# Patient Record
Sex: Male | Born: 1945 | Race: White | Hispanic: No | Marital: Married | Smoking: Never smoker
Health system: Southern US, Community
[De-identification: ages and names within clinical notes are randomized; demographics above are authoritative.]

## PROBLEM LIST (undated history)

## (undated) DIAGNOSIS — I4892 Unspecified atrial flutter: Secondary | ICD-10-CM

## (undated) DIAGNOSIS — R3129 Other microscopic hematuria: Secondary | ICD-10-CM

## (undated) DIAGNOSIS — M1990 Unspecified osteoarthritis, unspecified site: Secondary | ICD-10-CM

## (undated) DIAGNOSIS — I2510 Atherosclerotic heart disease of native coronary artery without angina pectoris: Secondary | ICD-10-CM

## (undated) DIAGNOSIS — E785 Hyperlipidemia, unspecified: Secondary | ICD-10-CM

## (undated) DIAGNOSIS — H5462 Unqualified visual loss, left eye, normal vision right eye: Secondary | ICD-10-CM

## (undated) DIAGNOSIS — M25512 Pain in left shoulder: Secondary | ICD-10-CM

## (undated) DIAGNOSIS — K219 Gastro-esophageal reflux disease without esophagitis: Secondary | ICD-10-CM

## (undated) DIAGNOSIS — B019 Varicella without complication: Secondary | ICD-10-CM

## (undated) HISTORY — DX: Pain in left shoulder: M25.512

## (undated) HISTORY — DX: Unspecified osteoarthritis, unspecified site: M19.90

## (undated) HISTORY — DX: Gastro-esophageal reflux disease without esophagitis: K21.9

## (undated) HISTORY — DX: Unspecified atrial flutter: I48.92

## (undated) HISTORY — PX: COLONOSCOPY: SHX174

## (undated) HISTORY — DX: Other microscopic hematuria: R31.29

## (undated) HISTORY — DX: Varicella without complication: B01.9

## (undated) HISTORY — DX: Unqualified visual loss, left eye, normal vision right eye: H54.62

## (undated) HISTORY — DX: Hyperlipidemia, unspecified: E78.5

---
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
## 1973-12-27 HISTORY — PX: WISDOM TOOTH EXTRACTION: SHX21

## 2013-02-02 ENCOUNTER — Encounter: Payer: Self-pay | Admitting: Family Medicine

## 2013-02-02 ENCOUNTER — Ambulatory Visit (INDEPENDENT_AMBULATORY_CARE_PROVIDER_SITE_OTHER): Payer: Managed Care, Other (non HMO) | Admitting: Family Medicine

## 2013-02-02 VITALS — BP 98/70 | HR 72 | Temp 97.5°F | Ht 69.75 in | Wt 212.0 lb

## 2013-02-02 DIAGNOSIS — Q600 Renal agenesis, unilateral: Secondary | ICD-10-CM

## 2013-02-02 DIAGNOSIS — M25519 Pain in unspecified shoulder: Secondary | ICD-10-CM

## 2013-02-02 DIAGNOSIS — Z0000 Encounter for general adult medical examination without abnormal findings: Secondary | ICD-10-CM

## 2013-02-02 DIAGNOSIS — Q602 Renal agenesis, unspecified: Secondary | ICD-10-CM

## 2013-02-02 DIAGNOSIS — E785 Hyperlipidemia, unspecified: Secondary | ICD-10-CM | POA: Insufficient documentation

## 2013-02-02 DIAGNOSIS — Z7189 Other specified counseling: Secondary | ICD-10-CM

## 2013-02-02 DIAGNOSIS — M25512 Pain in left shoulder: Secondary | ICD-10-CM

## 2013-02-02 DIAGNOSIS — Z7689 Persons encountering health services in other specified circumstances: Secondary | ICD-10-CM

## 2013-02-02 DIAGNOSIS — E782 Mixed hyperlipidemia: Secondary | ICD-10-CM | POA: Insufficient documentation

## 2013-02-02 DIAGNOSIS — R3129 Other microscopic hematuria: Secondary | ICD-10-CM

## 2013-02-02 HISTORY — DX: Other microscopic hematuria: R31.29

## 2013-02-02 HISTORY — DX: Pain in left shoulder: M25.512

## 2013-02-02 NOTE — Patient Instructions (Addendum)
-  We have ordered labs or studies at this visit. It can take up to 1-2 weeks for results and processing. We will contact you with instructions IF your results are abnormal. Normal results will be released to your [facility]. If you have not heard from [place] or can not find your results in [facility] in 2 weeks please contact our office.  -PLEASE SIGN UP FOR MYCHART TODAY   We recommend the following healthy lifestyle measures: - eat a healthy diet consisting of lots of vegetables, fruits, beans, nuts, seeds, healthy meats such as white chicken and fish and whole grains.  - avoid fried foods, fast food, processed foods, sodas, red meet and other fattening foods.  - get a least 150 minutes of aerobic exercise per week.   -We placed a referral for you as discussed to the ORTHOPEDIC doctor for your shoulder and to GASTROENTEROLOGY for a SIGMOIDOSCOPY. It usually takes about 1-2 weeks to process and schedule this referral. If you have not heard from [place] regarding this appointment in 2 weeks please contact our office.   Follow up in: on Monday for a lab appointment - please schedule this, and in 4-6 months

## 2013-02-02 NOTE — Progress Notes (Signed)
Chief Complaint  Patient presents with  . Establish Care  . left shoulder pain    HPI:  Ronnie Vazquez is here to establish care. Has not had a PCP in a long time. Last PCP and physical: had a physical at [place] about 2 years ago with basic labs all looked good.  Has the following chronic problems and concerns today:  Patient Active Problem List  Diagnosis  . 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 Left shoulder pain  . Hyperlipemia  . Microscopic hematuria   L shoulder pain: -started about 3 months ago -did injure this shoulder playing football in highschool - no surgery -hurts worse with lift arm above head and with putting coat on -denies: fevers, weakness, numbness  Health Maintenance: -see health maintenance section -colon cancer screening: wants to do sigmoid  ROS: See pertinent positives and negatives per HPI.  Past Medical History  Diagnosis Date  . Chicken pox   . Hyperlipidemia   . Congenital absence of one kidney 02/02/2013  . Microscopic hematuria 02/02/2013  . Hyperlipemia 02/02/2013  . Left shoulder pain 02/02/2013    Family History  Problem Relation Age of Onset  . Prostate cancer Father     History   Social History  . Marital Status: Married    Spouse Name: N/A    Number of Children: N/A  . Years of Education: N/A   Social History Main Topics  . Smoking status: Never Smoker   . Smokeless tobacco: None  . Alcohol Use: Yes     Comment: 2-3 drinks 3-4 days per week  . Drug Use: None  . Sexually Active: None   Other Topics Concern  . 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  EXAM:  Filed Vitals:   02/02/13 0938  BP: 98/70  Pulse: 72  Temp: 97.5 F (36.4 C)    Body mass index is 30.64 kg/(m^2).  GENERAL: vitals  reviewed and listed above, alert, oriented, appears well hydrated and in no acute distress  HEENT: atraumatic, conjunttiva clear, no obvious abnormalities on inspection of external nose and ears  NECK: no obvious masses on inspection  LUNGS: clear to auscultation bilaterally, no wheezes, rales or rhonchi, good air movement  CV: HRRR, no peripheral edema  MS: moves all extremities without noticeable abnormality -normal ROM and strength in bilat , no bony or soft tissue TTP -+ impingement test, neg shawl, empty can, lift off, speeds  PSYCH: pleasant and cooperative, no obvious depression or anxiety  ASSESSMENT AND PLAN:  Discussed the following assessment and plan:  1. Congenital absence of one kidney    2. Left shoulder pain  Ambulatory referral to Orthopedic Surgery  3. Hyperlipemia    4. Microscopic hematuria  Urinalysis with Reflex Microscopic  5. Establishing care with new doctor, encounter for  Lipid Panel, Hemoglobin A1c, Basic metabolic panel, PSA, Urinalysis with Reflex Microscopic  6. Visit for preventive health examination  Ambulatory referral to Gastroenterology   -We reviewed the PMH, PSH, FH, SH, Meds and Allergies. -discussed health maintenance recs for age and sex -pt will get sigmoidoscopy - referral placed -labs on Monday - pt wants to do fasting -discussed PSA screening and he wants to do this -placed referral for ortho for his shoulder per his request -  suspect RTC tendonopathy  -Patient advised to return or notify a doctor immediately if symptoms worsen or persist or new concerns arise.  Patient Instructions  -We have ordered labs or studies at this visit. It can take up to 1-2 weeks for results and processing. We will contact you with instructions IF your results are abnormal. Normal results will be released to your [facility]. If you have not heard from [place] or can not find your results in [facility] in 2 weeks please contact our office.  -PLEASE SIGN UP FOR  MYCHART TODAY   We recommend the following healthy lifestyle measures: - eat a healthy diet consisting of lots of vegetables, fruits, beans, nuts, seeds, healthy meats such as white chicken and fish and whole grains.  - avoid fried foods, fast food, processed foods, sodas, red meet and other fattening foods.  - get a least 150 minutes of aerobic exercise per week.   -We placed a referral for you as discussed to the ORTHOPEDIC doctor for your shoulder and to GASTROENTEROLOGY for a SIGMOIDOSCOPY. It usually takes about 1-2 weeks to process and schedule this referral. If you have not heard from [place] regarding this appointment in 2 weeks please contact our office.   Follow up in: on Monday for a lab appointment - please schedule this, and in 4-6 months      KIM, HANNAH R.

## 2013-02-05 ENCOUNTER — Telehealth: Payer: Self-pay | Admitting: Family Medicine

## 2013-02-05 ENCOUNTER — Other Ambulatory Visit (INDEPENDENT_AMBULATORY_CARE_PROVIDER_SITE_OTHER): Payer: Managed Care, Other (non HMO)

## 2013-02-05 DIAGNOSIS — Z7689 Persons encountering health services in other specified circumstances: Secondary | ICD-10-CM

## 2013-02-05 DIAGNOSIS — R3129 Other microscopic hematuria: Secondary | ICD-10-CM

## 2013-02-05 DIAGNOSIS — Z7189 Other specified counseling: Secondary | ICD-10-CM

## 2013-02-05 LAB — URINALYSIS, ROUTINE W REFLEX MICROSCOPIC
Bilirubin Urine: NEGATIVE
Ketones, ur: NEGATIVE
Nitrite: NEGATIVE
Total Protein, Urine: NEGATIVE
Urine Glucose: NEGATIVE
pH: 5.5 (ref 5.0–8.0)

## 2013-02-05 LAB — HEMOGLOBIN A1C: Hgb A1c MFr Bld: 5.6 % (ref 4.6–6.5)

## 2013-02-05 LAB — LIPID PANEL
Cholesterol: 215 mg/dL — ABNORMAL HIGH (ref 0–200)
Triglycerides: 207 mg/dL — ABNORMAL HIGH (ref 0.0–149.0)

## 2013-02-05 LAB — BASIC METABOLIC PANEL
BUN: 21 mg/dL (ref 6–23)
CO2: 28 mEq/L (ref 19–32)
Chloride: 105 mEq/L (ref 96–112)
Creatinine, Ser: 1.3 mg/dL (ref 0.4–1.5)

## 2013-02-05 LAB — LDL CHOLESTEROL, DIRECT: Direct LDL: 115.5 mg/dL

## 2013-02-05 NOTE — Telephone Encounter (Signed)
Please let him know most labs look ok.  -cholesterol is a little high and I recommend regular exercise and a healthy diet -he does have a very small amount of blood in the urine - given hx hx of one kidney and this occuring in the past, I would like for him to see a urologist.  Elease Hashimoto, Please place referral to urology if this is ok with him for microscopic hematuria, absence of one kidney

## 2013-02-06 NOTE — Telephone Encounter (Signed)
Left a message for return call.  

## 2013-02-07 ENCOUNTER — Telehealth: Payer: Self-pay | Admitting: Family Medicine

## 2013-02-07 NOTE — Telephone Encounter (Signed)
Returning call to office regarding lab results

## 2013-02-12 ENCOUNTER — Other Ambulatory Visit: Payer: Self-pay | Admitting: Family Medicine

## 2013-02-12 DIAGNOSIS — R3129 Other microscopic hematuria: Secondary | ICD-10-CM

## 2013-02-12 NOTE — Telephone Encounter (Signed)
Called and spoke with pt and pt is aware of lab results.  

## 2013-02-12 NOTE — Telephone Encounter (Signed)
Called and spoke with pt and pt is aware.  Pt would like referral to urologist.

## 2013-03-13 ENCOUNTER — Encounter: Payer: Self-pay | Admitting: Family Medicine

## 2013-03-13 NOTE — Progress Notes (Signed)
Received OV notes from Dr. 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 He was offered cytoscopy for the chronic microscopic hematuria but refused. Urine cytology was ordered and pt is to follow up with urology in 6 months.

## 2013-03-19 ENCOUNTER — Encounter: Payer: Managed Care, Other (non HMO) | Admitting: Gastroenterology

## 2013-07-29 ENCOUNTER — Telehealth: Payer: Self-pay | Admitting: Family Medicine

## 2013-07-29 ENCOUNTER — Encounter (HOSPITAL_COMMUNITY): Payer: Self-pay | Admitting: Emergency Medicine

## 2013-07-29 ENCOUNTER — Emergency Department (HOSPITAL_COMMUNITY)
Admission: EM | Admit: 2013-07-29 | Discharge: 2013-07-29 | Disposition: A | Discharge status: Home / Self Care | Payer: Managed Care, Other (non HMO) | Attending: Emergency Medicine | Admitting: Emergency Medicine

## 2013-07-29 DIAGNOSIS — Z8639 Personal history of other endocrine, nutritional and metabolic disease: Secondary | ICD-10-CM | POA: Insufficient documentation

## 2013-07-29 DIAGNOSIS — Z87798 Personal history of other (corrected) congenital malformations: Secondary | ICD-10-CM | POA: Insufficient documentation

## 2013-07-29 DIAGNOSIS — Z8619 Personal history of other infectious and parasitic diseases: Secondary | ICD-10-CM | POA: Insufficient documentation

## 2013-07-29 DIAGNOSIS — R4182 Altered mental status, unspecified: Secondary | ICD-10-CM | POA: Insufficient documentation

## 2013-07-29 DIAGNOSIS — G459 Transient cerebral ischemic attack, unspecified: Secondary | ICD-10-CM

## 2013-07-29 DIAGNOSIS — Z7982 Long term (current) use of aspirin: Secondary | ICD-10-CM | POA: Insufficient documentation

## 2013-07-29 DIAGNOSIS — Z862 Personal history of diseases of the blood and blood-forming organs and certain disorders involving the immune mechanism: Secondary | ICD-10-CM | POA: Insufficient documentation

## 2013-07-29 DIAGNOSIS — H5460 Unqualified visual loss, one eye, unspecified: Secondary | ICD-10-CM | POA: Insufficient documentation

## 2013-07-29 DIAGNOSIS — H538 Other visual disturbances: Secondary | ICD-10-CM | POA: Insufficient documentation

## 2013-07-29 LAB — COMPREHENSIVE METABOLIC PANEL
ALT: 32 U/L (ref 0–53)
AST: 32 U/L (ref 0–37)
Albumin: 4.1 g/dL (ref 3.5–5.2)
Calcium: 9.8 mg/dL (ref 8.4–10.5)
Creatinine, Ser: 1.27 mg/dL (ref 0.50–1.35)
Sodium: 139 mEq/L (ref 135–145)

## 2013-07-29 LAB — CBC
Hemoglobin: 15.1 g/dL (ref 13.0–17.0)
MCHC: 36.8 g/dL — ABNORMAL HIGH (ref 30.0–36.0)
WBC: 6.7 10*3/uL (ref 4.0–10.5)

## 2013-07-29 MED ORDER — ASPIRIN 81 MG PO CHEW
324.0000 mg | CHEWABLE_TABLET | Freq: Once | ORAL | Status: AC
  Administered 2013-07-29: 324 mg via ORAL
  Filled 2013-07-29: qty 4

## 2013-07-29 NOTE — ED Provider Notes (Signed)
CSN: [id number]     Arrival date & time 07/29/13  1529 History     First MD Initiated Contact with Patient 07/29/13 1556     Chief Complaint  Patient presents with  . Dizziness  . Blurred Vision  . Altered Mental Status    HPI Pt reports yesterday with a horizon in his vision that turned to blurred vision. Pt also reports while having a conversation with someone he became disoriented.  Patient is asymptomatic today and played golf.  Symptoms lasted maybe 1-2 minutes at most.  Patient has no risk factors other than elevated cholesterol.  Patient takes no regular medication. Past Medical History  Diagnosis Date  . Chicken pox   . Hyperlipidemia   . Congenital absence of one kidney 02/02/2013  . Microscopic hematuria 02/02/2013  . Hyperlipemia 02/02/2013  . Left shoulder pain 02/02/2013   Past Surgical History  Procedure Laterality Date  . Tonsillectomy and adenoidectomy  1959   Family History  Problem Relation Age of Onset  . Prostate cancer Father    History  Substance Use Topics  . Smoking status: Never Smoker   . Smokeless tobacco: Not on file  . Alcohol Use: Yes     Comment: 2-3 drinks 3-4 days per week    Review of Systems All other systems reviewed and are negative  Allergies  Review of patient's allergies indicates no known allergies.  Home Medications   Current Outpatient Rx  Name  Route  Sig  Dispense  Refill  . aspirin EC 81 MG tablet   Oral   Take 81 mg by mouth daily.         Marland Kitchen ibuprofen (ADVIL,MOTRIN) 200 MG tablet   Oral   Take 200 mg by mouth every 6 (six) hours as needed for pain.         Marland Kitchen OVER THE COUNTER MEDICATION   Oral   Take 1 tablet by mouth daily as needed. For allergies. [employer] brand          BP 125/78  Pulse 69  Temp(Src) 98 F (36.7 C) (Oral)  Resp 20  Ht 5' 10.5" (1.791 m)  Wt 205 lb (92.987 kg)  BMI 28.99 kg/m2  SpO2 98% Physical Exam  Nursing note and vitals reviewed. Constitutional: He is oriented to person, place, and  time. He appears well-developed and well-nourished. No distress.  HENT:  Head: Normocephalic and atraumatic.  Eyes: Pupils are equal, round, and reactive to light.  Neck: Normal range of motion. Normal carotid pulses and no JVD present. Carotid bruit is not present.  Cardiovascular: Normal rate, normal heart sounds and intact distal pulses.   Pulmonary/Chest: No respiratory distress.  Abdominal: Normal appearance. He exhibits no distension.  Musculoskeletal: Normal range of motion.  Neurological: He is alert and oriented to person, place, and time. He has normal strength. No cranial nerve deficit or sensory deficit. Coordination and gait normal. GCS eye subscore is 4. GCS verbal subscore is 5. GCS motor subscore is 6.  Skin: Skin is warm and dry. No rash noted.  Psychiatric: He has a normal mood and affect. His behavior is normal.    ED Course   Procedures (including critical care time)  Date: 07/29/2013  Rate: 77  Rhythm: normal sinus rhythm  QRS Axis: normal  Intervals: normal  ST/T Wave abnormalities: normal  Conduction Disutrbances: none  Narrative Interpretation: unremarkable     Labs Reviewed  CBC - Abnormal; Notable for the following:    MCH 34.8 (*)  MCHC 36.8 (*)    All other components within normal limits  COMPREHENSIVE METABOLIC PANEL - Abnormal; Notable for the following:    Total Bilirubin 0.2 (*)    GFR calc non Af Amer 58 (*)    GFR calc Af Amer 67 (*)    All other components within normal limits   No results found. 1. TIA (transient ischemic attack)     MDM   discussed the case with neurology.  Patient has had 24 hours with no symptoms therefore outpatient GI workup would be reasonable.  Patient does not want to be admitted.  3 iron 24 mg of aspirin given by mouth and patient recommended to start daily 324 g of aspirin.  Patient structured to call his private physician tomorrow for outpatient TIA workup.  Patient structured return to emergency  department should symptoms return.    Nelia Shi, MD 07/29/13 [phone]

## 2013-07-29 NOTE — ED Notes (Signed)
Pt reports yesterday with a horizon in his vision that turned to blurred vision. Pt also reports while having a conversation with someone he became disoriented.

## 2013-07-30 ENCOUNTER — Telehealth: Payer: Self-pay | Admitting: Family Medicine

## 2013-07-30 DIAGNOSIS — G459 Transient cerebral ischemic attack, unspecified: Secondary | ICD-10-CM

## 2013-07-30 DIAGNOSIS — H579 Unspecified disorder of eye and adnexa: Secondary | ICD-10-CM

## 2013-07-30 NOTE — Telephone Encounter (Signed)
Spoke with patient.  Had episode 2 days ago for several minutes with visual disturbance in R eye and confusion and was told to go to the ED. Symptoms resolved, went to ED and reports BP was elevated initially with SBP > 140. Told needed to take asa and do work up for cause of TIA outpt. He wants to see a neurologist urgently. He is leaving town tomorrow at noon. He reports he will see his doctor today but would appreciate if we can get him in with neurologist. Have sent urgent referral to my scheduler. No further symptoms since. Advised if any further symptoms in meantime to see doctor immediately. [phone] (home)

## 2013-07-30 NOTE — Telephone Encounter (Signed)
Patient Information:  Caller Name: Ronnie Vazquez  Phone: [phone]  Patient: Ronnie Vazquez, Ronnie Vazquez  Gender: Male  DOB: 1946-01-02  Age: 67 Years  PCP: Kriste Basque [facility]  Office Follow Up:  Does the office need to follow up with this patient?: Yes  Instructions For The Office: appt workin for ED follow up krs/can  RN Note:  Triaged for weakness Needs ED follow up appt after being seen 07/29/13 for possible TIA symptoms; labwork, ECG normal.  Diagnosed with possible TIA now stable.  Patient has 15-min follow up appt 08/03/13 but wonders if he could be seen sooner due to questions he has regarding what testing, prevention, etc he needs to think about.  TC to office for assistance; follow up appt offered 07/31/13 1615 with Dr. Selena Batten; patient is going out of town to [place] and is leaving at noon that day.  Info to office for provider review/requested workin for ED follow up appt/callback.  May reach patient at [phone].  krs/can  Symptoms  Reason For Call & Symptoms: needs follow up after ED visit  Reviewed Health History In EMR: Yes  Reviewed Medications In EMR: Yes  Reviewed Allergies In EMR: Yes  Reviewed Surgeries / Procedures: Yes  Date of Onset of Symptoms: Unknown  Guideline(s) Used:  No Protocol Available - Information Only  Disposition Per Guideline:   Discuss with PCP and Callback by Nurse Today  Reason For Disposition Reached:   Nursing judgment  Advice Given:  N/A  Patient Will Follow Care Advice:  YES

## 2013-07-30 NOTE — Telephone Encounter (Deleted)
Can block two 15 minute appts tomorrow morning for ED follow up if need to.

## 2013-07-30 NOTE — Addendum Note (Signed)
Addended by: Terressa Koyanagi on: 07/30/2013 12:34 PM   Modules accepted: Orders

## 2013-07-30 NOTE — Telephone Encounter (Signed)
[facility] Triage Call Report Triage Record Num: [id number] Operator: Chevis Pretty Patient Name: Ronnie Vazquez Call Date & Time: 07/29/2013 2:47:26PM Patient Phone: [phone] PCP: Kriste Basque Patient Gender: Male PCP Fax : Patient DOB: 1946-10-09 Practice Name: [facility]  Reason for Call: Caller: [name]/Patient; PCP: Kriste Basque (Family Practice); CB#: [phone]; Call regarding Blurry vision for a few minutes, saw a bright light. Occurred 07/28/13 of what he describes as "prism of light" with blurred vision and mild disorientation which lasted only a few minutes. Denies eye pain. States no visual disturbance or other symptoms during day 07/29/13 and played golf. States he has high cholesterol, but no blood pressure problems. Takes no medications. Per eye pain/vision change protocol, advised being seen within 4 hours. Patient agrees to go to UC/ED.  Protocol(s) Used: Eye: Pain or Vision Change  Recommended Outcome per Protocol: See Provider within 4 hours  Reason for Outcome: Sudden appearance of many floaters (dark, floating shapes), halos, spots, specks, lines, or flashes of light Care Advice: ~ Another adult should drive. ~ Do not drive or operate machinery while vision is impaired. ~ Call your provider if you note increasing pain or any change in vision (such as double vision, blurred vision, etc.) ~ Call provider if there is visual field loss, sudden decrease in visual acuity, severe eye pain, or severe headache. ~ Wear dark UV-blocking sunglasses to protect eyes from sun exposure or for light sensitivity. ~ SYMPTOM / CONDITION MANAGEMENT ~ CAUTIONS ~ List, or take, all current prescription(s), nonprescription or alternative medication(s) to provider for evaluation. 08/

## 2013-07-31 ENCOUNTER — Ambulatory Visit: Payer: Managed Care, Other (non HMO) | Admitting: Neurology

## 2013-07-31 ENCOUNTER — Ambulatory Visit (INDEPENDENT_AMBULATORY_CARE_PROVIDER_SITE_OTHER): Payer: Managed Care, Other (non HMO) | Admitting: Neurology

## 2013-07-31 ENCOUNTER — Encounter: Payer: Self-pay | Admitting: Neurology

## 2013-07-31 VITALS — BP 118/70 | HR 76 | Temp 97.5°F | Ht 70.5 in | Wt 214.0 lb

## 2013-07-31 DIAGNOSIS — H539 Unspecified visual disturbance: Secondary | ICD-10-CM

## 2013-07-31 DIAGNOSIS — G459 Transient cerebral ischemic attack, unspecified: Secondary | ICD-10-CM

## 2013-07-31 NOTE — Patient Instructions (Addendum)
A mini stroke or TIA is possible, but the type of visual disturbance (ring of light) is unusual presentation. The memory problem may have been preoccupation with this visual disturbance.  However, we should check a couple of tests to make sure there isn't anything else to do other than aspirin to help prevent a stroke.  The other possibility could have been a retinal issue with the eye. 1.  Continue the aspirin 2.  We will check a carotid doppler 3.  We will check an echocardiogram 4.  Follow up with ophthalmology.  Your carotid doppler study is scheduled on Monday, August 11th at 2:00 pm.  Your 2 D Echo is scheduled on Thursday, August 14th at 9:30 am. Please arrive 15 minutes early for both appointments. Both tests will be performed at [facility] located at [address] in [place].  [phone]

## 2013-07-31 NOTE — Progress Notes (Signed)
NEUROLOGY CONSULTATION NOTE  Ronnie Vazquez MRN: [id number] DOB: 1945-12-30  Referring provider: Dr. Selena Batten Primary care provider: Dr. Selena Batten  Reason for consult:  Transient episode of visual disturbance and memory problem.  Questionable TIA.  HISTORY OF PRESENT ILLNESS: Ronnie Vazquez is a 67 y.o. Male with no significant past medical history, who presents with transient episode of right visual disturbance and memory problems.  Records and images were personally reviewed where available.    On 07/28/13, he was eating lunch with friends when he suddenly noted a ring of light in the visual field of his right eye, which was associated with blurriness. There was no darkening of vision like a shade coming down. He denied any numbness or tingling or slurred speech. He was talking about [place] with his friends. During this period, he was unable to remember the names of cities in [place]. It lasted only about a minute and completely resolved.  He presented to the ED the following day.  Blood pressure was 143/94, but later was 117/75.  No imaging was performed.  Admission for TIA workup was proposed, but he did not want to be admitted for further workup.  He was told to increase his ASA from 81mg  to 325mg  daily and follow up with his PCP.  At baseline, he has vision loss in his left eye.  Labs (02/05/13):  LDL 41.4, Hgb A1c 5.6.  PAST MEDICAL HISTORY: Past Medical History  Diagnosis Date  . Chicken pox   . Hyperlipidemia   . Congenital absence of one kidney 02/02/2013  . Microscopic hematuria 02/02/2013  . Hyperlipemia 02/02/2013  . Left shoulder pain 02/02/2013  . Vision loss of left eye     PAST SURGICAL HISTORY: Past Surgical History  Procedure Laterality Date  . Tonsillectomy and adenoidectomy  1959    MEDICATIONS: Current Outpatient Prescriptions on File Prior to Visit  Medication Sig Dispense Refill  . ibuprofen (ADVIL,MOTRIN) 200 MG tablet Take 200 mg by mouth every 6 (six) hours as needed for  pain.      Marland Kitchen OVER THE COUNTER MEDICATION Take 1 tablet by mouth daily as needed. For allergies. [employer] brand       No current facility-administered medications on file prior to visit.    ALLERGIES: No Known Allergies  FAMILY HISTORY: Family History  Problem Relation Age of Onset  . Prostate cancer Father     SOCIAL HISTORY: History   Social History  . Marital Status: Married    Spouse Name: N/A    Number of Children: N/A  . Years of Education: N/A   Occupational History  . Not on file.   Social History Main Topics  . Smoking status: Never Smoker   . Smokeless tobacco: Never Used  . Alcohol Use: Yes     Comment: 2-3 drinks 3-4 days per week  . Drug Use: No  . Sexually Active: Not on file   Other Topics Concern  . 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 Situation: lives with wife      Spiritual Beliefs: none      Lifestyle: exercises 3 times per week                REVIEW OF SYSTEMS: Constitutional: No fevers, chills, or sweats, no generalized fatigue, change in appetite Eyes: No visual changes, double vision, eye pain Ear, nose and throat: No hearing loss, ear  pain, nasal congestion, sore throat Cardiovascular: No chest pain, palpitations Respiratory:  No shortness of breath at rest or with exertion, wheezes GastrointestinaI: No nausea, vomiting, diarrhea, abdominal pain, fecal incontinence Genitourinary:  No dysuria, urinary retention or frequency Musculoskeletal:  Shoulder pain Integumentary: No rash, pruritus, skin lesions Neurological: as above Psychiatric: No depression, insomnia, anxiety Endocrine: No palpitations, fatigue, diaphoresis, mood swings, change in appetite, change in weight, increased thirst Hematologic/Lymphatic:  No anemia, purpura, petechiae. Allergic/Immunologic: no itchy/runny eyes, nasal congestion, recent allergic reactions, rashes  PHYSICAL  EXAM: Filed Vitals:   07/31/13 1237  BP: 118/70  Pulse: 76  Temp: 97.5 F (36.4 C)   General: No acute distress Head:  Normocephalic/atraumatic Neck: supple, no paraspinal tenderness, full range of motion Back: No paraspinal tenderness Heart: regular rate and rhythm Lungs: Clear to auscultation bilaterally. Vascular: No carotid bruits. Neurological Exam: Mental status: alert and oriented to person, place, time and self, speech fluent and not dysarthric, language intact. Cranial nerves: CN I: not tested CN II: right eye round and reactive to light, left pupil nonreactive, visual fields intact, fundi unremarkable. CN III, IV, VI:  full range of motion, no nystagmus, no ptosis CN V: facial sensation intact CN VII: upper and lower face symmetric CN VIII: hearing intact CN IX, X: gag intact, uvula midline CN XI: sternocleidomastoid and trapezius muscles intact CN XII: tongue midline Bulk & Tone: normal, no fasciculations. Motor: 5/5 throughout Sensation: temperature and vibration intact Deep Tendon Reflexes: 2+ throughout, toes down Finger to nose testing: Normal without dysmetria Gait: Normal stance and stride, able to walk in tandem. Romberg negative.  IMPRESSION & PLAN: Ronnie Vazquez is a 67 y.o. male with brief episode of right eye visual disturbance and memory problems.  I am not really convinced that he had a TIA.  The visual disturbance he describes is not characteristic for amaurosis fugax.  The difficulty recalling the cities in [place] may have been due to preoccupation with this visual disturbance, rather than organic memory deficits.  The description of his visual disturbance sounds like it may be due to his retina.  Nevertheless, we will check a couple of tests in an effort to optimize secondary stroke prevention. 1.  2D Echo and carotid dopplers 2.  Continue ASA. 3.  Follow up with ophthalmology, as scheduled for Monday. 4.  Further workup pending results of above  test.  Otherwise, no further neurological workup or follow up warranted.  45 minutes spent with patient, over 50% spent counseling and coordinating care.  Thank you for allowing me to take part in the care of this patient.  Shon Millet, DO  CC:  Kriste Basque, MD

## 2013-08-03 ENCOUNTER — Ambulatory Visit: Payer: Managed Care, Other (non HMO) | Admitting: Family Medicine

## 2013-08-06 ENCOUNTER — Encounter (INDEPENDENT_AMBULATORY_CARE_PROVIDER_SITE_OTHER): Payer: Managed Care, Other (non HMO)

## 2013-08-06 DIAGNOSIS — I6529 Occlusion and stenosis of unspecified carotid artery: Secondary | ICD-10-CM

## 2013-08-06 DIAGNOSIS — H53129 Transient visual loss, unspecified eye: Secondary | ICD-10-CM

## 2013-08-06 DIAGNOSIS — H539 Unspecified visual disturbance: Secondary | ICD-10-CM

## 2013-08-06 DIAGNOSIS — G459 Transient cerebral ischemic attack, unspecified: Secondary | ICD-10-CM

## 2013-08-07 ENCOUNTER — Telehealth: Payer: Self-pay | Admitting: Neurology

## 2013-08-07 NOTE — Telephone Encounter (Signed)
Message copied by Benay Spice on Tue Aug 07, 2013  3:22 PM ------      Message from: JAFFE, ADAM R      Created: Tue Aug 07, 2013  1:20 PM       Please let [name] know that his carotid dopplers look okay.  There is mild plaque build up in carotid arteries, but nothing significant.      AJ      ----- Message -----         From: Wendall Stade, MD         Sent: 08/07/2013  12:54 PM           To: Cira Servant, DO                   ------

## 2013-08-07 NOTE — Telephone Encounter (Signed)
Left the patient a message stating his carotid doppler study was okay with only very mild plaque noted; not significant. Asked that he call if questions.

## 2013-08-09 ENCOUNTER — Other Ambulatory Visit (HOSPITAL_COMMUNITY): Payer: Managed Care, Other (non HMO)

## 2013-08-13 ENCOUNTER — Ambulatory Visit (HOSPITAL_COMMUNITY): Payer: Managed Care, Other (non HMO) | Attending: Neurology

## 2013-08-13 DIAGNOSIS — E785 Hyperlipidemia, unspecified: Secondary | ICD-10-CM | POA: Insufficient documentation

## 2013-08-13 DIAGNOSIS — G459 Transient cerebral ischemic attack, unspecified: Secondary | ICD-10-CM

## 2013-08-13 NOTE — Progress Notes (Signed)
Echocardiogram performed.  

## 2013-08-14 ENCOUNTER — Telehealth: Payer: Self-pay | Admitting: Neurology

## 2013-08-14 NOTE — Telephone Encounter (Signed)
Left the patient a vm message that 2D Echo was normal; no significant findings. Asked that he call if questions.

## 2013-08-14 NOTE — Telephone Encounter (Signed)
Message copied by Benay Spice on Tue Aug 14, 2013  8:41 AM ------      Message from: JAFFE, ADAM R      Created: Mon Aug 13, 2013  2:13 PM       Please let Mr. Wenrich know that his 2D Echo looks okay.      AJ      ----- Message -----         From: [facility]         Sent: 08/13/2013   2:06 PM           To: Cira Servant, DO                   ------

## 2013-08-24 ENCOUNTER — Encounter: Payer: Self-pay | Admitting: Family Medicine

## 2013-08-24 ENCOUNTER — Ambulatory Visit (INDEPENDENT_AMBULATORY_CARE_PROVIDER_SITE_OTHER): Payer: Managed Care, Other (non HMO) | Admitting: Family Medicine

## 2013-08-24 VITALS — BP 104/70 | Temp 98.2°F | Wt 210.0 lb

## 2013-08-24 DIAGNOSIS — Z23 Encounter for immunization: Secondary | ICD-10-CM

## 2013-08-24 DIAGNOSIS — Z2911 Encounter for prophylactic immunotherapy for respiratory syncytial virus (RSV): Secondary | ICD-10-CM

## 2013-08-24 DIAGNOSIS — R3129 Other microscopic hematuria: Secondary | ICD-10-CM

## 2013-08-24 DIAGNOSIS — Z1211 Encounter for screening for malignant neoplasm of colon: Secondary | ICD-10-CM

## 2013-08-24 NOTE — Progress Notes (Addendum)
Chief Complaint  Patient presents with  . 6 month follow up    HPI:  Follow up:  1)Transient los of vision: -saw neurology and had echo and carotid dopplers which were ok per phone notes and not thought to be TIA, advised to continue asa which he has done -saw optho and was fine too -no symptoms since  2)microscopic hematuria: -saw urology, CT ok, cystoscopy was advised but deferred by pt -pt was to follow up with urology in 6 months per review of notes  Health maintenance: -due for flu and shingles vaccines -referred for sigmoidoscopy - he cancelled sigmoid and is interested in colo vantage or stool cards yearly  ROS: See pertinent positives and negatives per HPI.  Past Medical History  Diagnosis Date  . Chicken pox   . Hyperlipidemia   . Microscopic hematuria 02/02/2013  . Hyperlipemia 02/02/2013  . Left shoulder pain 02/02/2013  . Vision loss of left eye     Past Surgical History  Procedure Laterality Date  . Tonsillectomy and adenoidectomy  1959    Family History  Problem Relation Age of Onset  . Prostate cancer Father     History   Social History  . Marital Status: Married    Spouse Name: N/A    Number of Children: N/A  . Years of Education: N/A   Social History Main Topics  . Smoking status: Never Smoker   . Smokeless tobacco: Never Used  . Alcohol Use: Yes     Comment: 2-3 drinks 3-4 days per week  . Drug Use: No  . Sexual Activity: Not on file   Other Topics Concern  . 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 For allergies. [employer] brand, Disp: , Rfl:   EXAM:  Filed Vitals:   08/24/13 1521  BP: 104/70  Temp: 98.2 F (36.8 C)    Body mass index is 30.69 kg/(m^2).  GENERAL: vitals reviewed and listed above, alert, oriented, appears well hydrated and in no acute distress  HEENT: atraumatic, conjunttiva clear, no obvious abnormalities on inspection of external nose and ears  NECK: no obvious masses on inspection  LUNGS: clear to auscultation bilaterally, no wheezes, rales or rhonchi, good air movement  CV: HRRR, no peripheral edema  MS: moves all extremities without noticeable abnormality  PSYCH: pleasant and cooperative, no obvious depression or anxiety  ASSESSMENT AND PLAN:  Discussed the following assessment and plan:  Colon cancer screening  Microscopic hematuria, evaluated by urology 02/2013  -he reports has follow up with urology -he refuses colonoscopy or sigmoidoscopy, discussed other options including colovantage and he agrees to stool cards -he refuses flu vaccine -agrees to shingles vaccine - given today -Patient advised to return or notify a doctor immediately if symptoms worsen or persist or new concerns arise.  Patient Instructions  -follow up with urologist  -stool cards given  today  -shingles vaccine given today  -follow up in 1 year or as needed     KIM, HANNAH R.

## 2013-08-24 NOTE — Addendum Note (Signed)
Addended by: Azucena Freed on: 08/24/2013 04:05 PM   Modules accepted: Orders

## 2013-08-24 NOTE — Patient Instructions (Signed)
-  follow up with urologist  -stool cards given today  -shingles vaccine given today  -follow up in 1 year or as needed

## 2013-08-29 ENCOUNTER — Ambulatory Visit (INDEPENDENT_AMBULATORY_CARE_PROVIDER_SITE_OTHER): Payer: Managed Care, Other (non HMO) | Admitting: Family Medicine

## 2013-08-29 ENCOUNTER — Encounter: Payer: Self-pay | Admitting: Family Medicine

## 2013-08-29 VITALS — BP 94/60 | Temp 98.2°F | Wt 210.0 lb

## 2013-08-29 DIAGNOSIS — J329 Chronic sinusitis, unspecified: Secondary | ICD-10-CM

## 2013-08-29 MED ORDER — AMOXICILLIN 875 MG PO TABS: 875.0000 mg | ORAL_TABLET | Freq: Two times a day (BID) | ORAL | Status: DC

## 2013-08-29 NOTE — Progress Notes (Signed)
Chief Complaint  Patient presents with  . sinus pressure    and pain; weakness, joint pain, mucus- brownish color     HPI:  Rad Gramling is here for an acute visit for URI: -started: 6 days ago -symptoms:nasal congestion , drainage, sore throat, sweats, malaise, ear and sinus pain -denies:fevers, NVD -wife with same symptoms -has tried: he took some ampicillin for last 3 days and now feeling better  ROS: See pertinent positives and negatives per HPI.  Past Medical History  Diagnosis Date  . Chicken pox   . Hyperlipidemia   . Microscopic hematuria 02/02/2013  . Hyperlipemia 02/02/2013  . Left shoulder pain 02/02/2013  . Vision loss of left eye     Past Surgical History  Procedure Laterality Date  . Tonsillectomy and adenoidectomy  1959    Family History  Problem Relation Age of Onset  . Prostate cancer Father     History   Social History  . Marital Status: Married    Spouse Name: N/A    Number of Children: N/A  . Years of Education: N/A   Social History Main Topics  . Smoking status: Never Smoker   . Smokeless tobacco: Never Used  . Alcohol Use: Yes     Comment: 2-3 drinks 3-4 days per week  . Drug Use: No  . Sexual Activity: None   Other Topics Concern  . 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 For allergies. [employer] brand, Disp: , Rfl: ;  amoxicillin (AMOXIL) 875 MG tablet, Take 1 tablet (875 mg total) by mouth 2 (two) times daily., Disp: 14 tablet, Rfl: 0  EXAM:  Filed Vitals:   08/29/13  1104  BP: 94/60  Temp: 98.2 F (36.8 C)    Body mass index is 29.7 kg/(m^2).  GENERAL: vitals reviewed and listed above, alert, oriented, appears well hydrated and in no acute distress  HEENT: atraumatic, conjunttiva clear, no obvious abnormalities on inspection of external nose and ears  NECK: no obvious masses on inspection  LUNGS: clear to auscultation bilaterally, no wheezes, rales or rhonchi, good air movement  CV: HRRR, no peripheral edema  MS: moves all extremities without noticeable abnormality  PSYCH: pleasant and cooperative, no obvious depression or anxiety  ASSESSMENT AND PLAN:  Discussed the following assessment and plan:  Sinusitis - Plan: amoxicillin (AMOXIL) 875 MG tablet  -seems to be improving, but getting ready to go on trip - abx given in case worsens - risks discussed -Patient advised to return or notify a doctor immediately if symptoms worsen or persist or new concerns arise.  There are no Patient Instructions on file for this visit.   Kriste Basque R.

## 2013-08-29 NOTE — Patient Instructions (Addendum)

## 2013-10-16 ENCOUNTER — Ambulatory Visit (INDEPENDENT_AMBULATORY_CARE_PROVIDER_SITE_OTHER): Payer: Managed Care, Other (non HMO) | Admitting: Family Medicine

## 2013-10-16 ENCOUNTER — Encounter: Payer: Self-pay | Admitting: Family Medicine

## 2013-10-16 VITALS — BP 118/80 | HR 70 | Temp 98.5°F | Wt 212.0 lb

## 2013-10-16 DIAGNOSIS — J209 Acute bronchitis, unspecified: Secondary | ICD-10-CM

## 2013-10-16 MED ORDER — AZITHROMYCIN 250 MG PO TABS: ORAL_TABLET | ORAL | Status: DC

## 2013-10-16 NOTE — Progress Notes (Signed)
  Subjective:    Patient ID: Ronnie Vazquez, male    DOB: 1946/03/17, 67 y.o.   MRN: [id number]  HPI Here for continued URI symptoms that started about 7 weeks ago. He was seen on 08-29-13 and was given a rx of Amoxicillin, but he never got this filled. He has had sinus pressure, PND, chest congestion, and is now coughing up green sputum. No fever. His wife has similar sx.    Review of Systems  Constitutional: Negative.   HENT: Positive for congestion and postnasal drip.   Eyes: Negative.   Respiratory: Positive for cough and chest tightness. Negative for apnea, shortness of breath and wheezing.        Objective:   Physical Exam  Constitutional: He appears well-developed and well-nourished.  HENT:  Right Ear: External ear normal.  Left Ear: External ear normal.  Nose: Nose normal.  Mouth/Throat: Oropharynx is clear and moist.  Eyes: Conjunctivae are normal.  Pulmonary/Chest: Effort normal. No respiratory distress. He has no wheezes. He has no rales.  Scattered rhonchi   Lymphadenopathy:    He has no cervical adenopathy.          Assessment & Plan:  Try a Zpack and Mucinex.

## 2014-01-03 ENCOUNTER — Encounter: Payer: Self-pay | Admitting: Family Medicine

## 2014-01-03 NOTE — Progress Notes (Signed)
Received notes from [facility] from visit on 12/26/2013.  Pt seen for abnormal urine cytology, microscopic hematuria, renal hypoplasia, unilateral, and urinary urgency.  Atypical urothelial cells are present, RBC's present.  On CT Abdomen an Pelvis w/o contrast:  2 mm nonobstructive calculus in lower pole collecting system of the right kidney.  Small 1.5 cm parapelvic cyst in the upper pole collecting system of the left kidney, colonic diverticulosis without findings to suggest acute diverticulitis at this time.  Physician to get a cytology with reflex and if the Jenkins is positive, he will insiston cystoscopy.  If the FISH is negative, pt will return in 6 months for a UA.  Sent to scan.

## 2014-03-25 ENCOUNTER — Telehealth: Payer: Self-pay | Admitting: Family Medicine

## 2014-03-25 NOTE — Telephone Encounter (Signed)
Can see [place] or derm no referral needed for derm - you can give him numbers, but we are happy to see him for this.

## 2014-03-25 NOTE — Telephone Encounter (Signed)
Pt states he has 2 dry spots on his arm/wrist and feels they may need to be looked at. Pt doesn't know any derm in [place], do you want to see pt first, or refer?

## 2014-03-25 NOTE — Telephone Encounter (Signed)
Called and left a message for pt to return call.  

## 2014-03-26 NOTE — Telephone Encounter (Signed)
Spoke with pt's wife and she is aware of Dermatology offices in [place] and was given the information.

## 2014-06-26 ENCOUNTER — Encounter: Payer: Self-pay | Admitting: *Deleted

## 2014-08-27 ENCOUNTER — Other Ambulatory Visit (HOSPITAL_COMMUNITY): Payer: Self-pay | Admitting: Cardiology

## 2014-08-27 DIAGNOSIS — I6529 Occlusion and stenosis of unspecified carotid artery: Secondary | ICD-10-CM

## 2014-08-30 ENCOUNTER — Ambulatory Visit (HOSPITAL_COMMUNITY): Payer: Managed Care, Other (non HMO) | Attending: Cardiology | Admitting: Radiology

## 2014-08-30 DIAGNOSIS — I6529 Occlusion and stenosis of unspecified carotid artery: Secondary | ICD-10-CM

## 2014-08-30 DIAGNOSIS — I658 Occlusion and stenosis of other precerebral arteries: Secondary | ICD-10-CM | POA: Diagnosis not present

## 2014-08-30 DIAGNOSIS — Z8673 Personal history of transient ischemic attack (TIA), and cerebral infarction without residual deficits: Secondary | ICD-10-CM | POA: Diagnosis not present

## 2014-08-30 DIAGNOSIS — E785 Hyperlipidemia, unspecified: Secondary | ICD-10-CM | POA: Insufficient documentation

## 2014-08-30 NOTE — Progress Notes (Signed)
Carotid Duplex performed. 

## 2015-08-29 ENCOUNTER — Telehealth: Payer: Self-pay | Admitting: Family Medicine

## 2015-08-29 NOTE — Telephone Encounter (Signed)
Pt not seen in over 2 yrs. Needs cpe . The first available is Tues 12/23/15 for fasting cpe.  Pt will be traveling that week. Pt states appt must be on a Monday or Friday b/c he travels during the week.  Is it ok to work in first of December?

## 2015-08-29 NOTE — Telephone Encounter (Signed)
Ok

## 2015-09-03 NOTE — Telephone Encounter (Signed)
Lm on vm to cb °

## 2015-09-25 NOTE — Telephone Encounter (Signed)
Pt has been scheduled.  °

## 2015-12-01 ENCOUNTER — Encounter: Payer: Self-pay | Admitting: Family Medicine

## 2015-12-01 ENCOUNTER — Ambulatory Visit (INDEPENDENT_AMBULATORY_CARE_PROVIDER_SITE_OTHER): Payer: Managed Care, Other (non HMO) | Admitting: Family Medicine

## 2015-12-01 VITALS — BP 118/80 | HR 68 | Temp 98.1°F | Ht 69.75 in | Wt 203.3 lb

## 2015-12-01 DIAGNOSIS — E785 Hyperlipidemia, unspecified: Secondary | ICD-10-CM

## 2015-12-01 DIAGNOSIS — R3129 Other microscopic hematuria: Secondary | ICD-10-CM | POA: Diagnosis not present

## 2015-12-01 DIAGNOSIS — Z0000 Encounter for general adult medical examination without abnormal findings: Secondary | ICD-10-CM

## 2015-12-01 DIAGNOSIS — I6523 Occlusion and stenosis of bilateral carotid arteries: Secondary | ICD-10-CM | POA: Diagnosis not present

## 2015-12-01 DIAGNOSIS — G459 Transient cerebral ischemic attack, unspecified: Secondary | ICD-10-CM

## 2015-12-01 DIAGNOSIS — Z23 Encounter for immunization: Secondary | ICD-10-CM

## 2015-12-01 LAB — URINALYSIS, ROUTINE W REFLEX MICROSCOPIC
BILIRUBIN URINE: NEGATIVE
KETONES UR: NEGATIVE
Leukocytes, UA: NEGATIVE
Nitrite: NEGATIVE
SPECIFIC GRAVITY, URINE: 1.02 (ref 1.000–1.030)
Total Protein, Urine: NEGATIVE
UROBILINOGEN UA: 0.2 (ref 0.0–1.0)
Urine Glucose: NEGATIVE
WBC UA: NONE SEEN (ref 0–?)
pH: 5.5 (ref 5.0–8.0)

## 2015-12-01 LAB — LDL CHOLESTEROL, DIRECT: LDL DIRECT: 128 mg/dL

## 2015-12-01 LAB — LIPID PANEL
Cholesterol: 233 mg/dL — ABNORMAL HIGH (ref 0–200)
HDL: 41.3 mg/dL (ref >39.00)
NONHDL: 191.52
Total CHOL/HDL Ratio: 6
Triglycerides: 259 mg/dL — ABNORMAL HIGH (ref 0.0–149.0)
VLDL: 51.8 mg/dL — ABNORMAL HIGH (ref 0.0–40.0)

## 2015-12-01 LAB — COMPLETE METABOLIC PANEL WITH GFR
ALT: 24 U/L (ref 9–46)
AST: 27 U/L (ref 10–35)
Albumin: 4.3 g/dL (ref 3.6–5.1)
Alkaline Phosphatase: 36 U/L — ABNORMAL LOW (ref 40–115)
BUN: 24 mg/dL (ref 7–25)
CHLORIDE: 102 mmol/L (ref 98–110)
CO2: 26 mmol/L (ref 20–31)
CREATININE: 1.23 mg/dL (ref 0.70–1.25)
Calcium: 9.5 mg/dL (ref 8.6–10.3)
GFR, EST AFRICAN AMERICAN: 70 mL/min (ref >=60)
GFR, Est Non African American: 60 mL/min (ref >=60)
GLUCOSE: 85 mg/dL (ref 65–99)
Potassium: 4.4 mmol/L (ref 3.5–5.3)
SODIUM: 136 mmol/L (ref 135–146)
Total Bilirubin: 0.5 mg/dL (ref 0.2–1.2)
Total Protein: 6.7 g/dL (ref 6.1–8.1)

## 2015-12-01 LAB — HEMOGLOBIN A1C: HEMOGLOBIN A1C: 5.5 % (ref 4.6–6.5)

## 2015-12-01 NOTE — Addendum Note (Signed)
Addended by: Agnes Lawrence on: 12/01/2015 10:04 AM   Modules accepted: Orders

## 2015-12-01 NOTE — Progress Notes (Signed)
Pre visit review using our clinic review tool, if applicable. No additional management support is needed unless otherwise documented below in the visit note. 

## 2015-12-01 NOTE — Progress Notes (Addendum)
Ronnie Vazquez is a pleasant 69 yo, here for his annual wellness preventive care visit. He has not been in for a visit in several years. He reports: Chronic medical problems:  Hx microscopic Hematuria: -Urology mananging -reports: wants to check urine micro here, reports longstanding microscopic hematuria his whole life -denies:flank pain, gross hematuria, voiding issues  Hx ? TIA/corotid art stenosis: -remote, brief visual changes -extensive eval at the time with optho and neurologist -on asa daily since -saw neurologist, reports was supposed to get repeat carotid dopplers but had to cancel, agrees to reschedule -denies HA, vision changes (saw optho recently per his report), CP, SOB, dizziness  Mild HLD: -lifestyle: exercises 3 days per week, diet so so -denies: CP, SOB, DOE   ROS: negative for report of fevers, unintentional weight loss, vision changes, vision loss, hearing loss or change, chest pain, sob, hemoptysis, melena, hematochezia, hematuria, genital discharge or lesions, falls, bleeding or bruising, loc, thoughts of suicide or self harm, memory loss  1.) Patient-completed health risk assessment  - completed and reviewed, see scanned documentation  2.) Review of Medical History: -PMH, PSH, Family History and current specialty and care providers reviewed and updated and listed below  - see scanned in document in chart and below  Past Medical History  Diagnosis Date  . Chicken pox   . Hyperlipidemia   . Microscopic hematuria 02/02/2013  . Hyperlipemia 02/02/2013  . Left shoulder pain 02/02/2013  . Vision loss of left eye     Past Surgical History  Procedure Laterality Date  . Tonsillectomy and adenoidectomy  1959    Social History   Social History  . Marital Status: Married    Spouse Name: N/A  . Number of Children: N/A  . Years of Education: N/A   Occupational History  . Not on file.   Social History Main Topics  . Smoking status: Never Smoker   . Smokeless  tobacco: Never Used  . Alcohol Use: Yes     Comment: 2-3 drinks 3-4 days per week  . Drug Use: No  . Sexual Activity: Not on file   Other Topics Concern  . 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 Prostate cancer Father     Current Outpatient Prescriptions on File Prior to Visit  Medication Sig Dispense Refill  . ibuprofen (ADVIL,MOTRIN) 200 MG tablet Take 200 mg by mouth every 6 (six) hours as needed for pain.    Marland Kitchen OVER THE COUNTER MEDICATION Take 1 tablet by mouth daily as needed. For allergies. [employer] brand     No current facility-administered medications on file prior to visit.     3.) Review of functional ability and level of safety:  Any difficulty hearing?  NO  History of falling? YES  - x1 tripped, mechanical, no injury  Any trouble with IADLs - using a phone, using transportation, grocery shopping, preparing meals, doing housework, doing laundry, taking medications and managing money? NO  Advance Directives? NO  See summary of recommendations in Patient Instructions below.  4.) Physical Exam Filed Vitals:   12/01/15 0858  BP: 118/80  Pulse: 68  Temp: 98.1 F (36.7 C)   Estimated body mass index is 29.37 kg/(m^2) as calculated from the following:   Height as of this encounter: 5' 9.75" (1.772 m).   Weight as of this encounter: 203 lb 4.8 oz (92.216 kg).  EKG (optional): deferred  General: alert, appear well hydrated and in no acute distress  HEENT: visual acuity grossly intact  NECK: no carotid bruit, no JVD  CV: HRRR, no peripheral edema  Lungs: CTA bilaterally  Psych: pleasant and cooperative, no obvious depression or anxiety  Cognitive function grossly intact  See patient instructions for  recommendations.  Education and counseling regarding the above review of health provided with a plan for the following: -see scanned patient completed form for further details -fall prevention strategies discussed  -healthy lifestyle discussed -importance and resources for completing advanced directives discussed -see patient instructions below for any other recommendations provided  4)The following written screening schedule of preventive measures were reviewed with assessment and plan made per below, orders and patient instructions:      AAA screening n/a      Alcohol screening done     Obesity Screening and counseling done     STI screening (Hep C if born 35-65) declined     Tobacco Screening done       Pneumococcal (PPSV23 -one dose after 64, one before if risk factors), influenza yearly and hepatitis B vaccines (if high risk - end stage renal disease, IV drugs, homosexual men, live in home for mentally retarded, hemophilia receiving factors) ASSESSMENT/PLAN: prevnar 1 today      Screening mammograph (yearly if >40) ASSESSMENT/PLAN: n/a      Screening Pap smear/pelvic exam (q2 years) ASSESSMENT/PLAN: n/a      Prostate cancer screening ASSESSMENT/PLAN: sees urology      Colorectal cancer screening (FOBT yearly or flex sig q4y or colonoscopy q10y or barium enema q4y) ASSESSMENT/PLAN: declined all options other then stool cards - given      Diabetes outpatient self-management training services ASSESSMENT/PLAN: n/a      Bone mass measurements(covered q2y if indicated - estrogen def, osteoporosis, hyperparathyroid, vertebral abnormalities, osteoporosis or steroids) ASSESSMENT/PLAN: n/a      Screening for glaucoma(q1y if high risk - diabetes, FH, AA and > 50 or hispanic and > 65) ASSESSMENT/PLAN: sees optho yearly      Medical nutritional therapy for individuals with diabetes or renal disease ASSESSMENT/PLAN: n/a      Cardiovascular screening blood tests (lipids  q5y) ASSESSMENT/PLAN: today      Diabetes screening tests ASSESSMENT/PLAN: today   7.) Summary:  Annual Preventive care visit: -risk factors and conditions per above assessment were discussed and treatment, recommendations and referrals were offered per documentation above and orders and patient instructions.  Hyperlipemia - Plan: Lipid Panel, CMP with eGFR -advised statin if elevated given history  Microscopic hematuria, evaluated by urology 02/2013 - Plan: Urinalysis with Reflex Microscopic -advised follow up with urologist, agreed to check urine today  Transient cerebral ischemia, unspecified transient cerebral ischemia type -? Of, no symptoms, advised he repeat carotid duplex and he agreed to reschedule -advised consideration statin if cholesterol elevated  Carotid artery stenosis, bilateral - 1-39% in 2015 - Plan: Lipid Panel, Hemoglobin A1c, CMP with eGFR -see above  Patient Instructions  BEFORE YOU LEAVE: -prevnar 13 and flu vaccines -labs -follow up in 1 year  -We have ordered labs or studies at this visit. It can take up to 1-2 weeks for results and processing. We will contact you  with instructions IF your results are abnormal. Normal results will be released to your [facility]. If you have not heard from [place] or can not find your results in [facility] in 2 weeks please contact our office.  We recommend the following healthy lifestyle measures: - eat a healthy whole foods diet consisting of regular small meals composed of vegetables, fruits, beans, nuts, seeds, healthy meats such as white chicken and fish and whole grains.  - avoid sweets, white starchy foods, fried foods, fast food, processed foods, sodas, red meet and other fattening foods.  - get a least 150-300 minutes of aerobic exercise per week.   Schedule carotid artery follow up exam  Please see a lawyer and/or go to this website to help you with advanced directives and designating a health care power of attorney so  that your wishes will be followed should you become too ill to make your own medical decisions.  [email or web address]  Complete the stool cards and return

## 2015-12-01 NOTE — Patient Instructions (Addendum)
BEFORE YOU LEAVE: -prevnar 13 and flu vaccines -labs -follow up in 1 year  -We have ordered labs or studies at this visit. It can take up to 1-2 weeks for results and processing. We will contact you with instructions IF your results are abnormal. Normal results will be released to your [facility]. If you have not heard from [place] or can not find your results in [facility] in 2 weeks please contact our office.  We recommend the following healthy lifestyle measures: - eat a healthy whole foods diet consisting of regular small meals composed of vegetables, fruits, beans, nuts, seeds, healthy meats such as white chicken and fish and whole grains.  - avoid sweets, white starchy foods, fried foods, fast food, processed foods, sodas, red meet and other fattening foods.  - get a least 150-300 minutes of aerobic exercise per week.   Schedule carotid artery follow up exam  Please see a lawyer and/or go to this website to help you with advanced directives and designating a health care power of attorney so that your wishes will be followed should you become too ill to make your own medical decisions.  [email or web address]  Complete the stool cards and return

## 2015-12-23 ENCOUNTER — Encounter: Payer: Managed Care, Other (non HMO) | Admitting: Family Medicine

## 2016-04-05 ENCOUNTER — Ambulatory Visit (INDEPENDENT_AMBULATORY_CARE_PROVIDER_SITE_OTHER): Payer: Managed Care, Other (non HMO) | Admitting: Family Medicine

## 2016-04-05 ENCOUNTER — Encounter: Payer: Self-pay | Admitting: Family Medicine

## 2016-04-05 VITALS — BP 118/62 | HR 67 | Temp 98.2°F | Ht 69.75 in | Wt 203.3 lb

## 2016-04-05 DIAGNOSIS — I739 Peripheral vascular disease, unspecified: Secondary | ICD-10-CM

## 2016-04-05 DIAGNOSIS — I779 Disorder of arteries and arterioles, unspecified: Secondary | ICD-10-CM | POA: Diagnosis not present

## 2016-04-05 DIAGNOSIS — E785 Hyperlipidemia, unspecified: Secondary | ICD-10-CM

## 2016-04-05 DIAGNOSIS — G459 Transient cerebral ischemic attack, unspecified: Secondary | ICD-10-CM

## 2016-04-05 LAB — LIPID PANEL
CHOL/HDL RATIO: 6
CHOLESTEROL: 247 mg/dL — AB (ref 0–200)
HDL: 40.2 mg/dL (ref >39.00)
NONHDL: 206.75
Triglycerides: 323 mg/dL — ABNORMAL HIGH (ref 0.0–149.0)
VLDL: 64.6 mg/dL — AB (ref 0.0–40.0)

## 2016-04-05 LAB — LDL CHOLESTEROL, DIRECT: Direct LDL: 102 mg/dL

## 2016-04-05 NOTE — Patient Instructions (Addendum)
Before you leave: -labs -wellness visit in December or January  Please follow up with your neurologist about repeat dopplers of your carotid arteries if you have not already done so.  We recommend the following healthy lifestyle measures: - eat a healthy whole foods diet consisting of regular small meals composed of vegetables, fruits, beans, nuts, seeds, healthy meats such as white chicken and fish and whole grains.  - avoid sweets, white starchy foods, fried foods, fast food, processed foods, sodas, red meet and other fattening foods.  - get a least 150-300 minutes of aerobic exercise per week.   -We have ordered labs or studies at this visit. It can take up to 1-2 weeks for results and processing. We will contact you with instructions IF your results are abnormal. Normal results will be released to your [facility]. If you have not heard from [place] or can not find your results in [facility] in 2 weeks please contact our office.

## 2016-04-05 NOTE — Progress Notes (Signed)
Pre visit review using our clinic review tool, if applicable. No additional management support is needed unless otherwise documented below in the visit note. 

## 2016-04-05 NOTE — Progress Notes (Signed)
HPI:  Ronnie Vazquez is a pleasant 70 yo, here for follow up. Chronic medical problems:  Mild HLD: -lifestyle:  Walks 4-5 miles 5 days per week, diet so so -I advised crestor, he refused - reports his cholesterol has always been high and he is not concerned -denies: CP, SOB, DOE -? TIA in the past, he does not think so, sees optho and neurologist  Refused colon cancer screening today.  ROS: See pertinent positives and negatives per HPI.  Past Medical History  Diagnosis Date  . Chicken pox   . Hyperlipidemia   . Microscopic hematuria 02/02/2013  . Hyperlipemia 02/02/2013  . Left shoulder pain 02/02/2013  . Vision loss of left eye     Past Surgical History  Procedure Laterality Date  . Tonsillectomy and adenoidectomy  1959    Family History  Problem Relation Age of Onset  . Prostate cancer Father     Social History   Social History  . Marital Status: Married    Spouse Name: N/A  . Number of Children: N/A  . Years of Education: N/A   Social History Main Topics  . Smoking status: Never Smoker   . Smokeless tobacco: Never Used  . Alcohol Use: Yes     Comment: 2-3 drinks 3-4 days per week  . Drug Use: No  . Sexual Activity: Not Asked   Other Topics Concern  . 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  Acetaminophen (TYLENOL PO), Take by mouth as needed., Disp: , Rfl:  .  aspirin 81 MG tablet, Take 81 mg by mouth daily., Disp: , Rfl:  .  ibuprofen (ADVIL,MOTRIN) 200 MG tablet, Take 200 mg by mouth every 6 (six) hours as needed for pain., Disp: , Rfl:  .  OVER THE COUNTER MEDICATION, Take 1 tablet by mouth daily as needed. For allergies. [employer] brand, Disp: , Rfl:   EXAM:  Filed Vitals:   04/05/16 0849  BP: 118/62  Pulse: 67  Temp: 98.2  F (36.8 C)    Body mass index is 29.37 kg/(m^2).  GENERAL: vitals reviewed and listed above, alert, oriented, appears well hydrated and in no acute distress  HEENT: atraumatic, conjunttiva clear, no obvious abnormalities on inspection of external nose and ears  NECK: no obvious masses on inspection  LUNGS: clear to auscultation bilaterally, no wheezes, rales or rhonchi, good air movement  CV: HRRR, no peripheral edema  MS: moves all extremities without noticeable abnormality  PSYCH: pleasant and cooperative, no obvious depression or anxiety  ASSESSMENT AND PLAN:  Discussed the following assessment and plan:  Hyperlipemia - Plan: Lipid Panel  Transient cerebral ischemia, unspecified transient cerebral ischemia type  Bilateral carotid artery disease (HCC)  -Discussed cardiovascular risk of elevated cholesterol ratio, discussed risks and benefits of various treatment options -Advised a healthy lifestyle and regular aerobic exercise and a healthy diet -Recheck lipids, consider statin -On review of chart to his visit, appears he had not followed up with his neurologist for repeat Dopplers (advised in 1 year per report), advised assistant to notify him in follow-up -Continue aspirin -Patient advised to return or notify a doctor immediately if  symptoms worsen or persist or new concerns arise.  Patient Instructions  Before you leave: -labs -wellness visit in December or January  Please follow up with your neurologist about repeat dopplers of your carotid arteries if you have not already done so.  We recommend the following healthy lifestyle measures: - eat a healthy whole foods diet consisting of regular small meals composed of vegetables, fruits, beans, nuts, seeds, healthy meats such as white chicken and fish and whole grains.  - avoid sweets, white starchy foods, fried foods, fast food, processed foods, sodas, red meet and other fattening foods.  - get a least 150-300  minutes of aerobic exercise per week.   -We have ordered labs or studies at this visit. It can take up to 1-2 weeks for results and processing. We will contact you with instructions IF your results are abnormal. Normal results will be released to your [facility]. If you have not heard from [place] or can not find your results in [facility] in 2 weeks please contact our office.            Colin Benton R.

## 2016-04-08 MED ORDER — ROSUVASTATIN CALCIUM 10 MG PO TABS: 10.0000 mg | ORAL_TABLET | Freq: Every day | ORAL | Status: DC

## 2016-04-08 NOTE — Addendum Note (Signed)
Addended by: Agnes Lawrence on: 04/08/2016 09:52 AM   Modules accepted: Orders

## 2016-07-09 ENCOUNTER — Ambulatory Visit: Payer: Managed Care, Other (non HMO) | Admitting: Family Medicine

## 2016-11-29 ENCOUNTER — Ambulatory Visit: Payer: Managed Care, Other (non HMO) | Admitting: Family Medicine

## 2017-08-11 NOTE — Progress Notes (Signed)
Medicare Annual Preventive Care Visit  (initial annual wellness or annual wellness exam)  Concerns and/or follow up today:  Here for CPE: Not seen in over 1 year. Hx poor compliance with recs. Due for labs, UA, PPSV23, flu shot, colon ca screening, hep c  -Concerns and/or follow up today: none Hx of hyperlipidemia. He refused medication - reports crestor 35m caused muscle cramps. ? TIA - saw neurology and optho - he reports he doesn't think this was a stroke. ? Carotid art disease. He does not want to further eval this. Pt chooses to exercise instead of meds. Does agree to consider a different statin alt day dosing if chol up. Hx microscopic hematuria. Neg eval.   See HM section in Epic for other details of completed HM. See scanned documentation under Media Tab for further documentation HPI, health risk assessment. See Media Tab and Care Teams sections in Epic for other providers.  ROS: negative for report of fevers, unintentional weight loss, vision changes, vision loss, hearing loss or change, chest pain, sob, hemoptysis, melena, hematochezia, hematuria, genital discharge or lesions, falls, bleeding or bruising, loc, thoughts of suicide or self harm, memory loss  1.) Patient-completed health risk assessment  - completed and reviewed, see scanned documentation  2.) Review of Medical History: -PMH, PSH, Family History and current specialty and care providers reviewed and updated and listed below  - see scanned in document in chart and below  Past Medical History:  Diagnosis Date  . Chicken pox   . Hyperlipemia 02/02/2013  . Hyperlipidemia   . Left shoulder pain 02/02/2013  . Microscopic hematuria 02/02/2013  . Vision loss of left eye     Past Surgical History:  Procedure Laterality Date  . TONSILLECTOMY AND ADENOIDECTOMY  1959    Social History   Social History  . Marital status: Married    Spouse name: N/A  . Number of children: N/A  . Years of education: N/A    Occupational History  . Not on file.   Social History Main Topics  . Smoking status: Never Smoker  . Smokeless tobacco: Never Used  . Alcohol use Yes     Comment: 2-3 drinks 3-4 days per week  . Drug use: No  . Sexual activity: Not on file   Other Topics Concern  . 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 Prostate cancer Father     Current Outpatient Prescriptions on File Prior to Visit  Medication Sig Dispense Refill  . Acetaminophen (TYLENOL PO) Take by mouth as needed.    .Marland Kitchenaspirin 81 MG tablet Take 81 mg by mouth daily.    .Marland Kitchenibuprofen (ADVIL,MOTRIN) 200 MG tablet Take 200 mg by mouth every 6 (six) hours as needed for pain.    .Marland KitchenOVER THE COUNTER MEDICATION Take 1 tablet by mouth daily as needed. For allergies. [employer] brand     No current facility-administered medications on file prior to visit.      3.) Review of functional ability and level of safety:  Any difficulty hearing?  See scanned documentation  History of falling?  See scanned  documentation  Any trouble with IADLs - using a phone, using transportation, grocery shopping, preparing meals, doing housework, doing laundry, taking medications and managing money?  See scanned documentation  Advance Directives?  See scanned documentation  See summary of recommendations in Patient Instructions below.  4.) Physical Exam Vitals:   08/12/17 0830  BP: 98/60  Pulse: 70  Temp: 97.6 F (36.4 C)   Estimated body mass index is 28.53 kg/m as calculated from the following:   Height as of this encounter: 5' 9.25" (1.759 m).   Weight as of this encounter: 194 lb 9.6 oz (88.3 kg).  EKG (optional): deferred  General: alert, appear well hydrated and in no acute  distress  HEENT: visual acuity grossly intact  CV: HRRR, no le edema  Lungs: CTA bilaterally  Psych: pleasant and cooperative, no obvious depression or anxiety  Cognitive function grossly intact  GU: declined  See patient instructions for recommendations.  Education and counseling regarding the above review of health provided with a plan for the following: -see scanned patient completed form for further details -fall prevention strategies discussed  -healthy lifestyle discussed -importance and resources for completing advanced directives discussed -see patient instructions below for any other recommendations provided  4)The following written screening schedule of preventive measures were reviewed with assessment and plan made per below, orders and patient instructions:      AAA screening done if applicable     Alcohol screening done     Obesity Screening and counseling done     STI screening (Hep C if born 1943-65) offered and per pt wishes      Tobacco Screening done       Pneumococcal (PPSV23 -one dose after 64, one before if risk factors), influenza yearly and hepatitis B vaccines (if high risk - end stage renal disease, IV drugs, homosexual men, live in home for mentally retarded, hemophilia receiving factors) ASSESSMENT/PLAN: done today      Prostate cancer screening ASSESSMENT/PLAN: discussed risks and benefits, he opted for PSA, declined DRE      Colorectal cancer screening - discussed options and risks: ASSESSMENT/PLAN: he agreed to cologuard test - Assistant to order      Diabetes outpatient self-management training services ASSESSMENT/PLAN: utd or done      Screening for glaucoma(q1y if high risk - diabetes, FH, AA and > 50 or hispanic and > 65) ASSESSMENT/PLAN: see scanned documentation      Medical nutritional therapy for individuals with diabetes or renal disease ASSESSMENT/PLAN: see orders      Cardiovascular screening blood tests (lipids  q5y) ASSESSMENT/PLAN: see orders and labs      Diabetes screening tests ASSESSMENT/PLAN: see orders and labs   7.) Summary:   Medicare annual wellness visit, subsequent - Plan: PSA -risk factors and conditions per above assessment were discussed and treatment, recommendations and referrals were offered per documentation above and orders and patient instructions.  Hyperlipidemia, unspecified hyperlipidemia type - Plan: Lipid panel -discussed risks/benefits various tx options, he may consider alt day dose of pravastatin  Microscopic hematuria, evaluated by urology 02/2013 - Plan: Urine Microscopic -urology eval if increased hematuria  Bilateral carotid artery disease ([place]) -discussed CV risk reduction -he has opted for lifestyle changes alone  Hep C screening for baby boomer - Plan: Hepatitis C antibody  Hyperglycemia screening given CV risks - Plan: Hemoglobin A1c  Need for prophylactic vaccination against Streptococcus pneumoniae (pneumococcus) - Plan: Pneumococcal polysaccharide vaccine 23-valent greater than or equal to 2yo subcutaneous/IM  Patient Instructions  BEFORE YOU LEAVE: -order cologuard -PPSV23 vaccine -lab -follow up: Medicare visit with [name] and follow up with Dr. Maudie Mercury - same day in 1 year   [name] , Thank you for taking time to come for your Medicare Wellness Visit. I appreciate your ongoing commitment to your health goals. Please review the following plan we discussed and let me know if I can assist you in the future.   These are the goals we discussed: Goals    Get your flu shot in September or October.  We ordered the Cologuard test for colon cancer screening. Please complete this test promptly once the kit arrives. Please contact us if you have not received your kit in the next few weeks.  PSA and Hep C screening with labs  Pneumonia shot today      This is a list of the screening recommended for you and due dates:  Health Maintenance   Topic Date Due  . Flu Shot  Please get in sept or oct 2018  . Colon Cancer Screening  We ordered cologuard today  . Tetanus Vaccine  05/28/2019  .  Hepatitis C: One time screening is recommended by Center for Disease Control  (CDC) for  adults born from 50 through 1965.   Completed today  . Pneumonia vaccines  Completed today  *Topic was postponed. The date shown is not the original due date.   WE NOW OFFER   [facility] FAST TRACK!!!  SAME DAY Appointments for ACUTE CARE  Such as: Sprains, Injuries, cuts, abrasions, rashes, muscle pain, joint pain, back pain Colds, flu, sore throats, headache, allergies, cough, fever  Ear pain, sinus and eye infections Abdominal pain, nausea, vomiting, diarrhea, upset stomach Animal/insect bites  3 Easy Ways to Schedule: Walk-In Scheduling Call in scheduling Mychart Sign-up: [email or web address]           Colin Benton R., DO

## 2017-08-12 ENCOUNTER — Ambulatory Visit (INDEPENDENT_AMBULATORY_CARE_PROVIDER_SITE_OTHER): Payer: Medicare Other | Admitting: Family Medicine

## 2017-08-12 ENCOUNTER — Encounter: Payer: Self-pay | Admitting: Family Medicine

## 2017-08-12 VITALS — BP 98/60 | HR 70 | Temp 97.6°F | Ht 69.25 in | Wt 194.6 lb

## 2017-08-12 DIAGNOSIS — Z7289 Other problems related to lifestyle: Secondary | ICD-10-CM

## 2017-08-12 DIAGNOSIS — E785 Hyperlipidemia, unspecified: Secondary | ICD-10-CM

## 2017-08-12 DIAGNOSIS — Z23 Encounter for immunization: Secondary | ICD-10-CM | POA: Diagnosis not present

## 2017-08-12 DIAGNOSIS — I739 Peripheral vascular disease, unspecified: Secondary | ICD-10-CM

## 2017-08-12 DIAGNOSIS — Z0000 Encounter for general adult medical examination without abnormal findings: Secondary | ICD-10-CM

## 2017-08-12 DIAGNOSIS — R739 Hyperglycemia, unspecified: Secondary | ICD-10-CM | POA: Diagnosis not present

## 2017-08-12 DIAGNOSIS — R3129 Other microscopic hematuria: Secondary | ICD-10-CM | POA: Diagnosis not present

## 2017-08-12 DIAGNOSIS — I779 Disorder of arteries and arterioles, unspecified: Secondary | ICD-10-CM | POA: Diagnosis not present

## 2017-08-12 LAB — HEMOGLOBIN A1C: Hgb A1c MFr Bld: 5.5 % (ref 4.6–6.5)

## 2017-08-12 LAB — LIPID PANEL
CHOL/HDL RATIO: 5
Cholesterol: 224 mg/dL — ABNORMAL HIGH (ref 0–200)
HDL: 42 mg/dL (ref >39.00)
NONHDL: 182.29
TRIGLYCERIDES: 301 mg/dL — AB (ref 0.0–149.0)
VLDL: 60.2 mg/dL — ABNORMAL HIGH (ref 0.0–40.0)

## 2017-08-12 LAB — URINALYSIS, MICROSCOPIC ONLY
RBC / HPF: NONE SEEN (ref 0–?)
WBC, UA: NONE SEEN (ref 0–?)

## 2017-08-12 LAB — PSA: PSA: 1.73 ng/mL (ref 0.10–4.00)

## 2017-08-12 LAB — LDL CHOLESTEROL, DIRECT: Direct LDL: 117 mg/dL

## 2017-08-12 NOTE — Patient Instructions (Signed)
BEFORE YOU LEAVE: -order cologuard -PPSV23 vaccine -lab -follow up: Medicare visit with Ronnie Vazquez and follow up with Dr. Maudie Mercury - same day in 1 year   [name] , Thank you for taking time to come for your Medicare Wellness Visit. I appreciate your ongoing commitment to your health goals. Please review the following plan we discussed and let me know if I can assist you in the future.   These are the goals we discussed: Goals    Get your flu shot in September or October.  We ordered the Cologuard test for colon cancer screening. Please complete this test promptly once the kit arrives. Please contact us if you have not received your kit in the next few weeks.  PSA and Hep C screening with labs  Pneumonia shot today      This is a list of the screening recommended for you and due dates:  Health Maintenance  Topic Date Due  . Flu Shot  Please get in sept or oct 2018  . Colon Cancer Screening  We ordered cologuard today  . Tetanus Vaccine  05/28/2019  .  Hepatitis C: One time screening is recommended by Center for Disease Control  (CDC) for  adults born from 46 through 1963.   Completed today  . Pneumonia vaccines  Completed today  *Topic was postponed. The date shown is not the original due date.   WE NOW OFFER   [facility] FAST TRACK!!!  SAME DAY Appointments for ACUTE CARE  Such as: Sprains, Injuries, cuts, abrasions, rashes, muscle pain, joint pain, back pain Colds, flu, sore throats, headache, allergies, cough, fever  Ear pain, sinus and eye infections Abdominal pain, nausea, vomiting, diarrhea, upset stomach Animal/insect bites  3 Easy Ways to Schedule: Walk-In Scheduling Call in scheduling Mychart Sign-up: [email or web address]

## 2017-08-13 LAB — HEPATITIS C ANTIBODY: HCV AB: NONREACTIVE

## 2017-09-28 ENCOUNTER — Encounter: Payer: Self-pay | Admitting: Family Medicine

## 2017-09-28 DIAGNOSIS — Z1212 Encounter for screening for malignant neoplasm of rectum: Secondary | ICD-10-CM | POA: Diagnosis not present

## 2017-09-28 DIAGNOSIS — Z1211 Encounter for screening for malignant neoplasm of colon: Secondary | ICD-10-CM | POA: Diagnosis not present

## 2017-09-28 LAB — COLOGUARD: COLOGUARD: POSITIVE

## 2017-10-10 ENCOUNTER — Telehealth: Payer: Self-pay | Admitting: *Deleted

## 2017-10-10 ENCOUNTER — Encounter: Payer: Self-pay | Admitting: Gastroenterology

## 2017-10-10 DIAGNOSIS — R195 Other fecal abnormalities: Secondary | ICD-10-CM

## 2017-10-10 NOTE — Telephone Encounter (Signed)
Dr Maudie Mercury received the Cologuard results which were positive and advised the pt be referred to a gastroenterologist.  I called the pt and informed him of this and he is aware someone will call with appt info.

## 2017-10-27 HISTORY — PX: POLYPECTOMY: SHX149

## 2017-10-27 HISTORY — PX: COLONOSCOPY: SHX174

## 2017-10-28 ENCOUNTER — Ambulatory Visit (AMBULATORY_SURGERY_CENTER): Payer: Medicare Other

## 2017-10-28 VITALS — Ht 70.5 in | Wt 199.0 lb

## 2017-10-28 DIAGNOSIS — Z1211 Encounter for screening for malignant neoplasm of colon: Secondary | ICD-10-CM

## 2017-10-28 MED ORDER — NA SULFATE-K SULFATE-MG SULF 17.5-3.13-1.6 GM/177ML PO SOLN: 1.0000 | Freq: Once | ORAL | 0 refills | Status: AC

## 2017-10-28 NOTE — Progress Notes (Signed)
Denies allergies to eggs or soy products. Denies complication of anesthesia or sedation. Denies use of weight loss medication. Denies use of O2.   Emmi instructions declined.  

## 2017-11-11 ENCOUNTER — Ambulatory Visit (AMBULATORY_SURGERY_CENTER): Payer: Medicare Other | Admitting: Gastroenterology

## 2017-11-11 ENCOUNTER — Encounter: Payer: Self-pay | Admitting: Gastroenterology

## 2017-11-11 ENCOUNTER — Other Ambulatory Visit: Payer: Self-pay

## 2017-11-11 VITALS — BP 142/82 | HR 60 | Temp 97.8°F | Resp 10 | Ht 61.0 in | Wt 199.0 lb

## 2017-11-11 DIAGNOSIS — Z1211 Encounter for screening for malignant neoplasm of colon: Secondary | ICD-10-CM | POA: Diagnosis not present

## 2017-11-11 DIAGNOSIS — D122 Benign neoplasm of ascending colon: Secondary | ICD-10-CM | POA: Diagnosis not present

## 2017-11-11 DIAGNOSIS — D125 Benign neoplasm of sigmoid colon: Secondary | ICD-10-CM

## 2017-11-11 DIAGNOSIS — D120 Benign neoplasm of cecum: Secondary | ICD-10-CM | POA: Diagnosis not present

## 2017-11-11 MED ORDER — SODIUM CHLORIDE 0.9 % IV SOLN: 500.0000 mL | INTRAVENOUS | Status: DC

## 2017-11-11 NOTE — Progress Notes (Signed)
To recovery, report to RN, VSS. 

## 2017-11-11 NOTE — Patient Instructions (Signed)
YOU HAD AN ENDOSCOPIC PROCEDURE TODAY AT [facility]:   Refer to the procedure report that was given to you for any specific questions about what was found during the examination.  If the procedure report does not answer your questions, please call your gastroenterologist to clarify.  If you requested that your care partner not be given the details of your procedure findings, then the procedure report has been included in a sealed envelope for you to review at your convenience later.  YOU SHOULD EXPECT: Some feelings of bloating in the abdomen. Passage of more gas than usual.  Walking can help get rid of the air that was put into your GI tract during the procedure and reduce the bloating. If you had a lower endoscopy (such as a colonoscopy or flexible sigmoidoscopy) you may notice spotting of blood in your stool or on the toilet paper. If you underwent a bowel prep for your procedure, you may not have a normal bowel movement for a few days.  Please Note:  You might notice some irritation and congestion in your nose or some drainage.  This is from the oxygen used during your procedure.  There is no need for concern and it should clear up in a day or so.  SYMPTOMS TO REPORT IMMEDIATELY:   Following lower endoscopy (colonoscopy or flexible sigmoidoscopy):  Excessive amounts of blood in the stool  Significant tenderness or worsening of abdominal pains  Swelling of the abdomen that is new, acute  Fever of 100F or higher   Following upper endoscopy (EGD)  Vomiting of blood or coffee ground material  New chest pain or pain under the shoulder blades  Painful or persistently difficult swallowing  New shortness of breath  Fever of 100F or higher  Black, tarry-looking stools  For urgent or emergent issues, a gastroenterologist can be reached at any hour by calling [phone].   DIET:  We do recommend a small meal at first, but then you may proceed to your regular diet.  Drink  plenty of fluids but you should avoid alcoholic beverages for 24 hours.  ACTIVITY:  You should plan to take it easy for the rest of today and you should NOT DRIVE or use heavy machinery until tomorrow (because of the sedation medicines used during the test).    FOLLOW UP: Our staff will call the number listed on your records the next business day following your procedure to check on you and address any questions or concerns that you may have regarding the information given to you following your procedure. If we do not reach you, we will leave a message.  However, if you are feeling well and you are not experiencing any problems, there is no need to return our call.  We will assume that you have returned to your regular daily activities without incident.  If any biopsies were taken you will be contacted by phone or by letter within the next 1-3 weeks.  Please call us at [phone] if you have not heard about the biopsies in 3 weeks.    SIGNATURES/CONFIDENTIALITY: You and/or your care partner have signed paperwork which will be entered into your electronic medical record.  These signatures attest to the fact that that the information above on your After Visit Summary has been reviewed and is understood.  Full responsibility of the confidentiality of this discharge information lies with you and/or your care-partner.YOU HAD AN ENDOSCOPIC PROCEDURE TODAY AT [facility]:  Refer to the procedure report that was given to you for any specific questions about what was found during the examination.  If the procedure report does not answer your questions, please call your gastroenterologist to clarify.  If you requested that your care partner not be given the details of your procedure findings, then the procedure report has been included in a sealed envelope for you to review at your convenience later.  YOU SHOULD EXPECT: Some feelings of bloating in the abdomen. Passage of more gas than  usual.  Walking can help get rid of the air that was put into your GI tract during the procedure and reduce the bloating. If you had a lower endoscopy (such as a colonoscopy or flexible sigmoidoscopy) you may notice spotting of blood in your stool or on the toilet paper. If you underwent a bowel prep for your procedure, you may not have a normal bowel movement for a few days.  Please Note:  You might notice some irritation and congestion in your nose or some drainage.  This is from the oxygen used during your procedure.  There is no need for concern and it should clear up in a day or so.  SYMPTOMS TO REPORT IMMEDIATELY:   Following lower endoscopy (colonoscopy or flexible sigmoidoscopy):  Excessive amounts of blood in the stool  Significant tenderness or worsening of abdominal pains  Swelling of the abdomen that is new, acute  Fever of 100F or higher   For urgent or emergent issues, a gastroenterologist can be reached at any hour by calling [phone].   DIET:  We do recommend a small meal at first, but then you may proceed to your regular diet.  Drink plenty of fluids but you should avoid alcoholic beverages for 24 hours. Try to increase the fiber in your diet, and drink plenty of water.  ACTIVITY:  You should plan to take it easy for the rest of today and you should NOT DRIVE or use heavy machinery until tomorrow (because of the sedation medicines used during the test).    FOLLOW UP: Our staff will call the number listed on your records the next business day following your procedure to check on you and address any questions or concerns that you may have regarding the information given to you following your procedure. If we do not reach you, we will leave a message.  However, if you are feeling well and you are not experiencing any problems, there is no need to return our call.  We will assume that you have returned to your regular daily activities without incident.  If any biopsies  were taken you will be contacted by phone or by letter within the next 1-3 weeks.  Please call us at [phone] if you have not heard about the biopsies in 3 weeks.    SIGNATURES/CONFIDENTIALITY: You and/or your care partner have signed paperwork which will be entered into your electronic medical record.  These signatures attest to the fact that that the information above on your After Visit Summary has been reviewed and is understood.  Full responsibility of the confidentiality of this discharge information lies with you and/or your care-partner.  No NSAIDS: aspirin, aleve nor ibuprofen for two weeks per Dr. Fuller Plan.

## 2017-11-11 NOTE — Progress Notes (Signed)
Called to room to assist during endoscopic procedure.  Patient ID and intended procedure confirmed with present staff. Received instructions for my participation in the procedure from the performing physician.  

## 2017-11-11 NOTE — Progress Notes (Signed)
Pt's states no medical or surgical changes since previsit or office visit. 

## 2017-11-11 NOTE — Op Note (Signed)
[facility] Patient Name: Ronnie Vazquez Procedure Date: 11/11/2017 1:37 PM MRN: [id number] Endoscopist: Ladene Artist , MD Age: 71 Referring MD:  Date of Birth: 08-16-1946 Gender: Male Account #: [id number] Procedure:                Colonoscopy Indications:              Screening for colorectal malignant neoplasm Medicines:                Monitored Anesthesia Care Procedure:                Pre-Anesthesia Assessment:                           - Prior to the procedure, a History and Physical                            was performed, and patient medications and                            allergies were reviewed. The patient's tolerance of                            previous anesthesia was also reviewed. The risks                            and benefits of the procedure and the sedation                            options and risks were discussed with the patient.                            All questions were answered, and informed consent                            was obtained. Prior Anticoagulants: The patient has                            taken no previous anticoagulant or antiplatelet                            agents. ASA Grade Assessment: II - A patient with                            mild systemic disease. After reviewing the risks                            and benefits, the patient was deemed in                            satisfactory condition to undergo the procedure.                           After obtaining informed consent, the colonoscope  was passed under direct vision. Throughout the                            procedure, the patient's blood pressure, pulse, and                            oxygen saturations were monitored continuously. The                            Colonoscope was introduced through the anus and                            advanced to the the cecum, identified by                            appendiceal orifice and  ileocecal valve. The                            ileocecal valve, appendiceal orifice, and rectum                            were photographed. The quality of the bowel                            preparation was good. The colonoscopy was performed                            without difficulty. The patient tolerated the                            procedure well. Scope In: 1:42:48 PM Scope Out: 2:02:11 PM Scope Withdrawal Time: 0 hours 17 minutes 6 seconds  Total Procedure Duration: 0 hours 19 minutes 23 seconds  Findings:                 The perianal and digital rectal examinations were                            normal.                           A 5 mm polyp was found in the ascending colon. The                            polyp was sessile. The polyp was removed with a                            cold biopsy forceps. Resection and retrieval were                            complete.                           Two sessile polyps were found in the sigmoid colon  and cecum. The polyps were 10 to 12 mm in size.                            These polyps were removed with a hot snare.                            Resection and retrieval were complete.                           Three sessile polyps were found in the sigmoid                            colon, ascending colon and cecum. The polyps were 5                            to 7 mm in size. These polyps were removed with a                            cold snare. Resection and retrieval were complete.                           Multiple medium-mouthed diverticula were found in                            the left colon. There was narrowing of the colon in                            association with the diverticular opening. There                            was evidence of diverticular spasm.                            Peri-diverticular erythema was seen. There was no                            evidence of diverticular  bleeding.                           Scattered small-mouthed diverticula were found in                            the transverse colon. There was no evidence of                            diverticular bleeding.                           Internal hemorrhoids were found during                            retroflexion. The hemorrhoids were medium-sized and  Grade I (internal hemorrhoids that do not prolapse).                           The exam was otherwise without abnormality on                            direct and retroflexion views. Complications:            No immediate complications. Estimated blood loss:                            None. Estimated Blood Loss:     Estimated blood loss: none. Impression:               - One 5 mm polyp in the ascending colon, removed                            with a cold biopsy forceps. Resected and retrieved.                           - Two 10 to 12 mm polyps in the sigmoid colon and                            in the cecum, removed with a hot snare. Resected                            and retrieved.                           - Three 5 to 7 mm polyps in the sigmoid colon, in                            the ascending colon and in the cecum, removed with                            a cold snare. Resected and retrieved.                           - Moderate diverticulosis in the left colon. There                            was narrowing of the colon in association with the                            diverticular opening. There was evidence of                            diverticular spasm. Peri-diverticular erythema was                            seen. There was no evidence of diverticular                            bleeding.                           -  Mild diverticulosis in the transverse colon.                            There was no evidence of diverticular bleeding.                           - Internal hemorrhoids.                            - The examination was otherwise normal on direct                            and retroflexion views. Recommendation:           - Repeat colonoscopy in 3 - 5 years for                            surveillance pending pathology review.                           - Patient has a contact number available for                            emergencies. The signs and symptoms of potential                            delayed complications were discussed with the                            patient. Return to normal activities tomorrow.                            Written discharge instructions were provided to the                            patient.                           - High fiber diet.                           - Continue present medications.                           - Await pathology results.                           - No aspirin, ibuprofen, naproxen, or other                            non-steroidal anti-inflammatory drugs for 2 weeks                            after polyp removal. Ladene Artist, MD 11/11/2017 2:10:27 PM This report has been signed electronically.

## 2017-11-14 ENCOUNTER — Telehealth: Payer: Self-pay | Admitting: *Deleted

## 2017-11-14 NOTE — Telephone Encounter (Signed)
  Follow up Call-  Call back number 11/11/2017  Post procedure Call Back phone  # [phone]  Permission to leave phone message Yes  Some recent data might be hidden     Patient questions:  Do you have a fever, pain , or abdominal swelling? No. Pain Score  0 *  Have you tolerated food without any problems? Yes.    Have you been able to return to your normal activities? Yes.    Do you have any questions about your discharge instructions: Diet   No. Medications  No. Follow up visit  No.  Do you have questions or concerns about your Care? No.  Actions: * If pain score is 4 or above: No action needed, pain <4.

## 2017-11-23 ENCOUNTER — Encounter: Payer: Self-pay | Admitting: Gastroenterology

## 2018-07-20 ENCOUNTER — Telehealth: Payer: Self-pay

## 2018-07-20 NOTE — Telephone Encounter (Signed)
Call to [name] after [employer] followed up on charges for 08/12/2017 [id number] was billed but the patient was charged for 15 minutes visit and this was incorrect. Billing notified to refund the patient.   Wynetta Fines RN

## 2018-07-21 ENCOUNTER — Ambulatory Visit (INDEPENDENT_AMBULATORY_CARE_PROVIDER_SITE_OTHER): Payer: Medicare Other | Admitting: Family Medicine

## 2018-07-21 ENCOUNTER — Encounter: Payer: Self-pay | Admitting: Family Medicine

## 2018-07-21 VITALS — BP 124/78 | HR 68 | Temp 97.6°F | Wt 205.8 lb

## 2018-07-21 DIAGNOSIS — R079 Chest pain, unspecified: Secondary | ICD-10-CM

## 2018-07-21 NOTE — Progress Notes (Signed)
Ronnie Vazquez DOB: 03/25/1946 Encounter date: 07/21/2018  This is a 72 y.o. male who presents with Chief Complaint  Patient presents with  . Chest Pain    only presesnt when walking up an incline or stairs, patient states that "it doesn't feel like a pain or pressure it just feels a little off"  sensation is in the center of the chest, no trouble breathing    History of present illness:  Has been having issues for 6 weeks to two months. Usually walks an hour. When he goes up a hill gets a little tightness in center of chest.   Last night at a concert and gets to top of stairs and felt same sensation. Used to not feel something like this unless he ran very long distances.   On treadmill and when he went to incline of 4 it bothered him enough that he stopped exercise. (this was about 10-12 minutes after being at elevated incline of 5 and coming down to 4). Doesn't feel it with regular walking; just if he "pushes it"  Not hard to catch breath. Not sure exactly what feeling is. Takes a couple of minutes for sensation to go away. Stops activity to let this go away.   Had episode of dizziness during this time but not associated with exertion (this was associated with jumping out of car after long car drive). No dizziness with exercise. Has felt like he is more diaphoretic at the time when he experiences exertional symptoms.     Previous cardiac eval in 2014-15 after possible TIA. Echo and carotid dopplers wnl at that time.  No Known Allergies Current Meds  Medication Sig  . Acetaminophen (TYLENOL PO) Take by mouth as needed.  Marland Kitchen aspirin 81 MG tablet Take 81 mg by mouth daily.  Marland Kitchen ibuprofen (ADVIL,MOTRIN) 200 MG tablet Take 200 mg by mouth every 6 (six) hours as needed for pain.  Marland Kitchen OVER THE COUNTER MEDICATION Take 1 tablet by mouth daily as needed. For allergies. [employer] brand    Review of Systems  Constitutional: Positive for activity change (has stopped exertional activity in recent days  due to concerns with symptoms) and diaphoresis (see hpi). Negative for fatigue.  Respiratory: Positive for chest tightness (on exertion). Negative for cough, shortness of breath and wheezing.   Cardiovascular: Negative for chest pain, palpitations and leg swelling.  Gastrointestinal: Negative for abdominal distention and abdominal pain.    Objective:  BP 124/78   Pulse 68   Temp 97.6 F (36.4 C) (Oral)   Wt 205 lb 12.8 oz (93.4 kg)   SpO2 97%   BMI 38.89 kg/m   Weight: 205 lb 12.8 oz (93.4 kg)   BP Readings from Last 3 Encounters:  07/21/18 124/78  11/11/17 (!) 142/82  08/12/17 98/60   Wt Readings from Last 3 Encounters:  07/21/18 205 lb 12.8 oz (93.4 kg)  11/11/17 199 lb (90.3 kg)  10/28/17 199 lb (90.3 kg)    Physical Exam  Constitutional: He appears well-developed and well-nourished. No distress.  Cardiovascular: Normal rate, regular rhythm and normal heart sounds. Exam reveals no friction rub.  No murmur heard. Pulses:      Dorsalis pedis pulses are 2+ on the right side, and 2+ on the left side.  Pulmonary/Chest: Effort normal and breath sounds normal. No respiratory distress. He has no wheezes. He has no rales.  Abdominal: Soft. Bowel sounds are normal. He exhibits no distension, no abdominal bruit, no ascites, no pulsatile midline mass and no  mass. There is no splenomegaly or hepatomegaly. There is no tenderness. There is no rebound and no guarding.  Psychiatric: He has a normal mood and affect.    Assessment/Plan  1. Chest pain on exertion Baseline EKG is stable in office. He is currently asymptomatic. Symptoms are concerning for being cardiac in nature and further evaluation is needed. I have elected to send to cardiology for eval as normal stress testing would not reassure me of negative pathology due to consistency of symptoms. We discussed that if chest pain recurs he needs to go to ER. We discussed consideration for on hand nitro and he prefers to just monitor  symptoms and will refrain from exertional activity until evaluation by cardiology is complete. I have put in referral urgently as I would prefer evaluation in next couple of weeks and have advised patient that I will check on status next week.  - EKG 12-Lead - Ambulatory referral to Cardiology   Return for scheduled physical in August.   Micheline Rough, MD

## 2018-07-22 ENCOUNTER — Encounter: Payer: Self-pay | Admitting: Family Medicine

## 2018-07-26 NOTE — Progress Notes (Signed)
I told Ronnie Vazquez I would make sure referral was in place this week; so let him know it is in process and it looks like he should hear from the cardiology office regarding scheduling. He can check in with them on Monday if he hasn't heard by that time.   (from his chart: Sent to work que to schedule their office will contact pt to schedule direclty [facility]  Address: [address], [place], [place] [address] Phone: [phone]

## 2018-07-27 ENCOUNTER — Telehealth: Payer: Self-pay

## 2018-07-27 NOTE — Telephone Encounter (Signed)
Called and spoke to patient, He informed me that Cardiology has already called and made him an appointment for next Tuesday 8/6.

## 2018-07-27 NOTE — Telephone Encounter (Signed)
-----   Message from Caren Macadam, MD sent at 07/26/2018 11:42 AM EDT -----   ----- Message ----- From: Caren Macadam, MD Sent: 07/22/2018  11:48 AM To: Caren Macadam, MD  Check on cardiology referral status

## 2018-07-28 NOTE — Telephone Encounter (Signed)
Noted  

## 2018-08-01 ENCOUNTER — Ambulatory Visit (INDEPENDENT_AMBULATORY_CARE_PROVIDER_SITE_OTHER): Payer: Medicare Other | Admitting: Cardiovascular Disease

## 2018-08-01 ENCOUNTER — Encounter: Payer: Self-pay | Admitting: Cardiovascular Disease

## 2018-08-01 VITALS — BP 110/76 | HR 68 | Ht 70.0 in | Wt 202.0 lb

## 2018-08-01 DIAGNOSIS — E7800 Pure hypercholesterolemia, unspecified: Secondary | ICD-10-CM | POA: Diagnosis not present

## 2018-08-01 DIAGNOSIS — I208 Other forms of angina pectoris: Secondary | ICD-10-CM | POA: Diagnosis not present

## 2018-08-01 DIAGNOSIS — R079 Chest pain, unspecified: Secondary | ICD-10-CM

## 2018-08-01 MED ORDER — ROSUVASTATIN CALCIUM 5 MG PO TABS: 5.0000 mg | ORAL_TABLET | ORAL | 3 refills | Status: DC

## 2018-08-01 NOTE — Assessment & Plan Note (Signed)
6 to 8 weeks history of exertional chest pain that occurs pretty with reproducibly at a specific workload.  His risk factors include hyperlipidemia.  I am going to get an exercise Myoview stress test to further evaluate.

## 2018-08-01 NOTE — Patient Instructions (Addendum)
Medication Instructions:  Your physician has recommended you make the following change in your medication:  1) START Crestor 5mg  tablet by mouth TWICE weekly    Labwork: Your physician recommends that you return for lab work in: FASTING   Testing/Procedures: Your physician has requested that you have en exercise stress myoview. For further information please visit [email or web address]. Please follow instruction sheet, as given.   Follow-Up: Your physician recommends that you schedule a follow-up appointment in: 3 months with Dr. Gwenlyn Found.   Any Other Special Instructions Will Be Listed Below (If Applicable).     If you need a refill on your cardiac medications before your next appointment, please call your pharmacy.

## 2018-08-01 NOTE — Assessment & Plan Note (Signed)
History of hyperlipidemia intolerant to statin therapy.  We will explore starting Repatha.

## 2018-08-01 NOTE — Progress Notes (Signed)
08/01/2018 Ronnie Vazquez   16-Jul-1946  [id number]  Primary Physician Lucretia Kern, DO Primary Cardiologist: Lorretta Harp MD Lupe Carney, [place]  HPI:  Ronnie Vazquez is a 72 y.o. mildly overweight married Caucasian male father of 2, grandfather of 5 grandchildren referred by Dr. Maudie Mercury for cardiovascular evaluation because of new onset exertional chest pain.  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.  His risk factors include untreated hyperlipidemia because of statin intolerance.  There is no family history.  He is never had a heart attack or stroke.  He noticed new onset exertional chest tightness 6 to 8 weeks ago when walking up an incline or on the treadmill.  This resolves with stopping the activity.  It does not radiate.   Current Meds  Medication Sig  . Acetaminophen (TYLENOL PO) Take by mouth as needed.  Marland Kitchen aspirin 81 MG tablet Take 81 mg by mouth daily.  Marland Kitchen ibuprofen (ADVIL,MOTRIN) 200 MG tablet Take 200 mg by mouth every 6 (six) hours as needed for pain.  Marland Kitchen OVER THE COUNTER MEDICATION Take 1 tablet by mouth daily as needed. For allergies. [employer] brand     No Known Allergies  Social History   Socioeconomic History  . Marital status: Married    Spouse name: Not on file  . Number of children: Not on file  . Years of education: Not on file  . Highest education level: Not on file  Occupational History  . Not on file  Social Needs  . Financial resource strain: Not on file  . Food insecurity:    Worry: Not on file    Inability: Not on file  . Transportation needs:    Medical: Not on file    Non-medical: Not on file  Tobacco Use  . Smoking status: Never Smoker  . Smokeless tobacco: Never Used  Substance and Sexual Activity  . Alcohol use: Yes    Comment: 2-3 drinks 3-4 days per week  . Drug use: No  . Sexual activity: Not on file  Lifestyle  . Physical activity:    Days per week: Not on file    Minutes per session: Not on file  . Stress: Not on file  Relationships    . Social connections:    Talks on phone: Not on file    Gets together: Not on file    Attends religious service: Not on file    Active member of club or organization: Not on file    Attends meetings of clubs or organizations: Not on file    Relationship status: Not on file  . Intimate partner violence:    Fear of current or ex partner: Not on file    Emotionally abused: Not on file    Physically abused: Not on file    Forced sexual activity: Not on file  Other Topics Concern  . 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  Cardiovascular: negative for chest pain, dyspnea on exertion, edema, orthopnea, palpitations, paroxysmal nocturnal dyspnea or shortness of breath Dermatological: negative for rash Respiratory:  negative for cough or wheezing Urologic: negative for hematuria Abdominal: negative for nausea, vomiting, diarrhea, bright red blood per rectum, melena, or hematemesis Neurologic: negative for visual changes, syncope, or dizziness All other systems reviewed and are otherwise negative except as noted above.    Blood pressure 110/76, pulse 68, height 5\' 10"  (1.778 m), weight 202 lb (91.6 kg).  General appearance: alert and no distress Neck: no adenopathy, no carotid bruit, no JVD, supple, symmetrical, trachea midline and thyroid not enlarged, symmetric, no tenderness/mass/nodules Lungs: clear to auscultation bilaterally Heart: regular rate and rhythm, S1, S2 normal, no murmur, click, rub or gallop Extremities: extremities normal, atraumatic, no cyanosis or edema Pulses: 2+ and symmetric Skin: Skin color, texture, turgor normal. No rashes or lesions Neurologic: Alert and oriented X 3, normal  strength and tone. Normal symmetric reflexes. Normal coordination and gait  EKG not performed today  ASSESSMENT AND PLAN:   Chest pain 6 to 8 weeks history of exertional chest pain that occurs pretty with reproducibly at a specific workload.  His risk factors include hyperlipidemia.  I am going to get an exercise Myoview stress test to further evaluate.  Hyperlipemia History of hyperlipidemia intolerant to statin therapy.  We will explore starting Repatha.      Lorretta Harp MD FACP,FACC,FAHA, [facility] 08/01/2018 11:48 AM

## 2018-08-02 ENCOUNTER — Telehealth (HOSPITAL_COMMUNITY): Payer: Self-pay

## 2018-08-02 NOTE — Telephone Encounter (Signed)
Encounter complete. 

## 2018-08-03 ENCOUNTER — Telehealth (HOSPITAL_COMMUNITY): Payer: Self-pay

## 2018-08-03 NOTE — Telephone Encounter (Signed)
Encounter complete. 

## 2018-08-04 ENCOUNTER — Encounter (HOSPITAL_COMMUNITY): Payer: Self-pay | Admitting: *Deleted

## 2018-08-04 ENCOUNTER — Ambulatory Visit (HOSPITAL_COMMUNITY)
Admission: RE | Admit: 2018-08-04 | Discharge: 2018-08-04 | Disposition: A | Discharge status: Home / Self Care | Payer: Medicare Other | Source: Ambulatory Visit | Attending: Cardiology | Admitting: Cardiology

## 2018-08-04 DIAGNOSIS — R079 Chest pain, unspecified: Secondary | ICD-10-CM | POA: Diagnosis not present

## 2018-08-04 DIAGNOSIS — I208 Other forms of angina pectoris: Secondary | ICD-10-CM | POA: Diagnosis not present

## 2018-08-04 LAB — MYOCARDIAL PERFUSION IMAGING
CHL CUP MPHR: 151 {beats}/min
CHL CUP NUCLEAR SDS: 11
CHL CUP NUCLEAR SSS: 14
CHL CUP RESTING HR STRESS: 60 {beats}/min
CHL RATE OF PERCEIVED EXERTION: 17
CSEPED: 11 min
CSEPEW: 12.9 METS
CSEPHR: 93 %
Exercise duration (sec): 46 s
LV dias vol: 115 mL (ref 62–150)
LV sys vol: 53 mL
Peak HR: 141 {beats}/min
SRS: 3
TID: 1.25

## 2018-08-04 MED ORDER — TECHNETIUM TC 99M TETROFOSMIN IV KIT
32.2000 | PACK | Freq: Once | INTRAVENOUS | Status: AC | PRN
  Administered 2018-08-04: 32.2 via INTRAVENOUS
  Filled 2018-08-04: qty 33

## 2018-08-04 MED ORDER — TECHNETIUM TC 99M TETROFOSMIN IV KIT
11.0000 | PACK | Freq: Once | INTRAVENOUS | Status: AC | PRN
  Administered 2018-08-04: 11 via INTRAVENOUS
  Filled 2018-08-04: qty 11

## 2018-08-04 NOTE — Progress Notes (Signed)
Dr Stanford Breed reviewed Myoview study. Ok d/c pt home. Pt has ROV on August 07, 2018 to discuss MPI results. Pt asymptomatic at time of discharge.

## 2018-08-06 NOTE — H&P (View-Only) (Signed)
Cardiology Office Note   Date:  08/07/2018   ID:  Ronnie Vazquez, DOB 08/07/1946, MRN [id number]  PCP:  Lucretia Kern, DO  Cardiologist: Dr. Gwenlyn Found Chief Complaint  Patient presents with  . Follow-up     History of Present Illness: Ronnie Vazquez is a 72 y.o. male who presents for ongoing assessment and management of chest pain, hyperlipidemia (intolerant to statins), exertional dyspnea.  The patient was seen originally on 08/01/2018 as a new patient by Dr. Gwenlyn Found with exercise Myoview ordered in the setting of risk factors of hyperlipidemia, age, and symptoms.  Consideration for Repatha will be discussed on this office visit.  Stress test completed on 08/04/2018 revealed high risk study, there was a deep effect noted of severe severity in the mid anterior mid anterior septal and apical anterior, apical septal and apex location.  The patient had ST segment depression in V5, V6, II, III, aVF and 1 leads.  He is here to discuss test results and plan cardiac catheterization.  He comes today very nervous as he worried about the appointment and the abnormal stress test all weekend. He denies recurrent chest pain, but has not exerted himself.   Past Medical History:  Diagnosis Date  . Chicken pox   . Hyperlipemia 02/02/2013  . Hyperlipidemia   . Left shoulder pain 02/02/2013  . Microscopic hematuria 02/02/2013  . Vision loss of left eye     Past Surgical History:  Procedure Laterality Date  . TONSILLECTOMY AND ADENOIDECTOMY  1959     Current Outpatient Medications  Medication Sig Dispense Refill  . Acetaminophen (TYLENOL PO) Take by mouth as needed.    Marland Kitchen aspirin 81 MG tablet Take 81 mg by mouth daily.    Marland Kitchen ibuprofen (ADVIL,MOTRIN) 200 MG tablet Take 200 mg by mouth every 6 (six) hours as needed for pain.    Marland Kitchen OVER THE COUNTER MEDICATION Take 1 tablet by mouth daily as needed. For allergies. [employer] brand    . rosuvastatin (CRESTOR) 5 MG tablet Take 1 tablet (5 mg total) by mouth 2 (two) times a week.  24 tablet 3  . diazepam (VALIUM) 10 MG tablet Take the night before procedure and the morning before procedure 2 tablet 0  . nitroGLYCERIN (NITROSTAT) 0.4 MG SL tablet Place 1 tablet (0.4 mg total) under the tongue every 5 (five) minutes as needed for chest pain. 25 tablet 3   No current facility-administered medications for this visit.     Allergies:   Patient has no known allergies.    Social History:  The patient  reports that he has never smoked. He has never used smokeless tobacco. He reports that he drinks alcohol. He reports that he does not use drugs.   Family History:  The patient's family history includes Prostate cancer in his father.    ROS: All other systems are reviewed and negative. Unless otherwise mentioned in H&P    PHYSICAL EXAM: VS:  BP (!) 142/91   Pulse 97   Ht 5\' 10"  (1.778 m)   Wt 201 lb 12.8 oz (91.5 kg)   BMI 28.96 kg/m  , BMI Body mass index is 28.96 kg/m. GEN: Well nourished, well developed, in no acute distress  HEENT: normal  Neck: no JVD, carotid bruits, or masses Cardiac: RRR tachycardic; no murmurs, rubs, or gallops,no edema  Respiratory:  clear to auscultation bilaterally, normal work of breathing GI: soft, nontender, nondistended, + BS MS: no deformity or atrophy  Skin: warm and dry, no rash  Neuro:  Strength and sensation are intact Psych: euthymic mood, full affect   EKG:  07/21/2018 NSR rate of 60 bpm  Recent Labs: No results found for requested labs within last 8760 hours.    Lipid Panel    Component Value Date/Time   CHOL 224 (H) 08/12/2017 0921   TRIG 301.0 (H) 08/12/2017 0921   HDL 42.00 08/12/2017 0921   CHOLHDL 5 08/12/2017 0921   VLDL 60.2 (H) 08/12/2017 0921   LDLDIRECT 117.0 08/12/2017 0921      Wt Readings from Last 3 Encounters:  08/07/18 201 lb 12.8 oz (91.5 kg)  08/04/18 202 lb (91.6 kg)  08/01/18 202 lb (91.6 kg)      Other studies Reviewed: Study Highlights    The left ventricular ejection fraction is  mildly decreased (45-54%).  Nuclear stress EF: 54%.  ST segment depression was noted during stress in the V5, V6, II, III, aVF and I leads.  Blood pressure demonstrated a hypotensive response to exercise.  Defect 1: There is a large defect of severe severity present in the mid anterior, mid anteroseptal, apical anterior, apical septal and apex location.  Findings consistent with ischemia.  This is a high risk study.       ASSESSMENT AND PLAN:  1. Abnormal stress test: High risk NM per report with several areas of anterior ischemia and ST abnormalities in multiple leads. He denies chest pain currently. I have explained the need for cardiac catheterization, and explained the results of the test.   The patient understands that risks include but are not limited to stroke (1 in 1000), death (1 in 1000), kidney failure [usually temporary] (1 in 500), bleeding (1 in 200), allergic reaction [possibly serious] (1 in 200), and agrees to proceed.   I have given him NTG sublingual to use prn. Due to his significant anxiety about the test results and pending cath, he asked for help to calm him down. I have given him Valium 5 mg , one to take tonight before bed and one to take early in the am prior to coming for cardiac cath. He is grateful.  He is scheduled for 7:30 am with Dr. Gwenlyn Found on 08/08/2018.  Multiple questions have been answered.   2. Hypercholesterolemia: He is willing to take Repatha.  He has yet to see pharmacy about this. Continue Crestor until after cath.    Current medicines are reviewed at length with the patient today.    Labs/ tests ordered today include: Cardiac cath and pre cath labs.  Phill Myron. West Pugh, ANP, AACC   08/07/2018 9:32 AM    [facility] [address] [place], [place] [address] Phone: [phone]; Fax: [phone]

## 2018-08-06 NOTE — Progress Notes (Signed)
Cardiology Office Note   Date:  08/07/2018   ID:  Ronnie Vazquez, DOB [birth year], MRN [id number]  PCP:  Lucretia Kern, DO  Cardiologist: Dr. Gwenlyn Found Chief Complaint  Patient presents with  . Follow-up     History of Present Illness: Ronnie Vazquez is a 72 y.o. male who presents for ongoing assessment and management of chest pain, hyperlipidemia (intolerant to statins), exertional dyspnea.  The patient was seen originally on 08/01/2018 as a new patient by Dr. Gwenlyn Found with exercise Myoview ordered in the setting of risk factors of hyperlipidemia, age, and symptoms.  Consideration for Repatha will be discussed on this office visit.  Stress test completed on 08/04/2018 revealed high risk study, there was a deep effect noted of severe severity in the mid anterior mid anterior septal and apical anterior, apical septal and apex location.  The patient had ST segment depression in V5, V6, II, III, aVF and 1 leads.  He is here to discuss test results and plan cardiac catheterization.  He comes today very nervous as he worried about the appointment and the abnormal stress test all weekend. He denies recurrent chest pain, but has not exerted himself.   Past Medical History:  Diagnosis Date  . Chicken pox   . Hyperlipemia 02/02/2013  . Hyperlipidemia   . Left shoulder pain 02/02/2013  . Microscopic hematuria 02/02/2013  . Vision loss of left eye     Past Surgical History:  Procedure Laterality Date  . TONSILLECTOMY AND ADENOIDECTOMY  1959     Current Outpatient Medications  Medication Sig Dispense Refill  . Acetaminophen (TYLENOL PO) Take by mouth as needed.    Marland Kitchen aspirin 81 MG tablet Take 81 mg by mouth daily.    Marland Kitchen ibuprofen (ADVIL,MOTRIN) 200 MG tablet Take 200 mg by mouth every 6 (six) hours as needed for pain.    Marland Kitchen OVER THE COUNTER MEDICATION Take 1 tablet by mouth daily as needed. For allergies. [employer] brand    . rosuvastatin (CRESTOR) 5 MG tablet Take 1 tablet (5 mg total) by mouth 2 (two) times a week.  24 tablet 3  . diazepam (VALIUM) 10 MG tablet Take the night before procedure and the morning before procedure 2 tablet 0  . nitroGLYCERIN (NITROSTAT) 0.4 MG SL tablet Place 1 tablet (0.4 mg total) under the tongue every 5 (five) minutes as needed for chest pain. 25 tablet 3   No current facility-administered medications for this visit.     Allergies:   Patient has no known allergies.    Social History:  The patient  reports that he has never smoked. He has never used smokeless tobacco. He reports that he drinks alcohol. He reports that he does not use drugs.   Family History:  The patient's family history includes Prostate cancer in his father.    ROS: All other systems are reviewed and negative. Unless otherwise mentioned in H&P    PHYSICAL EXAM: VS:  BP (!) 142/91   Pulse 97   Ht 5\' 10"  (1.778 m)   Wt 201 lb 12.8 oz (91.5 kg)   BMI 28.96 kg/m  , BMI Body mass index is 28.96 kg/m. GEN: Well nourished, well developed, in no acute distress  HEENT: normal  Neck: no JVD, carotid bruits, or masses Cardiac: RRR tachycardic; no murmurs, rubs, or gallops,no edema  Respiratory:  clear to auscultation bilaterally, normal work of breathing GI: soft, nontender, nondistended, + BS MS: no deformity or atrophy  Skin: warm and dry, no rash  Neuro:  Strength and sensation are intact Psych: euthymic mood, full affect   EKG:  07/21/2018 NSR rate of 60 bpm  Recent Labs: No results found for requested labs within last 8760 hours.    Lipid Panel    Component Value Date/Time   CHOL 224 (H) 08/12/2017 0921   TRIG 301.0 (H) 08/12/2017 0921   HDL 42.00 08/12/2017 0921   CHOLHDL 5 08/12/2017 0921   VLDL 60.2 (H) 08/12/2017 0921   LDLDIRECT 117.0 08/12/2017 0921      Wt Readings from Last 3 Encounters:  08/07/18 201 lb 12.8 oz (91.5 kg)  08/04/18 202 lb (91.6 kg)  08/01/18 202 lb (91.6 kg)      Other studies Reviewed: Study Highlights    The left ventricular ejection fraction is  mildly decreased (45-54%).  Nuclear stress EF: 54%.  ST segment depression was noted during stress in the V5, V6, II, III, aVF and I leads.  Blood pressure demonstrated a hypotensive response to exercise.  Defect 1: There is a large defect of severe severity present in the mid anterior, mid anteroseptal, apical anterior, apical septal and apex location.  Findings consistent with ischemia.  This is a high risk study.       ASSESSMENT AND PLAN:  1. Abnormal stress test: High risk NM per report with several areas of anterior ischemia and ST abnormalities in multiple leads. He denies chest pain currently. I have explained the need for cardiac catheterization, and explained the results of the test.   The patient understands that risks include but are not limited to stroke (1 in 1000), death (1 in 1000), kidney failure [usually temporary] (1 in 500), bleeding (1 in 200), allergic reaction [possibly serious] (1 in 200), and agrees to proceed.   I have given him NTG sublingual to use prn. Due to his significant anxiety about the test results and pending cath, he asked for help to calm him down. I have given him Valium 5 mg , one to take tonight before bed and one to take early in the am prior to coming for cardiac cath. He is grateful.  He is scheduled for 7:30 am with Dr. Gwenlyn Found on 08/08/2018.  Multiple questions have been answered.   2. Hypercholesterolemia: He is willing to take Repatha.  He has yet to see pharmacy about this. Continue Crestor until after cath.    Current medicines are reviewed at length with the patient today.    Labs/ tests ordered today include: Cardiac cath and pre cath labs.  Phill Myron. West Pugh, ANP, AACC   08/07/2018 9:32 AM    [facility] [address] [place], [place] [address] Phone: [phone]; Fax: [phone]

## 2018-08-07 ENCOUNTER — Encounter: Payer: Self-pay | Admitting: Adult Health

## 2018-08-07 ENCOUNTER — Ambulatory Visit (INDEPENDENT_AMBULATORY_CARE_PROVIDER_SITE_OTHER): Payer: Medicare Other | Admitting: Adult Health

## 2018-08-07 VITALS — BP 142/91 | HR 97 | Ht 70.0 in | Wt 201.8 lb

## 2018-08-07 DIAGNOSIS — I208 Other forms of angina pectoris: Secondary | ICD-10-CM | POA: Diagnosis not present

## 2018-08-07 DIAGNOSIS — R9439 Abnormal result of other cardiovascular function study: Secondary | ICD-10-CM | POA: Diagnosis not present

## 2018-08-07 DIAGNOSIS — Z79899 Other long term (current) drug therapy: Secondary | ICD-10-CM | POA: Diagnosis not present

## 2018-08-07 DIAGNOSIS — E7800 Pure hypercholesterolemia, unspecified: Secondary | ICD-10-CM

## 2018-08-07 DIAGNOSIS — F418 Other specified anxiety disorders: Secondary | ICD-10-CM

## 2018-08-07 LAB — BASIC METABOLIC PANEL
BUN/Creatinine Ratio: 17 (ref 10–24)
BUN: 23 mg/dL (ref 8–27)
CALCIUM: 10.1 mg/dL (ref 8.6–10.2)
CHLORIDE: 102 mmol/L (ref 96–106)
CO2: 22 mmol/L (ref 20–29)
Creatinine, Ser: 1.37 mg/dL — ABNORMAL HIGH (ref 0.76–1.27)
GFR calc Af Amer: 60 mL/min/{1.73_m2} (ref >59)
GFR calc non Af Amer: 52 mL/min/{1.73_m2} — ABNORMAL LOW (ref >59)
GLUCOSE: 93 mg/dL (ref 65–99)
Potassium: 5 mmol/L (ref 3.5–5.2)
Sodium: 138 mmol/L (ref 134–144)

## 2018-08-07 LAB — CBC
HEMATOCRIT: 43.6 % (ref 37.5–51.0)
HEMOGLOBIN: 15 g/dL (ref 13.0–17.7)
MCH: 32.7 pg (ref 26.6–33.0)
MCHC: 34.4 g/dL (ref 31.5–35.7)
MCV: 95 fL (ref 79–97)
Platelets: 236 10*3/uL (ref 150–450)
RBC: 4.59 x10E6/uL (ref 4.14–5.80)
RDW: 13.3 % (ref 12.3–15.4)
WBC: 6.8 10*3/uL (ref 3.4–10.8)

## 2018-08-07 MED ORDER — DIAZEPAM 10 MG PO TABS: ORAL_TABLET | ORAL | 0 refills | Status: DC

## 2018-08-07 MED ORDER — NITROGLYCERIN 0.4 MG SL SUBL: 0.4000 mg | SUBLINGUAL_TABLET | SUBLINGUAL | 3 refills | Status: DC | PRN

## 2018-08-07 NOTE — Patient Instructions (Addendum)
Ronnie Vazquez  08/07/2018  You are scheduled for a Cardiac Catheterization on Monday, August 13 with Dr. Quay Burow. 1. Please arrive at the [facility] (Main Entrance A) at [facility]: [address] [place], [place] [address] at 5:30 AM (This time is two hours before your procedure to ensure your preparation). Free valet parking service is available.   Special note: Every effort is made to have your procedure done on time. Please understand that emergencies sometimes delay scheduled procedures.  2. Diet: Do not eat solid foods after midnight.  The patient may have clear liquids until 5am upon the day of the procedure.  3. Labs: You will need to have blood drawn on Monday, August 12 at [facility]  Open: 8am - 5pm (Lunch 12:30 - 1:30)   Phone: [phone]. You do not need to be fasting.  4. On the morning of your procedure, take your Aspirin and any morning medicines NOT listed above. Take VALIUM THE NIGHT TO HELP YOU SLEEP AND THEN when you are leaving  the house, MAKE SURE THAT YOUR WIFE IS DRIVING!  You may use sips of water.   5. Plan for one night stay--bring personal belongings. 6. Bring a current list of your medications and current insurance cards. 7. You MUST have a responsible person to drive you home. 8. Someone MUST be with you the first 24 hours after you arrive home or your discharge will be delayed. 9. Please wear clothes that are easy to get on and off and wear slip-on shoes.  Thank you for allowing [place] to care for you!   -- [facility]  Coronary Angiogram With Stent Coronary angiogram with stent placement is a procedure to widen or open a narrow blood vessel of the heart (coronary artery). Arteries may become blocked by cholesterol buildup (plaques) in the lining of the wall. When a coronary artery becomes partially blocked, blood flow to that area decreases. This may lead to chest pain or a  heart attack (myocardial infarction). A stent is a small piece of metal that looks like mesh or a spring. Stent placement may be done as treatment for a heart attack or right after a coronary angiogram in which a blocked artery is found. Let your health care provider know about:  Any allergies you have.  All medicines you are taking, including vitamins, herbs, eye drops, creams, and over-the-counter medicines.  Any problems you or family members have had with anesthetic medicines.  Any blood disorders you have.  Any surgeries you have had.  Any medical conditions you have.  Whether you are pregnant or may be pregnant. What are the risks? Generally, this is a safe procedure. However, problems may occur, including:  Damage to the heart or its blood vessels.  A return of blockage.  Bleeding, infection, or bruising at the insertion site.  A collection of blood under the skin (hematoma) at the insertion site.  A blood clot in another part of the body.  Kidney injury.  Allergic reaction to the dye or contrast that is used.  Bleeding into the abdomen (retroperitoneal bleeding).  What happens before the procedure? Staying hydrated Follow instructions from your health care provider about hydration, which may include:  Up to 2 hours before the procedure - you may continue to drink clear liquids, such as water, clear fruit juice, black coffee, and plain tea.  Eating and drinking restrictions Follow instructions from your health care provider  about eating and drinking, which may include:  8 hours before the procedure - stop eating heavy meals or foods such as meat, fried foods, or fatty foods.  6 hours before the procedure - stop eating light meals or foods, such as toast or cereal.  2 hours before the procedure - stop drinking clear liquids.  Ask your health care provider about:  Changing or stopping your regular medicines. This is especially important if you are taking  diabetes medicines or blood thinners.  Taking medicines such as ibuprofen. These medicines can thin your blood. Do not take these medicines before your procedure if your health care provider instructs you not to. Generally, aspirin is recommended before a procedure of passing a small, thin tube (catheter) through a blood vessel and into the heart (cardiac catheterization).  What happens during the procedure?  An IV tube will be inserted into one of your veins.  You will be given one or more of the following: ? A medicine to help you relax (sedative). ? A medicine to numb the area where the catheter will be inserted into an artery (local anesthetic).  To reduce your risk of infection: ? Your health care team will wash or sanitize their hands. ? Your skin will be washed with soap. ? Hair may be removed from the area where the catheter will be inserted.  Using a guide wire, the catheter will be inserted into an artery. The location may be in your groin, in your wrist, or in the fold of your arm (near your elbow).  A type of X-ray (fluoroscopy) will be used to help guide the catheter to the opening of the arteries in the heart.  A dye will be injected into the catheter, and X-rays will be taken. The dye will help to show where any narrowing or blockages are located in the arteries.  A tiny wire will be guided to the blocked spot, and a balloon will be inflated to make the artery wider.  The stent will be expanded and will crush the plaques into the wall of the vessel. The stent will hold the area open and improve the blood flow. Most stents have a drug coating to reduce the risk of the stent narrowing over time.  The artery may be made wider using a drill, laser, or other tools to remove plaques.  When the blood flow is better, the catheter will be removed. The lining of the artery will grow over the stent, which stays where it was placed. This procedure may vary among health care providers  and hospitals. What happens after the procedure?  If the procedure is done through the leg, you will be kept in bed lying flat for about 6 hours. You will be instructed to not bend and not cross your legs.  The insertion site will be checked frequently.  The pulse in your foot or wrist will be checked frequently.  You may have additional blood tests, X-rays, and a test that records the electrical activity of your heart (electrocardiogram, or ECG). This information is not intended to replace advice given to you by your health care provider. Make sure you discuss any questions you have with your health care provider. Document Released: 06/19/2003 Document Revised: 08/12/2016 Document Reviewed: 07/18/2016 Elsevier Interactive Patient Education  [employer].

## 2018-08-08 ENCOUNTER — Encounter (HOSPITAL_COMMUNITY)
Admission: RE | Disposition: A | Discharge status: Home / Self Care | Payer: Self-pay | Source: Ambulatory Visit | Attending: Cardiovascular Disease

## 2018-08-08 ENCOUNTER — Ambulatory Visit (HOSPITAL_COMMUNITY)
Admission: RE | Admit: 2018-08-08 | Discharge: 2018-08-08 | Disposition: A | Discharge status: Home / Self Care | Payer: Medicare Other | Source: Ambulatory Visit | Attending: Cardiovascular Disease | Admitting: Cardiovascular Disease

## 2018-08-08 ENCOUNTER — Other Ambulatory Visit: Payer: Self-pay

## 2018-08-08 ENCOUNTER — Encounter (HOSPITAL_COMMUNITY): Payer: Self-pay | Admitting: Cardiovascular Disease

## 2018-08-08 DIAGNOSIS — H5462 Unqualified visual loss, left eye, normal vision right eye: Secondary | ICD-10-CM | POA: Insufficient documentation

## 2018-08-08 DIAGNOSIS — E663 Overweight: Secondary | ICD-10-CM | POA: Diagnosis not present

## 2018-08-08 DIAGNOSIS — Z6828 Body mass index (BMI) 28.0-28.9, adult: Secondary | ICD-10-CM | POA: Insufficient documentation

## 2018-08-08 DIAGNOSIS — E7800 Pure hypercholesterolemia, unspecified: Secondary | ICD-10-CM | POA: Insufficient documentation

## 2018-08-08 DIAGNOSIS — I2510 Atherosclerotic heart disease of native coronary artery without angina pectoris: Secondary | ICD-10-CM | POA: Diagnosis not present

## 2018-08-08 DIAGNOSIS — R9439 Abnormal result of other cardiovascular function study: Secondary | ICD-10-CM

## 2018-08-08 DIAGNOSIS — Z7982 Long term (current) use of aspirin: Secondary | ICD-10-CM | POA: Insufficient documentation

## 2018-08-08 DIAGNOSIS — R0789 Other chest pain: Secondary | ICD-10-CM | POA: Diagnosis not present

## 2018-08-08 HISTORY — PX: LEFT HEART CATH AND CORONARY ANGIOGRAPHY: CATH118249

## 2018-08-08 SURGERY — LEFT HEART CATH AND CORONARY ANGIOGRAPHY
Anesthesia: LOCAL

## 2018-08-08 MED ORDER — SODIUM CHLORIDE 0.9% FLUSH: 3.0000 mL | Freq: Two times a day (BID) | INTRAVENOUS | Status: DC

## 2018-08-08 MED ORDER — SODIUM CHLORIDE 0.9% FLUSH: 3.0000 mL | INTRAVENOUS | Status: DC | PRN

## 2018-08-08 MED ORDER — ACETAMINOPHEN 325 MG PO TABS: 650.0000 mg | ORAL_TABLET | ORAL | Status: DC | PRN

## 2018-08-08 MED ORDER — LIDOCAINE HCL (PF) 1 % IJ SOLN
INTRAMUSCULAR | Status: AC
  Filled 2018-08-08: qty 30

## 2018-08-08 MED ORDER — SODIUM CHLORIDE 0.9 % IV SOLN: 250.0000 mL | INTRAVENOUS | Status: DC | PRN

## 2018-08-08 MED ORDER — SODIUM CHLORIDE 0.9 % WEIGHT BASED INFUSION: 1.0000 mL/kg/h | INTRAVENOUS | Status: DC

## 2018-08-08 MED ORDER — HEPARIN (PORCINE) IN NACL 1000-0.9 UT/500ML-% IV SOLN
INTRAVENOUS | Status: DC | PRN
  Administered 2018-08-08 (×2): 500 mL

## 2018-08-08 MED ORDER — ONDANSETRON HCL 4 MG/2ML IJ SOLN: 4.0000 mg | Freq: Four times a day (QID) | INTRAMUSCULAR | Status: DC | PRN

## 2018-08-08 MED ORDER — IOHEXOL 350 MG/ML SOLN
INTRAVENOUS | Status: DC | PRN
  Administered 2018-08-08: 110 mL via INTRAVENOUS

## 2018-08-08 MED ORDER — FENTANYL CITRATE (PF) 100 MCG/2ML IJ SOLN
INTRAMUSCULAR | Status: DC | PRN
  Administered 2018-08-08: 25 ug via INTRAVENOUS

## 2018-08-08 MED ORDER — LIDOCAINE HCL (PF) 1 % IJ SOLN
INTRAMUSCULAR | Status: DC | PRN
  Administered 2018-08-08: 2 mL

## 2018-08-08 MED ORDER — HEPARIN SODIUM (PORCINE) 1000 UNIT/ML IJ SOLN
INTRAMUSCULAR | Status: AC
  Filled 2018-08-08: qty 1

## 2018-08-08 MED ORDER — ASPIRIN 81 MG PO CHEW: 81.0000 mg | CHEWABLE_TABLET | Freq: Every day | ORAL | Status: DC

## 2018-08-08 MED ORDER — MORPHINE SULFATE (PF) 10 MG/ML IV SOLN: 2.0000 mg | INTRAVENOUS | Status: DC | PRN

## 2018-08-08 MED ORDER — SODIUM CHLORIDE 0.9 % WEIGHT BASED INFUSION
3.0000 mL/kg/h | INTRAVENOUS | Status: AC
  Administered 2018-08-08: 3 mL/kg/h via INTRAVENOUS

## 2018-08-08 MED ORDER — MIDAZOLAM HCL 2 MG/2ML IJ SOLN
INTRAMUSCULAR | Status: DC | PRN
  Administered 2018-08-08: 1 mg via INTRAVENOUS

## 2018-08-08 MED ORDER — SODIUM CHLORIDE 0.9 % IV SOLN: INTRAVENOUS | Status: AC

## 2018-08-08 MED ORDER — FENTANYL CITRATE (PF) 100 MCG/2ML IJ SOLN
INTRAMUSCULAR | Status: AC
Start: 2018-08-08 — End: ?
  Filled 2018-08-08: qty 2

## 2018-08-08 MED ORDER — MIDAZOLAM HCL 2 MG/2ML IJ SOLN
INTRAMUSCULAR | Status: AC
  Filled 2018-08-08: qty 2

## 2018-08-08 MED ORDER — NITROGLYCERIN 1 MG/10 ML FOR IR/CATH LAB
INTRA_ARTERIAL | Status: AC
  Filled 2018-08-08: qty 10

## 2018-08-08 MED ORDER — VERAPAMIL HCL 2.5 MG/ML IV SOLN
INTRA_ARTERIAL | Status: DC | PRN
  Administered 2018-08-08: 15 mL via INTRA_ARTERIAL

## 2018-08-08 MED ORDER — VERAPAMIL HCL 2.5 MG/ML IV SOLN
INTRAVENOUS | Status: AC
  Filled 2018-08-08: qty 2

## 2018-08-08 MED ORDER — HEPARIN SODIUM (PORCINE) 1000 UNIT/ML IJ SOLN
INTRAMUSCULAR | Status: DC | PRN
  Administered 2018-08-08: 4500 [IU] via INTRAVENOUS

## 2018-08-08 SURGICAL SUPPLY — 12 items
CATH 5FR JL3.5 JR4 ANG PIG MP (CATHETERS) ×2 IMPLANT
CATH OPTITORQUE TIG 4.0 5F (CATHETERS) ×2 IMPLANT
DEVICE RAD COMP TR BAND LRG (VASCULAR PRODUCTS) ×2 IMPLANT
GLIDESHEATH SLEND A-KIT 6F 22G (SHEATH) ×2 IMPLANT
GUIDEWIRE INQWIRE 1.5J.035X260 (WIRE) ×1 IMPLANT
INQWIRE 1.5J .035X260CM (WIRE) ×2
KIT HEART LEFT (KITS) ×2 IMPLANT
PACK CARDIAC CATHETERIZATION (CUSTOM PROCEDURE TRAY) ×2 IMPLANT
SYR MEDRAD MARK V 150ML (SYRINGE) ×2 IMPLANT
TRANSDUCER W/STOPCOCK (MISCELLANEOUS) ×2 IMPLANT
TUBING CIL FLEX 10 FLL-RA (TUBING) ×2 IMPLANT
WIRE HI TORQ VERSACORE-J 145CM (WIRE) ×2 IMPLANT

## 2018-08-08 NOTE — Discharge Instructions (Signed)

## 2018-08-08 NOTE — Interval H&P Note (Signed)
Cath Lab Visit (complete for each Cath Lab visit)  Clinical Evaluation Leading to the Procedure:   ACS: No.  Non-ACS:    Anginal Classification: CCS II  Anti-ischemic medical therapy: No Therapy  Non-Invasive Test Results: High-risk stress test findings: cardiac mortality >3%/year  Prior CABG: No previous CABG      History and Physical Interval Note:  08/08/2018 7:40 AM  Ronnie Vazquez  has presented today for surgery, with the diagnosis of abn stress  The various methods of treatment have been discussed with the patient and family. After consideration of risks, benefits and other options for treatment, the patient has consented to  Procedure(s): LEFT HEART CATH AND CORONARY ANGIOGRAPHY (N/A) as a surgical intervention .  The patient's history has been reviewed, patient examined, no change in status, stable for surgery.  I have reviewed the patient's chart and labs.  Questions were answered to the patient's satisfaction.     Quay Burow

## 2018-08-08 NOTE — Progress Notes (Signed)
Took aspirin this am at home at [facility]

## 2018-08-09 ENCOUNTER — Encounter: Payer: Self-pay | Admitting: Cardiothoracic Surgery

## 2018-08-09 ENCOUNTER — Institutional Professional Consult (permissible substitution) (INDEPENDENT_AMBULATORY_CARE_PROVIDER_SITE_OTHER): Payer: Medicare Other | Admitting: Cardiothoracic Surgery

## 2018-08-09 ENCOUNTER — Other Ambulatory Visit: Payer: Self-pay | Admitting: *Deleted

## 2018-08-09 VITALS — BP 112/76 | HR 90 | Resp 16 | Ht 70.5 in | Wt 200.0 lb

## 2018-08-09 DIAGNOSIS — I2510 Atherosclerotic heart disease of native coronary artery without angina pectoris: Secondary | ICD-10-CM | POA: Diagnosis not present

## 2018-08-09 MED ORDER — DIAZEPAM 5 MG PO TABS: 5.0000 mg | ORAL_TABLET | Freq: Two times a day (BID) | ORAL | 0 refills | Status: DC | PRN

## 2018-08-09 MED ORDER — DIAZEPAM 10 MG PO TABS: ORAL_TABLET | ORAL | 0 refills | Status: DC

## 2018-08-09 NOTE — Progress Notes (Signed)
PCP is Lucretia Kern, DO Referring Provider is Lorretta Harp, MD  Chief Complaint  Patient presents with  . Coronary Artery Disease    Surgical eval, Cardiac Cath 08/08/18,     Patient examined, images of cardiac catheterization and echocardiogram personally reviewed and counseled with patient HPI: 72 year old previously healthy male without significant past medical history or surgery presents for evaluation of recently diagnosed severe two-vessel coronary artery disease.  Patient has had exertional class III angina for the past few weeks relieved by rest.  A stress test was positive and cardiac catheterization via right radial artery yesterday by Dr. Quay Burow demonstrated 90% stenosis at the LAD diagonal bifurcation.  The circumflex vessel was nondominant and a branch of the right coronary artery which was hyperdominant.  LVEDP and LV function are normal.  No valvular dysfunction on echocardiogram.  Patient has been intolerant to statins.  He takes aspirin daily.  He is not currently on a beta-blocker.  Past Medical History:  Diagnosis Date  . Chicken pox   . Hyperlipemia 02/02/2013  . Hyperlipidemia   . Left shoulder pain 02/02/2013  . Microscopic hematuria 02/02/2013  . Vision loss of left eye     Past Surgical History:  Procedure Laterality Date  . LEFT HEART CATH AND CORONARY ANGIOGRAPHY N/A 08/08/2018   Procedure: LEFT HEART CATH AND CORONARY ANGIOGRAPHY;  Surgeon: Lorretta Harp, MD;  Location: [facility];  Service: Cardiovascular;  Laterality: N/A;  . TONSILLECTOMY AND ADENOIDECTOMY  1959    Family History  Problem Relation Age of Onset  . Prostate cancer Father   . Colon cancer Neg Hx   . Esophageal cancer Neg Hx   . Pancreatic cancer Neg Hx   . Rectal cancer Neg Hx   . Stomach cancer Neg Hx   . Heart disease Neg Hx     Social History Social History   Tobacco Use  . Smoking status: Never Smoker  . Smokeless tobacco: Never Used  Substance Use Topics   . Alcohol use: Yes    Comment: 2-3 drinks 3-4 days per week  . Drug use: No    Current Outpatient Medications  Medication Sig Dispense Refill  . acetaminophen (TYLENOL) 325 MG tablet Take by mouth as needed.     Marland Kitchen aspirin 81 MG tablet Take 81 mg by mouth daily.    . cetirizine (ZYRTEC) 10 MG tablet Take 10 mg by mouth daily as needed for allergies.    Marland Kitchen ibuprofen (ADVIL,MOTRIN) 200 MG tablet Take 400 mg by mouth at bedtime.     . nitroGLYCERIN (NITROSTAT) 0.4 MG SL tablet Place 1 tablet (0.4 mg total) under the tongue every 5 (five) minutes as needed for chest pain. 25 tablet 3  . diazepam (VALIUM) 10 MG tablet Take the night before procedure and the morning before procedure 2 tablet 0  . diazepam (VALIUM) 5 MG tablet Take 1 tablet (5 mg total) by mouth every 12 (twelve) hours as needed for anxiety. 10 tablet 0  . rosuvastatin (CRESTOR) 5 MG tablet Take 1 tablet (5 mg total) by mouth 2 (two) times a week. (Patient not taking: Reported on 08/09/2018) 24 tablet 3   No current facility-administered medications for this visit.     No Known Allergies  Review of Systems  Weight stable History of transient visual symptoms 2015 evaluated by carotid ultrasound which was negative and ophthalmology exam which was normal.  No recurrent symptoms. Patient has had previous colonoscopy and biopsy without  complication but no general anesthesia. Patient is right-hand dominant. Patient denies previous injury to the thorax, rib fracture or pneumothorax Patient denies smoking.  Social alcohol intake. No history of leg swelling DVT or varicose veins No active dental complaints or difficulty swallowing   BP 112/76 (BP Location: Right Arm, Patient Position: Sitting, Cuff Size: Large)   Pulse 90   Resp 16   Ht 5' 10.5" (1.791 m)   Wt 200 lb (90.7 kg)   SpO2 96% Comment: RA  BMI 28.29 kg/m  Physical Exam      Physical Exam  General: Well-developed middle-aged male no acute distress accompanied by  wife HEENT: Normocephalic pupils equal , dentition adequate Neck: Supple without JVD, adenopathy, or bruit Chest: Clear to auscultation, symmetrical breath sounds, no rhonchi, no tenderness             or deformity Cardiovascular: Regular rate and rhythm, no murmur, no gallop, peripheral pulses             palpable in all extremities Abdomen:  Soft, nontender, no palpable mass or organomegaly Extremities: Warm, well-perfused, no clubbing cyanosis edema or tenderness,              no venous stasis changes of the legs Rectal/GU: Deferred Neuro: Grossly non--focal and symmetrical throughout Skin: Clean and dry without rash or ulceration  Diagnostic Tests: Coronary arteriograms show a high-grade 90% stenosis at the LAD diagonal bifurcation which is functionally his left main as the circumflex is small nondominant and a branch of the right coronary.  Impression: Patient would benefit from CABG.  He would benefit from arterial grafting to the LAD and diagonal.  If the left IMA is a large vessel the revascularization could potentially be done as a sequential graft.  If the mammary artery is less than 2 mm will plan using arterial graft with radial artery as well as LIMA.  The small circumflex vessel has a moderate stenosis and is not an issue.  Plan: Surgery scheduled for Monday, August 19 at [facility].  I discussed the procedure CABG in detail with the patient and his family including benefits alternatives and risks.  He agrees to proceed.  Len Childs, MD [facility] [phone]

## 2018-08-10 ENCOUNTER — Ambulatory Visit (HOSPITAL_BASED_OUTPATIENT_CLINIC_OR_DEPARTMENT_OTHER)
Admission: RE | Admit: 2018-08-10 | Discharge: 2018-08-10 | Disposition: A | Discharge status: Home / Self Care | Payer: Medicare Other | Source: Ambulatory Visit | Attending: Cardiothoracic Surgery | Admitting: Cardiothoracic Surgery

## 2018-08-10 ENCOUNTER — Encounter (HOSPITAL_COMMUNITY)
Admission: RE | Admit: 2018-08-10 | Discharge: 2018-08-10 | Disposition: A | Discharge status: Home / Self Care | Payer: Medicare Other | Source: Ambulatory Visit | Attending: Cardiothoracic Surgery | Admitting: Cardiothoracic Surgery

## 2018-08-10 ENCOUNTER — Ambulatory Visit (HOSPITAL_COMMUNITY)
Admission: RE | Admit: 2018-08-10 | Discharge: 2018-08-10 | Disposition: A | Discharge status: Home / Self Care | Payer: Medicare Other | Source: Ambulatory Visit | Attending: Cardiothoracic Surgery | Admitting: Cardiothoracic Surgery

## 2018-08-10 ENCOUNTER — Encounter (HOSPITAL_COMMUNITY): Payer: Self-pay

## 2018-08-10 ENCOUNTER — Other Ambulatory Visit: Payer: Self-pay

## 2018-08-10 DIAGNOSIS — Z01812 Encounter for preprocedural laboratory examination: Secondary | ICD-10-CM | POA: Insufficient documentation

## 2018-08-10 DIAGNOSIS — I6523 Occlusion and stenosis of bilateral carotid arteries: Secondary | ICD-10-CM | POA: Diagnosis not present

## 2018-08-10 DIAGNOSIS — I2510 Atherosclerotic heart disease of native coronary artery without angina pectoris: Secondary | ICD-10-CM | POA: Insufficient documentation

## 2018-08-10 DIAGNOSIS — Z01818 Encounter for other preprocedural examination: Secondary | ICD-10-CM | POA: Diagnosis not present

## 2018-08-10 DIAGNOSIS — Z0183 Encounter for blood typing: Secondary | ICD-10-CM | POA: Diagnosis not present

## 2018-08-10 DIAGNOSIS — R0789 Other chest pain: Secondary | ICD-10-CM | POA: Diagnosis not present

## 2018-08-10 HISTORY — DX: Atherosclerotic heart disease of native coronary artery without angina pectoris: I25.10

## 2018-08-10 LAB — TYPE AND SCREEN
ABO/RH(D): O POS
Antibody Screen: NEGATIVE

## 2018-08-10 LAB — URINALYSIS, ROUTINE W REFLEX MICROSCOPIC
Bacteria, UA: NONE SEEN
Bilirubin Urine: NEGATIVE
Glucose, UA: NEGATIVE mg/dL
Ketones, ur: NEGATIVE mg/dL
Leukocytes, UA: NEGATIVE
Nitrite: NEGATIVE
Protein, ur: NEGATIVE mg/dL
Specific Gravity, Urine: 1.018 (ref 1.005–1.030)
pH: 6 (ref 5.0–8.0)

## 2018-08-10 LAB — BLOOD GAS, ARTERIAL
Acid-Base Excess: 1.2 mmol/L (ref 0.0–2.0)
Bicarbonate: 24.5 mmol/L (ref 20.0–28.0)
Drawn by: 470591
FIO2: 21
O2 Saturation: 87.6 %
Patient temperature: 98.6
pCO2 arterial: 33.9 mmHg (ref 32.0–48.0)
pH, Arterial: 7.472 — ABNORMAL HIGH (ref 7.350–7.450)
pO2, Arterial: 51 mmHg — ABNORMAL LOW (ref 83.0–108.0)

## 2018-08-10 LAB — SURGICAL PCR SCREEN
MRSA, PCR: NEGATIVE
Staphylococcus aureus: NEGATIVE

## 2018-08-10 LAB — CBC
HCT: 44.6 % (ref 39.0–52.0)
Hemoglobin: 14.6 g/dL (ref 13.0–17.0)
MCH: 32.5 pg (ref 26.0–34.0)
MCHC: 32.7 g/dL (ref 30.0–36.0)
MCV: 99.3 fL (ref 78.0–100.0)
Platelets: 212 10*3/uL (ref 150–400)
RBC: 4.49 MIL/uL (ref 4.22–5.81)
RDW: 12.1 % (ref 11.5–15.5)
WBC: 5.6 10*3/uL (ref 4.0–10.5)

## 2018-08-10 LAB — COMPREHENSIVE METABOLIC PANEL
ALT: 31 U/L (ref 0–44)
AST: 30 U/L (ref 15–41)
Albumin: 3.9 g/dL (ref 3.5–5.0)
Alkaline Phosphatase: 40 U/L (ref 38–126)
Anion gap: 8 (ref 5–15)
BUN: 21 mg/dL (ref 8–23)
CO2: 23 mmol/L (ref 22–32)
Calcium: 9.4 mg/dL (ref 8.9–10.3)
Chloride: 107 mmol/L (ref 98–111)
Creatinine, Ser: 1.33 mg/dL — ABNORMAL HIGH (ref 0.61–1.24)
GFR calc Af Amer: >60 mL/min (ref >60)
GFR calc non Af Amer: 53 mL/min — ABNORMAL LOW (ref >60)
Glucose, Bld: 101 mg/dL — ABNORMAL HIGH (ref 70–99)
Potassium: 4.2 mmol/L (ref 3.5–5.1)
Sodium: 138 mmol/L (ref 135–145)
Total Bilirubin: 0.8 mg/dL (ref 0.3–1.2)
Total Protein: 6.6 g/dL (ref 6.5–8.1)

## 2018-08-10 LAB — PULMONARY FUNCTION TEST
DL/VA % pred: 71 %
DL/VA: 3.27 ml/min/mmHg/L
DLCO cor % pred: 53 %
DLCO cor: 17.26 ml/min/mmHg
DLCO unc % pred: 54 %
DLCO unc: 17.49 ml/min/mmHg
FEF 25-75 Post: 4.03 L/sec
FEF 25-75 Pre: 4.02 L/sec
FEF2575-%Change-Post: 0 %
FEF2575-%Pred-Post: 161 %
FEF2575-%Pred-Pre: 161 %
FEV1-%Change-Post: 0 %
FEV1-%Pred-Post: 102 %
FEV1-%Pred-Pre: 103 %
FEV1-Post: 3.34 L
FEV1-Pre: 3.37 L
FEV1FVC-%Change-Post: 2 %
FEV1FVC-%Pred-Pre: 115 %
FEV6-%Change-Post: -3 %
FEV6-%Pred-Post: 91 %
FEV6-%Pred-Pre: 94 %
FEV6-Post: 3.82 L
FEV6-Pre: 3.95 L
FEV6FVC-%Change-Post: 0 %
FEV6FVC-%Pred-Post: 106 %
FEV6FVC-%Pred-Pre: 106 %
FVC-%Change-Post: -3 %
FVC-%Pred-Post: 86 %
FVC-%Pred-Pre: 88 %
FVC-Post: 3.82 L
FVC-Pre: 3.95 L
Post FEV1/FVC ratio: 87 %
Post FEV6/FVC ratio: 100 %
Pre FEV1/FVC ratio: 85 %
Pre FEV6/FVC Ratio: 100 %
RV % pred: 73 %
RV: 1.8 L
TLC % pred: 80 %
TLC: 5.66 L

## 2018-08-10 LAB — PROTIME-INR
INR: 1.05
Prothrombin Time: 13.6 seconds (ref 11.4–15.2)

## 2018-08-10 LAB — APTT: aPTT: 33 seconds (ref 24–36)

## 2018-08-10 LAB — HEMOGLOBIN A1C
Hgb A1c MFr Bld: 5.4 % (ref 4.8–5.6)
Mean Plasma Glucose: 108.28 mg/dL

## 2018-08-10 LAB — ABO/RH: ABO/RH(D): O POS

## 2018-08-10 MED ORDER — ALBUTEROL SULFATE (2.5 MG/3ML) 0.083% IN NEBU
2.5000 mg | INHALATION_SOLUTION | Freq: Once | RESPIRATORY_TRACT | Status: AC
  Administered 2018-08-10: 2.5 mg via RESPIRATORY_TRACT

## 2018-08-10 NOTE — Progress Notes (Signed)
PCP - Colin Benton Cardiologist - [place]  Chest x-ray - 08/10/18 EKG - 07/21/18 Stress Test - 2019 ECHO - 2014 Cardiac [id number] -    Aspirin Instructions: continue but do not take the morning of surgery  Anesthesia review: yes. Hx CAD  Patient denies shortness of breath, fever, cough and chest pain at PAT appointment   Patient verbalized understanding of instructions that were given to them at the PAT appointment. Patient was also instructed that they will need to review over the PAT instructions again at home before surgery.

## 2018-08-10 NOTE — Progress Notes (Signed)
Pre cabg workup prelim:  Right Carotid:Velocities in the right ICA are consistent with a 1-39% stenosis.  Left Carotid: Velocities in the left ICA are consistent with a 1-39% stenosis.    Right ABI: Pedal artery waveform within normal limits.  Left ABI: Pedal artery waveform within normal limits.   Bilateral ABI:  Right Upper Extremity: Doppler waveforms decrease >50% with right radial compression. Doppler waveforms remain within normal limits with right ulnar compression.  Left Upper Extremity: Doppler waveforms decrease >50% with left radial compression. Doppler waveforms decrease >50% with left ulnar compression   Landry Mellow, RDMS, RVT

## 2018-08-10 NOTE — Pre-Procedure Instructions (Signed)
Ronnie Vazquez  08/10/2018      [facility], [place] [address] Phone: [phone] Fax: [phone]  [id number] - [place], [place] [place] [address] Phone: [phone] Fax: [phone]    Your procedure is scheduled on August 19  Report to [place] at [place].  Call this number if you have problems the morning of surgery:  [phone]   Remember:  Do not eat or drink after midnight.      Take these medicines the morning of surgery with A SIP OF WATER  acetaminophen (TYLENOL if needed cetirizine (ZYRTEC)  diazepam (VALIUM)  7 days prior to surgery STOP taking any Aleve, Naproxen, Ibuprofen, Motrin, Advil, Goody's, BC's, all herbal medications, fish oil, and all vitamins  Follow your surgeon's instructions on when to stop Asprin.  If no instructions were given by your surgeon then you will need to call the office to get those instructions.       Do not wear jewelry  Do not wear lotions, powders, or cologne, or deodorant.  Men may shave face and neck.  Do not bring valuables to the hospital.  [facility] is not responsible for any belongings or valuables.  Contacts, dentures or bridgework may not be worn into surgery.  Leave your suitcase in the car.  After surgery it may be brought to your room.  For patients admitted to the hospital, discharge time will be determined by your treatment team.  Patients discharged the day of surgery will not be allowed to drive home.    Special instructions:   [place] [facility]- Preparing For Surgery  Before surgery, you can play an important role. Because skin is not sterile, your skin needs to be as free of germs as possible. You can reduce the number of germs on your skin by washing with CHG (chlorahexidine gluconate) Soap before  surgery.  CHG is an antiseptic cleaner which kills germs and bonds with the skin to continue killing germs even after washing.    Oral Hygiene is also important to reduce your risk of infection.  Remember - BRUSH YOUR TEETH THE MORNING OF SURGERY WITH YOUR REGULAR TOOTHPASTE  Please do not use if you have an allergy to CHG or antibacterial soaps. If your skin becomes reddened/irritated stop using the CHG.  Do not shave (including legs and underarms) for at least 48 hours prior to first CHG shower. It is OK to shave your face.  Please follow these instructions carefully.   1. Shower the NIGHT BEFORE SURGERY and the MORNING OF SURGERY with CHG.   2. If you chose to wash your hair, wash your hair first as usual with your normal shampoo.  3. After you shampoo, rinse your hair and body thoroughly to remove the shampoo.  4. Use CHG as you would any other liquid soap. You can apply CHG directly to the skin and wash gently with a scrungie or a clean washcloth.   5. Apply the CHG Soap to your body ONLY FROM THE NECK DOWN.  Do not use on open wounds or open sores. Avoid contact with your eyes, ears, mouth and genitals (private parts). Wash Face and genitals (private parts)  with your normal soap.  6. Wash thoroughly, paying special attention to the area where your surgery will be performed.  7. Thoroughly  rinse your body with warm water from the neck down.  8. DO NOT shower/wash with your normal soap after using and rinsing off the CHG Soap.  9. Pat yourself dry with a CLEAN TOWEL.  10. Wear CLEAN PAJAMAS to bed the night before surgery, wear comfortable clothes the morning of surgery  11. Place CLEAN SHEETS on your bed the night of your first shower and DO NOT SLEEP WITH PETS.    Day of Surgery:  Do not apply any deodorants/lotions.  Please wear clean clothes to the hospital/surgery center.   Remember to brush your teeth WITH YOUR REGULAR TOOTHPASTE.    Please read over the following  fact sheets that you were given.

## 2018-08-11 ENCOUNTER — Ambulatory Visit: Payer: Managed Care, Other (non HMO) | Admitting: Family Medicine

## 2018-08-11 ENCOUNTER — Ambulatory Visit: Payer: Managed Care, Other (non HMO)

## 2018-08-11 ENCOUNTER — Other Ambulatory Visit (HOSPITAL_COMMUNITY): Payer: Medicare Other

## 2018-08-11 ENCOUNTER — Ambulatory Visit (HOSPITAL_COMMUNITY): Payer: Medicare Other

## 2018-08-11 ENCOUNTER — Other Ambulatory Visit: Payer: Self-pay | Admitting: *Deleted

## 2018-08-11 ENCOUNTER — Encounter (HOSPITAL_COMMUNITY): Payer: Self-pay | Admitting: Certified Registered Nurse Anesthetist

## 2018-08-11 ENCOUNTER — Encounter (HOSPITAL_COMMUNITY): Payer: Self-pay | Admitting: Physician Assistant

## 2018-08-11 ENCOUNTER — Encounter (HOSPITAL_COMMUNITY): Payer: Medicare Other

## 2018-08-15 ENCOUNTER — Other Ambulatory Visit: Payer: Self-pay | Admitting: *Deleted

## 2018-08-15 ENCOUNTER — Encounter: Payer: Self-pay | Admitting: Cardiovascular Disease

## 2018-08-15 ENCOUNTER — Ambulatory Visit (INDEPENDENT_AMBULATORY_CARE_PROVIDER_SITE_OTHER): Payer: Medicare Other | Admitting: Cardiovascular Disease

## 2018-08-15 DIAGNOSIS — I2510 Atherosclerotic heart disease of native coronary artery without angina pectoris: Secondary | ICD-10-CM

## 2018-08-15 DIAGNOSIS — I25118 Atherosclerotic heart disease of native coronary artery with other forms of angina pectoris: Secondary | ICD-10-CM | POA: Insufficient documentation

## 2018-08-15 NOTE — Patient Instructions (Signed)
Your physician recommends that you schedule a follow-up appointment in: [place]

## 2018-08-15 NOTE — Progress Notes (Signed)
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  His options include bifurcation stenting versus coronary artery bypass grafting with a LIMA to the LAD and diagonal branch and potentially bypass of the circumflex artery as well.  I have referred him to Dr. Darcey Nora who agrees with this.  I reviewed his angiograms with Drs. Orion Modest and [place] who agreed that bypass surgery would be the best long-term option although technically stenting could also be considered.  We had a long discussion today about his options.  I prefer bypass surgery because of long-term patency of the internal mammary artery and the potential complexity of the bifurcation stenting requiring a 2 stent strategy in the proximal LAD with most likely long-term dual antiplatelet therapy.  Lorretta Harp, M.D., [place], [facility], [place] [address] [place], [place]  [address]  [phone] 08/15/2018 2:42 PM

## 2018-08-15 NOTE — Assessment & Plan Note (Signed)
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  His options include bifurcation stenting versus coronary artery bypass grafting with a LIMA to the LAD and diagonal branch and potentially bypass of the circumflex artery as well.  I have referred him to Dr. Darcey Nora who agrees with this.  I reviewed his angiograms with Drs. Orion Modest and [place] who agreed that bypass surgery would be the best long-term option although technically stenting could also be considered.  We had a long discussion today about his options.  I prefer bypass surgery because of long-term patency of the internal mammary artery and the potential complexity of the bifurcation stenting requiring a 2 stent strategy in the proximal LAD with most likely long-term dual antiplatelet therapy.

## 2018-08-16 ENCOUNTER — Telehealth: Payer: Self-pay

## 2018-08-16 NOTE — Telephone Encounter (Signed)
-----   Message from Sherren Mocha, MD sent at 08/15/2018 10:42 PM EDT ----- I'll get him set up. thx Seward Carol can you put him in for a new consult? 20 minute slot is fine. ----- Message ----- From: Laury Deep, RN Sent: 08/15/2018   3:23 PM EDT To: Sherren Mocha, MD  Patient saw Dr. Gwenlyn Found today and he told him stent wasn't an option.  Patient wants 2nd opinion and wants you b/c you did his neighbor.  Do I get him in to see you or what do I do now??  Thanks, [employer]

## 2018-08-16 NOTE — Telephone Encounter (Signed)
Scheduled patient 8/29 for evaluation with Dr. Burt Knack. Patient agrees with treatment plan.

## 2018-08-17 ENCOUNTER — Encounter: Payer: Self-pay | Admitting: Family Medicine

## 2018-08-17 ENCOUNTER — Ambulatory Visit (INDEPENDENT_AMBULATORY_CARE_PROVIDER_SITE_OTHER): Payer: Medicare Other | Admitting: Family Medicine

## 2018-08-17 ENCOUNTER — Telehealth: Payer: Self-pay | Admitting: Cardiovascular Disease

## 2018-08-17 ENCOUNTER — Telehealth: Payer: Self-pay

## 2018-08-17 VITALS — BP 100/78 | HR 70 | Temp 97.8°F | Ht 70.0 in | Wt 201.1 lb

## 2018-08-17 DIAGNOSIS — F439 Reaction to severe stress, unspecified: Secondary | ICD-10-CM | POA: Diagnosis not present

## 2018-08-17 DIAGNOSIS — R0789 Other chest pain: Secondary | ICD-10-CM | POA: Diagnosis not present

## 2018-08-17 DIAGNOSIS — I2510 Atherosclerotic heart disease of native coronary artery without angina pectoris: Secondary | ICD-10-CM

## 2018-08-17 NOTE — Telephone Encounter (Signed)
New Message    Patient is calling because he is experiencing an issue that he feels like his heart is climbing continuously uphill. He says the beats aren't fast but it just feels like its climbing. He did not complain of any shortness of breath. Please call to discuss.

## 2018-08-17 NOTE — Progress Notes (Signed)
HPI:  Using dictation device. Unfortunately this device frequently misinterprets words/phrases.  Acute visit for chest discomfort: -he has been having some chest discomfort with activities and stress for > then 1 month -he saw cardiology and has CABG scheduled for next week with Dr. Nils Pyle -he continues to have intermittent chest discomfort - never at rest -he is having a hard time knowing if he should take the nitroglycerin or the valium for this as he is unsure if it is anxiety related or heart related -he called his cardiology office and they told him to see his PCP -he has not taken any of either the nitroglycerin or the valium his CT surgeon sent in -he has not called his CT surgery office -no real panic attack, SOB, chest pain - is a mild discomfort in chest with activity or stress  ROS: See pertinent positives and negatives per HPI.  Past Medical History:  Diagnosis Date  . Chicken pox    hx  . Coronary artery disease   . Hyperlipemia 02/02/2013  . Hyperlipidemia   . Left shoulder pain 02/02/2013  . Microscopic hematuria 02/02/2013  . Vision loss of left eye     Past Surgical History:  Procedure Laterality Date  . COLONOSCOPY    . LEFT HEART CATH AND CORONARY ANGIOGRAPHY N/A 08/08/2018   Procedure: LEFT HEART CATH AND CORONARY ANGIOGRAPHY;  Surgeon: Lorretta Harp, MD;  Location: [facility];  Service: Cardiovascular;  Laterality: N/A;  . TONSILLECTOMY AND ADENOIDECTOMY  1959    Family History  Problem Relation Age of Onset  . Prostate cancer Father   . Colon cancer Neg Hx   . Esophageal cancer Neg Hx   . Pancreatic cancer Neg Hx   . Rectal cancer Neg Hx   . Stomach cancer Neg Hx   . Heart disease Neg Hx     SOCIAL HX: see hpi   Current Outpatient Medications:  .  acetaminophen (TYLENOL) 325 MG tablet, Take 650 mg by mouth at bedtime as needed (for sleep). , Disp: , Rfl:  .  aspirin 81 MG tablet, Take 81 mg by mouth daily., Disp: , Rfl:  .   carbamide peroxide (DEBROX) 6.5 % OTIC solution, Place 2 drops into both ears daily as needed (for swimmers ear)., Disp: , Rfl:  .  cetirizine (ZYRTEC) 10 MG tablet, Take 10 mg by mouth daily as needed for allergies., Disp: , Rfl:  .  diazepam (VALIUM) 10 MG tablet, Take the night before procedure and the morning before procedure, Disp: 2 tablet, Rfl: 0 .  diazepam (VALIUM) 5 MG tablet, Take 1 tablet (5 mg total) by mouth every 12 (twelve) hours as needed for anxiety., Disp: 10 tablet, Rfl: 0 .  ibuprofen (ADVIL,MOTRIN) 200 MG tablet, Take 400 mg by mouth at bedtime as needed (for sleep). , Disp: , Rfl:  .  nitroGLYCERIN (NITROSTAT) 0.4 MG SL tablet, Place 1 tablet (0.4 mg total) under the tongue every 5 (five) minutes as needed for chest pain., Disp: 25 tablet, Rfl: 3 .  rosuvastatin (CRESTOR) 5 MG tablet, Take 1 tablet (5 mg total) by mouth 2 (two) times a week., Disp: 24 tablet, Rfl: 3  EXAM:  Vitals:   08/17/18 1451  BP: 100/78  Pulse: 70  Temp: 97.8 F (36.6 C)    Body mass index is 28.85 kg/m.  GENERAL: vitals reviewed and listed above, alert, oriented, appears well hydrated and in no acute distress  HEENT: atraumatic, conjunttiva clear, no obvious  abnormalities on inspection of external nose and ears  NECK: no obvious masses on inspection  LUNGS: clear to auscultation bilaterally, no wheezes, rales or rhonchi, good air movement  CV: HRRR, no peripheral edema  MS: moves all extremities without noticeable abnormality  PSYCH: pleasant and cooperative, no obvious depression or anxiety  ASSESSMENT AND PLAN:  Discussed the following assessment and plan:  Stress  Chest discomfort  - I am surprised his cardiologist would refer him here for this, has has not symptoms currently - but he really is trying to sort out how much chest discomfort is normal in his situation and when he should use his ntg vs valium -talked with him and counseled over 40 minutes - I reviewed his  recent cath results and specialist notes and tried to explain these to him. He is stress about wether or not CABG is the only and best option for his CAD or if there may be other options.  -discussed risks/benefits of the ntg and valium and advised perhaps his CT surgeon's office would be better able to assist with his questions, he has a lot of questions about the CABG vs possible other options - his BP is on low end so advised he should discuss with his CT surgeon/cardiology team the best way to manage chest discomfort in the interim between now and his surgery - he agrees. He has no chest pain currently. -Patient advised to return or notify a doctor immediately if symptoms worsen or persist or new concerns arise.  There are no Patient Instructions on file for this visit.  Lucretia Kern, DO

## 2018-08-17 NOTE — Telephone Encounter (Signed)
New Message ° ° ° ° ° ° ° ° ° °Patient is returning your call °

## 2018-08-17 NOTE — Telephone Encounter (Signed)
Rescheduled patient to Monday at 1620.   While on the phone, he stated he may "need to be on stronger meds" because his chest "feels like it is constantly moving uphill." He feels like he is exerting himself, even at rest.  He adamantly denies CP and has not taken any NTG during one of these "episodes." When it was recommended to take, he stated he "may just need a blood thinner." Informed him Dr. Gwenlyn Found would be notified of symptoms. He states he would like to call Dr. Kennon Holter office instead.

## 2018-08-17 NOTE — Telephone Encounter (Signed)
Called to reschedule patient to Monday, August 26 at 1620 if he is available.  Left message to call back.

## 2018-08-17 NOTE — Telephone Encounter (Signed)
Spoke with pt, he is quite anxious about his current situation. He feels that he is hyper focused on his heart now. He thinks he may feel what he felt prior to the first cath. Explained to the patient he may have some of those symptoms until there is a descision made about the next step. He has NTG and knows how to use it. He feels like he may need something to help him relax. Referred patient to his medical doctor for any antianxiety medications. He is aware of the appt Monday and plans on keeping it. He will call back with any concerns.

## 2018-08-18 ENCOUNTER — Ambulatory Visit: Payer: Managed Care, Other (non HMO) | Admitting: Family Medicine

## 2018-08-21 ENCOUNTER — Encounter: Payer: Self-pay | Admitting: Cardiovascular Disease

## 2018-08-21 ENCOUNTER — Ambulatory Visit: Payer: Medicare Other | Admitting: Cardiology

## 2018-08-21 ENCOUNTER — Ambulatory Visit (INDEPENDENT_AMBULATORY_CARE_PROVIDER_SITE_OTHER): Payer: Medicare Other | Admitting: Cardiovascular Disease

## 2018-08-21 ENCOUNTER — Ambulatory Visit: Payer: Medicare Other | Admitting: Family Medicine

## 2018-08-21 VITALS — BP 108/74 | HR 73 | Ht 70.0 in | Wt 201.4 lb

## 2018-08-21 DIAGNOSIS — I2510 Atherosclerotic heart disease of native coronary artery without angina pectoris: Secondary | ICD-10-CM | POA: Diagnosis not present

## 2018-08-21 DIAGNOSIS — I25119 Atherosclerotic heart disease of native coronary artery with unspecified angina pectoris: Secondary | ICD-10-CM | POA: Diagnosis not present

## 2018-08-21 MED ORDER — DIAZEPAM 5 MG PO TABS: 5.0000 mg | ORAL_TABLET | Freq: Every day | ORAL | 0 refills | Status: DC | PRN

## 2018-08-21 MED ORDER — CLOPIDOGREL BISULFATE 75 MG PO TABS: 75.0000 mg | ORAL_TABLET | Freq: Every day | ORAL | 3 refills | Status: DC

## 2018-08-21 NOTE — H&P (View-Only) (Signed)
Cardiology Office Note Date:  08/21/2018   ID:  Ronnie Vazquez, DOB [birth year], MRN [id number]  PCP:  Lucretia Kern, DO  Cardiologist:  Sherren Mocha, MD    Chief Complaint  Patient presents with  . Chest Pain     History of Present Illness: Ronnie Vazquez is a 72 y.o. male who presents for evaluation of CAD.   He is here with his wife today. He underwent initial evaluation because of the presence of exertional angina occurring with walking up a hill. A Myoview stress test demonstrated high-risk features of anterior ischemia and cardiac catheterization was performed. This demonstrated severe proximal LAD/first diagonal stenosis and CABG was recommended. He underwent surgical evaluation by Dr. Prescott Gum who felt the patient was a good candidate for surgical revascularization.  However, the patient has decided he does not want to proceed with surgery if he could be treated with percutaneous coronary intervention. He had his cardiac cath films reviewed by Dr Tamala Julian at [facility] who felt PCI could be performed, albeit with a complex procedure.  The patient has not had any chest pain at rest.  He describes discomfort in the chest with moderate level activity.  He is been feeling increased anxiety around the decision regarding surgery versus PCI.  He is taking Valium as needed.  He took one nitroglycerin but it made him feel worse.  He denies shortness of breath, edema, or heart palpitations.  He otherwise feels well with no specific complaints.   Past Medical History:  Diagnosis Date  . Chicken pox    hx  . Coronary artery disease   . Hyperlipemia 02/02/2013  . Hyperlipidemia   . Left shoulder pain 02/02/2013  . Microscopic hematuria 02/02/2013  . Vision loss of left eye     Past Surgical History:  Procedure Laterality Date  . COLONOSCOPY    . LEFT HEART CATH AND CORONARY ANGIOGRAPHY N/A 08/08/2018   Procedure: LEFT HEART CATH AND CORONARY ANGIOGRAPHY;  Surgeon: Lorretta Harp, MD;  Location:  [facility];  Service: Cardiovascular;  Laterality: N/A;  . TONSILLECTOMY AND ADENOIDECTOMY  1959    Current Outpatient Medications  Medication Sig Dispense Refill  . acetaminophen (TYLENOL) 325 MG tablet Take 650 mg by mouth at bedtime as needed (for sleep).     Marland Kitchen aspirin 81 MG tablet Take 81 mg by mouth daily.    . cetirizine (ZYRTEC) 10 MG tablet Take 10 mg by mouth daily as needed for allergies.    . diazepam (VALIUM) 5 MG tablet Take 1 tablet (5 mg total) by mouth daily as needed for anxiety. 20 tablet 0  . nitroGLYCERIN (NITROSTAT) 0.4 MG SL tablet Place 1 tablet (0.4 mg total) under the tongue every 5 (five) minutes as needed for chest pain. 25 tablet 3  . rosuvastatin (CRESTOR) 5 MG tablet Take 1 tablet (5 mg total) by mouth 2 (two) times a week. 24 tablet 3  . clopidogrel (PLAVIX) 75 MG tablet Take 1 tablet (75 mg total) by mouth daily. 90 tablet 3   No current facility-administered medications for this visit.     Allergies:   Patient has no known allergies.   Social History:  The patient  reports that he has never smoked. He has never used smokeless tobacco. He reports that he drinks about 2.0 standard drinks of alcohol per week. He reports that he does not use drugs.   Family History:  The patient's family history includes Prostate cancer in his  father.    ROS:  Please see the history of present illness.  Otherwise, review of systems is positive for anxiety.  All other systems are reviewed and negative.    PHYSICAL EXAM: VS:  BP 108/74   Pulse 73   Ht 5\' 10"  (1.778 m)   Wt 201 lb 6.4 oz (91.4 kg)   SpO2 96%   BMI 28.90 kg/m  , BMI Body mass index is 28.9 kg/m. GEN: Well nourished, well developed, in no acute distress  HEENT: normal  Neck: no JVD, no masses. No carotid bruits Cardiac: RRR without murmur or gallop                Respiratory:  clear to auscultation bilaterally, normal work of breathing GI: soft, nontender, nondistended, + BS MS: no deformity  or atrophy  Ext: no pretibial edema, pedal pulses 2+= bilaterally Skin: warm and dry, no rash Neuro:  Strength and sensation are intact Psych: euthymic mood, full affect  EKG:  EKG is not ordered today.  Recent Labs: 08/10/2018: ALT 31; BUN 21; Creatinine, Ser 1.33; Hemoglobin 14.6; Platelets 212; Potassium 4.2; Sodium 138   Lipid Panel     Component Value Date/Time   CHOL 224 (H) 08/12/2017 0921   TRIG 301.0 (H) 08/12/2017 0921   HDL 42.00 08/12/2017 0921   CHOLHDL 5 08/12/2017 0921   VLDL 60.2 (H) 08/12/2017 0921   LDLDIRECT 117.0 08/12/2017 0921      Wt Readings from Last 3 Encounters:  08/21/18 201 lb 6.4 oz (91.4 kg)  08/17/18 201 lb 1.6 oz (91.2 kg)  08/15/18 202 lb 3.2 oz (91.7 kg)     Cardiac Studies Reviewed: Myocardial Perfusion Study: Study Highlights    The left ventricular ejection fraction is mildly decreased (45-54%).  Nuclear stress EF: 54%.  ST segment depression was noted during stress in the V5, V6, II, III, aVF and I leads.  Blood pressure demonstrated a hypotensive response to exercise.  Defect 1: There is a large defect of severe severity present in the mid anterior, mid anteroseptal, apical anterior, apical septal and apex location.  Findings consistent with ischemia.  This is a high risk study.   Conclusion     Prox LAD-1 lesion is 90% stenosed.  Prox LAD-2 lesion is 70% stenosed.  The left ventricular systolic function is normal.  LV end diastolic pressure is normal.  The left ventricular ejection fraction is 55-65% by visual estimate.    OTHA MONICAL is a 72 y.o. male    [id number] LOCATION:  FACILITY: [facility]  PHYSICIAN: Quay Burow, M.D. January 23, 1946   DATE OF PROCEDURE:  08/08/2018  DATE OF DISCHARGE:     CARDIAC CATHETERIZATION     History obtained from chart review.Ronnie Vazquez a 55 [employer] overweight married Caucasian male father of 2, grandfather of 5 grandchildren referred by Dr. Maudie Mercury for  cardiovascular evaluation because of new onset exertional chest pain. 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. His risk factors include untreated hyperlipidemia because of statin intolerance. There is no family history. He is never had a heart attack or stroke. He noticed new onset exertional chest tightness 6 to 8 weeks ago when walking up an incline or on the treadmill. This resolves with stopping the activity. It does not radiate.  A Myoview stress test was performed that showed ischemia in the LAD territory.  Based on this, it was elected to proceed with outpatient diagnostic coronary angiography.   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  This is a nondominant vessel and at most 2 to 20 mm in diameter.  It does not give off marginal branches.  His RCA is large dominant and free of disease.  His LAD has a 90% stenosis at the takeoff of a large first diagonal branch which is as big as the LAD.  There is a 77 to 75% stenosis just beyond this.  The issue has whether to stent across the diagonal branch with a long stent and provisionally intervene on the ostium of it of the diagonal branch is compromised, have an upfront to stent strategy or suggest a LIMA to the LAD and diagonal branch which would survive long-term patency of antiplatelet medications.  He does have normal LV function.  His angina is completely exertional.  I will review the options with my colleagues before making a final decision regarding street treatment strategy.  The sheath was removed and a TR band was placed on the right wrist to achieve patent hemostasis.  The patient left the lab in stable condition.  Quay Burow. MD, [facility] 08/08/2018 8:33 AM       Indications   Abnormal nuclear stress test [R94.39 (ICD-10-CM)]  Exertional chest pain [R07.9 (ICD-10-CM)]  Procedural Details/Technique   Technical Details PROCEDURE DESCRIPTION:   The  patient was brought to the second floor [facility] cath lab in the postabsorptive state. He was premedicated with IV Versed and fentanyl. His right wristwas prepped and shaved in usual sterile fashion. Xylocaine 1% was used for local anesthesia. A 6 French sheath was inserted into the right radial artery using standard Seldinger technique. The patient received 4500 units of heparin intravenously. A 5 French left Judkins diagnostic catheters and pigtail catheters were used for selective coronary angiography and left ventriculography respectively. Isovue dye was used for the entirety of the case. Retrograde aorta, left ventricular and pullback pressures were recorded. Radial cocktail was administered via the SideArm sheath.   Estimated blood loss <50 mL.  During this procedure the patient was administered the following to achieve and maintain moderate conscious sedation: Versed 1 mg, Fentanyl 25 mcg, while the patient's heart rate, blood pressure, and oxygen saturation were continuously monitored. The period of conscious sedation was 49 minutes, of which I was present face-to-face 100% of this time.  Coronary Findings   Diagnostic  Dominance: Right  Left Anterior Descending  Prox LAD-1 lesion 90% stenosed  Prox LAD-1 lesion is 90% stenosed.  Prox LAD-2 lesion 70% stenosed  Prox LAD-2 lesion is 70% stenosed.  Intervention   No interventions have been documented.  Wall Motion   Resting               Left Heart   Left Ventricle The left ventricular size is normal. The left ventricular systolic function is normal. LV end diastolic pressure is normal. The left ventricular ejection fraction is 55-65% by visual estimate. No regional wall motion abnormalities.  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  The  patient's coronary angiogram is personally reviewed.  The notes of Dr. Gwenlyn Found and Dr. Prescott Gum are reviewed.  I discussed the pros and cons of PCI versus coronary bypass surgery today at length with the patient.  He understands that PCI offers quicker recovery and  less upfront morbidity as long as the procedure is uncomplicated.  He also understands that surgical revascularization is associated with greater durability in the lower risk of repeat revascularization.  After our discussion today, he prefers to proceed with PCI.  He will be started on clopidogrel and scheduled for angioplasty and stenting of the LAD/first diagonal.  It is possible that he will require complex bifurcation stenting. I have reviewed the risks, indications, and alternatives to cardiac catheterization, possible angioplasty, and stenting with the patient. Risks include but are not limited to bleeding, infection, vascular injury, stroke, myocardial infection, arrhythmia, kidney injury, radiation-related injury in the case of prolonged fluoroscopy use, emergency cardiac surgery, and death. The patient understands the risks of serious complication is 1-2 in 2025 with diagnostic cardiac cath and 1-2% or less with angioplasty/stenting. Considering his more complex anatomy, his risk of peri-procedural MI is higher than an average PCI procedure.   Current medicines are reviewed with the patient today.  The patient does not have concerns regarding medicines.  Labs/ tests ordered today include:  No orders of the defined types were placed in this encounter.   Disposition:   As above  Signed, Sherren Mocha, MD  08/21/2018 5:45 PM    [facility] [address], [place], [place]  [address] Phone: [phone]; Fax: [phone]

## 2018-08-21 NOTE — Progress Notes (Signed)
Cardiology Office Note Date:  08/21/2018   ID:  Ronnie Vazquez, DOB 12-17-1946, MRN [id number]  PCP:  Lucretia Kern, DO  Cardiologist:  Sherren Mocha, MD    Chief Complaint  Patient presents with  . Chest Pain     History of Present Illness: Ronnie Vazquez is a 72 y.o. male who presents for evaluation of CAD.   He is here with his wife today. He underwent initial evaluation because of the presence of exertional angina occurring with walking up a hill. A Myoview stress test demonstrated high-risk features of anterior ischemia and cardiac catheterization was performed. This demonstrated severe proximal LAD/first diagonal stenosis and CABG was recommended. He underwent surgical evaluation by Dr. Prescott Gum who felt the patient was a good candidate for surgical revascularization.  However, the patient has decided he does not want to proceed with surgery if he could be treated with percutaneous coronary intervention. He had his cardiac cath films reviewed by Dr Tamala Julian at [facility] who felt PCI could be performed, albeit with a complex procedure.  The patient has not had any chest pain at rest.  He describes discomfort in the chest with moderate level activity.  He is been feeling increased anxiety around the decision regarding surgery versus PCI.  He is taking Valium as needed.  He took one nitroglycerin but it made him feel worse.  He denies shortness of breath, edema, or heart palpitations.  He otherwise feels well with no specific complaints.   Past Medical History:  Diagnosis Date  . Chicken pox    hx  . Coronary artery disease   . Hyperlipemia 02/02/2013  . Hyperlipidemia   . Left shoulder pain 02/02/2013  . Microscopic hematuria 02/02/2013  . Vision loss of left eye     Past Surgical History:  Procedure Laterality Date  . COLONOSCOPY    . LEFT HEART CATH AND CORONARY ANGIOGRAPHY N/A 08/08/2018   Procedure: LEFT HEART CATH AND CORONARY ANGIOGRAPHY;  Surgeon: Lorretta Harp, MD;  Location:  [facility];  Service: Cardiovascular;  Laterality: N/A;  . TONSILLECTOMY AND ADENOIDECTOMY  1959    Current Outpatient Medications  Medication Sig Dispense Refill  . acetaminophen (TYLENOL) 325 MG tablet Take 650 mg by mouth at bedtime as needed (for sleep).     Marland Kitchen aspirin 81 MG tablet Take 81 mg by mouth daily.    . cetirizine (ZYRTEC) 10 MG tablet Take 10 mg by mouth daily as needed for allergies.    . diazepam (VALIUM) 5 MG tablet Take 1 tablet (5 mg total) by mouth daily as needed for anxiety. 20 tablet 0  . nitroGLYCERIN (NITROSTAT) 0.4 MG SL tablet Place 1 tablet (0.4 mg total) under the tongue every 5 (five) minutes as needed for chest pain. 25 tablet 3  . rosuvastatin (CRESTOR) 5 MG tablet Take 1 tablet (5 mg total) by mouth 2 (two) times a week. 24 tablet 3  . clopidogrel (PLAVIX) 75 MG tablet Take 1 tablet (75 mg total) by mouth daily. 90 tablet 3   No current facility-administered medications for this visit.     Allergies:   Patient has no known allergies.   Social History:  The patient  reports that he has never smoked. He has never used smokeless tobacco. He reports that he drinks about 2.0 standard drinks of alcohol per week. He reports that he does not use drugs.   Family History:  The patient's family history includes Prostate cancer in his  father.    ROS:  Please see the history of present illness.  Otherwise, review of systems is positive for anxiety.  All other systems are reviewed and negative.    PHYSICAL EXAM: VS:  BP 108/74   Pulse 73   Ht 5\' 10"  (1.778 m)   Wt 201 lb 6.4 oz (91.4 kg)   SpO2 96%   BMI 28.90 kg/m  , BMI Body mass index is 28.9 kg/m. GEN: Well nourished, well developed, in no acute distress  HEENT: normal  Neck: no JVD, no masses. No carotid bruits Cardiac: RRR without murmur or gallop                Respiratory:  clear to auscultation bilaterally, normal work of breathing GI: soft, nontender, nondistended, + BS MS: no deformity  or atrophy  Ext: no pretibial edema, pedal pulses 2+= bilaterally Skin: warm and dry, no rash Neuro:  Strength and sensation are intact Psych: euthymic mood, full affect  EKG:  EKG is not ordered today.  Recent Labs: 08/10/2018: ALT 31; BUN 21; Creatinine, Ser 1.33; Hemoglobin 14.6; Platelets 212; Potassium 4.2; Sodium 138   Lipid Panel     Component Value Date/Time   CHOL 224 (H) 08/12/2017 0921   TRIG 301.0 (H) 08/12/2017 0921   HDL 42.00 08/12/2017 0921   CHOLHDL 5 08/12/2017 0921   VLDL 60.2 (H) 08/12/2017 0921   LDLDIRECT 117.0 08/12/2017 0921      Wt Readings from Last 3 Encounters:  08/21/18 201 lb 6.4 oz (91.4 kg)  08/17/18 201 lb 1.6 oz (91.2 kg)  08/15/18 202 lb 3.2 oz (91.7 kg)     Cardiac Studies Reviewed: Myocardial Perfusion Study: Study Highlights    The left ventricular ejection fraction is mildly decreased (45-54%).  Nuclear stress EF: 54%.  ST segment depression was noted during stress in the V5, V6, II, III, aVF and I leads.  Blood pressure demonstrated a hypotensive response to exercise.  Defect 1: There is a large defect of severe severity present in the mid anterior, mid anteroseptal, apical anterior, apical septal and apex location.  Findings consistent with ischemia.  This is a high risk study.   Conclusion     Prox LAD-1 lesion is 90% stenosed.  Prox LAD-2 lesion is 70% stenosed.  The left ventricular systolic function is normal.  LV end diastolic pressure is normal.  The left ventricular ejection fraction is 55-65% by visual estimate.    Ronnie Vazquez is a 72 y.o. male    [id number] LOCATION:  FACILITY: [facility]  PHYSICIAN: Quay Burow, M.D. 1946/11/11   DATE OF PROCEDURE:  08/08/2018  DATE OF DISCHARGE:     CARDIAC CATHETERIZATION     History obtained from chart review.Ronnie Vazquez a 51 [employer] overweight married Caucasian male father of 2, grandfather of 5 grandchildren referred by Dr. Maudie Mercury for  cardiovascular evaluation because of new onset exertional chest pain. 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. His risk factors include untreated hyperlipidemia because of statin intolerance. There is no family history. He is never had a heart attack or stroke. He noticed new onset exertional chest tightness 6 to 8 weeks ago when walking up an incline or on the treadmill. This resolves with stopping the activity. It does not radiate.  A Myoview stress test was performed that showed ischemia in the LAD territory.  Based on this, it was elected to proceed with outpatient diagnostic coronary angiography.   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  This is a nondominant vessel and at most 2 to 20 mm in diameter.  It does not give off marginal branches.  His RCA is large dominant and free of disease.  His LAD has a 90% stenosis at the takeoff of a large first diagonal branch which is as big as the LAD.  There is a 77 to 75% stenosis just beyond this.  The issue has whether to stent across the diagonal branch with a long stent and provisionally intervene on the ostium of it of the diagonal branch is compromised, have an upfront to stent strategy or suggest a LIMA to the LAD and diagonal branch which would survive long-term patency of antiplatelet medications.  He does have normal LV function.  His angina is completely exertional.  I will review the options with my colleagues before making a final decision regarding street treatment strategy.  The sheath was removed and a TR band was placed on the right wrist to achieve patent hemostasis.  The patient left the lab in stable condition.  Quay Burow. MD, [facility] 08/08/2018 8:33 AM       Indications   Abnormal nuclear stress test [R94.39 (ICD-10-CM)]  Exertional chest pain [R07.9 (ICD-10-CM)]  Procedural Details/Technique   Technical Details PROCEDURE DESCRIPTION:   The  patient was brought to the second floor [facility] lab in the postabsorptive state. He was premedicated with IV Versed and fentanyl. His right wristwas prepped and shaved in usual sterile fashion. Xylocaine 1% was used for local anesthesia. A 6 French sheath was inserted into the right radial artery using standard Seldinger technique. The patient received 4500 units of heparin intravenously. A 5 French left Judkins diagnostic catheters and pigtail catheters were used for selective coronary angiography and left ventriculography respectively. Isovue dye was used for the entirety of the case. Retrograde aorta, left ventricular and pullback pressures were recorded. Radial cocktail was administered via the SideArm sheath.   Estimated blood loss <50 mL.  During this procedure the patient was administered the following to achieve and maintain moderate conscious sedation: Versed 1 mg, Fentanyl 25 mcg, while the patient's heart rate, blood pressure, and oxygen saturation were continuously monitored. The period of conscious sedation was 49 minutes, of which I was present face-to-face 100% of this time.  Coronary Findings   Diagnostic  Dominance: Right  Left Anterior Descending  Prox LAD-1 lesion 90% stenosed  Prox LAD-1 lesion is 90% stenosed.  Prox LAD-2 lesion 70% stenosed  Prox LAD-2 lesion is 70% stenosed.  Intervention   No interventions have been documented.  Wall Motion   Resting               Left Heart   Left Ventricle The left ventricular size is normal. The left ventricular systolic function is normal. LV end diastolic pressure is normal. The left ventricular ejection fraction is 55-65% by visual estimate. No regional wall motion abnormalities.  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  The  patient's coronary angiogram is personally reviewed.  The notes of Dr. Gwenlyn Found and Dr. Prescott Gum are reviewed.  I discussed the pros and cons of PCI versus coronary bypass surgery today at length with the patient.  He understands that PCI offers quicker recovery and  less upfront morbidity as long as the procedure is uncomplicated.  He also understands that surgical revascularization is associated with greater durability in the lower risk of repeat revascularization.  After our discussion today, he prefers to proceed with PCI.  He will be started on clopidogrel and scheduled for angioplasty and stenting of the LAD/first diagonal.  It is possible that he will require complex bifurcation stenting. I have reviewed the risks, indications, and alternatives to cardiac catheterization, possible angioplasty, and stenting with the patient. Risks include but are not limited to bleeding, infection, vascular injury, stroke, myocardial infection, arrhythmia, kidney injury, radiation-related injury in the case of prolonged fluoroscopy use, emergency cardiac surgery, and death. The patient understands the risks of serious complication is 1-2 in 4920 with diagnostic cardiac cath and 1-2% or less with angioplasty/stenting. Considering his more complex anatomy, his risk of peri-procedural MI is higher than an average PCI procedure.   Current medicines are reviewed with the patient today.  The patient does not have concerns regarding medicines.  Labs/ tests ordered today include:  No orders of the defined types were placed in this encounter.   Disposition:   As above  Signed, Sherren Mocha, MD  08/21/2018 5:45 PM    [facility] [place], [place], [place]  [address] Phone: [phone]; Fax: [phone]

## 2018-08-21 NOTE — Patient Instructions (Signed)
Medication Instructions:  1) START PLAVIX 75 mg daily  Labwork: None needed   Testing/Procedures: Your physician has requested that you have a cardiac catheterization. Cardiac catheterization is used to diagnose and/or treat various heart conditions. Doctors may recommend this procedure for a number of different reasons. The most common reason is to evaluate chest pain. Chest pain can be a symptom of coronary artery disease (CAD), and cardiac catheterization can show whether plaque is narrowing or blocking your heart's arteries. This procedure is also used to evaluate the valves, as well as measure the blood flow and oxygen levels in different parts of your heart. For further information please visit [email or web address]. Please follow instruction sheet, as given.  Follow-Up: Your provider recommends that you schedule a follow-up appointment AS NEEDED pending study results.  Any Other Special Instructions Will Be Listed Below (If Applicable).    [facility] [place], [place] [address] Dept: [phone] Loc: [phone]  Ronnie Vazquez  08/21/2018  You are scheduled for a Cardiac Catheterization on Tuesday, September 3 with Dr. Sherren Mocha.  1. Please arrive at the [facility] (Main Entrance A) at [facility]: [address] [place], [place] [address] at 5:30 AM (This time is two hours before your procedure to ensure your preparation). Free valet parking service is available.   Special note: Every effort is made to have your procedure done on time. Please understand that emergencies sometimes delay scheduled procedures.  2. Diet: Do not eat solid foods after midnight.  The patient may have clear liquids until 5am upon the day of the procedure.  3. Labs: None needed (drawn 8/19)  4. Medication instructions in preparation for your procedure:  1) Make sure to take ASPIRIN  and PLAVIX the morning of your catheterization.  2) you may take your other medications as directed with sips of water.  5. Plan for one night stay--bring personal belongings. 6. Bring a current list of your medications and current insurance cards. 7. You MUST have a responsible person to drive you home. 8. Someone MUST be with you the first 24 hours after you arrive home or your discharge will be delayed. 9. Please wear clothes that are easy to get on and off and wear slip-on shoes.  Thank you for allowing [place] to care for you!   -- [facility]

## 2018-08-22 ENCOUNTER — Other Ambulatory Visit: Payer: Self-pay | Admitting: *Deleted

## 2018-08-24 ENCOUNTER — Institutional Professional Consult (permissible substitution): Payer: Medicare Other | Admitting: Cardiovascular Disease

## 2018-08-24 ENCOUNTER — Telehealth: Payer: Self-pay | Admitting: *Deleted

## 2018-08-24 NOTE — Telephone Encounter (Signed)
Pt contacted pre-coronary stent intervention scheduled at [facility] for: Tuesday August 29, 2018 7:30 AM Verified arrival time and place: [facility] Entrance A at: 5:30 AM  No solid food after midnight prior to cath, clear liquids until 5 AM day of procedure. Verified no known  allergies Verified no diabetes medications.  AM meds can be  taken pre-cath with sip of water including: ASA 81 mg Clopidogrel 75 mg  Confirmed patient has responsible person to drive home post procedure and for 24 hours after you arrive home: yes

## 2018-08-25 ENCOUNTER — Inpatient Hospital Stay (HOSPITAL_COMMUNITY): Admission: RE | Admit: 2018-08-25 | Payer: Medicare Other | Source: Ambulatory Visit | Admitting: Cardiothoracic Surgery

## 2018-08-25 ENCOUNTER — Encounter (HOSPITAL_COMMUNITY): Admission: RE | Payer: Self-pay | Source: Ambulatory Visit

## 2018-08-25 SURGERY — CORONARY ARTERY BYPASS GRAFTING (CABG)
Anesthesia: General | Site: Chest

## 2018-08-29 ENCOUNTER — Encounter (HOSPITAL_COMMUNITY): Payer: Self-pay | Admitting: Cardiology

## 2018-08-29 ENCOUNTER — Ambulatory Visit (HOSPITAL_COMMUNITY)
Admission: RE | Admit: 2018-08-29 | Discharge: 2018-08-29 | Disposition: A | Discharge status: Home / Self Care | Payer: Medicare Other | Source: Ambulatory Visit | Attending: Cardiovascular Disease | Admitting: Cardiovascular Disease

## 2018-08-29 ENCOUNTER — Encounter (HOSPITAL_COMMUNITY)
Admission: RE | Disposition: A | Discharge status: Home / Self Care | Payer: Self-pay | Source: Ambulatory Visit | Attending: Cardiovascular Disease

## 2018-08-29 ENCOUNTER — Other Ambulatory Visit: Payer: Self-pay

## 2018-08-29 DIAGNOSIS — Z7982 Long term (current) use of aspirin: Secondary | ICD-10-CM | POA: Diagnosis not present

## 2018-08-29 DIAGNOSIS — F419 Anxiety disorder, unspecified: Secondary | ICD-10-CM | POA: Diagnosis not present

## 2018-08-29 DIAGNOSIS — Z955 Presence of coronary angioplasty implant and graft: Secondary | ICD-10-CM

## 2018-08-29 DIAGNOSIS — E785 Hyperlipidemia, unspecified: Secondary | ICD-10-CM | POA: Insufficient documentation

## 2018-08-29 DIAGNOSIS — I25118 Atherosclerotic heart disease of native coronary artery with other forms of angina pectoris: Secondary | ICD-10-CM | POA: Diagnosis not present

## 2018-08-29 DIAGNOSIS — Z7902 Long term (current) use of antithrombotics/antiplatelets: Secondary | ICD-10-CM | POA: Insufficient documentation

## 2018-08-29 HISTORY — PX: CORONARY STENT INTERVENTION: CATH118234

## 2018-08-29 HISTORY — PX: CORONARY BALLOON ANGIOPLASTY: CATH118233

## 2018-08-29 LAB — POCT ACTIVATED CLOTTING TIME
ACTIVATED CLOTTING TIME: 406 s
Activated Clotting Time: 268 seconds
Activated Clotting Time: 307 seconds

## 2018-08-29 SURGERY — CORONARY STENT INTERVENTION
Anesthesia: LOCAL

## 2018-08-29 MED ORDER — SODIUM CHLORIDE 0.9% FLUSH: 3.0000 mL | Freq: Two times a day (BID) | INTRAVENOUS | Status: DC

## 2018-08-29 MED ORDER — VERAPAMIL HCL 2.5 MG/ML IV SOLN
INTRAVENOUS | Status: AC
  Filled 2018-08-29: qty 2

## 2018-08-29 MED ORDER — HYDRALAZINE HCL 20 MG/ML IJ SOLN: 5.0000 mg | INTRAMUSCULAR | Status: DC | PRN

## 2018-08-29 MED ORDER — LABETALOL HCL 5 MG/ML IV SOLN: 10.0000 mg | INTRAVENOUS | Status: DC | PRN

## 2018-08-29 MED ORDER — ANGIOPLASTY BOOK
Freq: Once | Status: DC
  Filled 2018-08-29: qty 1

## 2018-08-29 MED ORDER — SODIUM CHLORIDE 0.9% FLUSH: 3.0000 mL | INTRAVENOUS | Status: DC | PRN

## 2018-08-29 MED ORDER — MIDAZOLAM HCL 2 MG/2ML IJ SOLN
INTRAMUSCULAR | Status: AC
  Filled 2018-08-29: qty 2

## 2018-08-29 MED ORDER — ASPIRIN 81 MG PO CHEW: 81.0000 mg | CHEWABLE_TABLET | ORAL | Status: DC

## 2018-08-29 MED ORDER — SODIUM CHLORIDE 0.9 % WEIGHT BASED INFUSION
3.0000 mL/kg/h | INTRAVENOUS | Status: AC
  Administered 2018-08-29: 3 mL/kg/h via INTRAVENOUS

## 2018-08-29 MED ORDER — NITROGLYCERIN 1 MG/10 ML FOR IR/CATH LAB
INTRA_ARTERIAL | Status: AC
  Filled 2018-08-29: qty 10

## 2018-08-29 MED ORDER — HEPARIN SODIUM (PORCINE) 1000 UNIT/ML IJ SOLN
INTRAMUSCULAR | Status: AC
  Filled 2018-08-29: qty 1

## 2018-08-29 MED ORDER — IOHEXOL 350 MG/ML SOLN
INTRAVENOUS | Status: DC | PRN
  Administered 2018-08-29: 100 mL via INTRA_ARTERIAL

## 2018-08-29 MED ORDER — FENTANYL CITRATE (PF) 100 MCG/2ML IJ SOLN
INTRAMUSCULAR | Status: DC | PRN
  Administered 2018-08-29 (×2): 25 ug via INTRAVENOUS

## 2018-08-29 MED ORDER — SODIUM CHLORIDE 0.9 % WEIGHT BASED INFUSION: 1.0000 mL/kg/h | INTRAVENOUS | Status: DC

## 2018-08-29 MED ORDER — MIDAZOLAM HCL 2 MG/2ML IJ SOLN
INTRAMUSCULAR | Status: DC | PRN
  Administered 2018-08-29 (×2): 2 mg via INTRAVENOUS

## 2018-08-29 MED ORDER — HEPARIN (PORCINE) IN NACL 1000-0.9 UT/500ML-% IV SOLN
INTRAVENOUS | Status: DC | PRN
  Administered 2018-08-29 (×2): 500 mL

## 2018-08-29 MED ORDER — VERAPAMIL HCL 2.5 MG/ML IV SOLN
INTRAVENOUS | Status: DC | PRN
  Administered 2018-08-29: 10 mL via INTRA_ARTERIAL

## 2018-08-29 MED ORDER — NITROGLYCERIN 1 MG/10 ML FOR IR/CATH LAB
INTRA_ARTERIAL | Status: DC | PRN
  Administered 2018-08-29: 150 ug via INTRACORONARY
  Administered 2018-08-29: 200 ug via INTRACORONARY

## 2018-08-29 MED ORDER — LIDOCAINE HCL (PF) 1 % IJ SOLN
INTRAMUSCULAR | Status: AC
  Filled 2018-08-29: qty 30

## 2018-08-29 MED ORDER — HEPARIN (PORCINE) IN NACL 1000-0.9 UT/500ML-% IV SOLN
INTRAVENOUS | Status: AC
  Filled 2018-08-29: qty 1000

## 2018-08-29 MED ORDER — LIDOCAINE HCL (PF) 1 % IJ SOLN
INTRAMUSCULAR | Status: DC | PRN
  Administered 2018-08-29: 2 mL

## 2018-08-29 MED ORDER — HEPARIN SODIUM (PORCINE) 1000 UNIT/ML IJ SOLN
INTRAMUSCULAR | Status: DC | PRN
  Administered 2018-08-29: 4000 [IU] via INTRAVENOUS
  Administered 2018-08-29: 9000 [IU] via INTRAVENOUS

## 2018-08-29 MED ORDER — SODIUM CHLORIDE 0.9 % IV SOLN: 250.0000 mL | INTRAVENOUS | Status: DC | PRN

## 2018-08-29 MED ORDER — SODIUM CHLORIDE 0.9 % IV SOLN
INTRAVENOUS | Status: AC | PRN
  Administered 2018-08-29: 200 mL via INTRAVENOUS

## 2018-08-29 MED ORDER — CLOPIDOGREL BISULFATE 75 MG PO TABS: 75.0000 mg | ORAL_TABLET | ORAL | Status: DC

## 2018-08-29 MED ORDER — ACETAMINOPHEN 325 MG PO TABS: 650.0000 mg | ORAL_TABLET | ORAL | Status: DC | PRN

## 2018-08-29 MED ORDER — FENTANYL CITRATE (PF) 100 MCG/2ML IJ SOLN
INTRAMUSCULAR | Status: AC
  Filled 2018-08-29: qty 2

## 2018-08-29 MED ORDER — ONDANSETRON HCL 4 MG/2ML IJ SOLN: 4.0000 mg | Freq: Four times a day (QID) | INTRAMUSCULAR | Status: DC | PRN

## 2018-08-29 SURGICAL SUPPLY — 22 items
BALLN SAPPHIRE 2.5X12 (BALLOONS) ×2
BALLN SAPPHIRE 2.5X15 (BALLOONS) ×2
BALLN SAPPHIRE 3.0X10 (BALLOONS) ×2
BALLN SAPPHIRE ~~LOC~~ 2.5X12 (BALLOONS) ×2 IMPLANT
BALLN SAPPHIRE ~~LOC~~ 3.25X18 (BALLOONS) ×2 IMPLANT
BALLOON SAPPHIRE 2.5X12 (BALLOONS) ×1 IMPLANT
BALLOON SAPPHIRE 2.5X15 (BALLOONS) ×1 IMPLANT
BALLOON SAPPHIRE 3.0X10 (BALLOONS) ×1 IMPLANT
CATH VISTA GUIDE 6FR XBLAD3.5 (CATHETERS) ×2 IMPLANT
DEVICE RAD COMP TR BAND LRG (VASCULAR PRODUCTS) ×2 IMPLANT
ELECT DEFIB PAD ADLT CADENCE (PAD) ×2 IMPLANT
GLIDESHEATH SLEND SS 6F .021 (SHEATH) ×2 IMPLANT
GUIDEWIRE INQWIRE 1.5J.035X260 (WIRE) ×1 IMPLANT
INQWIRE 1.5J .035X260CM (WIRE) ×2
KIT ENCORE 26 ADVANTAGE (KITS) ×2 IMPLANT
KIT HEART LEFT (KITS) ×2 IMPLANT
KIT HEMO VALVE WATCHDOG (MISCELLANEOUS) ×2 IMPLANT
PACK CARDIAC CATHETERIZATION (CUSTOM PROCEDURE TRAY) ×2 IMPLANT
STENT RESOLUTE ONYX 3.0X30 (Permanent Stent) ×2 IMPLANT
TRANSDUCER W/STOPCOCK (MISCELLANEOUS) ×2 IMPLANT
TUBING CIL FLEX 10 FLL-RA (TUBING) ×2 IMPLANT
WIRE COUGAR XT STRL 190CM (WIRE) ×8 IMPLANT

## 2018-08-29 NOTE — Discharge Instructions (Signed)
Radial Site Care Refer to this sheet in the next few weeks. These instructions provide you with information about caring for yourself after your procedure. Your health care provider may also give you more specific instructions. Your treatment has been planned according to current medical practices, but problems sometimes occur. Call your health care provider if you have any problems or questions after your procedure. What can I expect after the procedure? After your procedure, it is typical to have the following:  Bruising at the radial site that usually fades within 1-2 weeks.  Blood collecting in the tissue (hematoma) that may be painful to the touch. It should usually decrease in size and tenderness within 1-2 weeks.  Follow these instructions at home:  Take medicines only as directed by your health care provider.  You may shower 24-48 hours after the procedure or as directed by your health care provider. Remove the bandage (dressing) and gently wash the site with plain soap and water. Pat the area dry with a clean towel. Do not rub the site, because this may cause bleeding.  Do not take baths, swim, or use a hot tub until your health care provider approves.  Check your insertion site every day for redness, swelling, or drainage.  Do not apply powder or lotion to the site.  Do not flex or bend the affected arm for 24 hours or as directed by your health care provider.  Do not push or pull heavy objects with the affected arm for 24 hours or as directed by your health care provider.  Do not lift over 10 lb (4.5 kg) for 5 days after your procedure or as directed by your health care provider.  Ask your health care provider when it is okay to: ? Return to work or school. ? Resume usual physical activities or sports. ? Resume sexual activity.  Do not drive home if you are discharged the same day as the procedure. Have someone else drive you.  You may drive 24 hours after the procedure  unless otherwise instructed by your health care provider.  Do not operate machinery or power tools for 24 hours after the procedure.  If your procedure was done as an outpatient procedure, which means that you went home the same day as your procedure, a responsible adult should be with you for the first 24 hours after you arrive home.  Keep all follow-up visits as directed by your health care provider. This is important. Contact a health care provider if:  You have a fever.  You have chills.  You have increased bleeding from the radial site. Hold pressure on the site. Get help right away if:  You have unusual pain at the radial site.  You have redness, warmth, or swelling at the radial site.  You have drainage (other than a small amount of blood on the dressing) from the radial site.  The radial site is bleeding, and the bleeding does not stop after 30 minutes of holding steady pressure on the site.  Your arm or hand becomes pale, cool, tingly, or numb. This information is not intended to replace advice given to you by your health care provider. Make sure you discuss any questions you have with your health care provider. Document Released: 01/15/2011 Document Revised: 05/20/2016 Document Reviewed: 07/01/2014 Elsevier Interactive Patient Education  2018 [employer].     Coronary Angiogram With Stent, Care After This sheet gives you information about how to care for yourself after your procedure. Your health  care provider may also give you more specific instructions. If you have problems or questions, contact your health care provider. What can I expect after the procedure? After your procedure, it is common to have:  Bruising in the area where a small, thin tube (catheter) was inserted. This usually fades within 1-2 weeks.  Blood collecting in the tissue (hematoma) that may be painful to the touch. It should usually decrease in size and tenderness within 1-2 weeks.  Follow  these instructions at home: Insertion area care  Do not take baths, swim, or use a hot tub until your health care provider approves.  You may shower 24-48 hours after the procedure or as directed by your health care provider.  Follow instructions from your health care provider about how to take care of your incision. Make sure you: ? Wash your hands with soap and water before you change your bandage (dressing). If soap and water are not available, use hand sanitizer. ? Change your dressing as told by your health care provider. ? Leave stitches (sutures), skin glue, or adhesive strips in place. These skin closures may need to stay in place for 2 weeks or longer. If adhesive strip edges start to loosen and curl up, you may trim the loose edges. Do not remove adhesive strips completely unless your health care provider tells you to do that.  Remove the bandage (dressing) and gently wash the catheter insertion site with plain soap and water.  Pat the area dry with a clean towel. Do not rub the area, because that may cause bleeding.  Do not apply powder or lotion to the incision area.  Check your incision area every day for signs of infection. Check for: ? More redness, swelling, or pain. ? More fluid or blood. ? Warmth. ? Pus or a bad smell. Activity  Do not drive for 24 hours if you were given a medicine to help you relax (sedative).  Do not lift anything that is heavier than 10 lb (4.5 kg) for 5 days after your procedure or as directed by your health care provider.  Ask your health care provider when it is okay for you: ? To return to work or school. ? To resume usual physical activities or sports. ? To resume sexual activity. Eating and drinking  Eat a heart-healthy diet. This should include plenty of fresh fruits and vegetables.  Avoid the following types of food: ? Food that is high in salt. ? Canned or highly processed food. ? Food that is high in saturated fat or  sugar. ? [employer].  Limit alcohol intake to no more than 1 drink a day for non-pregnant women and 2 drinks a day for men. One drink equals 12 oz of beer, 5 oz of wine, or 1 oz of hard liquor. Lifestyle  Do not use any products that contain nicotine or tobacco, such as cigarettes and e-cigarettes. If you need help quitting, ask your health care provider.  Take steps to manage and control your weight.  Get regular exercise.  Manage your blood pressure.  Manage other health problems, such as diabetes. General instructions  Take over-the-counter and prescription medicines only as told by your health care provider. Blood thinners may be prescribed after your procedure to improve blood flow through the stent.  If you need an MRI after your heart stent has been placed, be sure to tell the health care provider who orders the MRI that you have a heart stent.  Keep all follow-up  visits as directed by your health care provider. This is important. Contact a health care provider if:  You have a fever.  You have chills.  You have increased bleeding from the catheter insertion area. Hold pressure on the area. Get help right away if:  You develop chest pain or shortness of breath.  You feel faint or you pass out.  You have unusual pain at the catheter insertion area.  You have redness, warmth, or swelling at the catheter insertion area.  You have drainage (other than a small amount of blood on the dressing) from the catheter insertion area.  The catheter insertion area is bleeding, and the bleeding does not stop after 30 minutes of holding steady pressure on the area.  You develop bleeding from any other place, such as from your rectum. There may be bright red blood in your urine or stool, or it may appear as black, tarry stool. This information is not intended to replace advice given to you by your health care provider. Make sure you discuss any questions you have with your health  care provider. Document Released: 07/02/2005 Document Revised: 09/09/2016 Document Reviewed: 09/09/2016 Elsevier Interactive Patient Education  2018 [employer].  Coronary Angiogram With Stent, Care After This sheet gives you information about how to care for yourself after your procedure. Your health care provider may also give you more specific instructions. If you have problems or questions, contact your health care provider. What can I expect after the procedure? After your procedure, it is common to have:  Bruising in the area where a small, thin tube (catheter) was inserted. This usually fades within 1-2 weeks.  Blood collecting in the tissue (hematoma) that may be painful to the touch. It should usually decrease in size and tenderness within 1-2 weeks.  Follow these instructions at home: Insertion area care  Do not take baths, swim, or use a hot tub until your health care provider approves.  You may shower 24-48 hours after the procedure or as directed by your health care provider.  Follow instructions from your health care provider about how to take care of your incision. Make sure you: ? Wash your hands with soap and water before you change your bandage (dressing). If soap and water are not available, use hand sanitizer. ? Change your dressing as told by your health care provider. ? Leave stitches (sutures), skin glue, or adhesive strips in place. These skin closures may need to stay in place for 2 weeks or longer. If adhesive strip edges start to loosen and curl up, you may trim the loose edges. Do not remove adhesive strips completely unless your health care provider tells you to do that.  Remove the bandage (dressing) and gently wash the catheter insertion site with plain soap and water.  Pat the area dry with a clean towel. Do not rub the area, because that may cause bleeding.  Do not apply powder or lotion to the incision area.  Check your incision area every day for  signs of infection. Check for: ? More redness, swelling, or pain. ? More fluid or blood. ? Warmth. ? Pus or a bad smell. Activity  Do not drive for 24 hours if you were given a medicine to help you relax (sedative).  Do not lift anything that is heavier than 10 lb (4.5 kg) for 5 days after your procedure or as directed by your health care provider.  Ask your health care provider when it is okay for you: ?  To return to work or school. ? To resume usual physical activities or sports. ? To resume sexual activity. Eating and drinking  Eat a heart-healthy diet. This should include plenty of fresh fruits and vegetables.  Avoid the following types of food: ? Food that is high in salt. ? Canned or highly processed food. ? Food that is high in saturated fat or sugar. ? [employer].  Limit alcohol intake to no more than 1 drink a day for non-pregnant women and 2 drinks a day for men. One drink equals 12 oz of beer, 5 oz of wine, or 1 oz of hard liquor. Lifestyle  Do not use any products that contain nicotine or tobacco, such as cigarettes and e-cigarettes. If you need help quitting, ask your health care provider.  Take steps to manage and control your weight.  Get regular exercise.  Manage your blood pressure.  Manage other health problems, such as diabetes. General instructions  Take over-the-counter and prescription medicines only as told by your health care provider. Blood thinners may be prescribed after your procedure to improve blood flow through the stent.  If you need an MRI after your heart stent has been placed, be sure to tell the health care provider who orders the MRI that you have a heart stent.  Keep all follow-up visits as directed by your health care provider. This is important. Contact a health care provider if:  You have a fever.  You have chills.  You have increased bleeding from the catheter insertion area. Hold pressure on the area. Get help right away  if:  You develop chest pain or shortness of breath.  You feel faint or you pass out.  You have unusual pain at the catheter insertion area.  You have redness, warmth, or swelling at the catheter insertion area.  You have drainage (other than a small amount of blood on the dressing) from the catheter insertion area.  The catheter insertion area is bleeding, and the bleeding does not stop after 30 minutes of holding steady pressure on the area.  You develop bleeding from any other place, such as from your rectum. There may be bright red blood in your urine or stool, or it may appear as black, tarry stool. This information is not intended to replace advice given to you by your health care provider. Make sure you discuss any questions you have with your health care provider. Document Released: 07/02/2005 Document Revised: 09/09/2016 Document Reviewed: 09/09/2016 Elsevier Interactive Patient Education  2018 [place].  Moderate Conscious Sedation, Adult, Care After These instructions provide you with information about caring for yourself after your procedure. Your health care provider may also give you more specific instructions. Your treatment has been planned according to current medical practices, but problems sometimes occur. Call your health care provider if you have any problems or questions after your procedure. What can I expect after the procedure? After your procedure, it is common:  To feel sleepy for several hours.  To feel clumsy and have poor balance for several hours.  To have poor judgment for several hours.  To vomit if you eat too soon.  Follow these instructions at home: For at least 24 hours after the procedure:   Do not: ? Participate in activities where you could fall or become injured. ? Drive. ? Use heavy machinery. ? Drink alcohol. ? Take sleeping pills or medicines that cause drowsiness. ? Make important decisions or sign legal documents. ? Take  care of  children on your own.  Rest. Eating and drinking  Follow the diet recommended by your health care provider.  If you vomit: ? Drink water, juice, or soup when you can drink without vomiting. ? Make sure you have little or no nausea before eating solid foods. General instructions  Have a responsible adult stay with you until you are awake and alert.  Take over-the-counter and prescription medicines only as told by your health care provider.  If you smoke, do not smoke without supervision.  Keep all follow-up visits as told by your health care provider. This is important. Contact a health care provider if:  You keep feeling nauseous or you keep vomiting.  You feel light-headed.  You develop a rash.  You have a fever. Get help right away if:  You have trouble breathing. This information is not intended to replace advice given to you by your health care provider. Make sure you discuss any questions you have with your health care provider. Document Released: 10/03/2013 Document Revised: 05/17/2016 Document Reviewed: 04/03/2016 Elsevier Interactive Patient Education  [employer].

## 2018-08-29 NOTE — Interval H&P Note (Signed)
Cath Lab Visit (complete for each Cath Lab visit)  Clinical Evaluation Leading to the Procedure:   ACS: No.  Non-ACS:    Anginal Classification: CCS II  Anti-ischemic medical therapy: No Therapy  Non-Invasive Test Results: High-risk stress test findings: cardiac mortality >3%/year  Prior CABG: No previous CABG      History and Physical Interval Note:  08/29/2018 7:57 AM  [name]  has presented today for surgery, with the diagnosis of cad  The various methods of treatment have been discussed with the patient and family. After consideration of risks, benefits and other options for treatment, the patient has consented to  Procedure(s): CORONARY STENT INTERVENTION (N/A) as a surgical intervention .  The patient's history has been reviewed, patient examined, no change in status, stable for surgery.  I have reviewed the patient's chart and labs.  Questions were answered to the patient's satisfaction.     Sherren Mocha

## 2018-08-29 NOTE — Progress Notes (Signed)
1120-1220 Education completed with pt and wife who voiced understanding. Stressed importance of plavx with stent. Reviewed NTG use, gave heart healthy diet, ex ed and discussed CRP 2. Referred to [facility] program. Pt works in [employer] so he may not be able to attend. Pt is hopeful that something can be found to help cholesterol. Graylon Good RN BSN 08/29/2018 12:18 PM

## 2018-08-29 NOTE — Progress Notes (Signed)
Up and walked and tolerated well; c/o twinge in chest and Dr Burt Knack in and checked client and ok to discharge home

## 2018-08-29 NOTE — Discharge Summary (Signed)
Discharge Summary/SAME DAY PCI    Patient ID: Ronnie Vazquez,  MRN: [id number], DOB/AGE: 1946/09/30 72 y.o.  Admit date: 08/29/2018 Discharge date: 08/29/2018  Primary Care Provider: Lucretia Kern Primary Cardiologist: Dr. Gwenlyn Found   Discharge Diagnoses    Active Problems:   Coronary artery disease with exertional angina [facility])   Allergies No Known Allergies  Diagnostic Studies/Procedures    Cath: 08/29/18  Successful bifurcation PCI with stenting of the proximal LAD using a 3.0x30 mm Resolute Onyx DES and angioplasty of the first diagonal (through the LAD stent struts) with a 3.0 mm balloon  Recommend uninterrupted dual antiplatelet therapy with Aspirin 81mg  daily and Clopidogrel 75mg  daily for a minimum of 12 months with bifurcation lesion. _____________   History of Present Illness     72 y.o. male who presented for ongoing assessment and management of chest pain, hyperlipidemia (intolerant to statins), exertional dyspnea.  The patient was seen originally on 08/01/2018 as a new patient by Dr. Gwenlyn Found with exercise Myoview ordered in the setting of risk factors of hyperlipidemia, age, and symptoms.  Stress test completed on 08/04/2018 revealed high risk study, there was a deep effect noted of severe severity in the mid anterior mid anterior septal and apical anterior, apical septal and apex location. The patient had ST segment depression in V5, V6, II, III, aVF and 1 leads. He underwent cardiac cath on 08/08/18 showing lesion in the LAD with disease in a diagonal branch. Decision was made to consult with colleges and bring back for staged intervention.   Hospital Course     He presented back for staged intervention with Dr. Burt Knack with successful PCI/DES x1 with bifurcation of the pLAD through the first diag. Plan for DAPT with ASA/plavix for at least one year given the bifurcation stenting. Seen by cardiac rehab in short stay. Given instructions/precautions prior to discharge. Radial  site stable.    [name] was seen by Dr. Burt Knack and determined stable for discharge home. Follow up in the office has been arranged. Medications are listed below.   _____________  Discharge Vitals Blood pressure 106/70, pulse 61, temperature (!) 97.5 F (36.4 C), temperature source Oral, resp. rate 20, height 5' 10.5" (1.791 m), weight 88.5 kg, SpO2 94 %.  Filed Weights   08/29/18 0548  Weight: 88.5 kg    Labs & Radiologic Studies    CBC No results for input(s): WBC, NEUTROABS, HGB, HCT, MCV, PLT in the last 72 hours. Basic Metabolic Panel No results for input(s): NA, K, CL, CO2, GLUCOSE, BUN, CREATININE, CALCIUM, MG, PHOS in the last 72 hours. Liver Function Tests No results for input(s): AST, ALT, ALKPHOS, BILITOT, PROT, ALBUMIN in the last 72 hours. No results for input(s): LIPASE, AMYLASE in the last 72 hours. Cardiac Enzymes No results for input(s): CKTOTAL, CKMB, CKMBINDEX, TROPONINI in the last 72 hours. BNP Invalid input(s): POCBNP D-Dimer No results for input(s): DDIMER in the last 72 hours. Hemoglobin A1C No results for input(s): HGBA1C in the last 72 hours. Fasting Lipid Panel No results for input(s): CHOL, HDL, LDLCALC, TRIG, CHOLHDL, LDLDIRECT in the last 72 hours. Thyroid Function Tests No results for input(s): TSH, T4TOTAL, T3FREE, THYROIDAB in the last 72 hours.  Invalid input(s): FREET3 _____________  Dg Chest 2 View  Result Date: 08/10/2018 CLINICAL DATA:  Preoperative chest x-ray. EXAM: CHEST - 2 VIEW COMPARISON:  None. FINDINGS: The heart size borderline. The hila and mediastinum are normal. No pulmonary nodules, masses, or focal infiltrates.  IMPRESSION: No active cardiopulmonary disease. Electronically Signed   By: Dorise Bullion III M.D   On: 08/10/2018 11:52   Disposition   Pt is being discharged home today in good condition.  Follow-up Plans & Appointments    Follow-up Information    Barrett, Evelene Croon, PA-C Follow up on 09/07/2018.     Specialties:  Cardiology, Radiology Why:  at 11:30am for your follow up appt.  Contact information: [address] [place] [address] [phone]          Discharge Instructions    Amb Referral to Cardiac Rehabilitation   Complete by:  As directed    Diagnosis:  Coronary Stents      Discharge Medications     Medication List    TAKE these medications   aspirin 81 MG tablet Take 81 mg by mouth daily.   cetirizine 10 MG tablet Commonly known as:  ZYRTEC Take 10 mg by mouth daily as needed for allergies.   clopidogrel 75 MG tablet Commonly known as:  PLAVIX Take 1 tablet (75 mg total) by mouth daily.   diazepam 5 MG tablet Commonly known as:  VALIUM Take 1 tablet (5 mg total) by mouth daily as needed for anxiety.   nitroGLYCERIN 0.4 MG SL tablet Commonly known as:  NITROSTAT Place 1 tablet (0.4 mg total) under the tongue every 5 (five) minutes as needed for chest pain.   rosuvastatin 5 MG tablet Commonly known as:  CRESTOR Take 1 tablet (5 mg total) by mouth 2 (two) times a week.   TYLENOL 325 MG tablet Generic drug:  acetaminophen Take 650 mg by mouth at bedtime as needed (for sleep).        Acute coronary syndrome (MI, NSTEMI, STEMI, etc) this admission?: No.     Outstanding Labs/Studies   N/a   Duration of Discharge Encounter   Greater than 30 minutes including physician time.  Signed, Reino Bellis NP-C 08/29/2018, 1:54 PM

## 2018-08-30 ENCOUNTER — Encounter (HOSPITAL_COMMUNITY): Payer: Self-pay | Admitting: Cardiovascular Disease

## 2018-08-31 ENCOUNTER — Telehealth (HOSPITAL_COMMUNITY): Payer: Self-pay

## 2018-08-31 ENCOUNTER — Telehealth: Payer: Self-pay | Admitting: Cardiovascular Disease

## 2018-08-31 DIAGNOSIS — E7800 Pure hypercholesterolemia, unspecified: Secondary | ICD-10-CM

## 2018-08-31 NOTE — Telephone Encounter (Signed)
Patients insurance is active and benefits verified through Medicare A/B - No co-pay, deductible amount of $185.00/$185.00 has been met, no out of pocket, 20% co-insurance, and no pre-authorization is required. Passport/reference [phone]  Patients insurance is active and benefits verified through [employer] [place] - No co-pay, no deductible, no out of pocket, no co-insurance, and no pre-authorization is required. Passport/reference [phone]

## 2018-08-31 NOTE — Telephone Encounter (Signed)
Pt. Came into the office today to ask about an appointment he has on 9-12 with Rhonda Barrett.  He does not want to continue to follow with Dr Gwenlyn Found.  Dr. Burt Knack did a stent and he or his team is who he would like to see. Please check and see if this could be worked out with Dr Janyth Contes and call the patient back.

## 2018-08-31 NOTE — Telephone Encounter (Signed)
Called and spoke with patient in regards to Cardiac Rehab - Scheduled orientation on 10/17/18 at 1:30pm. Patient will attend the 11:15am exc class. Mailed packet.

## 2018-09-01 NOTE — Telephone Encounter (Signed)
Mr. Largo has several concerns he wants addressed. 1) he would like to switch from Dr. Gwenlyn Found to Dr. Burt Knack for general cardiology follow-up. He understands there is a policy for this and he will be called with an update.  2) he cannot make his appointment next week for TOC because he will be in [place]. Rescheduled him to 9/16 with Richardson Dopp. He will be riding the train to the city. Informed him it is OK to ride the train and that he would be called prior to departure Tuesday if MD deems unsafe.   3) he cannot tolerate Crestor. He is currently not taking because it made his legs hurt. He understands if he is not given alternative medication therapy recommendations prior to visit with Nicki Reaper, it will be addressed at that time.

## 2018-09-03 NOTE — Telephone Encounter (Signed)
I was anticipating this and have no prob with it

## 2018-09-03 NOTE — Telephone Encounter (Signed)
I'm fine to see him if ok with JB  Would refer to lipid clinic

## 2018-09-04 NOTE — Addendum Note (Signed)
Addended by: Drue Novel I on: 09/04/2018 08:38 AM   Modules accepted: Orders

## 2018-09-04 NOTE — Telephone Encounter (Signed)
Called patient and made him aware that Dr. Burt Knack and Dr. Gwenlyn Found both were okay with switching to Dr. Burt Knack as his primary cardiologist. Instructed patient to keep his appointment with Richardson Dopp, PA on 09/11/18. [facility] referral and patient scheduled in [facility] on 09/11/18 at 1:30 PM, same day as appointment with SW. Instructed patient to stay hydrated and get up, stretch, and walk around frequently while on the train. Patient verbalized understanding and thanked me for the call.

## 2018-09-06 ENCOUNTER — Other Ambulatory Visit: Payer: Self-pay | Admitting: Pharmacist Clinician (PhC)/ Clinical Pharmacy Specialist

## 2018-09-06 MED ORDER — EVOLOCUMAB 140 MG/ML ~~LOC~~ SOAJ: 140.0000 mg | SUBCUTANEOUS | 12 refills | Status: DC

## 2018-09-07 ENCOUNTER — Ambulatory Visit: Payer: Medicare Other | Admitting: Physician Assistant

## 2018-09-11 ENCOUNTER — Ambulatory Visit (INDEPENDENT_AMBULATORY_CARE_PROVIDER_SITE_OTHER): Payer: No Typology Code available for payment source | Admitting: Pharmacist

## 2018-09-11 ENCOUNTER — Ambulatory Visit (INDEPENDENT_AMBULATORY_CARE_PROVIDER_SITE_OTHER): Payer: Medicare Other | Admitting: Physician Assistant

## 2018-09-11 ENCOUNTER — Encounter: Payer: Self-pay | Admitting: Physician Assistant

## 2018-09-11 ENCOUNTER — Encounter: Payer: Self-pay | Admitting: Pharmacist

## 2018-09-11 VITALS — BP 120/80 | HR 68 | Ht 70.5 in | Wt 199.8 lb

## 2018-09-11 DIAGNOSIS — I2510 Atherosclerotic heart disease of native coronary artery without angina pectoris: Secondary | ICD-10-CM

## 2018-09-11 DIAGNOSIS — E7800 Pure hypercholesterolemia, unspecified: Secondary | ICD-10-CM

## 2018-09-11 MED ORDER — ATORVASTATIN CALCIUM 10 MG PO TABS: 10.0000 mg | ORAL_TABLET | Freq: Every day | ORAL | 3 refills | Status: DC

## 2018-09-11 NOTE — Progress Notes (Signed)
Cardiology Office Note:    Date:  09/11/2018   ID:  Ronnie Vazquez, DOB 02-19-1946, MRN [id number]  PCP:  Ronnie Kern, DO  Cardiologist:  Ronnie Mocha, MD  Electrophysiologist:  None   Referring MD: Ronnie Kern, DO   Chief Complaint  Patient presents with  . Hospitalization Follow-up    Status post PCI    History of Present Illness:    Ronnie Vazquez is a 72 y.o. male with coronary artery disease.  He underwent cardiac catheterization in August 2019 which demonstrated complex bifurcational disease of the proximal LAD/first diagonal with associated CCS class II angina.  Coronary artery bypass grafting was initially recommended.  However, the patient preferred proceeding with percutaneous coronary intervention.  He was seen by Dr. Burt Vazquez 08/21/2018.  It was felt that PCI would be fairly complex placing him at higher risk of periprocedural myocardial infarction.  The patient accepted this risk and was willing to proceed.  The patient underwent successful bifurcation PCI with DES to the proximal LAD and angioplasty of the first diagonal through the LAD stent struts on 08/29/2018.  Post PCI course was uneventful.  Ronnie Vazquez returns for post hospital follow up.  He is here alone.  He is doing well.  He has been slowly increasing his activity.  He plans to go to cardiac rehabilitation.  He has not had recurrent angina.  But, he is having a hard time deciding if symptoms are concerning or not.  We discussed that this is a common issue with patients who have gone through a myocardial infarction or PCI.  I reassured him that this will improve over time.  He has not had shortness of breath, syncope, leg swelling.  He has noticed some nausea and dizziness after standing abruptly.  He was seen by the [facility] today.  He is going to try Atorvastatin 10 mg QD.  If he cannot tolerate this, he may be able to qualify for a PCSK-9 inhibitor.    Prior CV studies:   The following studies were reviewed  today:  Percutaneous coronary intervention 08/29/2018 Successful bifurcation PCI with stenting of the proximal LAD using a 3.0x30 mm Resolute Onyx DES and angioplasty of the first diagonal (through the LAD stent struts) with a 3.0 mm balloon         Pre CABG Dopplers 08/10/18 Final Interpretation: Right Carotid: Velocities in the right ICA are consistent with a 1-39% stenosis. Left Carotid: Velocities in the left ICA are consistent with a 1-39% stenosis. Right ABI: Pedal artery waveform within normal limits. Left ABI: Pedal artery waveform within normal limits.  Cardiac Catheterization 08/08/18  Prox LAD-1 lesion is 90% stenosed.  Prox LAD-2 lesion is 70% stenosed.  The left ventricular systolic function is normal.  LV end diastolic pressure is normal.  The left ventricular ejection fraction is 55-65% by visual estimate.  Nuclear stress test 08/04/18  The left ventricular ejection fraction is mildly decreased (45-54%).  Nuclear stress EF: 54%.  ST segment depression was noted during stress in the V5, V6, II, III, aVF and I leads.  Blood pressure demonstrated a hypotensive response to exercise.  Defect 1: There is a large defect of severe severity present in the mid anterior, mid anteroseptal, apical anterior, apical septal and apex location.  Findings consistent with ischemia.  This is a high risk study.  Echo 08/13/2013 Moderate LVH, EF 65   Past Medical History:  Diagnosis Date  . Chicken pox    hx  .  Coronary artery disease    08/29/18 PCI/DES with bifurcation from LAD into 1st diag.   Marland Kitchen Hyperlipemia 02/02/2013  . Hyperlipidemia   . Left shoulder pain 02/02/2013  . Microscopic hematuria 02/02/2013  . Vision loss of left eye    Surgical Hx: The patient  has a past surgical history that includes Tonsillectomy and adenoidectomy (1959); LEFT HEART CATH AND CORONARY ANGIOGRAPHY (N/A, 08/08/2018); Colonoscopy; CORONARY STENT INTERVENTION (N/A, 08/29/2018); and CORONARY BALLOON  ANGIOPLASTY (N/A, 08/29/2018).   Current Medications: Current Meds  Medication Sig  . acetaminophen (TYLENOL) 325 MG tablet Take 650 mg by mouth at bedtime as needed (for sleep).   Marland Kitchen aspirin 81 MG tablet Take 81 mg by mouth daily.  Marland Kitchen atorvastatin (LIPITOR) 10 MG tablet Take 1 tablet (10 mg total) by mouth daily.  . cetirizine (ZYRTEC) 10 MG tablet Take 10 mg by mouth daily as needed for allergies.  Marland Kitchen clopidogrel (PLAVIX) 75 MG tablet Take 1 tablet (75 mg total) by mouth daily.  . nitroGLYCERIN (NITROSTAT) 0.4 MG SL tablet Place 1 tablet (0.4 mg total) under the tongue every 5 (five) minutes as needed for chest pain.  . [DISCONTINUED] Evolocumab (REPATHA SURECLICK) 324 MG/ML SOAJ Inject 140 mg into the skin every 14 (fourteen) days.  . [DISCONTINUED] rosuvastatin (CRESTOR) 5 MG tablet Take 1 tablet (5 mg total) by mouth 2 (two) times a week.     Allergies:   Patient has no known allergies.   Social History   Tobacco Use  . Smoking status: Never Smoker  . Smokeless tobacco: Never Used  Substance Use Topics  . Alcohol use: Yes    Alcohol/week: 2.0 standard drinks    Types: 2 Glasses of wine per week  . Drug use: No     Family Hx: The patient's family history includes Prostate cancer in his father. There is no history of Colon cancer, Esophageal cancer, Pancreatic cancer, Rectal cancer, Stomach cancer, or Heart disease.  ROS:   Please see the history of present illness.    ROS All other systems reviewed and are negative.   EKGs/Labs/Other Test Reviewed:    EKG:  EKG is   ordered today.  The ekg ordered today demonstrates normal sinus rhythm, heart rate 68, normal axis, QTC 433, similar to old tracings  Recent Labs: 08/10/2018: ALT 31; BUN 21; Creatinine, Ser 1.33; Hemoglobin 14.6; Platelets 212; Potassium 4.2; Sodium 138   Recent Lipid Panel Lab Results  Component Value Date/Time   CHOL 224 (H) 08/12/2017 09:21 AM   TRIG 301.0 (H) 08/12/2017 09:21 AM   HDL 42.00 08/12/2017  09:21 AM   CHOLHDL 5 08/12/2017 09:21 AM   LDLDIRECT 117.0 08/12/2017 09:21 AM    Physical Exam:    VS:  BP 120/80   Pulse 68   Ht 5' 10.5" (1.791 m)   Wt 199 lb 12.8 oz (90.6 kg)   BMI 28.26 kg/m     Wt Readings from Last 3 Encounters:  09/11/18 199 lb 12.8 oz (90.6 kg)  08/29/18 195 lb (88.5 kg)  08/21/18 201 lb 6.4 oz (91.4 kg)     Physical Exam  Constitutional: He is oriented to person, place, and time. He appears well-developed and well-nourished. No distress.  HENT:  Head: Normocephalic and atraumatic.  Eyes: No scleral icterus.  Neck: No JVD present.  Cardiovascular: Normal rate and regular rhythm.  No murmur heard. Pulmonary/Chest: Effort normal. He has no rales.  Abdominal: Soft. He exhibits no distension.  Musculoskeletal: He exhibits no edema.  R wrist without hematoma  Neurological: He is alert and oriented to person, place, and time.  Skin: Skin is warm and dry.    ASSESSMENT & PLAN:    Coronary artery disease involving native coronary artery of native heart without angina pectoris Status post recent complex LAD/diagonal PCI with DES to the LAD and angioplasty to the diagonal through the strut of the stent.  He is doing well without recurrent angina.  Continue dual antiplatelet therapy with aspirin, Plavix.  He will start cardiac rehabilitation on October.  I have encouraged him to continue to increase his activity.  Hypercholesterolemia Followed by the lipid clinic.  He has been placed on low-dose atorvastatin.  If he cannot tolerate this, he will likely need PCSK-9 inhibitor therapy.  Dispo:  Return in about 3 months (around 12/11/2018) for Routine Follow Up, w/ Dr. Burt Vazquez.   Medication Adjustments/Labs and Tests Ordered: Current medicines are reviewed at length with the patient today.  Concerns regarding medicines are outlined above.  Tests Ordered: Orders Placed This Encounter  Procedures  . EKG 12-Lead   Medication Changes: No orders of the  defined types were placed in this encounter.   Signed, Richardson Dopp, PA-C  09/11/2018 3:02 PM    [facility] [address], [place], [place]  [address] Phone: [phone]; Fax: [phone]

## 2018-09-11 NOTE — Progress Notes (Signed)
Patient ID: Ronnie Vazquez                 DOB: May 30, 1946                    MRN: [id number]     HPI: Ronnie Vazquez is a 72 y.o. male patient of Dr. Burt Knack that presents today for lipid evaluation.  PMH includes CAD, now status post stent placement in LAD on 08/29/18. He has previously been unable to tolerate statin therapy.   He presents today for discussion of cholesterol. He state that he took rosuvastatin at two different doses. This is the only statin that he has tried. He states he took the 10mg  dose for 3-4 days and developed symptoms. He stopped this dose and then tried 5mg  every few days and after a few doses the symptoms came right back. He states each time it took about 4-5 days for the symptoms to resolve, but they did resolve once the medication was stopped.   Risk Factors: CAD s/p stent LDL Goal: <70 nonHDL <100  Current Medications: rosuvastatin 5mg  twice weekly Intolerances: rosuvastatin 5mg  twice weekly, 10mg  daily (muscle aches - walking like he is 72yo)  Diet: He eats steaks, however he has started eating cereal (raisin bran, cheerios). Lunch salad and chicken and some seafood. He has not had hamburger since his event. He drinks mostly water, occasional wine. He drinks coffee with only a tiny bit of cream.   Exercise: He is a member of [employer]. He was working out on treadmill for 30 minutes. He developed chest pain after his work out routine before.   Family History: brother and sister with high cholesterol, Prostate cancer in his father  Social History: The patient  reports that he has never smoked. He has never used smokeless tobacco. He reports that he drinks about 2.0 standard drinks of alcohol per week. He reports that he does not use drugs.  Labs: 08/12/18:  TC 224, TG 301, HDL 42, nonHDL 182, LDL-d 117 (no therapy)  Past Medical History:  Diagnosis Date  . Chicken pox    hx  . Coronary artery disease    08/29/18 PCI/DES with bifurcation from LAD into 1st  diag.   Marland Kitchen Hyperlipemia 02/02/2013  . Hyperlipidemia   . Left shoulder pain 02/02/2013  . Microscopic hematuria 02/02/2013  . Vision loss of left eye     Current Outpatient Medications on File Prior to Visit  Medication Sig Dispense Refill  . acetaminophen (TYLENOL) 325 MG tablet Take 650 mg by mouth at bedtime as needed (for sleep).     Marland Kitchen aspirin 81 MG tablet Take 81 mg by mouth daily.    . cetirizine (ZYRTEC) 10 MG tablet Take 10 mg by mouth daily as needed for allergies.    Marland Kitchen clopidogrel (PLAVIX) 75 MG tablet Take 1 tablet (75 mg total) by mouth daily. 90 tablet 3  . diazepam (VALIUM) 5 MG tablet Take 1 tablet (5 mg total) by mouth daily as needed for anxiety. 20 tablet 0  . Evolocumab (REPATHA SURECLICK) 814 MG/ML SOAJ Inject 140 mg into the skin every 14 (fourteen) days. 2 pen 12  . nitroGLYCERIN (NITROSTAT) 0.4 MG SL tablet Place 1 tablet (0.4 mg total) under the tongue every 5 (five) minutes as needed for chest pain. 25 tablet 3  . rosuvastatin (CRESTOR) 5 MG tablet Take 1 tablet (5 mg total) by mouth 2 (two) times a week. 24 tablet 3  No current facility-administered medications on file prior to visit.     No Known Allergies  Assessment/Plan: Hyperlipidemia: LDL is not at goal. Pt has tried only one statin at 2 different doses. Will trial another statin atorvastatin 10mg  daily. If he is unable to tolerate this will pursue PCSK9i therapy (which was discussed today and he has already been approved for). He will call with any issues tolerating statin therapy. Will call in 1 month to check on tolerability as well. If tolerating well will order schedule labs for determination of need to titrate dose. Pt is in agreement with this plan.    Thank you,  Lelan Pons. Patterson Hammersmith, [facility]  09/11/2018 12:48 PM

## 2018-09-11 NOTE — Patient Instructions (Addendum)
START taking atorvastatin 10mg  daily in the evening.   STOP rosuvastatin  If you develop issues with muscle pains please call the clinic [phone].    Cholesterol Cholesterol is a fat. Your body needs a small amount of cholesterol. Cholesterol (plaque) may build up in your blood vessels (arteries). That makes you more likely to have a heart attack or stroke. You cannot feel your cholesterol level. Having a blood test is the only way to find out if your level is high. Keep your test results. Work with your doctor to keep your cholesterol at a good level. What do the results mean?  Total cholesterol is how much cholesterol is in your blood.  LDL is bad cholesterol. This is the type that can build up. Try to have low LDL.  HDL is good cholesterol. It cleans your blood vessels and carries LDL away. Try to have high HDL.  Triglycerides are fat that the body can store or burn for energy. What are good levels of cholesterol?  Total cholesterol below 200.  LDL below 100 is good for people who have health risks. LDL below 70 is good for people who have very high risks.  HDL above 40 is good. It is best to have HDL of 60 or higher.  Triglycerides below 150. How can I lower my cholesterol? Diet Follow your diet program as told by your doctor.  Choose fish, white meat chicken, or [place] that is roasted or baked. Try not to eat red meat, fried foods, sausage, or lunch meats.  Eat lots of fresh fruits and vegetables.  Choose whole grains, beans, pasta, potatoes, and cereals.  Choose olive oil, corn oil, or canola oil. Only use small amounts.  Try not to eat butter, mayonnaise, shortening, or palm kernel oils.  Try not to eat foods with trans fats.  Choose low-fat or nonfat dairy foods. ? Drink skim or nonfat milk. ? Eat low-fat or nonfat yogurt and cheeses. ? Try not to drink whole milk or cream. ? Try not to eat ice cream, egg yolks, or full-fat cheeses.  Healthy desserts  include angel food cake, ginger snaps, animal crackers, hard candy, popsicles, and low-fat or nonfat frozen yogurt. Try not to eat pastries, cakes, pies, and cookies.  Exercise Follow your exercise program as told by your doctor.  Be more active. Try gardening, walking, and taking the stairs.  Ask your doctor about ways that you can be more active.  Medicine  Take over-the-counter and prescription medicines only as told by your doctor. This information is not intended to replace advice given to you by your health care provider. Make sure you discuss any questions you have with your health care provider. Document Released: 03/11/2009 Document Revised: 07/14/2016 Document Reviewed: 06/24/2016 Elsevier Interactive Patient Education  [employer].

## 2018-09-11 NOTE — Patient Instructions (Addendum)
Medication Instructions:   Your physician recommends that you continue on your current medications as directed. Please refer to the Current Medication list given to you today.   If you need a refill on your cardiac medications before your next appointment, please call your pharmacy.  Labwork: NONE ORDERED  TODAY    Testing/Procedures: NONE ORDERED  TODAY    Follow-Up:  IN 3 MONTHS WITH [facility]   (CANCEL APPT WITH [facility])     Any Other Special Instructions Will Be Listed Below (If Applicable).

## 2018-09-28 ENCOUNTER — Telehealth: Payer: Self-pay | Admitting: Cardiovascular Disease

## 2018-09-28 NOTE — Telephone Encounter (Signed)
Spoke with patient who reports that he has been having some pain in his lower back that occasionally radiates to his stomach area. Discussed that pain radiating to front does not sound statin related, but pain in lower back could be. He states he has not done any lifting or workouts that would affect his back. He has improved his diet and been eating more vegetables and dramatically less meat. Discussed stopping atorvastatin for 1 week to see if symptoms improve. He would prefer to have cholesterol and hepatic panel checked tomorrow first to see if LDL has come down as current pain level is somewhat tolerable if LDL has improved. He is willing to try Repatha if no improvement or LDL is not down.   We will call him to discuss once labs have resulted.

## 2018-09-28 NOTE — Telephone Encounter (Signed)
  Pt c/o medication issue: 1. Name of Medication: atorvastatin (LIPITOR) 10 MG tablet  2. How are you currently taking this medication (dosage and times per day)? Take 1 tablet (10 mg total) by mouth daily.  3. Are you having a reaction (difficulty breathing--STAT)? Cramping in lower back for week or so and now also having soreness in front, feels like when you are constipated  4. What is your medication issue? Wants to know if the symptoms could be coming from medication and if so what to do

## 2018-09-29 ENCOUNTER — Ambulatory Visit: Payer: Medicare Other | Admitting: Cardiovascular Disease

## 2018-09-29 ENCOUNTER — Other Ambulatory Visit: Payer: Medicare Other | Admitting: *Deleted

## 2018-09-29 DIAGNOSIS — E7800 Pure hypercholesterolemia, unspecified: Secondary | ICD-10-CM | POA: Diagnosis not present

## 2018-09-29 LAB — LIPID PANEL
CHOLESTEROL TOTAL: 145 mg/dL (ref 100–199)
Chol/HDL Ratio: 3.7 ratio (ref 0.0–5.0)
HDL: 39 mg/dL — ABNORMAL LOW (ref >39)
LDL Calculated: 75 mg/dL (ref 0–99)
TRIGLYCERIDES: 157 mg/dL — AB (ref 0–149)
VLDL CHOLESTEROL CAL: 31 mg/dL (ref 5–40)

## 2018-09-29 LAB — HEPATIC FUNCTION PANEL
ALT: 21 IU/L (ref 0–44)
AST: 21 IU/L (ref 0–40)
Albumin: 4.2 g/dL (ref 3.6–4.8)
Alkaline Phosphatase: 45 IU/L (ref 39–117)
BILIRUBIN, DIRECT: 0.12 mg/dL (ref 0.00–0.40)
Bilirubin Total: 0.4 mg/dL (ref 0.0–1.2)
Total Protein: 6.6 g/dL (ref 6.0–8.5)

## 2018-10-02 ENCOUNTER — Encounter: Payer: Self-pay | Admitting: *Deleted

## 2018-10-02 ENCOUNTER — Telehealth: Payer: Self-pay | Admitting: Physician Assistant

## 2018-10-02 NOTE — Telephone Encounter (Signed)
Spoke to patient who was concerned with sweating and a flush feeling he has felt the past couple of days.  Today he is feeling fine and wondered if the Atorvastatin he stopped last Friday, because of back pain had anything to do with it.  He will monitor this throughout the week and inform us of any changes.

## 2018-10-02 NOTE — Telephone Encounter (Signed)
LDL 75 on atorvastatin 10mg  daily. Spoke with pt regarding tolerability - he stopped taking atorvastatin the night before his labs were drawn. He has not noticed any improvement yet, however reports that when he stopped his rosuvastatin previously, it took 5 days for his myalgias to improve. He will continue off of atorvastatin for now and will touch base with [place] within the next week to let us know if symptoms have resolved. If so, symptoms are likely statin related and will pursue PCSK9i therapy.  He mentioned that he has made many dietary improvements over the past 6 weeks as well - his TG have improved by 50% since last cholesterol check. Encouraged pt to continue with dietary modifications.

## 2018-10-02 NOTE — Telephone Encounter (Signed)
New Message:       Pt states he has a low grade fever and some sweating on his feet. He states his temp has been around 97.2/97.5 and he has been feeling a little flushed for the last 18 hrs. He states he not sure if it has anything to do with the stents he had put in or not.

## 2018-10-02 NOTE — Telephone Encounter (Signed)
lpmtcb 10/7

## 2018-10-04 ENCOUNTER — Telehealth: Payer: Self-pay | Admitting: Family Medicine

## 2018-10-04 NOTE — Telephone Encounter (Signed)
Returned call to patient. He is using OTC allergy medicine PRN w/ relief of allergy symptoms.  Recommend he call cardiology regarding continued hot flashes since stent placement. Patient verbalized understanding of instructions.

## 2018-10-04 NOTE — Telephone Encounter (Signed)
Copied from [place] [phone]. Topic: Inquiry >> Oct 04, 2018  8:52 AM Margot Ables wrote: Reason for CRM: pt states he's been having some hot flashes since stent placement 5 weeks ago. He has been off atorvastatin for 5 days and wondering if this might be a side effect or if it may be something else. Pt also having some allergies, nasal drainage, and eyes watery. Pt requesting call back from Dr. Maudie Mercury or her nurse.

## 2018-10-05 DIAGNOSIS — Z7982 Long term (current) use of aspirin: Secondary | ICD-10-CM | POA: Diagnosis not present

## 2018-10-05 DIAGNOSIS — Z79899 Other long term (current) drug therapy: Secondary | ICD-10-CM | POA: Insufficient documentation

## 2018-10-05 DIAGNOSIS — R0789 Other chest pain: Secondary | ICD-10-CM | POA: Diagnosis not present

## 2018-10-05 DIAGNOSIS — R079 Chest pain, unspecified: Secondary | ICD-10-CM | POA: Insufficient documentation

## 2018-10-06 ENCOUNTER — Encounter (HOSPITAL_COMMUNITY): Payer: Self-pay

## 2018-10-06 ENCOUNTER — Emergency Department (HOSPITAL_COMMUNITY)
Admission: EM | Admit: 2018-10-06 | Discharge: 2018-10-06 | Disposition: A | Discharge status: Home / Self Care | Payer: Medicare Other | Attending: Emergency Medicine | Admitting: Emergency Medicine

## 2018-10-06 ENCOUNTER — Other Ambulatory Visit: Payer: Self-pay

## 2018-10-06 ENCOUNTER — Ambulatory Visit (INDEPENDENT_AMBULATORY_CARE_PROVIDER_SITE_OTHER): Payer: Medicare Other | Admitting: Physician Assistant

## 2018-10-06 ENCOUNTER — Telehealth: Payer: Self-pay | Admitting: Pharmacist

## 2018-10-06 ENCOUNTER — Encounter: Payer: Self-pay | Admitting: Physician Assistant

## 2018-10-06 ENCOUNTER — Emergency Department (HOSPITAL_COMMUNITY): Payer: Medicare Other

## 2018-10-06 VITALS — BP 100/70 | HR 75 | Temp 97.7°F | Ht 70.0 in | Wt 193.0 lb

## 2018-10-06 DIAGNOSIS — R059 Cough, unspecified: Secondary | ICD-10-CM

## 2018-10-06 DIAGNOSIS — R05 Cough: Secondary | ICD-10-CM

## 2018-10-06 DIAGNOSIS — R079 Chest pain, unspecified: Secondary | ICD-10-CM

## 2018-10-06 LAB — BASIC METABOLIC PANEL
ANION GAP: 10 (ref 5–15)
BUN: 24 mg/dL — AB (ref 8–23)
CHLORIDE: 107 mmol/L (ref 98–111)
CO2: 22 mmol/L (ref 22–32)
Calcium: 9.4 mg/dL (ref 8.9–10.3)
Creatinine, Ser: 1.28 mg/dL — ABNORMAL HIGH (ref 0.61–1.24)
GFR calc Af Amer: >60 mL/min (ref >60)
GFR calc non Af Amer: 55 mL/min — ABNORMAL LOW (ref >60)
GLUCOSE: 92 mg/dL (ref 70–99)
POTASSIUM: 4.1 mmol/L (ref 3.5–5.1)
SODIUM: 139 mmol/L (ref 135–145)

## 2018-10-06 LAB — I-STAT TROPONIN, ED
Troponin i, poc: 0 ng/mL (ref 0.00–0.08)
Troponin i, poc: 0.01 ng/mL (ref 0.00–0.08)

## 2018-10-06 LAB — CBC
HEMATOCRIT: 43.5 % (ref 39.0–52.0)
Hemoglobin: 14.5 g/dL (ref 13.0–17.0)
MCH: 32.6 pg (ref 26.0–34.0)
MCHC: 33.3 g/dL (ref 30.0–36.0)
MCV: 97.8 fL (ref 80.0–100.0)
Platelets: 209 10*3/uL (ref 150–400)
RBC: 4.45 MIL/uL (ref 4.22–5.81)
RDW: 12.3 % (ref 11.5–15.5)
WBC: 6.3 10*3/uL (ref 4.0–10.5)
nRBC: 0 % (ref 0.0–0.2)

## 2018-10-06 MED ORDER — EVOLOCUMAB 140 MG/ML ~~LOC~~ SOAJ: 140.0000 mg | SUBCUTANEOUS | 11 refills | Status: DC

## 2018-10-06 MED ORDER — FAMOTIDINE 10 MG PO TABS: 10.0000 mg | ORAL_TABLET | Freq: Two times a day (BID) | ORAL | 0 refills | Status: DC

## 2018-10-06 NOTE — ED Notes (Signed)
Patient verbalizes understanding of discharge instructions. Opportunity for questioning and answers were provided. Armband removed by staff, pt discharged from ED ambulatory.   

## 2018-10-06 NOTE — ED Notes (Signed)
ED Provider at bedside. 

## 2018-10-06 NOTE — Telephone Encounter (Signed)
Spoke with patient who states that his back is feeling better off the atorvastatin. He is now intolerant to 2 statin medications. We will proceed with Repatha as previously discussed. As stated previously he is approved for Repatha already through insurance. [facility] now that they are filling in house (rather than shipping to specialty).   He was also in the hospital last night for what he thought was chest pain. He was relieved to find out it was not his heart, but now is wondering what is causing him to feel this way. He states he has had a "head and chest cold" for about 10 days now and over the last few days developed 'hot flashes' with it. I advised he make an appt with his primary care doctor as may be something that requires antibiotics. He will do this. He also asks that I make Dr. Burt Knack aware of his visit to the ED. Will route to Dr. Burt Knack as Juluis Rainier.

## 2018-10-06 NOTE — ED Triage Notes (Signed)
Pt here for central chest pain that increases with deep breathing.  Having more and more pressure over the last 3 days. Had stent placed here by MD Burt Knack done 5 weeks ago.  A&Ox4.

## 2018-10-06 NOTE — Progress Notes (Signed)
Ronnie Vazquez is a 72 y.o. male here for a new problem.  I acted as a Education administrator for [employer], PA-C Anselmo Pickler, LPN  History of Present Illness:   Chief Complaint  Patient presents with  . Cough    Cough  This is a new problem. Episode onset: Started Monday. The problem occurs every few hours. The cough is productive of sputum (white). Pertinent negatives include no headaches, nasal congestion, postnasal drip, shortness of breath or wheezing. Associated symptoms comments: Hot flashes. Nothing aggravates the symptoms. Risk factors: Was in [place] this past week, returned yesterday. He has tried nothing for the symptoms. There is no history of bronchitis or pneumonia.   Symptoms last night were reminescent of his prior cardiac issues, and went to the ER for further work-up. Troponins were negative. CXR looked great. EKG was normal. He was told that it wasn't his heart. He is convinced that it may be his esophagus or lungs instead. He has not had any fever. He reports that he has been belching more recently.    Past Medical History:  Diagnosis Date  . Chicken pox    hx  . Coronary artery disease    08/29/18 PCI/DES with bifurcation from LAD into 1st diag.   Marland Kitchen Hyperlipemia 02/02/2013  . Hyperlipidemia   . Left shoulder pain 02/02/2013  . Microscopic hematuria 02/02/2013  . Vision loss of left eye      Social History   Socioeconomic History  . Marital status: Married    Spouse name: Not on file  . Number of children: Not on file  . Years of education: Not on file  . Highest education level: Not on file  Occupational History  . Not on file  Social Needs  . Financial resource strain: Not on file  . Food insecurity:    Worry: Not on file    Inability: Not on file  . Transportation needs:    Medical: Not on file    Non-medical: Not on file  Tobacco Use  . Smoking status: Never Smoker  . Smokeless tobacco: Never Used  Substance and Sexual Activity  . Alcohol use: Yes     Alcohol/week: 2.0 standard drinks    Types: 2 Glasses of wine per week  . Drug use: No  . Sexual activity: Not on file  Lifestyle  . Physical activity:    Days per week: Not on file    Minutes per session: Not on file  . Stress: Not on file  Relationships  . Social connections:    Talks on phone: Not on file    Gets together: Not on file    Attends religious service: Not on file    Active member of club or organization: Not on file    Attends meetings of clubs or organizations: Not on file    Relationship status: Not on file  . Intimate partner violence:    Fear of current or ex partner: Not on file    Emotionally abused: Not on file    Physically abused: Not on file    Forced sexual activity: Not on file  Other Topics Concern  . 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 COLONOSCOPY    . CORONARY BALLOON ANGIOPLASTY N/A 08/29/2018   Procedure: CORONARY BALLOON ANGIOPLASTY;  Surgeon: Sherren Mocha, MD;  Location: [facility];  Service: Cardiovascular;  Laterality: N/A;  . CORONARY STENT INTERVENTION N/A 08/29/2018   Procedure: CORONARY STENT INTERVENTION;  Surgeon: Sherren Mocha, MD;  Location: [facility];  Service: Cardiovascular;  Laterality: N/A;  . LEFT HEART CATH AND CORONARY ANGIOGRAPHY N/A 08/08/2018   Procedure: LEFT HEART CATH AND CORONARY ANGIOGRAPHY;  Surgeon: Lorretta Harp, MD;  Location: [facility];  Service: Cardiovascular;  Laterality: N/A;  . TONSILLECTOMY AND ADENOIDECTOMY  1959    Family History  Problem Relation Age of Onset  . Prostate cancer Father   . Colon cancer Neg Hx   . Esophageal cancer Neg Hx   . Pancreatic cancer Neg Hx   . Rectal cancer Neg Hx   . Stomach cancer Neg Hx    . Heart disease Neg Hx     No Known Allergies  Current Medications:   Current Outpatient Medications:  .  acetaminophen (TYLENOL) 325 MG tablet, Take 650 mg by mouth at bedtime as needed (for sleep). , Disp: , Rfl:  .  aspirin 81 MG tablet, Take 81 mg by mouth daily., Disp: , Rfl:  .  cetirizine (ZYRTEC) 10 MG tablet, Take 10 mg by mouth daily as needed for allergies., Disp: , Rfl:  .  clopidogrel (PLAVIX) 75 MG tablet, Take 1 tablet (75 mg total) by mouth daily., Disp: 90 tablet, Rfl: 3 .  Evolocumab (REPATHA SURECLICK) 660 MG/ML SOAJ, Inject 140 mg into the skin every 14 (fourteen) days., Disp: 2 pen, Rfl: 11 .  nitroGLYCERIN (NITROSTAT) 0.4 MG SL tablet, Place 1 tablet (0.4 mg total) under the tongue every 5 (five) minutes as needed for chest pain., Disp: 25 tablet, Rfl: 3 .  famotidine (PEPCID) 10 MG tablet, Take 1 tablet (10 mg total) by mouth 2 (two) times daily., Disp: 60 tablet, Rfl: 0   Review of Systems:   Review of Systems  HENT: Negative for postnasal drip.   Respiratory: Positive for cough. Negative for shortness of breath and wheezing.   Neurological: Negative for headaches.    Vitals:   Vitals:   10/06/18 1154  BP: 100/70  Pulse: 75  Temp: 97.7 F (36.5 C)  TempSrc: Oral  SpO2: 96%  Weight: 193 lb (87.5 kg)  Height: 5\' 10"  (1.778 m)     Body mass index is 27.69 kg/m.  Physical Exam:   Physical Exam  Constitutional: He appears well-developed. He is cooperative.  Non-toxic appearance. He does not have a sickly appearance. He does not appear ill. No distress.  HENT:  Head: Normocephalic and atraumatic.  Right Ear: Tympanic membrane, external ear and ear canal normal. Tympanic membrane is not erythematous, not retracted and not bulging.  Left Ear: Tympanic membrane, external ear and ear canal normal. Tympanic membrane is not erythematous, not retracted and not bulging.  Nose: Nose normal. Right sinus exhibits no maxillary sinus tenderness and no frontal  sinus tenderness. Left sinus exhibits no maxillary sinus tenderness and no frontal sinus tenderness.  Mouth/Throat: Uvula is midline and mucous membranes are normal. Posterior oropharyngeal erythema present. No posterior oropharyngeal edema. Tonsils are 1+ on the right. Tonsils are 1+ on the left. No tonsillar exudate.  Eyes: Conjunctivae and lids are normal.  Neck: Trachea normal.  Cardiovascular: Normal rate, regular rhythm, S1 normal, S2 normal,  normal heart sounds and normal pulses.  No LE edema  Pulmonary/Chest: Effort normal and breath sounds normal. He has no decreased breath sounds. He has no wheezes. He has no rhonchi. He has no rales.  Abdominal: Normal appearance and bowel sounds are normal. There is no tenderness.  Lymphadenopathy:    He has no cervical adenopathy.  Neurological: He is alert. GCS eye subscore is 4. GCS verbal subscore is 5. GCS motor subscore is 6.  Skin: Skin is warm, dry and intact.  Psychiatric: He has a normal mood and affect. His speech is normal and behavior is normal.  Nursing note and vitals reviewed.   Assessment and Plan:    Shivank was seen today for cough.  Diagnoses and all orders for this visit:  Cough No red flags on exam. Reassuring work-up in the ER. Reviewed labs and CXR results with patient. No elevation in WBC. I would like to do a trial of pepcid to see if this helps with symptoms. He may have early rhinovirus and we entertained a safety antibiotic should his cough worsen or develop significant congestion, however given his anxiety and his recent heart issues, I recommended that if his symptoms change he should make an appointment for further work-up. Also, if symptoms persist despite treatment with pepcid, I also recommended follow-up. Discussed resting and hydrating well over the weekend. Reviewed return precautions including worsening fever, SOB, worsening cough or other concerns. Push fluids and rest. I recommend that patient follow-up if  symptoms worsen or persist despite treatment x 7-10 days, sooner if needed.  Other orders -     famotidine (PEPCID) 10 MG tablet; Take 1 tablet (10 mg total) by mouth 2 (two) times daily.  . Reviewed expectations re: course of current medical issues. . Discussed self-management of symptoms. . Outlined signs and symptoms indicating need for more acute intervention. . Patient verbalized understanding and all questions were answered. . See orders for this visit as documented in the electronic medical record. . Patient received an After-Visit Summary.  CMA or LPN served as scribe during this visit. History, Physical, and Plan performed by medical provider. The above documentation has been reviewed and is accurate and complete.  Inda Coke, PA-C

## 2018-10-06 NOTE — Telephone Encounter (Signed)
[facility] as RX needs to go to specialty per insurance. Will send to [facility] as I am fairly confident it will need to go to their specialty pharmacy.   Will try them later today to see if able to provide copayment amount.

## 2018-10-06 NOTE — ED Provider Notes (Signed)
[facility] EMERGENCY DEPARTMENT Provider Note   CSN: [id number] Arrival date & time: 10/05/18  2350     History   Chief Complaint Chief Complaint  Patient presents with  . Chest Pain    HPI Ronnie Vazquez is a 72 y.o. male.  Patient with previous history of coronary artery disease, status post complicated stenting of the LAD lesion at a bifurcation on August 29, 2018 presents to the ER for evaluation of chest discomfort.  Patient reports that he has been feeling a heaviness in his chest over the last 4 days.  This is different than the exertional symptoms that he was having prior to his stenting.  He thought maybe he had a cold or an allergy because he had a slight nonproductive cough.  Tonight he has been experiencing intermittent episodes of discomfort which she describes as a heaviness that occur intermittently without precipitant.  Symptom only lasts briefly and then resolves.  No associated shortness of breath, nausea.  He does report that over the last 4 days he has been having intermittent episodes of flushing but not diaphoresis.     Past Medical History:  Diagnosis Date  . Chicken pox    hx  . Coronary artery disease    08/29/18 PCI/DES with bifurcation from LAD into 1st diag.   Marland Kitchen Hyperlipemia 02/02/2013  . Hyperlipidemia   . Left shoulder pain 02/02/2013  . Microscopic hematuria 02/02/2013  . Vision loss of left eye     Patient Active Problem List   Diagnosis Date Noted  . Coronary artery disease with exertional angina ([place]) 08/15/2018  . Abnormal stress ECG   . Chest pain 08/01/2018  . Carotid artery disease ([place]) 04/05/2016  . TIA (transient ischemic attack) 12/01/2015  . Left shoulder pain 02/02/2013  . Hyperlipemia 02/02/2013  . Microscopic hematuria, evaluated by urology 02/2013 02/02/2013    Past Surgical History:  Procedure Laterality Date  . COLONOSCOPY    . CORONARY BALLOON ANGIOPLASTY N/A 08/29/2018   Procedure: CORONARY BALLOON  ANGIOPLASTY;  Surgeon: Sherren Mocha, MD;  Location: [facility];  Service: Cardiovascular;  Laterality: N/A;  . CORONARY STENT INTERVENTION N/A 08/29/2018   Procedure: CORONARY STENT INTERVENTION;  Surgeon: Sherren Mocha, MD;  Location: [facility];  Service: Cardiovascular;  Laterality: N/A;  . LEFT HEART CATH AND CORONARY ANGIOGRAPHY N/A 08/08/2018   Procedure: LEFT HEART CATH AND CORONARY ANGIOGRAPHY;  Surgeon: Lorretta Harp, MD;  Location: [facility];  Service: Cardiovascular;  Laterality: N/A;  . [place] Medications    Prior to Admission medications   Medication Sig Start Date End Date Taking? Authorizing Provider  acetaminophen (TYLENOL) 325 MG tablet Take 650 mg by mouth at bedtime as needed (for sleep).     [provider]  aspirin 81 MG tablet Take 81 mg by mouth daily.    [provider]  cetirizine (ZYRTEC) 10 MG tablet Take 10 mg by mouth daily as needed for allergies.    [provider]  clopidogrel (PLAVIX) 75 MG tablet Take 1 tablet (75 mg total) by mouth daily. 08/21/18   Sherren Mocha, MD  nitroGLYCERIN (NITROSTAT) 0.4 MG SL tablet Place 1 tablet (0.4 mg total) under the tongue every 5 (five) minutes as needed for chest pain. 08/07/18 11/05/18  Lendon Colonel, NP    Family History Family History  Problem Relation Age of Onset  . Prostate  cancer Father   . Colon cancer Neg Hx   . Esophageal cancer Neg Hx   . Pancreatic cancer Neg Hx   . Rectal cancer Neg Hx   . Stomach cancer Neg Hx   . Heart disease Neg Hx     Social History Social History   Tobacco Use  . Smoking status: Never Smoker  . Smokeless tobacco: Never Used  Substance Use Topics  . Alcohol use: Yes    Alcohol/week: 2.0 standard drinks    Types: 2 Glasses of wine per week  . Drug use: No     Allergies   Patient has no known allergies.   Review of Systems Review of Systems    Cardiovascular: Positive for chest pain.  All other systems reviewed and are negative.    Physical Exam Updated Vital Signs BP 109/74   Pulse (!) 57   Temp 97.8 F (36.6 C)   Resp 11   Ht 5\' 10"  (1.778 m)   Wt 85.3 kg   SpO2 96%   BMI 26.98 kg/m   Physical Exam  Constitutional: He is oriented to person, place, and time. He appears well-developed and well-nourished. No distress.  HENT:  Head: Normocephalic and atraumatic.  Right Ear: Hearing normal.  Left Ear: Hearing normal.  Nose: Nose normal.  Mouth/Throat: Oropharynx is clear and moist and mucous membranes are normal.  Eyes: Pupils are equal, round, and reactive to light. Conjunctivae and EOM are normal.  Neck: Normal range of motion. Neck supple.  Cardiovascular: Regular rhythm, S1 normal and S2 normal. Exam reveals no gallop and no friction rub.  No murmur heard. Pulmonary/Chest: Effort normal and breath sounds normal. No respiratory distress. He exhibits no tenderness.  Abdominal: Soft. Normal appearance and bowel sounds are normal. There is no hepatosplenomegaly. There is no tenderness. There is no rebound, no guarding, no tenderness at McBurney's point and negative Murphy's sign. No hernia.  Musculoskeletal: Normal range of motion.  Neurological: He is alert and oriented to person, place, and time. He has normal strength. No cranial nerve deficit or sensory deficit. Coordination normal. GCS eye subscore is 4. GCS verbal subscore is 5. GCS motor subscore is 6.  Skin: Skin is warm, dry and intact. No rash noted. No cyanosis.  Psychiatric: He has a normal mood and affect. His speech is normal and behavior is normal. Thought content normal.  Nursing note and vitals reviewed.    ED Treatments / Results  Labs (all labs ordered are listed, but only abnormal results are displayed) Labs Reviewed  BASIC METABOLIC PANEL - Abnormal; Notable for the following components:      Result Value   BUN 24 (*)    Creatinine, Ser  1.28 (*)    GFR calc non Af Amer 55 (*)    All other components within normal limits  CBC  I-STAT TROPONIN, ED  I-STAT TROPONIN, ED    EKG EKG Interpretation  Date/Time:  Friday October 06 2018 00:00:18 EDT Ventricular Rate:  73 PR Interval:  168 QRS Duration: 94 QT Interval:  402 QTC Calculation: 442 R Axis:   20 Text Interpretation:  Normal sinus rhythm Normal ECG Confirmed by Orpah Greek [phone]) on 10/06/2018 1:55:50 AM   Radiology Dg Chest 2 View  Result Date: 10/06/2018 CLINICAL DATA:  Pt here for central chest pain that increases with deep breathing. Having more and more pressure over the last 3 days. Had stent placed here by MD Burt Knack done 5 weeks ago. EXAM: CHEST -  2 VIEW COMPARISON:  08/10/2018 FINDINGS: Coronary stent. Heart size within normal limits. The lungs appear clear. No pleural effusion. No significant bony abnormality observed. IMPRESSION: 1.  No active cardiopulmonary disease is radiographically apparent. 2. Coronary stent noted. Electronically Signed   By: Van Clines M.D.   On: 10/06/2018 01:08    Procedures Procedures (including critical care time)  Medications Ordered in ED Medications - No data to display  Prior CV studies:   The following studies were reviewed today:  Percutaneous coronary intervention 08/29/2018 Successful bifurcation PCI with stenting of the proximal LAD using a 3.0x30 mm Resolute Onyx DES and angioplasty of the first diagonal (through the LAD stent struts) with a 3.0 mm balloon         Pre CABG Dopplers 08/10/18 Final Interpretation: Right Carotid: Velocities in the right ICA are consistent with a 1-39% stenosis. Left Carotid: Velocities in the left ICA are consistent with a 1-39% stenosis. Right ABI: Pedal artery waveform within normal limits. Left ABI: Pedal artery waveform within normal limits.  Cardiac Catheterization 08/08/18  Prox LAD-1 lesion is 90% stenosed.  Prox LAD-2 lesion is 70%  stenosed.  The left ventricular systolic function is normal.  LV end diastolic pressure is normal.  The left ventricular ejection fraction is 55-65% by visual estimate.  Nuclear stress test 08/04/18  The left ventricular ejection fraction is mildly decreased (45-54%).  Nuclear stress EF: 54%.  ST segment depression was noted during stress in the V5, V6, II, III, aVF and I leads.  Blood pressure demonstrated a hypotensive response to exercise.  Defect 1: There is a large defect of severe severity present in the mid anterior, mid anteroseptal, apical anterior, apical septal and apex location.  Findings consistent with ischemia.  This is a high risk study.   Initial Impression / Assessment and Plan / ED Course  I have reviewed the triage vital signs and the nursing notes.  Pertinent labs & imaging results that were available during my care of the patient were reviewed by me and considered in my medical decision making (see chart for details).     Patient presents to the emergency department for evaluation of chest pain.  Patient recently underwent stenting of his LAD after experiencing exertional chest pain.  The pain he is experiencing tonight is different than what he has had with his CAD.  His EKG is normal.  Initial troponin was negative.  Vital signs are all stable.  Based on the patient's recent history, cardiology was consulted.  Patient has been seen by cardiology here in the ER and felt that he could have a second troponin, discharge if negative.  Second troponin has been performed and is negative.  Patient will therefore be discharged and is to follow-up with his cardiologist in the office, return if symptoms worsen.  Final Clinical Impressions(s) / ED Diagnoses   Final diagnoses:  Chest pain, unspecified type    ED Discharge Orders    None       Orpah Greek, MD 10/06/18 [phone]

## 2018-10-06 NOTE — Consult Note (Signed)
Cardiology Moonlighter Note  Paged by ED requesting consult regarding patient with chest pain.  In brief, patient is a 72 year old male with a history of hyperlipidemia, TIA, anomalous circumflex artery, and coronary artery disease who underwent PCI of the proximal LAD 5 weeks ago for exertional chest discomfort.  He has been taking his medications daily and his symptoms had resolved after his stent placement.  The patient now presents to the emergency department describing a single, brief episode of epigastric discomfort at home today.  This occurred with rest and was not at all associated with exertion or changes in position.  He notes that this symptom was different than the chest discomfort that led to him undergoing catheterization several weeks back.  His current symptom lasted only a few seconds and resolved spontaneously without any intervention.  This discomfort was nonradiating and there were no other associated symptoms.  In the ED, the patient's vital signs were all within normal limits.  His initial ECG was completely normal with no evidence of ST or T wave changes.  His initial troponin was undetectable.  Given the atypical nature of the patient's symptoms and the fact that his current symptoms are different than his previous angina, it seems unlikely that his current symptoms are attributable to cardiac ischemia.  The patient will have a second troponin drawn very shortly.  As long as this troponin is negative and the patient does not have any recurrent chest discomfort while in the ED, it would be reasonable for him to be discharged home tonight.  The patient has follow-up in place with his cardiologist, Dr. Burt Knack.  The patient understands that if he develops recurrent symptoms of chest discomfort or any new symptoms, he should return to the ED for evaluation.  A copy of this note will be sent to Dr. Burt Knack.  Marcie Mowers, MD Cardiology Fellow, PGY-6

## 2018-10-06 NOTE — Patient Instructions (Signed)
It was great to see you!  Start 10 mg pepcid twice daily. Take on an empty stomach.  Given your history, should your symptoms change or not improve, I want you to be re-evaluated by your PCP or other appropriate provider.  Take care,  Inda Coke PA-C

## 2018-10-09 NOTE — Telephone Encounter (Signed)
Pt presented on Friday to make appt with PA and while in office he filled out patient assistance papwerwork in case needed due to cost.

## 2018-10-10 ENCOUNTER — Ambulatory Visit (INDEPENDENT_AMBULATORY_CARE_PROVIDER_SITE_OTHER): Payer: Medicare Other | Admitting: Physician Assistant

## 2018-10-10 ENCOUNTER — Encounter: Payer: Self-pay | Admitting: Physician Assistant

## 2018-10-10 VITALS — BP 104/74 | HR 80 | Ht 70.0 in | Wt 193.0 lb

## 2018-10-10 DIAGNOSIS — Z955 Presence of coronary angioplasty implant and graft: Secondary | ICD-10-CM | POA: Diagnosis not present

## 2018-10-10 DIAGNOSIS — I2510 Atherosclerotic heart disease of native coronary artery without angina pectoris: Secondary | ICD-10-CM

## 2018-10-10 DIAGNOSIS — E7800 Pure hypercholesterolemia, unspecified: Secondary | ICD-10-CM

## 2018-10-10 NOTE — Progress Notes (Signed)
Hannah R Kim, DO  

## 2018-10-10 NOTE — Telephone Encounter (Signed)
Patient was seen today in clinic

## 2018-10-10 NOTE — Patient Instructions (Addendum)
Your physician recommends that you continue on your current medications as directed. Please refer to the Current Medication list given to you today.   Your physician recommends that you schedule a follow-up appointment in:  [place]

## 2018-10-10 NOTE — Progress Notes (Signed)
Cardiology Office Note    Date:  10/10/2018   ID:  OLSEN Vazquez, DOB 1946-07-30, MRN [id number]  PCP:  Ronnie Kern, DO  Cardiologist:  Dr. Burt Knack  Chief Complaint: ER follow up  History of Present Illness:   Ronnie Vazquez is a 72 y.o. male with hx of CAD, HTN and HLD presents for follow up.   He underwent cardiac catheterization in August 2019 which demonstrated complex bifurcational disease of the proximal LAD/first diagonal with associated CCS class II angina.  Coronary artery bypass grafting was initially recommended.  However, the patient preferred proceeding with percutaneous coronary intervention.  He was seen by Dr. Burt Knack 08/21/2018.  It was felt that PCI would be fairly complex placing him at higher risk of periprocedural myocardial infarction.  The patient accepted this risk and was willing to proceed.  The patient underwent successful bifurcation PCI with DES to the proximal LAD and angioplasty of the first diagonal through the LAD stent struts on 08/29/2018.  Post PCI course was uneventful.  Seen in ER 10/06/2018 for chest pain which was different than recent anginal pain leading to complex PCI. Ruled out and discharge.  Symptoms resolved since on Pepcid  Here today for follow. The patient denies nausea, vomiting, fever, chest pain, palpitations, shortness of breath, orthopnea, PND, dizziness, syncope, cough, congestion, abdominal pain, hematochezia, melena, lower extremity edema. Currently walks about 45 minutes everyday without any limitations.   Past Medical History:  Diagnosis Date  . Chicken pox    hx  . Coronary artery disease    08/29/18 PCI/DES with bifurcation from LAD into 1st diag.   Marland Kitchen Hyperlipemia 02/02/2013  . Hyperlipidemia   . Left shoulder pain 02/02/2013  . Microscopic hematuria 02/02/2013  . Vision loss of left eye     Past Surgical History:  Procedure Laterality Date  . COLONOSCOPY    . CORONARY BALLOON ANGIOPLASTY N/A 08/29/2018   Procedure: CORONARY  BALLOON ANGIOPLASTY;  Surgeon: Sherren Mocha, MD;  Location: [facility];  Service: Cardiovascular;  Laterality: N/A;  . CORONARY STENT INTERVENTION N/A 08/29/2018   Procedure: CORONARY STENT INTERVENTION;  Surgeon: Sherren Mocha, MD;  Location: [facility];  Service: Cardiovascular;  Laterality: N/A;  . LEFT HEART CATH AND CORONARY ANGIOGRAPHY N/A 08/08/2018   Procedure: LEFT HEART CATH AND CORONARY ANGIOGRAPHY;  Surgeon: Lorretta Harp, MD;  Location: [facility];  Service: Cardiovascular;  Laterality: N/A;  . TONSILLECTOMY AND ADENOIDECTOMY  1959    Current Medications: Prior to Admission medications   Medication Sig Start Date End Date Taking? Authorizing Provider  acetaminophen (TYLENOL) 325 MG tablet Take 650 mg by mouth at bedtime as needed (for sleep).     [provider]  aspirin 81 MG tablet Take 81 mg by mouth daily.    [provider]  cetirizine (ZYRTEC) 10 MG tablet Take 10 mg by mouth daily as needed for allergies.    [provider]  clopidogrel (PLAVIX) 75 MG tablet Take 1 tablet (75 mg total) by mouth daily. 08/21/18   Sherren Mocha, MD  Evolocumab (REPATHA SURECLICK) 517 MG/ML SOAJ Inject 140 mg into the skin every 14 (fourteen) days. 10/06/18   Sherren Mocha, MD  famotidine (PEPCID) 10 MG tablet Take 1 tablet (10 mg total) by mouth 2 (two) times daily. 10/06/18   Inda Coke, PA  nitroGLYCERIN (NITROSTAT) 0.4 MG SL tablet Place 1 tablet (0.4 mg total) under the tongue every 5 (five) minutes as needed  for chest pain. 08/07/18 11/05/18  Lendon Colonel, NP    Allergies:   Patient has no known allergies.   Social History   Socioeconomic History  . Marital status: Married    Spouse name: Not on file  . Number of children: Not on file  . Years of education: Not on file  . Highest education level: Not on file  Occupational History  . Not on file  Social Needs  . Financial resource strain: Not on file  .  Food insecurity:    Worry: Not on file    Inability: Not on file  . Transportation needs:    Medical: Not on file    Non-medical: Not on file  Tobacco Use  . Smoking status: Never Smoker  . Smokeless tobacco: Never Used  Substance and Sexual Activity  . Alcohol use: Yes    Alcohol/week: 2.0 standard drinks    Types: 2 Glasses of wine per week  . Drug use: No  . Sexual activity: Not on file  Lifestyle  . Physical activity:    Days per week: Not on file    Minutes per session: Not on file  . Stress: Not on file  Relationships  . Social connections:    Talks on phone: Not on file    Gets together: Not on file    Attends religious service: Not on file    Active member of club or organization: Not on file    Attends meetings of clubs or organizations: Not on file    Relationship status: Not on file  Other Topics Concern  . 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   ROS:   Please see the history of present illness.    ROS All other systems reviewed and are negative.   PHYSICAL EXAM:   VS:  BP 104/74   Pulse 80   Ht 5\' 10"  (1.778 m)   Wt 193 lb (87.5 kg)   SpO2 98%   BMI 27.69 kg/m    GEN: Well nourished, well developed, in no acute distress  HEENT: normal  Neck: no JVD, carotid bruits, or masses Cardiac: RRR; no murmurs, rubs, or gallops,no edema  Respiratory:  clear to auscultation bilaterally, normal work of breathing GI: soft, nontender, nondistended, + BS MS: no deformity or atrophy  Skin: warm and dry, no rash Neuro:  Alert and Oriented x 3, Strength and sensation are intact Psych: euthymic mood, full affect  Wt Readings from Last 3 Encounters:  10/10/18 193 lb (87.5 kg)    10/06/18 193 lb (87.5 kg)  10/06/18 188 lb (85.3 kg)      Studies/Labs Reviewed:   EKG:  EKG is not ordered today.    Recent Labs: 09/29/2018: ALT 21 10/06/2018: BUN 24; Creatinine, Ser 1.28; Hemoglobin 14.5; Platelets 209; Potassium 4.1; Sodium 139   Lipid Panel    Component Value Date/Time   CHOL 145 09/29/2018 0000   TRIG 157 (H) 09/29/2018 0000   HDL 39 (L) 09/29/2018 0000   CHOLHDL 3.7 09/29/2018 0000   CHOLHDL 5 08/12/2017 1191  VLDL 60.2 (H) 08/12/2017 0921   LDLCALC 75 09/29/2018 0000   LDLDIRECT 117.0 08/12/2017 0921    Additional studies/ records that were reviewed today include:   Percutaneous coronary intervention 08/29/2018 Successful bifurcation PCI with stenting of the proximal LAD using a 3.0x30 mm Resolute Onyx DES and angioplasty of the first diagonal (through the LAD stent struts) with a 3.0 mm balloon         Pre CABG Dopplers 08/10/18 Final Interpretation: Right Carotid: Velocities in the right ICA are consistent with a 1-39% stenosis. Left Carotid: Velocities in the left ICA are consistent with a 1-39% stenosis. Right ABI: Pedal artery waveform within normal limits. Left ABI: Pedal artery waveform within normal limits.  Cardiac Catheterization 08/08/18  Prox LAD-1 lesion is 90% stenosed.  Prox LAD-2 lesion is 70% stenosed.  The left ventricular systolic function is normal.  LV end diastolic pressure is normal.  The left ventricular ejection fraction is 55-65% by visual estimate.  Nuclear stress test 08/04/18  The left ventricular ejection fraction is mildly decreased (45-54%).  Nuclear stress EF: 54%.  ST segment depression was noted during stress in the V5, V6, II, III, aVF and I leads.  Blood pressure demonstrated a hypotensive response to exercise.  Defect 1: There is a large defect of severe severity present in the mid anterior, mid anteroseptal, apical anterior, apical septal and apex location.  Findings consistent with  ischemia.  This is a high risk study.  Echo 08/13/2013 Moderate LVH, EF 65    ASSESSMENT & PLAN:    1. CAD Status post recent complex LAD/diagonal PCI with DES to the LAD and angioplasty to the diagonal through the strut of the stent No angina. Continue ASA and Plavix. Will start Rehab in 1-2 weeks.   2. HLD - statin intolerance. Waiting Repatha approval.     Medication Adjustments/Labs and Tests Ordered: Current medicines are reviewed at length with the patient today.  Concerns regarding medicines are outlined above.  Medication changes, Labs and Tests ordered today are listed in the Patient Instructions below. Patient Instructions  Your physician recommends that you continue on your current medications as directed. Please refer to the Current Medication list given to you today.   Your physician recommends that you schedule a follow-up appointment in:  AS SCHEDULED WITH DR Larry Sierras Alexander, [place]  10/10/2018 1:05 PM    [facility] [address], [place], [place]  [address] Phone: [phone]; Fax: [phone]

## 2018-10-10 NOTE — Telephone Encounter (Signed)
Thank you agree with PCP evaluation

## 2018-10-11 ENCOUNTER — Telehealth (HOSPITAL_COMMUNITY): Payer: Self-pay | Admitting: Pharmacist

## 2018-10-11 NOTE — Telephone Encounter (Signed)
Submitted Safety Net Application, however listed income is over threshold for usual coverage and pt will likely be denied. Called specialty pharmacy and copay is > $350 per month.

## 2018-10-16 NOTE — Progress Notes (Signed)
MARKEVIUS TROMBETTA 72 y.o. male DOB 01-Jan-1946 MRN [id number]       Nutrition No diagnosis found. Past Medical History:  Diagnosis Date  . Chicken pox    hx  . Coronary artery disease    08/29/18 PCI/DES with bifurcation from LAD into 1st diag.   Marland Kitchen Hyperlipemia 02/02/2013  . Hyperlipidemia   . Left shoulder pain 02/02/2013  . Microscopic hematuria 02/02/2013  . Vision loss of left eye    Meds reviewed.    Current Outpatient Medications (Cardiovascular):  Marland Kitchen  Evolocumab (REPATHA SURECLICK) 119 MG/ML SOAJ, Inject 140 mg into the skin every 14 (fourteen) days. .  nitroGLYCERIN (NITROSTAT) 0.4 MG SL tablet, Place 1 tablet (0.4 mg total) under the tongue every 5 (five) minutes as needed for chest pain.  Current Outpatient Medications (Respiratory):  .  cetirizine (ZYRTEC) 10 MG tablet, Take 10 mg by mouth daily as needed for allergies.  Current Outpatient Medications (Analgesics):  .  acetaminophen (TYLENOL) 325 MG tablet, Take 650 mg by mouth at bedtime as needed (for sleep).  Marland Kitchen  aspirin 81 MG tablet, Take 81 mg by mouth daily.  Current Outpatient Medications (Hematological):  .  clopidogrel (PLAVIX) 75 MG tablet, Take 1 tablet (75 mg total) by mouth daily.  Current Outpatient Medications (Other):  .  famotidine (PEPCID) 10 MG tablet, Take 1 tablet (10 mg total) by mouth 2 (two) times daily.   HT: Ht Readings from Last 1 Encounters:  10/10/18 5\' 10"  (1.778 m)    WT: Wt Readings from Last 5 Encounters:  10/10/18 193 lb (87.5 kg)  10/06/18 193 lb (87.5 kg)  10/06/18 188 lb (85.3 kg)  09/11/18 199 lb 12.8 oz (90.6 kg)  08/29/18 195 lb (88.5 kg)     There is no height or weight on file to calculate BMI.   Current tobacco use? No       Labs:  Lipid Panel     Component Value Date/Time   CHOL 145 09/29/2018 0000   TRIG 157 (H) 09/29/2018 0000   HDL 39 (L) 09/29/2018 0000   CHOLHDL 3.7 09/29/2018 0000   CHOLHDL 5 08/12/2017 0921   VLDL 60.2 (H) 08/12/2017 0921   LDLCALC 75  09/29/2018 0000   LDLDIRECT 117.0 08/12/2017 0921    Lab Results  Component Value Date   HGBA1C 5.4 08/10/2018   CBG (last 3)  No results for input(s): GLUCAP in the last 72 hours.  Nutrition Diagnosis ? Food-and nutrition-related knowledge deficit related to lack of exposure to information as related to diagnosis of: ? CVD  ? Overweight  related to excessive energy intake as evidenced by a BMI of 27.69 kg/m2  Nutrition Goal(s):  ? To be determined  Plan:  Pt to attend nutrition classes ? Nutrition I ? Nutrition II ? Portion Distortion  ? Diabetes Blitz ? Diabetes Q & A Will provide client-centered nutrition education as part of interdisciplinary care.   Monitor and evaluate progress toward nutrition goal with team.  Laurina Bustle, MS, RD, LDN 10/16/2018 8:33 AM

## 2018-10-17 ENCOUNTER — Encounter (HOSPITAL_COMMUNITY): Payer: Self-pay

## 2018-10-17 ENCOUNTER — Ambulatory Visit (HOSPITAL_COMMUNITY): Payer: Medicare Other

## 2018-10-17 ENCOUNTER — Encounter (HOSPITAL_COMMUNITY)
Admission: RE | Admit: 2018-10-17 | Discharge: 2018-10-17 | Disposition: A | Discharge status: Home / Self Care | Payer: Medicare Other | Source: Ambulatory Visit | Attending: Cardiovascular Disease | Admitting: Cardiovascular Disease

## 2018-10-17 VITALS — Ht 69.25 in | Wt 191.4 lb

## 2018-10-17 DIAGNOSIS — E785 Hyperlipidemia, unspecified: Secondary | ICD-10-CM | POA: Diagnosis not present

## 2018-10-17 DIAGNOSIS — Z79899 Other long term (current) drug therapy: Secondary | ICD-10-CM | POA: Diagnosis not present

## 2018-10-17 DIAGNOSIS — Z955 Presence of coronary angioplasty implant and graft: Secondary | ICD-10-CM | POA: Insufficient documentation

## 2018-10-17 NOTE — Progress Notes (Signed)
Ronnie Vazquez 72 y.o. male DOB: 02-26-1946 MRN: [id number]      Nutrition Note  1. 08/29/2018 Stented coronary artery    Past Medical History:  Diagnosis Date  . Chicken pox    hx  . Coronary artery disease    08/29/18 PCI/DES with bifurcation from LAD into 1st diag.   Marland Kitchen Hyperlipemia 02/02/2013  . Hyperlipidemia   . Left shoulder pain 02/02/2013  . Microscopic hematuria 02/02/2013  . Vision loss of left eye    Meds reviewed.    Current Outpatient Medications (Cardiovascular):  Marland Kitchen  Evolocumab (REPATHA SURECLICK) 353 MG/ML SOAJ, Inject 140 mg into the skin every 14 (fourteen) days. .  nitroGLYCERIN (NITROSTAT) 0.4 MG SL tablet, Place 1 tablet (0.4 mg total) under the tongue every 5 (five) minutes as needed for chest pain.  Current Outpatient Medications (Respiratory):  .  cetirizine (ZYRTEC) 10 MG tablet, Take 10 mg by mouth daily as needed for allergies.  Current Outpatient Medications (Analgesics):  .  acetaminophen (TYLENOL) 325 MG tablet, Take 650 mg by mouth at bedtime as needed (for sleep).  Marland Kitchen  aspirin 81 MG tablet, Take 81 mg by mouth daily.  Current Outpatient Medications (Hematological):  .  clopidogrel (PLAVIX) 75 MG tablet, Take 1 tablet (75 mg total) by mouth daily.  Current Outpatient Medications (Other):  .  famotidine (PEPCID) 10 MG tablet, Take 1 tablet (10 mg total) by mouth 2 (two) times daily.   HT: Ht Readings from Last 1 Encounters:  10/17/18 5' 9.25" (1.759 m)    WT: Wt Readings from Last 5 Encounters:  10/17/18 191 lb 5.8 oz (86.8 kg)  10/10/18 193 lb (87.5 kg)  10/06/18 193 lb (87.5 kg)  10/06/18 188 lb (85.3 kg)  09/11/18 199 lb 12.8 oz (90.6 kg)     Body mass index is 28.06 kg/m.   Current tobacco use? No  Labs:  Lipid Panel     Component Value Date/Time   CHOL 145 09/29/2018 0000   TRIG 157 (H) 09/29/2018 0000   HDL 39 (L) 09/29/2018 0000   CHOLHDL 3.7 09/29/2018 0000   CHOLHDL 5 08/12/2017 0921   VLDL 60.2 (H) 08/12/2017 0921   LDLCALC  75 09/29/2018 0000   LDLDIRECT 117.0 08/12/2017 0921    Lab Results  Component Value Date   HGBA1C 5.4 08/10/2018   CBG (last 3)  No results for input(s): GLUCAP in the last 72 hours.  Nutrition Note Spoke with pt. Nutrition plan and goals reviewed with pt. Pt is following Step 2 of the Therapeutic Lifestyle Changes diet. Pt wants to lose wt. Pt has been trying to lose wt by avoiding fried foods, avoiding fatty cuts of red meat. Wt loss tips reviewed (label reading, how to build a healthy plate, portion sizes, eating frequently across the day). Pt shares that he works in [employer]. Tues, Weds, Thursday every week. When he is gone for these 3 days eats out 3x a day. Per discussion, pt does not not use canned/convenience foods often. Pt does not add salt to food. Pt does eat out frequently. Pt expressed understanding of the information reviewed. Pt aware of nutrition education classes offered and would not like to attend nutrition classes.  Nutrition Diagnosis ? Food-and nutrition-related knowledge deficit related to lack of exposure to information as related to diagnosis of: ? CVD  ? Overweight  related to excessive energy intake as evidenced by a Body mass index is 28.06 kg/m.  Nutrition Intervention ? Pt's individual nutrition  plan and goals reviewed with pt. ? Pt given handouts for: ? Nutrition I class ? Nutrition II class   Nutrition Goal(s):   ? Pt to identify and limit food sources of saturated fat, trans fat, refined carbohydrates and sodium ? Pt to identify food quantities necessary to achieve weight loss of 6-24 lbs. at graduation from cardiac rehab. Goal wt of less than 180 lb desired, but pt shared he would not like to lose weight at the cost of his muscle composition.  Plan:  ? Pt to attend nutrition classes ? Nutrition I ? Nutrition II ? Portion Distortion  ? Will provide client-centered nutrition education as part of interdisciplinary care ? Monitor and evaluate  progress toward nutrition goal with team.   Laurina Bustle, MS, RD, LDN 10/17/2018 9:09 AM

## 2018-10-17 NOTE — Progress Notes (Addendum)
Cardiac Individual Treatment Plan  Patient Details  Name: Ronnie Vazquez MRN: [id number] Date of Birth: July 08, 1946 Referring Provider:   Flowsheet Row CARDIAC REHAB PHASE II ORIENTATION from 10/17/2018 in [place]  Referring Provider  Sherren Mocha, MD      Initial Encounter Date:  [place] PHASE II ORIENTATION from 10/17/2018 in [facility]  Date  10/17/18      Visit Diagnosis: 08/29/2018 Stented coronary artery  Patient's Home Medications on Admission:  Current Outpatient Medications:  .  acetaminophen (TYLENOL) 325 MG tablet, Take 650 mg by mouth at bedtime as needed (for sleep). , Disp: , Rfl:  .  aspirin 81 MG tablet, Take 81 mg by mouth daily., Disp: , Rfl:  .  cetirizine (ZYRTEC) 10 MG tablet, Take 10 mg by mouth daily as needed for allergies., Disp: , Rfl:  .  clopidogrel (PLAVIX) 75 MG tablet, Take 1 tablet (75 mg total) by mouth daily., Disp: 90 tablet, Rfl: 3 .  famotidine (PEPCID) 10 MG tablet, Take 1 tablet (10 mg total) by mouth 2 (two) times daily., Disp: 60 tablet, Rfl: 0 .  nitroGLYCERIN (NITROSTAT) 0.4 MG SL tablet, Place 1 tablet (0.4 mg total) under the tongue every 5 (five) minutes as needed for chest pain., Disp: 25 tablet, Rfl: 3 .  Evolocumab (REPATHA SURECLICK) 287 MG/ML SOAJ, Inject 140 mg into the skin every 14 (fourteen) days. (Patient not taking: Reported on 10/17/2018), Disp: 2 pen, Rfl: 11  Past Medical History: Past Medical History:  Diagnosis Date  . Chicken pox    hx  . Coronary artery disease    08/29/18 PCI/DES with bifurcation from LAD into 1st diag.   Mar[facility] Kitchen Hyperlipemia 02/02/2013  . Hyperlipidemia   . Left shoulder pain 02/02/2013  . Microscopic hematuria 02/02/2013  . Vision loss of left eye     Tobacco Use: Social History   Tobacco Use  Smoking Status Never Smoker  Smokeless Tobacco Never Used    Labs: Recent Review Flowsheet Data    Labs for ITP  Cardiac and Pulmonary Rehab Latest Ref Rng & Units 12/01/2015 04/05/2016 08/12/2017 08/10/2018 09/29/2018   Cholestrol 100 - 199 mg/dL 233(H) 247(H) 224(H) - 145   LDLCALC 0 - 99 mg/dL - - - - 75   LDLDIRECT mg/dL 128.0 102.0 117.0 - -   HDL >39 mg/dL 41.30 40.20 42.00 - 39(L)   Trlycerides 0 - 149 mg/dL 259.0(H) 323.0(H) 301.0(H) - 157(H)   Hemoglobin A1c 4.8 - 5.6 % 5.5 - 5.5 5.4 -   PHART 7.350 - 7.450 - - - 7.472(H) -   PCO2ART 32.0 - 48.0 mmHg - - - 33.9 -   HCO3 20.0 - 28.0 mmol/L - - - 24.5 -   O2SAT % - - - 87.6 -      Capillary Blood Glucose: No results found for: GLUCAP   Exercise Target Goals: Exercise Program Goal: Individual exercise prescription set using results from initial 6 min walk test and THRR while considering  patient's activity barriers and safety.   Exercise Prescription Goal: Initial exercise prescription builds to 30-45 minutes a day of aerobic activity, 2-3 days per week.  Home exercise guidelines will be given to patient during program as part of exercise prescription that the participant will acknowledge.  Activity Barriers & Risk Stratification: Activity Barriers & Cardiac Risk Stratification - 10/17/18 0937    Activity Barriers & Cardiac Risk Stratification  Activity Barriers  Joint Problems   Left Shoulder Pain   Cardiac Risk Stratification  High           6 Minute Walk: 6 Minute Walk    6 Minute Walk    Row Name 10/17/18 0906   Phase  Initial   Distance  1826 feet   Walk Time  6 minutes   # of Rest Breaks  0   MPH  3.46   METS  3.67   RPE  13   Symptoms  No   Resting HR  79 bpm   Resting BP  94/60   Max Ex. HR  102 bpm   Max Ex. BP  102/62   2 Minute Post BP  92/60          Oxygen Initial Assessment:   Oxygen Re-Evaluation:   Oxygen Discharge (Final Oxygen Re-Evaluation):   Initial Exercise Prescription: Initial Exercise Prescription - 10/17/18 0900    Date of Initial Exercise RX and Referring Provider           Date  10/17/18    Referring Provider  Sherren Mocha, MD    Expected Discharge Date  01/26/18        Bike          Level  1.2    Minutes  10        NuStep          Level  3    SPM  85    Minutes  10    METs  3.6        Track          Laps  15    Minutes  10    METs  3.6        Prescription Details          Frequency (times per week)  3    Duration  Progress to 30 minutes of continuous aerobic without signs/symptoms of physical distress        Intensity          THRR 40-80% of Max Heartrate  60-121    Ratings of Perceived Exertion  11-13        Progression          Progression  Continue to progress workloads to maintain intensity without signs/symptoms of physical distress.        Resistance Training          Training Prescription  Yes    Weight  3 lbs    Reps  10-15           Perform Capillary Blood Glucose checks as needed.  Exercise Prescription Changes:   Exercise Comments:   Exercise Goals and Review: Exercise Goals    Exercise Goals    Row Name 10/17/18 1014   Increase Physical Activity  Yes   Intervention  Provide advice, education, support and counseling about physical activity/exercise needs.;Develop an individualized exercise prescription for aerobic and resistive training based on initial evaluation findings, risk stratification, comorbidities and participant's personal goals.   Expected Outcomes  Short Term: Attend rehab on a regular basis to increase amount of physical activity.   Increase Strength and Stamina  Yes   Intervention  Provide advice, education, support and counseling about physical activity/exercise needs.;Develop an individualized exercise prescription for aerobic and resistive training based on initial evaluation findings, risk stratification, comorbidities and participant's personal goals.   Expected Outcomes  Short Term: Increase workloads from initial exercise  prescription for resistance, speed, and METs.   Able  to understand and use rate of perceived exertion (RPE) scale  Yes   Intervention  Provide education and explanation on how to use RPE scale   Expected Outcomes  Short Term: Able to use RPE daily in rehab to express subjective intensity level;Long Term:  Able to use RPE to guide intensity level when exercising independently   Knowledge and understanding of Target Heart Rate Range (THRR)  Yes   Intervention  Provide education and explanation of THRR including how the numbers were predicted and where they are located for reference   Expected Outcomes  Short Term: Able to state/look up THRR;Long Term: Able to use THRR to govern intensity when exercising independently;Short Term: Able to use daily as guideline for intensity in rehab   Able to check pulse independently  Yes   Intervention  Provide education and demonstration on how to check pulse in carotid and radial arteries.;Review the importance of being able to check your own pulse for safety during independent exercise   Expected Outcomes  Short Term: Able to explain why pulse checking is important during independent exercise   Understanding of Exercise Prescription  Yes   Intervention  Provide education, explanation, and written materials on patient's individual exercise prescription   Expected Outcomes  Short Term: Able to explain program exercise prescription;Long Term: Able to explain home exercise prescription to exercise independently          Exercise Goals Re-Evaluation :   Discharge Exercise Prescription (Final Exercise Prescription Changes):   Nutrition:  Target Goals: Understanding of nutrition guidelines, daily intake of sodium 1500mg , cholesterol 200mg , calories 30% from fat and 7% or less from saturated fats, daily to have 5 or more servings of fruits and vegetables.  Biometrics: Pre Biometrics - 10/17/18 0908    Pre Biometrics          Height  5' 9.25" (1.759 m)    Weight  86.8 kg    Waist Circumference  40.5 inches     Hip Circumference  40 inches    Waist to Hip Ratio  1.01 %    BMI (Calculated)  28.05    Triceps Skinfold  28 mm    % Body Fat  30.4 %    Grip Strength  37 kg    Flexibility  8 in    Single Leg Stand  7 seconds            Nutrition Therapy Plan and Nutrition Goals: Nutrition Therapy & Goals - 10/17/18 0918    Nutrition Therapy          Diet  heart healthy        Personal Nutrition Goals          Nutrition Goal  Pt to identify and limit food sources of saturated fat, trans fat, refined carbohydrates and sodium    Personal Goal #2  pt to identify food quantities necessary to achieve weight loss of 6-24 lbs at graduation from cardiac rehab. Goal weight of less than 180 lbs desired        Intervention Plan          Intervention  Prescribe, educate and counsel regarding individualized specific dietary modifications aiming towards targeted core components such as weight, hypertension, lipid management, diabetes, heart failure and other comorbidities.    Expected Outcomes  Short Term Goal: Understand basic principles of dietary content, such as calories, fat, sodium, cholesterol and nutrients.;Long Term Goal: Adherence to  prescribed nutrition plan.           Nutrition Assessments: Nutrition Assessments - 10/17/18 0920    MEDFICTS Scores          Pre Score  18           Nutrition Goals Re-Evaluation:   Nutrition Goals Re-Evaluation:   Nutrition Goals Discharge (Final Nutrition Goals Re-Evaluation):   Psychosocial: Target Goals: Acknowledge presence or absence of significant depression and/or stress, maximize coping skills, provide positive support system. Participant is able to verbalize types and ability to use techniques and skills needed for reducing stress and depression.  Initial Review & Psychosocial Screening: Initial Psych Review & Screening - 10/17/18 0853    Initial Review          Current issues with  None Identified        Family Dynamics           Good Support System?  Yes        Barriers          Psychosocial barriers to participate in program  There are no identifiable barriers or psychosocial needs.        Screening Interventions          Interventions  Encouraged to exercise           Quality of Life Scores: Quality of Life - 10/17/18 0854    Quality of Life          Select  Quality of Life        Quality of Life Scores          Health/Function Pre  23.5 %    Socioeconomic Pre  26 %    Psych/Spiritual Pre  24.6 %    Family Pre  27.6 %    GLOBAL Pre  24.9 %          Scores of 19 and below usually indicate a poorer quality of life in these areas.  A difference of  2-3 points is a clinically meaningful difference.  A difference of 2-3 points in the total score of the Quality of Life Index has been associated with significant improvement in overall quality of life, self-image, physical symptoms, and general health in studies assessing change in quality of life.  PHQ-9: Recent Review Flowsheet Data    Depression screen [facility] 2/9 08/12/2017 12/01/2015   Decreased Interest 0 0   Down, Depressed, Hopeless 0 0   PHQ - 2 Score 0 0     Interpretation of Total Score  Total Score Depression Severity:  1-4 = Minimal depression, 5-9 = Mild depression, 10-14 = Moderate depression, 15-19 = Moderately severe depression, 20-27 = Severe depression   Psychosocial Evaluation and Intervention:   Psychosocial Re-Evaluation:   Psychosocial Discharge (Final Psychosocial Re-Evaluation):   Vocational Rehabilitation: Provide vocational rehab assistance to qualifying candidates.   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 Participant  will state understanding/return demonstration of topics presented.  Learning Barriers/Preferences: Learning Barriers/Preferences - 10/17/18 0941    Learning Barriers/Preferences          Learning Barriers  Sight   Loss of vision left eye  Learning Preferences  Written Material           Education Topics: Count Your Pulse:  -Group instruction provided by verbal instruction, demonstration, patient participation and written materials to support subject.  Instructors address importance of being able to find your pulse and how to count your pulse when at home without a heart monitor.  Patients get hands on experience counting their pulse with staff help and individually.   Heart Attack, Angina, and Risk Factor Modification:  -Group instruction provided by verbal instruction, video, and written materials to support subject.  Instructors address signs and symptoms of angina and heart attacks.    Also discuss risk factors for heart disease and how to make changes to improve heart health risk factors.   Functional Fitness:  -Group instruction provided by verbal instruction, demonstration, patient participation, and written materials to support subject.  Instructors address safety measures for doing things around the house.  Discuss how to get up and down off the floor, how to pick things up properly, how to safely get out of a chair without assistance, and balance training.   Meditation and Mindfulness:  -Group instruction provided by verbal instruction, patient participation, and written materials to support subject.  Instructor addresses importance of mindfulness and meditation practice to help reduce stress and improve awareness.  Instructor also leads participants through a meditation exercise.    Stretching for Flexibility and Mobility:  -Group instruction provided by verbal instruction, patient participation, and written materials to support subject.  Instructors lead participants  through series of stretches that are designed to increase flexibility thus improving mobility.  These stretches are additional exercise for major muscle groups that are typically performed during regular warm up and cool down.   Hands Only CPR:  -Group verbal, video, and participation provides a basic overview of AHA guidelines for community CPR. Role-play of emergencies allow participants the opportunity to practice calling for help and chest compression technique with discussion of AED use.   Hypertension: -Group verbal and written instruction that provides a basic overview of hypertension including the most recent diagnostic guidelines, risk factor reduction with self-care instructions and medication management.    Nutrition I class: Heart Healthy Eating:  -Group instruction provided by PowerPoint slides, verbal discussion, and written materials to support subject matter. The instructor gives an explanation and review of the Therapeutic Lifestyle Changes diet recommendations, which includes a discussion on lipid goals, dietary fat, sodium, fiber, plant stanol/sterol esters, sugar, and the components of a well-balanced, healthy diet.   Nutrition II class: Lifestyle Skills:  -Group instruction provided by PowerPoint slides, verbal discussion, and written materials to support subject matter. The instructor gives an explanation and review of label reading, grocery shopping for heart health, heart healthy recipe modifications, and ways to make healthier choices when eating out.   Diabetes Question & Answer:  -Group instruction provided by PowerPoint slides, verbal discussion, and written materials to support subject matter. The instructor gives an explanation and review of diabetes co-morbidities, pre- and post-prandial blood glucose goals, pre-exercise blood glucose goals, signs, symptoms, and treatment of hypoglycemia and hyperglycemia, and foot care basics.   Diabetes Blitz:  -Group  instruction provided by PowerPoint slides, verbal discussion, and written materials to support subject matter. The instructor gives an explanation and review of the physiology behind type 1 and type 2 diabetes, diabetes medications and rational behind using different medications, pre- and post-prandial blood glucose recommendations and Hemoglobin A1c goals, diabetes diet, and exercise including blood glucose guidelines  for exercising safely.    Portion Distortion:  -Group instruction provided by PowerPoint slides, verbal discussion, written materials, and food models to support subject matter. The instructor gives an explanation of serving size versus portion size, changes in portions sizes over the last 20 years, and what consists of a serving from each food group.   Stress Management:  -Group instruction provided by verbal instruction, video, and written materials to support subject matter.  Instructors review role of stress in heart disease and how to cope with stress positively.     Exercising on Your Own:  -Group instruction provided by verbal instruction, power point, and written materials to support subject.  Instructors discuss benefits of exercise, components of exercise, frequency and intensity of exercise, and end points for exercise.  Also discuss use of nitroglycerin and activating EMS.  Review options of places to exercise outside of rehab.  Review guidelines for sex with heart disease.   Cardiac Drugs I:  -Group instruction provided by verbal instruction and written materials to support subject.  Instructor reviews cardiac drug classes: antiplatelets, anticoagulants, beta blockers, and statins.  Instructor discusses reasons, side effects, and lifestyle considerations for each drug class.   Cardiac Drugs II:  -Group instruction provided by verbal instruction and written materials to support subject.  Instructor reviews cardiac drug classes: angiotensin converting enzyme inhibitors  (ACE-I), angiotensin II receptor blockers (ARBs), nitrates, and calcium channel blockers.  Instructor discusses reasons, side effects, and lifestyle considerations for each drug class.   Anatomy and Physiology of the Circulatory System:  Group verbal and written instruction and models provide basic cardiac anatomy and physiology, with the coronary electrical and arterial systems. Review of: AMI, Angina, Valve disease, Heart Failure, Peripheral Artery Disease, Cardiac Arrhythmia, Pacemakers, and the ICD.   Other Education:  -Group or individual verbal, written, or video instructions that support the educational goals of the cardiac rehab program.   Holiday Eating Survival Tips:  -Group instruction provided by PowerPoint slides, verbal discussion, and written materials to support subject matter. The instructor gives patients tips, tricks, and techniques to help them not only survive but enjoy the holidays despite the onslaught of food that accompanies the holidays.   Knowledge Questionnaire Score: Knowledge Questionnaire Score - 10/17/18 0942    Knowledge Questionnaire Score          Pre Score  21/24           Core Components/Risk Factors/Patient Goals at Admission: Personal Goals and Risk Factors at Admission - 10/17/18 0943    Core Components/Risk Factors/Patient Goals on Admission           Weight Management  Yes;Weight Loss;Weight Maintenance    Intervention  Weight Management: Develop a combined nutrition and exercise program designed to reach desired caloric intake, while maintaining appropriate intake of nutrient and fiber, sodium and fats, and appropriate energy expenditure required for the weight goal.;Weight Management: Provide education and appropriate resources to help participant work on and attain dietary goals.;Weight Management/Obesity: Establish reasonable short term and long term weight goals.    Admit Weight  191 lb 5.8 oz (86.8 kg)    Expected Outcomes  Short Term:  Continue to assess and modify interventions until short term weight is achieved;Long Term: Adherence to nutrition and physical activity/exercise program aimed toward attainment of established weight goal;Weight Maintenance: Understanding of the daily nutrition guidelines, which includes 25-35% calories from fat, 7% or less cal from saturated fats, less than 200mg  cholesterol, less than 1.5gm of sodium, & 5  or more servings of fruits and vegetables daily;Weight Loss: Understanding of general recommendations for a balanced deficit meal plan, which promotes 1-2 lb weight loss per week and includes a negative energy balance of [phone] kcal/d;Understanding recommendations for meals to include 15-35% energy as protein, 25-35% energy from fat, 35-60% energy from carbohydrates, less than 200mg  of dietary cholesterol, 20-35 gm of total fiber daily;Understanding of distribution of calorie intake throughout the day with the consumption of 4-5 meals/snacks    Hypertension  Yes    Intervention  Provide education on lifestyle modifcations including regular physical activity/exercise, weight management, moderate sodium restriction and increased consumption of fresh fruit, vegetables, and low fat dairy, alcohol moderation, and smoking cessation.;Monitor prescription use compliance.    Expected Outcomes  Short Term: Continued assessment and intervention until BP is < 140/64mm HG in hypertensive participants. < 130/4mm HG in hypertensive participants with diabetes, heart failure or chronic kidney disease.;Long Term: Maintenance of blood pressure at goal levels.    Lipids  Yes    Intervention  Provide education and support for participant on nutrition & aerobic/resistive exercise along with prescribed medications to achieve LDL 70mg , HDL >40mg .    Expected Outcomes  Short Term: Participant states understanding of desired cholesterol values and is compliant with medications prescribed. Participant is following exercise  prescription and nutrition guidelines.;Long Term: Cholesterol controlled with medications as prescribed, with individualized exercise RX and with personalized nutrition plan. Value goals: LDL < 70mg , HDL > 40 mg.    Personal Goal Other  Yes    Personal Goal  Pt wants to get back to his usual gym routine of interval training and using the elliptical.            Core Components/Risk Factors/Patient Goals Review:    Core Components/Risk Factors/Patient Goals at Discharge (Final Review):    ITP Comments: ITP Comments    Row Name 10/17/18 0810   ITP Comments  Dr. Fransico Him, Medical Director       Comments: Patient attended orientation from [phone]  to 0901  to review rules and guidelines for program. Completed 6 minute walk test, Intitial ITP, and exercise prescription.  VSS. Telemetry-sinus rhythm,  Asymptomatic.  Pt does c/o orthostatic dizziness at times, none today. Pt is concerned BP too low.  Pt not on antihypertensive medications.   Pt instructed to increase PO fluid intake.  Understanding verbalized.  Will continue to monitor.   Andi Hence, RN, BSN Cardiac Pulmonary Rehab 10/17/18  11:14 AM

## 2018-10-17 NOTE — Progress Notes (Signed)
Cardiac Rehab Medication Review by a Registered Nurse   Does the patient  feel that his/her medications are working for him/her?  yes  Has the patient been experiencing any side effects to the medications prescribed?  no  Does the patient measure his/her own blood pressure or blood glucose at home?  yes   Does the patient have any problems obtaining medications due to transportation or finances?   no  Understanding of regimen: excellent Understanding of indications: excellent Potential of compliance: excellent    RN comments:  Pt with good understanding and compliance with his medication regjmen. Pt verbalizes concern about taking Pepcid long term, he will discuss this with his MD at next scheduled appointment. Pt is awaiting financial verification for Repatha.  He has not started this medication yet. Pt does continue to use statin PRN after eating high fat meal.  Pt instructed in proper NTG use and when to call 911. Pt verbalizes fear about using Viagra with contraindication for NTG use, especially since his BP is soft.  Pt does not currently have Viagra prescription and is not using ED medication.  Pt advised to discuss this concern with his cardiologist or PCP.  Understanding verbalized.  Andi Hence, RN, BSN Cardiac Pulmonary Rehab       Nancie Neas Jarome Trull 10/17/2018 9:56 AM

## 2018-10-23 ENCOUNTER — Encounter (HOSPITAL_COMMUNITY)
Admission: RE | Admit: 2018-10-23 | Discharge: 2018-10-23 | Disposition: A | Discharge status: Home / Self Care | Payer: Medicare Other | Source: Ambulatory Visit | Attending: Cardiovascular Disease | Admitting: Cardiovascular Disease

## 2018-10-23 ENCOUNTER — Encounter (HOSPITAL_COMMUNITY): Payer: Self-pay

## 2018-10-23 ENCOUNTER — Encounter (HOSPITAL_COMMUNITY): Payer: Medicare Other

## 2018-10-23 DIAGNOSIS — E785 Hyperlipidemia, unspecified: Secondary | ICD-10-CM | POA: Diagnosis not present

## 2018-10-23 DIAGNOSIS — Z955 Presence of coronary angioplasty implant and graft: Secondary | ICD-10-CM

## 2018-10-23 DIAGNOSIS — Z79899 Other long term (current) drug therapy: Secondary | ICD-10-CM | POA: Diagnosis not present

## 2018-10-23 NOTE — Progress Notes (Signed)
Daily Session Note  Patient Details  Name: Ronnie Vazquez MRN: [id number] Date of Birth: [birth year] Referring Provider:     [place] from 10/17/2018 in [place]  Referring Provider  Sherren Mocha, MD      Encounter Date: 10/23/2018  Check In: Session Check In - 10/23/18 1130      Check-In   Supervising physician immediately available to respond to emergencies  [facility] immediately available    Physician(s)  Dr.Regalado     Location  [facility]    Staff Present  Andi Hence, RN, BSN;Molly DiVincenzo, MS, ACSM RCEP, Exercise Physiologist;Maria Whitaker, RN, Mosie Epstein, MS,ACSM CEP, Exercise Physiologist;Olinty [place], MS, ACSM CEP, Exercise Physiologist;Brittany Durene Fruits, BS, ACSM CEP, Exercise Physiologist;Cora Stetson Karle Starch, RN, BSN    Medication changes reported      No    Fall or balance concerns reported     No    Tobacco Cessation  No Change    Warm-up and Cool-down  Performed as group-led instruction    Resistance Training Performed  Yes    VAD Patient?  No    PAD/SET Patient?  No      Pain Assessment   Currently in Pain?  No/denies    Multiple Pain Sites  No       Capillary Blood Glucose: No results found for this or any previous visit (from the past 24 hour(s)).    Social History   Tobacco Use  Smoking Status Never Smoker  Smokeless Tobacco Never Used    Goals Met:  Exercise tolerated well  Goals Unmet:  Not Applicable  Comments: Pt started cardiac rehab today.  Pt tolerated light exercise without difficulty. VSS, telemetry-SR, asymptomatic.  Medication list reconciled. Pt denies barriers to medicaiton compliance.  PSYCHOSOCIAL ASSESSMENT:  PHQ-0. Pt exhibits positive coping skills, hopeful outlook with supportive family. No psychosocial needs identified at this time, no psychosocial interventions necessary.  Pt oriented to exercise equipment and routine.    Understanding  verbalized.    Dr. Fransico Him is Medical Director for Cardiac Rehab at [facility].

## 2018-10-25 ENCOUNTER — Encounter (HOSPITAL_COMMUNITY): Payer: Medicare Other

## 2018-10-26 ENCOUNTER — Telehealth: Payer: Self-pay | Admitting: Cardiovascular Disease

## 2018-10-26 NOTE — Telephone Encounter (Signed)
Ok to take Valium before flight.

## 2018-10-26 NOTE — Telephone Encounter (Signed)
Informed patient Valium has no contraindications with his other meds. He was grateful for call back.

## 2018-10-26 NOTE — Telephone Encounter (Signed)
Pt c/o medication issue: patient has question  1. Name of Medication: valium  2. How are you currently taking this medication (dosage and times per day)? unknow  3. Are you having a reaction (difficulty breathing--STAT)? no  4. What is your medication issue? Wants to know if it's ok to take before he gets on flight today. His plane takes off at 4:20pm today.

## 2018-10-26 NOTE — Progress Notes (Signed)
Cardiac Individual Treatment Plan  Patient Details  Name: Ronnie Vazquez MRN: [id number] Date of Birth: 24-Sep-1946 Referring Provider:     [facility] from 10/17/2018 in [place]  Referring Provider  Sherren Mocha, MD      Initial Encounter Date:    CARDIAC REHAB PHASE II ORIENTATION from 10/17/2018 in [place]  Date  10/17/18      Visit Diagnosis: 08/29/2018 Stented coronary artery  Patient's Home Medications on Admission:  Current Outpatient Medications:  .  acetaminophen (TYLENOL) 325 MG tablet, Take 650 mg by mouth at bedtime as needed (for sleep). , Disp: , Rfl:  .  aspirin 81 MG tablet, Take 81 mg by mouth daily., Disp: , Rfl:  .  cetirizine (ZYRTEC) 10 MG tablet, Take 10 mg by mouth daily as needed for allergies., Disp: , Rfl:  .  clopidogrel (PLAVIX) 75 MG tablet, Take 1 tablet (75 mg total) by mouth daily., Disp: 90 tablet, Rfl: 3 .  famotidine (PEPCID) 10 MG tablet, Take 1 tablet (10 mg total) by mouth 2 (two) times daily., Disp: 60 tablet, Rfl: 0 .  nitroGLYCERIN (NITROSTAT) 0.4 MG SL tablet, Place 1 tablet (0.4 mg total) under the tongue every 5 (five) minutes as needed for chest pain., Disp: 25 tablet, Rfl: 3 .  Evolocumab (REPATHA SURECLICK) 174 MG/ML SOAJ, Inject 140 mg into the skin every 14 (fourteen) days. (Patient not taking: Reported on 10/17/2018), Disp: 2 pen, Rfl: 11  Past Medical History: Past Medical History:  Diagnosis Date  . Chicken pox    hx  . Coronary artery disease    08/29/18 PCI/DES with bifurcation from LAD into 1st diag.   Marland Kitchen Hyperlipemia 02/02/2013  . Hyperlipidemia   . Left shoulder pain 02/02/2013  . Microscopic hematuria 02/02/2013  . Vision loss of left eye     Tobacco Use: Social History   Tobacco Use  Smoking Status Never Smoker  Smokeless Tobacco Never Used    Labs: Recent Review Flowsheet Data    Labs for ITP Cardiac and Pulmonary Rehab  Latest Ref Rng & Units 12/01/2015 04/05/2016 08/12/2017 08/10/2018 09/29/2018   Cholestrol 100 - 199 mg/dL 233(H) 247(H) 224(H) - 145   LDLCALC 0 - 99 mg/dL - - - - 75   LDLDIRECT mg/dL 128.0 102.0 117.0 - -   HDL >39 mg/dL 41.30 40.20 42.00 - 39(L)   Trlycerides 0 - 149 mg/dL 259.0(H) 323.0(H) 301.0(H) - 157(H)   Hemoglobin A1c 4.8 - 5.6 % 5.5 - 5.5 5.4 -   PHART 7.350 - 7.450 - - - 7.472(H) -   PCO2ART 32.0 - 48.0 mmHg - - - 33.9 -   HCO3 20.0 - 28.0 mmol/L - - - 24.5 -   O2SAT % - - - 87.6 -      Capillary Blood Glucose: No results found for: GLUCAP   Exercise Target Goals: Exercise Program Goal: Individual exercise prescription set using results from initial 6 min walk test and THRR while considering  patient's activity barriers and safety.   Exercise Prescription Goal: Initial exercise prescription builds to 30-45 minutes a day of aerobic activity, 2-3 days per week.  Home exercise guidelines will be given to patient during program as part of exercise prescription that the participant will acknowledge.  Activity Barriers & Risk Stratification: Activity Barriers & Cardiac Risk Stratification - 10/17/18 0937      Activity Barriers & Cardiac Risk Stratification   Activity  Barriers  Joint Problems   Left Shoulder Pain   Cardiac Risk Stratification  High       6 Minute Walk: 6 Minute Walk    Row Name 10/17/18 0906 10/18/18 1104       6 Minute Walk   Phase  Initial  -    Distance  1826 feet  1826 feet    Walk Time  6 minutes  6 minutes    # of Rest Breaks  0  -    MPH  3.46  -    METS  3.67  -    RPE  13  13    VO2 Peak  -  12.86    Symptoms  No  No    Resting HR  79 bpm  79 bpm    Resting BP  94/60  94/60    Max Ex. HR  102 bpm  102 bpm    Max Ex. BP  102/62  102/62    2 Minute Post BP  92/60  -       Oxygen Initial Assessment:   Oxygen Re-Evaluation:   Oxygen Discharge (Final Oxygen Re-Evaluation):   Initial Exercise Prescription: Initial Exercise  Prescription - 10/17/18 0900      Date of Initial Exercise RX and Referring Provider   Date  10/17/18    Referring Provider  Sherren Mocha, MD    Expected Discharge Date  01/26/19      Bike   Level  1.2    Minutes  10    METs  3.6      NuStep   Level  3    SPM  85    Minutes  10    METs  3.6      Track   Laps  15    Minutes  10    METs  3.6      Prescription Details   Frequency (times per week)  3    Duration  Progress to 30 minutes of continuous aerobic without signs/symptoms of physical distress      Intensity   THRR 40-80% of Max Heartrate  60-121    Ratings of Perceived Exertion  11-13      Progression   Progression  Continue to progress workloads to maintain intensity without signs/symptoms of physical distress.      Resistance Training   Training Prescription  Yes    Weight  3 lbs    Reps  10-15       Perform Capillary Blood Glucose checks as needed.  Exercise Prescription Changes: Exercise Prescription Changes    Row Name 10/23/18 1300             Response to Exercise   Blood Pressure (Admit)  122/73       Blood Pressure (Exercise)  126/70       Blood Pressure (Exit)  100/68       Heart Rate (Admit)  90 bpm       Heart Rate (Exercise)  107 bpm       Heart Rate (Exit)  85 bpm       Rating of Perceived Exertion (Exercise)  13       Perceived Dyspnea (Exercise)  0       Symptoms  None       Comments  Pt oriented to exercise equipment       Duration  Progress to 30 minutes of  aerobic without signs/symptoms of physical distress  Intensity  THRR New         Progression   Progression  Continue to progress workloads to maintain intensity without signs/symptoms of physical distress.       Average METs  3.24         Resistance Training   Training Prescription  Yes       Weight  3lbs       Reps  10-15       Time  10 Minutes         Interval Training   Interval Training  No         Bike   Level  1.2       Minutes  10       METs  3.61          NuStep   Level  3       SPM  80       Minutes  10       METs  2.5         Track   Laps  15       Minutes  10       METs  3.61          Exercise Comments: Exercise Comments    Row Name 10/23/18 1327           Exercise Comments  Pt's first day of Cardiac Rehab today. Pt responded well to exercise prescription. Pt reported that he has been having low back pain. Pt did not report any problems with exercise today. Will continue to monitor and progress pt as tolerated.           Exercise Goals and Review: Exercise Goals    Row Name 10/17/18 1014             Exercise Goals   Increase Physical Activity  Yes       Intervention  Provide advice, education, support and counseling about physical activity/exercise needs.;Develop an individualized exercise prescription for aerobic and resistive training based on initial evaluation findings, risk stratification, comorbidities and participant's personal goals.       Expected Outcomes  Short Term: Attend rehab on a regular basis to increase amount of physical activity.       Increase Strength and Stamina  Yes       Intervention  Provide advice, education, support and counseling about physical activity/exercise needs.;Develop an individualized exercise prescription for aerobic and resistive training based on initial evaluation findings, risk stratification, comorbidities and participant's personal goals.       Expected Outcomes  Short Term: Increase workloads from initial exercise prescription for resistance, speed, and METs.       Able to understand and use rate of perceived exertion (RPE) scale  Yes       Intervention  Provide education and explanation on how to use RPE scale       Expected Outcomes  Short Term: Able to use RPE daily in rehab to express subjective intensity level;Long Term:  Able to use RPE to guide intensity level when exercising independently       Knowledge and understanding of Target Heart Rate Range (THRR)   Yes       Intervention  Provide education and explanation of THRR including how the numbers were predicted and where they are located for reference       Expected Outcomes  Short Term: Able to state/look up THRR;Long Term: Able to use THRR to govern intensity  when exercising independently;Short Term: Able to use daily as guideline for intensity in rehab       Able to check pulse independently  Yes       Intervention  Provide education and demonstration on how to check pulse in carotid and radial arteries.;Review the importance of being able to check your own pulse for safety during independent exercise       Expected Outcomes  Short Term: Able to explain why pulse checking is important during independent exercise       Understanding of Exercise Prescription  Yes       Intervention  Provide education, explanation, and written materials on patient's individual exercise prescription       Expected Outcomes  Short Term: Able to explain program exercise prescription;Long Term: Able to explain home exercise prescription to exercise independently          Exercise Goals Re-Evaluation :   Discharge Exercise Prescription (Final Exercise Prescription Changes): Exercise Prescription Changes - 10/23/18 1300      Response to Exercise   Blood Pressure (Admit)  122/73    Blood Pressure (Exercise)  126/70    Blood Pressure (Exit)  100/68    Heart Rate (Admit)  90 bpm    Heart Rate (Exercise)  107 bpm    Heart Rate (Exit)  85 bpm    Rating of Perceived Exertion (Exercise)  13    Perceived Dyspnea (Exercise)  0    Symptoms  None    Comments  Pt oriented to exercise equipment    Duration  Progress to 30 minutes of  aerobic without signs/symptoms of physical distress    Intensity  THRR New      Progression   Progression  Continue to progress workloads to maintain intensity without signs/symptoms of physical distress.    Average METs  3.24      Resistance Training   Training Prescription  Yes     Weight  3lbs    Reps  10-15    Time  10 Minutes      Interval Training   Interval Training  No      Bike   Level  1.2    Minutes  10    METs  3.61      NuStep   Level  3    SPM  80    Minutes  10    METs  2.5      Track   Laps  15    Minutes  10    METs  3.61       Nutrition:  Target Goals: Understanding of nutrition guidelines, daily intake of sodium 1500mg , cholesterol 200mg , calories 30% from fat and 7% or less from saturated fats, daily to have 5 or more servings of fruits and vegetables.  Biometrics: Pre Biometrics - 10/17/18 0908      Pre Biometrics   Height  5' 9.25" (1.759 m)    Weight  86.8 kg    Waist Circumference  40.5 inches    Hip Circumference  40 inches    Waist to Hip Ratio  1.01 %    BMI (Calculated)  28.05    Triceps Skinfold  28 mm    % Body Fat  30.4 %    Grip Strength  37 kg    Flexibility  8 in    Single Leg Stand  7 seconds        Nutrition Therapy Plan and Nutrition Goals: Nutrition Therapy &  Goals - 10/17/18 0918      Nutrition Therapy   Diet  heart healthy      Personal Nutrition Goals   Nutrition Goal  Pt to identify and limit food sources of saturated fat, trans fat, refined carbohydrates and sodium    Personal Goal #2  pt to identify food quantities necessary to achieve weight loss of 6-24 lbs at graduation from cardiac rehab. Goal weight of less than 180 lbs desired      Intervention Plan   Intervention  Prescribe, educate and counsel regarding individualized specific dietary modifications aiming towards targeted core components such as weight, hypertension, lipid management, diabetes, heart failure and other comorbidities.    Expected Outcomes  Short Term Goal: Understand basic principles of dietary content, such as calories, fat, sodium, cholesterol and nutrients.;Long Term Goal: Adherence to prescribed nutrition plan.       Nutrition Assessments: Nutrition Assessments - 10/17/18 0920      MEDFICTS Scores   Pre  Score  18       Nutrition Goals Re-Evaluation:   Nutrition Goals Re-Evaluation:   Nutrition Goals Discharge (Final Nutrition Goals Re-Evaluation):   Psychosocial: Target Goals: Acknowledge presence or absence of significant depression and/or stress, maximize coping skills, provide positive support system. Participant is able to verbalize types and ability to use techniques and skills needed for reducing stress and depression.  Initial Review & Psychosocial Screening: Initial Psych Review & Screening - 10/17/18 0853      Initial Review   Current issues with  None Identified      Family Dynamics   Good Support System?  Yes      Barriers   Psychosocial barriers to participate in program  There are no identifiable barriers or psychosocial needs.      Screening Interventions   Interventions  Encouraged to exercise       Quality of Life Scores: Quality of Life - 10/17/18 0854      Quality of Life   Select  Quality of Life      Quality of Life Scores   Health/Function Pre  23.5 %    Socioeconomic Pre  26 %    Psych/Spiritual Pre  24.6 %    Family Pre  27.6 %    GLOBAL Pre  24.9 %      Scores of 19 and below usually indicate a poorer quality of life in these areas.  A difference of  2-3 points is a clinically meaningful difference.  A difference of 2-3 points in the total score of the Quality of Life Index has been associated with significant improvement in overall quality of life, self-image, physical symptoms, and general health in studies assessing change in quality of life.  PHQ-9: Recent Review Flowsheet Data    Depression screen [facility] 2/9 10/23/2018 08/12/2017 12/01/2015   Decreased Interest 0 0 0   Down, Depressed, Hopeless 0 0 0   PHQ - 2 Score 0 0 0     Interpretation of Total Score  Total Score Depression Severity:  1-4 = Minimal depression, 5-9 = Mild depression, 10-14 = Moderate depression, 15-19 = Moderately severe depression, 20-27 = Severe depression    Psychosocial Evaluation and Intervention: Psychosocial Evaluation - 10/23/18 1213      Psychosocial Evaluation & Interventions   Interventions  Encouraged to exercise with the program and follow exercise prescription    Comments  No pyschosocial needs identified. No intervention necessary.  Kayan enjoys Marketing executive.     Expected  Outcomes  Aayden will continue to exhibit a positive outlook with good coping skills.     Continue Psychosocial Services   No Follow up required       Psychosocial Re-Evaluation:   Psychosocial Discharge (Final Psychosocial Re-Evaluation):   Vocational Rehabilitation: Provide vocational rehab assistance to qualifying candidates.   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 Participant will state understanding/return demonstration of topics presented.  Learning Barriers/Preferences: Learning Barriers/Preferences - 10/17/18 0941      Learning Barriers/Preferences   Learning Barriers  Sight   Loss of vision left eye   Learning Preferences  Written Material       Education Topics: Count Your Pulse:  -Group instruction provided by verbal instruction, demonstration, patient participation and written materials to support subject.  Instructors address importance of being able to find your pulse and how to count your pulse when at home without a heart monitor.  Patients get hands on experience counting their pulse with staff help and individually.   Heart Attack, Angina, and Risk Factor Modification:  -Group instruction provided by verbal instruction, video, and written materials to support subject.  Instructors address signs and symptoms of angina and heart attacks.    Also discuss  risk factors for heart disease and how to make changes to improve heart health risk factors.   Functional Fitness:  -Group instruction provided by verbal instruction, demonstration, patient participation, and written materials to support subject.  Instructors address safety measures for doing things around the house.  Discuss how to get up and down off the floor, how to pick things up properly, how to safely get out of a chair without assistance, and balance training.   Meditation and Mindfulness:  -Group instruction provided by verbal instruction, patient participation, and written materials to support subject.  Instructor addresses importance of mindfulness and meditation practice to help reduce stress and improve awareness.  Instructor also leads participants through a meditation exercise.    Stretching for Flexibility and Mobility:  -Group instruction provided by verbal instruction, patient participation, and written materials to support subject.  Instructors lead participants through series of stretches that are designed to increase flexibility thus improving mobility.  These stretches are additional exercise for major muscle groups that are typically performed during regular warm up and cool down.   Hands Only CPR:  -Group verbal, video, and participation provides a basic overview of AHA guidelines for community CPR. Role-play of emergencies allow participants the opportunity to practice calling for help and chest compression technique with discussion of AED use.   Hypertension: -Group verbal and written instruction that provides a basic overview of hypertension including the most recent diagnostic guidelines, risk factor reduction with self-care instructions and medication management.    Nutrition I class: Heart Healthy Eating:  -Group instruction provided by PowerPoint slides, verbal discussion, and written materials to support subject matter. The instructor gives an explanation and  review of the Therapeutic Lifestyle Changes diet recommendations, which includes a discussion on lipid goals, dietary fat, sodium, fiber, plant stanol/sterol esters, sugar, and the components of a well-balanced, healthy diet.   Nutrition II class: Lifestyle Skills:  -Group instruction provided by PowerPoint slides, verbal discussion, and written materials to support subject matter.  The instructor gives an explanation and review of label reading, grocery shopping for heart health, heart healthy recipe modifications, and ways to make healthier choices when eating out.   Diabetes Question & Answer:  -Group instruction provided by PowerPoint slides, verbal discussion, and written materials to support subject matter. The instructor gives an explanation and review of diabetes co-morbidities, pre- and post-prandial blood glucose goals, pre-exercise blood glucose goals, signs, symptoms, and treatment of hypoglycemia and hyperglycemia, and foot care basics.   Diabetes Blitz:  -Group instruction provided by PowerPoint slides, verbal discussion, and written materials to support subject matter. The instructor gives an explanation and review of the physiology behind type 1 and type 2 diabetes, diabetes medications and rational behind using different medications, pre- and post-prandial blood glucose recommendations and Hemoglobin A1c goals, diabetes diet, and exercise including blood glucose guidelines for exercising safely.    Portion Distortion:  -Group instruction provided by PowerPoint slides, verbal discussion, written materials, and food models to support subject matter. The instructor gives an explanation of serving size versus portion size, changes in portions sizes over the last 20 years, and what consists of a serving from each food group.   Stress Management:  -Group instruction provided by verbal instruction, video, and written materials to support subject matter.  Instructors review role of stress  in heart disease and how to cope with stress positively.     Exercising on Your Own:  -Group instruction provided by verbal instruction, power point, and written materials to support subject.  Instructors discuss benefits of exercise, components of exercise, frequency and intensity of exercise, and end points for exercise.  Also discuss use of nitroglycerin and activating EMS.  Review options of places to exercise outside of rehab.  Review guidelines for sex with heart disease.   Cardiac Drugs I:  -Group instruction provided by verbal instruction and written materials to support subject.  Instructor reviews cardiac drug classes: antiplatelets, anticoagulants, beta blockers, and statins.  Instructor discusses reasons, side effects, and lifestyle considerations for each drug class.   Cardiac Drugs II:  -Group instruction provided by verbal instruction and written materials to support subject.  Instructor reviews cardiac drug classes: angiotensin converting enzyme inhibitors (ACE-I), angiotensin II receptor blockers (ARBs), nitrates, and calcium channel blockers.  Instructor discusses reasons, side effects, and lifestyle considerations for each drug class.   Anatomy and Physiology of the Circulatory System:  Group verbal and written instruction and models provide basic cardiac anatomy and physiology, with the coronary electrical and arterial systems. Review of: AMI, Angina, Valve disease, Heart Failure, Peripheral Artery Disease, Cardiac Arrhythmia, Pacemakers, and the ICD.   Other Education:  -Group or individual verbal, written, or video instructions that support the educational goals of the cardiac rehab program.   Holiday Eating Survival Tips:  -Group instruction provided by PowerPoint slides, verbal discussion, and written materials to support subject matter. The instructor gives patients tips, tricks, and techniques to help them not only survive but enjoy the holidays despite the onslaught  of food that accompanies the holidays.   Knowledge Questionnaire Score: Knowledge Questionnaire Score - 10/17/18 0942      Knowledge Questionnaire Score   Pre Score  21/24       Core Components/Risk Factors/Patient Goals at Admission: Personal Goals and Risk Factors at Admission - 10/17/18 0943      Core Components/Risk Factors/Patient Goals on Admission    Weight Management  Yes;Weight Loss;Weight Maintenance    Intervention  Weight Management: Develop a combined nutrition and  exercise program designed to reach desired caloric intake, while maintaining appropriate intake of nutrient and fiber, sodium and fats, and appropriate energy expenditure required for the weight goal.;Weight Management: Provide education and appropriate resources to help participant work on and attain dietary goals.;Weight Management/Obesity: Establish reasonable short term and long term weight goals.    Admit Weight  191 lb 5.8 oz (86.8 kg)    Expected Outcomes  Short Term: Continue to assess and modify interventions until short term weight is achieved;Long Term: Adherence to nutrition and physical activity/exercise program aimed toward attainment of established weight goal;Weight Maintenance: Understanding of the daily nutrition guidelines, which includes 25-35% calories from fat, 7% or less cal from saturated fats, less than 200mg  cholesterol, less than 1.5gm of sodium, & 5 or more servings of fruits and vegetables daily;Weight Loss: Understanding of general recommendations for a balanced deficit meal plan, which promotes 1-2 lb weight loss per week and includes a negative energy balance of [phone] kcal/d;Understanding recommendations for meals to include 15-35% energy as protein, 25-35% energy from fat, 35-60% energy from carbohydrates, less than 200mg  of dietary cholesterol, 20-35 gm of total fiber daily;Understanding of distribution of calorie intake throughout the day with the consumption of 4-5 meals/snacks     Hypertension  Yes    Intervention  Provide education on lifestyle modifcations including regular physical activity/exercise, weight management, moderate sodium restriction and increased consumption of fresh fruit, vegetables, and low fat dairy, alcohol moderation, and smoking cessation.;Monitor prescription use compliance.    Expected Outcomes  Short Term: Continued assessment and intervention until BP is < 140/25mm HG in hypertensive participants. < 130/72mm HG in hypertensive participants with diabetes, heart failure or chronic kidney disease.;Long Term: Maintenance of blood pressure at goal levels.    Lipids  Yes    Intervention  Provide education and support for participant on nutrition & aerobic/resistive exercise along with prescribed medications to achieve LDL 70mg , HDL >40mg .    Expected Outcomes  Short Term: Participant states understanding of desired cholesterol values and is compliant with medications prescribed. Participant is following exercise prescription and nutrition guidelines.;Long Term: Cholesterol controlled with medications as prescribed, with individualized exercise RX and with personalized nutrition plan. Value goals: LDL < 70mg , HDL > 40 mg.    Personal Goal Other  Yes    Personal Goal  Pt wants to get back to his usual gym routine of interval training and using the elliptical.        Core Components/Risk Factors/Patient Goals Review:  Goals and Risk Factor Review    Row Name 10/23/18 1228             Core Components/Risk Factors/Patient Goals Review   Personal Goals Review  Weight Management/Obesity;Lipids;Hypertension;Other       Review  Pt willing to participate in CR exercise.  [place] would like to get back to his routine that he has been doing at the gym.       Expected Outcomes  Pt will continue to participate in CR exercise, nutrition, and lifestyle modification opportunities.           Core Components/Risk Factors/Patient Goals at Discharge (Final Review):   Goals and Risk Factor Review - 10/23/18 1228      Core Components/Risk Factors/Patient Goals Review   Personal Goals Review  Weight Management/Obesity;Lipids;Hypertension;Other    Review  Pt willing to participate in CR exercise.  Shiro would like to get back to his routine that he has been doing at the gym.    Expected  Outcomes  Pt will continue to participate in CR exercise, nutrition, and lifestyle modification opportunities.        ITP Comments: ITP Comments    Row Name 10/17/18 0810 10/23/18 1213         ITP Comments  Dr. Fransico Him, Medical Director   30 Day ITP Review. Pt started exercise today and tolerared it well.          Comments: See ITP Comments.

## 2018-10-27 ENCOUNTER — Encounter (HOSPITAL_COMMUNITY): Payer: Medicare Other

## 2018-10-27 ENCOUNTER — Telehealth: Payer: Self-pay | Admitting: Pharmacist

## 2018-10-27 ENCOUNTER — Encounter (HOSPITAL_COMMUNITY)
Admission: RE | Admit: 2018-10-27 | Discharge: 2018-10-27 | Disposition: A | Discharge status: Home / Self Care | Payer: Medicare Other | Source: Ambulatory Visit | Attending: Cardiovascular Disease | Admitting: Cardiovascular Disease

## 2018-10-27 DIAGNOSIS — Z955 Presence of coronary angioplasty implant and graft: Secondary | ICD-10-CM | POA: Diagnosis not present

## 2018-10-27 DIAGNOSIS — E785 Hyperlipidemia, unspecified: Secondary | ICD-10-CM | POA: Insufficient documentation

## 2018-10-27 DIAGNOSIS — Z79899 Other long term (current) drug therapy: Secondary | ICD-10-CM | POA: Insufficient documentation

## 2018-10-27 NOTE — Telephone Encounter (Signed)
Pt denied for Rep[place] through [employer] as income too high. Spoke with him and he will consider paying $380 copay for 2 months as he will retire next month and income will be less. We can consider reapplying for support at that time.   We also discussed clinical trials as an option and he will consider this as well. He will call with his decision after speaking with his wife.   He also wonders if the paranoia he felt on his flight is related to his cardiovascular health. Advised that I did not think that this was related and that it was ok from a cardiac standpoint to use the valium if needed for additional flights.

## 2018-10-27 NOTE — Telephone Encounter (Signed)
Pt called back to clinic and states he is willing to pay copay for Calpine. I called [facility] and verified that they are using the 50% lower NDC price drug and they are. Unsure how copay remains close to $400 other than pt potentially not having met his deductible yet although it is November. He will call clinic once he receives his first shipment so that can practice his first injection in clinic. Will also try to provide pt with samples if available due to expensive copay.

## 2018-10-30 ENCOUNTER — Encounter (HOSPITAL_COMMUNITY): Payer: Medicare Other

## 2018-10-30 ENCOUNTER — Telehealth: Payer: Self-pay | Admitting: Pharmacist

## 2018-10-30 ENCOUNTER — Encounter (HOSPITAL_COMMUNITY)
Admission: RE | Admit: 2018-10-30 | Discharge: 2018-10-30 | Disposition: A | Discharge status: Home / Self Care | Payer: Medicare Other | Source: Ambulatory Visit | Attending: Cardiovascular Disease | Admitting: Cardiovascular Disease

## 2018-10-30 DIAGNOSIS — Z955 Presence of coronary angioplasty implant and graft: Secondary | ICD-10-CM

## 2018-10-30 DIAGNOSIS — E7800 Pure hypercholesterolemia, unspecified: Secondary | ICD-10-CM

## 2018-10-30 DIAGNOSIS — Z79899 Other long term (current) drug therapy: Secondary | ICD-10-CM | POA: Diagnosis not present

## 2018-10-30 DIAGNOSIS — E785 Hyperlipidemia, unspecified: Secondary | ICD-10-CM | POA: Diagnosis not present

## 2018-10-30 NOTE — Telephone Encounter (Signed)
Pt presented to clinic for [place] training. He successfully self administered injection into his abdomen. He also filled out [employer] Application for 4739 - he retires 12/26/18 and 2020 income should qualify him for patient assistance (current copay $375 per month). His PCP will check fasting lipid panel during 11/28/18 OV to assess efficacy.

## 2018-11-01 ENCOUNTER — Encounter (HOSPITAL_COMMUNITY): Payer: Medicare Other

## 2018-11-03 ENCOUNTER — Ambulatory Visit: Payer: Managed Care, Other (non HMO) | Admitting: Family Medicine

## 2018-11-03 ENCOUNTER — Encounter (HOSPITAL_COMMUNITY): Payer: Medicare Other

## 2018-11-06 ENCOUNTER — Encounter (HOSPITAL_COMMUNITY): Payer: Medicare Other

## 2018-11-06 ENCOUNTER — Encounter (HOSPITAL_COMMUNITY)
Admission: RE | Admit: 2018-11-06 | Discharge: 2018-11-06 | Disposition: A | Discharge status: Home / Self Care | Payer: Medicare Other | Source: Ambulatory Visit | Attending: Cardiovascular Disease | Admitting: Cardiovascular Disease

## 2018-11-06 DIAGNOSIS — E785 Hyperlipidemia, unspecified: Secondary | ICD-10-CM | POA: Diagnosis not present

## 2018-11-06 DIAGNOSIS — Z79899 Other long term (current) drug therapy: Secondary | ICD-10-CM | POA: Diagnosis not present

## 2018-11-06 DIAGNOSIS — Z955 Presence of coronary angioplasty implant and graft: Secondary | ICD-10-CM | POA: Diagnosis not present

## 2018-11-08 ENCOUNTER — Encounter (HOSPITAL_COMMUNITY)
Admission: RE | Admit: 2018-11-08 | Discharge: 2018-11-08 | Disposition: A | Discharge status: Home / Self Care | Payer: Medicare Other | Source: Ambulatory Visit | Attending: Cardiovascular Disease | Admitting: Cardiovascular Disease

## 2018-11-08 ENCOUNTER — Encounter (HOSPITAL_COMMUNITY): Payer: Medicare Other

## 2018-11-08 DIAGNOSIS — Z955 Presence of coronary angioplasty implant and graft: Secondary | ICD-10-CM | POA: Diagnosis not present

## 2018-11-08 DIAGNOSIS — Z79899 Other long term (current) drug therapy: Secondary | ICD-10-CM | POA: Diagnosis not present

## 2018-11-08 DIAGNOSIS — E785 Hyperlipidemia, unspecified: Secondary | ICD-10-CM | POA: Diagnosis not present

## 2018-11-08 NOTE — Progress Notes (Signed)
I have reviewed a Home Exercise Prescription with [name] . Quavis is currently exercising at [employer]. The patient was advised to continue to exercise 2-3 days a week at local gym.  Alter and I discussed how to progress their exercise prescription. The patient stated that they understand the exercise prescription.  Pt states he is not confident with walking on treadmill since heart event. Will work with pt in rehab with walking on treadmill to build confidence. We reviewed exercise guidelines, target heart rate during exercise, RPE Scale, weather conditions, NTG use, endpoints for exercise, warmup and cool down. Patient is encouraged to come to me with any questions. I will continue to follow up with the patient to assist them with progression and safety.    Carma Lair MS, ACSM CEP 2:19 PM 11/08/2018

## 2018-11-10 ENCOUNTER — Encounter (HOSPITAL_COMMUNITY): Payer: Medicare Other

## 2018-11-10 ENCOUNTER — Encounter (HOSPITAL_COMMUNITY)
Admission: RE | Admit: 2018-11-10 | Discharge: 2018-11-10 | Disposition: A | Discharge status: Home / Self Care | Payer: Medicare Other | Source: Ambulatory Visit | Attending: Cardiovascular Disease | Admitting: Cardiovascular Disease

## 2018-11-10 DIAGNOSIS — Z955 Presence of coronary angioplasty implant and graft: Secondary | ICD-10-CM | POA: Diagnosis not present

## 2018-11-10 DIAGNOSIS — Z79899 Other long term (current) drug therapy: Secondary | ICD-10-CM | POA: Diagnosis not present

## 2018-11-10 DIAGNOSIS — E785 Hyperlipidemia, unspecified: Secondary | ICD-10-CM | POA: Diagnosis not present

## 2018-11-13 ENCOUNTER — Encounter (HOSPITAL_COMMUNITY)
Admission: RE | Admit: 2018-11-13 | Discharge: 2018-11-13 | Disposition: A | Discharge status: Home / Self Care | Payer: Medicare Other | Source: Ambulatory Visit | Attending: Cardiovascular Disease | Admitting: Cardiovascular Disease

## 2018-11-13 ENCOUNTER — Encounter (HOSPITAL_COMMUNITY): Payer: Medicare Other

## 2018-11-13 ENCOUNTER — Telehealth: Payer: Self-pay | Admitting: Physician Assistant

## 2018-11-13 DIAGNOSIS — Z955 Presence of coronary angioplasty implant and graft: Secondary | ICD-10-CM | POA: Diagnosis not present

## 2018-11-13 DIAGNOSIS — E785 Hyperlipidemia, unspecified: Secondary | ICD-10-CM | POA: Diagnosis not present

## 2018-11-13 DIAGNOSIS — Z79899 Other long term (current) drug therapy: Secondary | ICD-10-CM | POA: Diagnosis not present

## 2018-11-13 NOTE — Telephone Encounter (Signed)
Pt came in office in reference to Rx that was prescribed last time he was seen for stomach acid. Pt stated he is out of Rx and stated it has come back. Pt would like a phone call in reference to this. Please advise.

## 2018-11-13 NOTE — Telephone Encounter (Signed)
Needs appt. Please.

## 2018-11-13 NOTE — Telephone Encounter (Signed)
Patient states the medication name is famotidine (PEPCID) 10 MG tablet. Patient would like a refill on this.

## 2018-11-13 NOTE — Telephone Encounter (Signed)
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 We are not his PCP, needs to contact his PCP.

## 2018-11-14 NOTE — Telephone Encounter (Signed)
I called the pt and scheduled an appt for 11/26 per the pts request.

## 2018-11-15 ENCOUNTER — Encounter (HOSPITAL_COMMUNITY): Payer: Medicare Other

## 2018-11-16 ENCOUNTER — Encounter (HOSPITAL_COMMUNITY): Payer: Self-pay

## 2018-11-16 NOTE — Progress Notes (Signed)
Cardiac Individual Treatment Plan  Patient Details  Name: Ronnie Vazquez MRN: [id number] Date of Birth: [birth year] Referring Provider:     [facility] from 10/17/2018 in [facility]  Referring Provider  Sherren Mocha, MD      Initial Encounter Date:    CARDIAC REHAB PHASE II ORIENTATION from 10/17/2018 in [facility]  Date  10/17/18      Visit Diagnosis: 08/29/2018 Stented coronary artery  Patient's Home Medications on Admission:  Current Outpatient Medications:  .  acetaminophen (TYLENOL) 325 MG tablet, Take 650 mg by mouth at bedtime as needed (for sleep). , Disp: , Rfl:  .  aspirin 81 MG tablet, Take 81 mg by mouth daily., Disp: , Rfl:  .  cetirizine (ZYRTEC) 10 MG tablet, Take 10 mg by mouth daily as needed for allergies., Disp: , Rfl:  .  clopidogrel (PLAVIX) 75 MG tablet, Take 1 tablet (75 mg total) by mouth daily., Disp: 90 tablet, Rfl: 3 .  Evolocumab (REPATHA SURECLICK) 884 MG/ML SOAJ, Inject 140 mg into the skin every 14 (fourteen) days., Disp: 2 pen, Rfl: 11 .  famotidine (PEPCID) 10 MG tablet, Take 1 tablet (10 mg total) by mouth 2 (two) times daily., Disp: 60 tablet, Rfl: 0 .  nitroGLYCERIN (NITROSTAT) 0.4 MG SL tablet, Place 1 tablet (0.4 mg total) under the tongue every 5 (five) minutes as needed for chest pain., Disp: 25 tablet, Rfl: 3  Past Medical History: Past Medical History:  Diagnosis Date  . Chicken pox    hx  . Coronary artery disease    08/29/18 PCI/DES with bifurcation from LAD into 1st diag.   [facility] Kitchen Hyperlipemia 02/02/2013  . Hyperlipidemia   . Left shoulder pain 02/02/2013  . Microscopic hematuria 02/02/2013  . Vision loss of left eye     Tobacco Use: Social History   Tobacco Use  Smoking Status Never Smoker  Smokeless Tobacco Never Used    Labs: Recent Review Flowsheet Data    Labs for ITP Cardiac and Pulmonary Rehab Latest Ref Rng & Units 12/01/2015 04/05/2016  08/12/2017 08/10/2018 09/29/2018   Cholestrol 100 - 199 mg/dL 233(H) 247(H) 224(H) - 145   LDLCALC 0 - 99 mg/dL - - - - 75   LDLDIRECT mg/dL 128.0 102.0 117.0 - -   HDL >39 mg/dL 41.30 40.20 42.00 - 39(L)   Trlycerides 0 - 149 mg/dL 259.0(H) 323.0(H) 301.0(H) - 157(H)   Hemoglobin A1c 4.8 - 5.6 % 5.5 - 5.5 5.4 -   PHART 7.350 - 7.450 - - - 7.472(H) -   PCO2ART 32.0 - 48.0 mmHg - - - 33.9 -   HCO3 20.0 - 28.0 mmol/L - - - 24.5 -   O2SAT % - - - 87.6 -      Capillary Blood Glucose: No results found for: GLUCAP   Exercise Target Goals: Exercise Program Goal: Individual exercise prescription set using results from initial 6 min walk test and THRR while considering  patient's activity barriers and safety.   Exercise Prescription Goal: Initial exercise prescription builds to 30-45 minutes a day of aerobic activity, 2-3 days per week.  Home exercise guidelines will be given to patient during program as part of exercise prescription that the participant will acknowledge.  Activity Barriers & Risk Stratification: Activity Barriers & Cardiac Risk Stratification - 10/17/18 0937      Activity Barriers & Cardiac Risk Stratification   Activity Barriers  Joint Problems  Left Shoulder Pain   Cardiac Risk Stratification  High       6 Minute Walk: 6 Minute Walk    Row Name 10/17/18 0906 10/18/18 1104       6 Minute Walk   Phase  Initial  -    Distance  1826 feet  1826 feet    Walk Time  6 minutes  6 minutes    # of Rest Breaks  0  -    MPH  3.46  -    METS  3.67  -    RPE  13  13    VO2 Peak  -  12.86    Symptoms  No  No    Resting HR  79 bpm  79 bpm    Resting BP  94/60  94/60    Max Ex. HR  102 bpm  102 bpm    Max Ex. BP  102/62  102/62    2 Minute Post BP  92/60  -       Oxygen Initial Assessment:   Oxygen Re-Evaluation:   Oxygen Discharge (Final Oxygen Re-Evaluation):   Initial Exercise Prescription: Initial Exercise Prescription - 10/17/18 0900      Date of  Initial Exercise RX and Referring Provider   Date  10/17/18    Referring Provider  Sherren Mocha, MD    Expected Discharge Date  01/26/19      Bike   Level  1.2    Minutes  10    METs  3.6      NuStep   Level  3    SPM  85    Minutes  10    METs  3.6      Track   Laps  15    Minutes  10    METs  3.6      Prescription Details   Frequency (times per week)  3    Duration  Progress to 30 minutes of continuous aerobic without signs/symptoms of physical distress      Intensity   THRR 40-80% of Max Heartrate  60-121    Ratings of Perceived Exertion  11-13      Progression   Progression  Continue to progress workloads to maintain intensity without signs/symptoms of physical distress.      Resistance Training   Training Prescription  Yes    Weight  3 lbs    Reps  10-15       Perform Capillary Blood Glucose checks as needed.  Exercise Prescription Changes: Exercise Prescription Changes    Row Name 10/23/18 1300 11/06/18 1600 11/13/18 1410         Response to Exercise   Blood Pressure (Admit)  122/73  100/70  108/66     Blood Pressure (Exercise)  126/70  124/72  138/62     Blood Pressure (Exit)  100/68  102/60  102/68     Heart Rate (Admit)  90 bpm  76 bpm  68 bpm     Heart Rate (Exercise)  107 bpm  97 bpm  95 bpm     Heart Rate (Exit)  85 bpm  82 bpm  60 bpm     Rating of Perceived Exertion (Exercise)  13  11  12      Perceived Dyspnea (Exercise)  0  0  0     Symptoms  None  None  None     Comments  Pt oriented to exercise equipment  None  Added  Treadmill to Exercise Prescription      Duration  Progress to 30 minutes of  aerobic without signs/symptoms of physical distress  Progress to 30 minutes of  aerobic without signs/symptoms of physical distress  Progress to 30 minutes of  aerobic without signs/symptoms of physical distress     Intensity  THRR New  THRR unchanged  THRR unchanged       Progression   Progression  Continue to progress workloads to maintain  intensity without signs/symptoms of physical distress.  Continue to progress workloads to maintain intensity without signs/symptoms of physical distress.  Continue to progress workloads to maintain intensity without signs/symptoms of physical distress.     Average METs  3.24  3.71  3.8       Resistance Training   Training Prescription  Yes  Yes  Yes     Weight  3lbs  5lbs  5lbs     Reps  10-15  10-15  10-15     Time  10 Minutes  10 Minutes  10 Minutes       Interval Training   Interval Training  No  No  No       Treadmill   MPH  -  -  2.8     Grade  -  -  2     Minutes  -  -  10     METs  -  -  3.91       Bike   Level  1.2  1.2  1.2     Minutes  10  10  10      METs  3.61  3.61  3.66       NuStep   Level  3  5  5      SPM  80  95  95     Minutes  10  10  10      METs  2.5  3.7  3.8       Track   Laps  15  16  -     Minutes  10  10  -     METs  3.61  3.76  -       Home Exercise Plan   Plans to continue exercise at  -  [facility] (comment) [employer]     Frequency  -  -  Add 2 additional days to program exercise sessions.     Initial Home Exercises Provided  -  -  11/08/18        Exercise Comments: Exercise Comments    Row Name 10/23/18 1327 11/08/18 1423         Exercise Comments  Pt's first day of Cardiac Rehab today. Pt responded well to exercise prescription. Pt reported that he has been having low back pain. Pt did not report any problems with exercise today. Will continue to monitor and progress pt as tolerated.   Reviewed HEP with pt. Pt verbalized understanding. Will continue to follow up with pt regarding home exercise adherence.          Exercise Goals and Review: Exercise Goals    Row Name 10/17/18 1014             Exercise Goals   Increase Physical Activity  Yes       Intervention  Provide advice, education, support and counseling about physical activity/exercise needs.;Develop an individualized exercise prescription for aerobic and  resistive training based on initial evaluation findings, risk stratification, comorbidities  and participant's personal goals.       Expected Outcomes  Short Term: Attend rehab on a regular basis to increase amount of physical activity.       Increase Strength and Stamina  Yes       Intervention  Provide advice, education, support and counseling about physical activity/exercise needs.;Develop an individualized exercise prescription for aerobic and resistive training based on initial evaluation findings, risk stratification, comorbidities and participant's personal goals.       Expected Outcomes  Short Term: Increase workloads from initial exercise prescription for resistance, speed, and METs.       Able to understand and use rate of perceived exertion (RPE) scale  Yes       Intervention  Provide education and explanation on how to use RPE scale       Expected Outcomes  Short Term: Able to use RPE daily in rehab to express subjective intensity level;Long Term:  Able to use RPE to guide intensity level when exercising independently       Knowledge and understanding of Target Heart Rate Range (THRR)  Yes       Intervention  Provide education and explanation of THRR including how the numbers were predicted and where they are located for reference       Expected Outcomes  Short Term: Able to state/look up THRR;Long Term: Able to use THRR to govern intensity when exercising independently;Short Term: Able to use daily as guideline for intensity in rehab       Able to check pulse independently  Yes       Intervention  Provide education and demonstration on how to check pulse in carotid and radial arteries.;Review the importance of being able to check your own pulse for safety during independent exercise       Expected Outcomes  Short Term: Able to explain why pulse checking is important during independent exercise       Understanding of Exercise Prescription  Yes       Intervention  Provide education,  explanation, and written materials on patient's individual exercise prescription       Expected Outcomes  Short Term: Able to explain program exercise prescription;Long Term: Able to explain home exercise prescription to exercise independently          Exercise Goals Re-Evaluation : Exercise Goals Re-Evaluation    Row Name 11/08/18 1426             Exercise Goal Re-Evaluation   Exercise Goals Review  Increase Physical Activity;Able to understand and use rate of perceived exertion (RPE) scale;Knowledge and understanding of Target Heart Rate Range (THRR);Understanding of Exercise Prescription;Increase Strength and Stamina;Able to check pulse independently       Comments  Reviewed HEP with pt. Also reviewed THRR, RPE Scale, weather conditions, endpoints of exercise, NTG use, warmup and cool down.        Expected Outcomes  Pt will continue to exercise at local [place] 2-3 days a week, 30-45 minutes. Pt will continue to increase cardiorespiratory and muscular fitness. Will continue to monitor.           Discharge Exercise Prescription (Final Exercise Prescription Changes): Exercise Prescription Changes - 11/13/18 1410      Response to Exercise   Blood Pressure (Admit)  108/66    Blood Pressure (Exercise)  138/62    Blood Pressure (Exit)  102/68    Heart Rate (Admit)  68 bpm    Heart Rate (Exercise)  95 bpm  Heart Rate (Exit)  60 bpm    Rating of Perceived Exertion (Exercise)  12    Perceived Dyspnea (Exercise)  0    Symptoms  None    Comments  Added Treadmill to Exercise Prescription     Duration  Progress to 30 minutes of  aerobic without signs/symptoms of physical distress    Intensity  THRR unchanged      Progression   Progression  Continue to progress workloads to maintain intensity without signs/symptoms of physical distress.    Average METs  3.8      Resistance Training   Training Prescription  Yes    Weight  5lbs    Reps  10-15    Time  10 Minutes       Interval Training   Interval Training  No      Treadmill   MPH  2.8    Grade  2    Minutes  10    METs  3.91      Bike   Level  1.2    Minutes  10    METs  3.66      NuStep   Level  5    SPM  95    Minutes  10    METs  3.8      Track   Laps  --    Minutes  --    METs  --      Home Exercise Plan   Plans to continue exercise at  [facility] (comment)   [employer]   Frequency  Add 2 additional days to program exercise sessions.    Initial Home Exercises Provided  11/08/18       Nutrition:  Target Goals: Understanding of nutrition guidelines, daily intake of sodium 1500mg , cholesterol 200mg , calories 30% from fat and 7% or less from saturated fats, daily to have 5 or more servings of fruits and vegetables.  Biometrics: Pre Biometrics - 10/17/18 0908      Pre Biometrics   Height  5' 9.25" (1.759 m)    Weight  86.8 kg    Waist Circumference  40.5 inches    Hip Circumference  40 inches    Waist to Hip Ratio  1.01 %    BMI (Calculated)  28.05    Triceps Skinfold  28 mm    % Body Fat  30.4 %    Grip Strength  37 kg    Flexibility  8 in    Single Leg Stand  7 seconds        Nutrition Therapy Plan and Nutrition Goals: Nutrition Therapy & Goals - 10/17/18 0918      Nutrition Therapy   Diet  heart healthy      Personal Nutrition Goals   Nutrition Goal  Pt to identify and limit food sources of saturated fat, trans fat, refined carbohydrates and sodium    Personal Goal #2  pt to identify food quantities necessary to achieve weight loss of 6-24 lbs at graduation from cardiac rehab. Goal weight of less than 180 lbs desired      Intervention Plan   Intervention  Prescribe, educate and counsel regarding individualized specific dietary modifications aiming towards targeted core components such as weight, hypertension, lipid management, diabetes, heart failure and other comorbidities.    Expected Outcomes  Short Term Goal: Understand basic principles of dietary  content, such as calories, fat, sodium, cholesterol and nutrients.;Long Term Goal: Adherence to prescribed nutrition plan.  Nutrition Assessments: Nutrition Assessments - 10/17/18 0920      MEDFICTS Scores   Pre Score  18       Nutrition Goals Re-Evaluation:   Nutrition Goals Re-Evaluation:   Nutrition Goals Discharge (Final Nutrition Goals Re-Evaluation):   Psychosocial: Target Goals: Acknowledge presence or absence of significant depression and/or stress, maximize coping skills, provide positive support system. Participant is able to verbalize types and ability to use techniques and skills needed for reducing stress and depression.  Initial Review & Psychosocial Screening: Initial Psych Review & Screening - 10/17/18 0853      Initial Review   Current issues with  None Identified      Family Dynamics   Good Support System?  Yes      Barriers   Psychosocial barriers to participate in program  There are no identifiable barriers or psychosocial needs.      Screening Interventions   Interventions  Encouraged to exercise       Quality of Life Scores: Quality of Life - 10/17/18 0854      Quality of Life   Select  Quality of Life      Quality of Life Scores   Health/Function Pre  23.5 %    Socioeconomic Pre  26 %    Psych/Spiritual Pre  24.6 %    Family Pre  27.6 %    GLOBAL Pre  24.9 %      Scores of 19 and below usually indicate a poorer quality of life in these areas.  A difference of  2-3 points is a clinically meaningful difference.  A difference of 2-3 points in the total score of the Quality of Life Index has been associated with significant improvement in overall quality of life, self-image, physical symptoms, and general health in studies assessing change in quality of life.  PHQ-9: Recent Review Flowsheet Data    Depression screen [facility] 2/9 10/23/2018 08/12/2017 12/01/2015   Decreased Interest 0 0 0   Down, Depressed, Hopeless 0 0 0   PHQ - 2 Score 0  0 0     Interpretation of Total Score  Total Score Depression Severity:  1-4 = Minimal depression, 5-9 = Mild depression, 10-14 = Moderate depression, 15-19 = Moderately severe depression, 20-27 = Severe depression   Psychosocial Evaluation and Intervention: Psychosocial Evaluation - 10/23/18 1213      Psychosocial Evaluation & Interventions   Interventions  Encouraged to exercise with the program and follow exercise prescription    Comments  No pyschosocial needs identified. No intervention necessary.  Arlander enjoys Marketing executive.     Expected Outcomes  [place] will continue to exhibit a positive outlook with good coping skills.     Continue Psychosocial Services   No Follow up required       Psychosocial Re-Evaluation: Psychosocial Re-Evaluation    [place] Name 11/16/18 1226             Psychosocial Re-Evaluation   Current issues with  None Identified       Comments  No psychosocial needs identified. No interventions.        Expected Outcomes  [place] will maintain a positive outlook with good coping skills.        Interventions  Encouraged to attend Cardiac Rehabilitation for the exercise       Continue Psychosocial Services   No Follow up required          Psychosocial Discharge (Final Psychosocial Re-Evaluation): Psychosocial Re-Evaluation - 11/16/18 1226  Psychosocial Re-Evaluation   Current issues with  None Identified    Comments  No psychosocial needs identified. No interventions.     Expected Outcomes  Lamaj will maintain a positive outlook with good coping skills.     Interventions  Encouraged to attend Cardiac Rehabilitation for the exercise    Continue Psychosocial Services   No Follow up required       Vocational Rehabilitation: Provide vocational rehab assistance to qualifying candidates.   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 Participant will state understanding/return demonstration of topics presented.  Learning Barriers/Preferences: Learning Barriers/Preferences - 10/17/18 0941      Learning Barriers/Preferences   Learning Barriers  Sight   Loss of vision left eye   Learning Preferences  Written Material       Education Topics: Count Your Pulse:  -Group instruction provided by verbal instruction, demonstration, patient participation and written materials to support subject.  Instructors address importance of being able to find your pulse and how to count your pulse when at home without a heart monitor.  Patients get hands on experience counting their pulse with staff help and individually.   Heart Attack, Angina, and Risk Factor Modification:  -Group instruction provided by verbal instruction, video, and written materials to support subject.  Instructors address signs and symptoms of angina and heart attacks.    Also discuss risk factors for heart disease and how to make changes to improve heart health risk factors.   Functional Fitness:  -Group instruction provided by verbal instruction, demonstration, patient participation, and written materials to support subject.  Instructors address safety measures for doing things around the house.  Discuss how to get up and down off the floor, how to pick things up properly, how to safely get out of a chair without assistance, and balance training.   CARDIAC REHAB PHASE II EXERCISE from 11/10/2018 in [place]  Date  11/10/18  Instruction Review Code  3- Needs Reinforcement [Arrived late, missed first part of class.]      Meditation and Mindfulness:  -Group instruction provided by verbal instruction, patient participation, and written materials to support subject.  Instructor  addresses importance of mindfulness and meditation practice to help reduce stress and improve awareness.  Instructor also leads participants through a meditation exercise.    Stretching for Flexibility and Mobility:  -Group instruction provided by verbal instruction, patient participation, and written materials to support subject.  Instructors lead participants through series of stretches that are designed to increase flexibility thus improving mobility.  These stretches are additional exercise for major muscle groups that are typically performed during regular warm up and cool down.   Hands Only CPR:  -Group verbal, video, and participation provides a basic overview of AHA guidelines for community CPR. Role-play of emergencies allow participants the opportunity to practice calling for help and chest compression technique with discussion of AED use.   Hypertension: -Group verbal and written instruction that provides a basic overview of hypertension including the most recent diagnostic guidelines, risk factor reduction with self-care instructions and medication management.    Nutrition I class: Heart Healthy Eating:  -Group instruction provided by PowerPoint slides, verbal discussion, and written materials  to support subject matter. The instructor gives an explanation and review of the Therapeutic Lifestyle Changes diet recommendations, which includes a discussion on lipid goals, dietary fat, sodium, fiber, plant stanol/sterol esters, sugar, and the components of a well-balanced, healthy diet.   Nutrition II class: Lifestyle Skills:  -Group instruction provided by PowerPoint slides, verbal discussion, and written materials to support subject matter. The instructor gives an explanation and review of label reading, grocery shopping for heart health, heart healthy recipe modifications, and ways to make healthier choices when eating out.   Diabetes Question & Answer:  -Group instruction provided  by PowerPoint slides, verbal discussion, and written materials to support subject matter. The instructor gives an explanation and review of diabetes co-morbidities, pre- and post-prandial blood glucose goals, pre-exercise blood glucose goals, signs, symptoms, and treatment of hypoglycemia and hyperglycemia, and foot care basics.   Diabetes Blitz:  -Group instruction provided by PowerPoint slides, verbal discussion, and written materials to support subject matter. The instructor gives an explanation and review of the physiology behind type 1 and type 2 diabetes, diabetes medications and rational behind using different medications, pre- and post-prandial blood glucose recommendations and Hemoglobin A1c goals, diabetes diet, and exercise including blood glucose guidelines for exercising safely.    Portion Distortion:  -Group instruction provided by PowerPoint slides, verbal discussion, written materials, and food models to support subject matter. The instructor gives an explanation of serving size versus portion size, changes in portions sizes over the last 20 years, and what consists of a serving from each food group.   Stress Management:  -Group instruction provided by verbal instruction, video, and written materials to support subject matter.  Instructors review role of stress in heart disease and how to cope with stress positively.     Exercising on Your Own:  -Group instruction provided by verbal instruction, power point, and written materials to support subject.  Instructors discuss benefits of exercise, components of exercise, frequency and intensity of exercise, and end points for exercise.  Also discuss use of nitroglycerin and activating EMS.  Review options of places to exercise outside of rehab.  Review guidelines for sex with heart disease.   Cardiac Drugs I:  -Group instruction provided by verbal instruction and written materials to support subject.  Instructor reviews cardiac drug  classes: antiplatelets, anticoagulants, beta blockers, and statins.  Instructor discusses reasons, side effects, and lifestyle considerations for each drug class.   Cardiac Drugs II:  -Group instruction provided by verbal instruction and written materials to support subject.  Instructor reviews cardiac drug classes: angiotensin converting enzyme inhibitors (ACE-I), angiotensin II receptor blockers (ARBs), nitrates, and calcium channel blockers.  Instructor discusses reasons, side effects, and lifestyle considerations for each drug class.   Anatomy and Physiology of the Circulatory System:  Group verbal and written instruction and models provide basic cardiac anatomy and physiology, with the coronary electrical and arterial systems. Review of: AMI, Angina, Valve disease, Heart Failure, Peripheral Artery Disease, Cardiac Arrhythmia, Pacemakers, and the ICD.   Other Education:  -Group or individual verbal, written, or video instructions that support the educational goals of the cardiac rehab program.   Holiday Eating Survival Tips:  -Group instruction provided by PowerPoint slides, verbal discussion, and written materials to support subject matter. The instructor gives patients tips, tricks, and techniques to help them not only survive but enjoy the holidays despite the onslaught of food that accompanies the holidays.   Knowledge Questionnaire Score: Knowledge Questionnaire Score - 10/17/18 7062  Knowledge Questionnaire Score   Pre Score  21/24       Core Components/Risk Factors/Patient Goals at Admission: Personal Goals and Risk Factors at Admission - 10/17/18 0943      Core Components/Risk Factors/Patient Goals on Admission    Weight Management  Yes;Weight Loss;Weight Maintenance    Intervention  Weight Management: Develop a combined nutrition and exercise program designed to reach desired caloric intake, while maintaining appropriate intake of nutrient and fiber, sodium and fats,  and appropriate energy expenditure required for the weight goal.;Weight Management: Provide education and appropriate resources to help participant work on and attain dietary goals.;Weight Management/Obesity: Establish reasonable short term and long term weight goals.    Admit Weight  191 lb 5.8 oz (86.8 kg)    Expected Outcomes  Short Term: Continue to assess and modify interventions until short term weight is achieved;Long Term: Adherence to nutrition and physical activity/exercise program aimed toward attainment of established weight goal;Weight Maintenance: Understanding of the daily nutrition guidelines, which includes 25-35% calories from fat, 7% or less cal from saturated fats, less than 200mg  cholesterol, less than 1.5gm of sodium, & 5 or more servings of fruits and vegetables daily;Weight Loss: Understanding of general recommendations for a balanced deficit meal plan, which promotes 1-2 lb weight loss per week and includes a negative energy balance of [phone] kcal/d;Understanding recommendations for meals to include 15-35% energy as protein, 25-35% energy from fat, 35-60% energy from carbohydrates, less than 200mg  of dietary cholesterol, 20-35 gm of total fiber daily;Understanding of distribution of calorie intake throughout the day with the consumption of 4-5 meals/snacks    Hypertension  Yes    Intervention  Provide education on lifestyle modifcations including regular physical activity/exercise, weight management, moderate sodium restriction and increased consumption of fresh fruit, vegetables, and low fat dairy, alcohol moderation, and smoking cessation.;Monitor prescription use compliance.    Expected Outcomes  Short Term: Continued assessment and intervention until BP is < 140/52mm HG in hypertensive participants. < 130/58mm HG in hypertensive participants with diabetes, heart failure or chronic kidney disease.;Long Term: Maintenance of blood pressure at goal levels.    Lipids  Yes     Intervention  Provide education and support for participant on nutrition & aerobic/resistive exercise along with prescribed medications to achieve LDL 70mg , HDL >40mg .    Expected Outcomes  Short Term: Participant states understanding of desired cholesterol values and is compliant with medications prescribed. Participant is following exercise prescription and nutrition guidelines.;Long Term: Cholesterol controlled with medications as prescribed, with individualized exercise RX and with personalized nutrition plan. Value goals: LDL < 70mg , HDL > 40 mg.    Personal Goal Other  Yes    Personal Goal  Pt wants to get back to his usual gym routine of interval training and using the elliptical.        Core Components/Risk Factors/Patient Goals Review:  Goals and Risk Factor Review    Row Name 10/23/18 1228 11/16/18 1517           Core Components/Risk Factors/Patient Goals Review   Personal Goals Review  Weight Management/Obesity;Lipids;Hypertension;Other  Weight Management/Obesity;Lipids;Hypertension;Other      Review  Pt willing to participate in CR exercise.  Erma would like to get back to his routine that he has been doing at the gym.  Pt willing to participate in CR exercise.  Garreth is tolerating exercise well.  He feels that he is increasing his strength.       Expected Outcomes  Pt will continue to  participate in CR exercise, nutrition, and lifestyle modification opportunities.   Pt will continue to participate in CR exercise, nutrition, and lifestyle modification opportunities.          Core Components/Risk Factors/Patient Goals at Discharge (Final Review):  Goals and Risk Factor Review - 11/16/18 1517      Core Components/Risk Factors/Patient Goals Review   Personal Goals Review  Weight Management/Obesity;Lipids;Hypertension;Other    Review  Pt willing to participate in CR exercise.  Khalil is tolerating exercise well.  He feels that he is increasing his strength.     Expected Outcomes  Pt  will continue to participate in CR exercise, nutrition, and lifestyle modification opportunities.        ITP Comments: ITP Comments    Row Name 10/17/18 0810 10/23/18 1213 11/16/18 1224       ITP Comments  Dr. Fransico Him, Medical Director   30 Day ITP Review. Pt started exercise today and tolerared it well.   30 Day ITP Review. Leaman continues to do well with exercise.  He feels he is increasing his strength.         Comments: See ITP Comments.

## 2018-11-17 ENCOUNTER — Ambulatory Visit: Payer: Medicare Other | Admitting: Cardiovascular Disease

## 2018-11-17 ENCOUNTER — Encounter (HOSPITAL_COMMUNITY): Payer: Medicare Other

## 2018-11-17 ENCOUNTER — Encounter (HOSPITAL_COMMUNITY)
Admission: RE | Admit: 2018-11-17 | Discharge: 2018-11-17 | Disposition: A | Discharge status: Home / Self Care | Payer: Medicare Other | Source: Ambulatory Visit | Attending: Cardiovascular Disease | Admitting: Cardiovascular Disease

## 2018-11-17 DIAGNOSIS — Z955 Presence of coronary angioplasty implant and graft: Secondary | ICD-10-CM | POA: Diagnosis not present

## 2018-11-17 DIAGNOSIS — E785 Hyperlipidemia, unspecified: Secondary | ICD-10-CM | POA: Diagnosis not present

## 2018-11-17 DIAGNOSIS — Z79899 Other long term (current) drug therapy: Secondary | ICD-10-CM | POA: Diagnosis not present

## 2018-11-19 ENCOUNTER — Encounter: Payer: Self-pay | Admitting: Family Medicine

## 2018-11-19 NOTE — Progress Notes (Signed)
HPI:  Using dictation device. Unfortunately this device frequently misinterprets words/phrases.  Ronnie Vazquez is a pleasant 72 y.o. here for follow up. Chronic medical problems summarized below were reviewed for changes.  Reports doing well overall. Had stents placed with cardiology. Has changed diet significantly and is reducing weight and working on healthier lifestyle.   Reports Acid reflux issues for some time now since starting plavix. ER evaluation and was told was not his heart. symptoms mainly at night and after meals. Heartburn. No vomiting, nausea, dyspnia. Will see his cardiologist next week. Taking pepcid  -helps some but still milder symptoms almost daily. Denies SOB, DOE, exertional symptoms, treatment intolerance or new symptoms. New complaint of tinnitus. Long term. Much worse the last few months. Bilat. No sig hearing changes. Hx poor compliance.  Due for AWV.  CAD/HLD: -seeing cardiology for management -s/p PCI/DES 08/2018 -did not tolerate several statins (muscle pain) -meds: asa, repatha, plavix -also hx mild Carotid art dz, ? TIA  GERD: -meds: pepcid  Allergic rhinitis: -meds zyrtec  ROS: See pertinent positives and negatives per HPI.  Past Medical History:  Diagnosis Date  . Chicken pox    hx  . Coronary artery disease    08/29/18 PCI/DES with bifurcation from LAD into 1st diag.   Marland Kitchen Hyperlipidemia   . Left shoulder pain 02/02/2013  . Microscopic hematuria 02/02/2013  . Vision loss of left eye     Past Surgical History:  Procedure Laterality Date  . COLONOSCOPY    . CORONARY BALLOON ANGIOPLASTY N/A 08/29/2018   Procedure: CORONARY BALLOON ANGIOPLASTY;  Surgeon: Sherren Mocha, MD;  Location: [facility] [place] [facility];  Service: Cardiovascular;  Laterality: N/A;  . CORONARY STENT INTERVENTION N/A 08/29/2018   Procedure: CORONARY STENT INTERVENTION;  Surgeon: Sherren Mocha, MD;  Location: [place] [facility];  Service: Cardiovascular;  Laterality: N/A;  . LEFT HEART  CATH AND CORONARY ANGIOGRAPHY N/A 08/08/2018   Procedure: LEFT HEART CATH AND CORONARY ANGIOGRAPHY;  Surgeon: Lorretta Harp, MD;  Location: [place] [facility];  Service: Cardiovascular;  Laterality: N/A;  . TONSILLECTOMY AND ADENOIDECTOMY  1959    Family History  Problem Relation Age of Onset  . Prostate cancer Father   . Colon cancer Neg Hx   . Esophageal cancer Neg Hx   . Pancreatic cancer Neg Hx   . Rectal cancer Neg Hx   . Stomach cancer Neg Hx   . Heart disease Neg Hx     SOCIAL HX: se ehpi   Current Outpatient Medications:  .  acetaminophen (TYLENOL) 325 MG tablet, Take 650 mg by mouth at bedtime as needed (for sleep). , Disp: , Rfl:  .  aspirin 81 MG tablet, Take 81 mg by mouth daily., Disp: , Rfl:  .  cetirizine (ZYRTEC) 10 MG tablet, Take 10 mg by mouth daily as needed for allergies., Disp: , Rfl:  .  clopidogrel (PLAVIX) 75 MG tablet, Take 1 tablet (75 mg total) by mouth daily., Disp: 90 tablet, Rfl: 3 .  Evolocumab (REPATHA SURECLICK) 355 MG/ML SOAJ, Inject 140 mg into the skin every 14 (fourteen) days., Disp: 2 pen, Rfl: 11 .  famotidine (PEPCID) 10 MG tablet, Take 1 tablet (10 mg total) by mouth 2 (two) times daily., Disp: 60 tablet, Rfl: 0 .  nitroGLYCERIN (NITROSTAT) 0.4 MG SL tablet, Place 1 tablet (0.4 mg total) under the tongue every 5 (five) minutes as needed for chest pain., Disp: 25 tablet, Rfl: 3  EXAM:  Vitals:  11/21/18 0904  BP: 100/60  Pulse: 66  Temp: (!) 97.4 F (36.3 C)  SpO2: 98%    Body mass index is 27.97 kg/m.  GENERAL: vitals reviewed and listed above, alert, oriented, appears well hydrated and in no acute distress  HEENT: atraumatic, conjunttiva clear, no obvious abnormalities on inspection of external nose and ears, ear canals and TMs normal today - no effusion.   NECK: no obvious masses on inspection  LUNGS: clear to auscultation bilaterally, no wheezes, rales or rhonchi, good air movement  CV: HRRR, no peripheral edema  MS:  moves all extremities without noticeable abnormality  PSYCH: pleasant and cooperative, no obvious depression or anxiety  ASSESSMENT AND PLAN:  Discussed the following assessment and plan:  Gastroesophageal reflux disease, esophagitis presence not specified  Overweight (BMI 25.0-29.9)  Tinnitus of both ears  Bilateral carotid artery disease, unspecified type ([place]), Chronic  -discussed various treatment options for GERD - he will talk with cardiologist about interaction with PPI and plavix. Also needs to discuss symptoms with cardiologist regardless - though seems more likely GERD related. -wants to try dietary changes in interim - handout given -possible GI eval if persists -advised ENT eval - he plans to call to schedule -lifetsyle recs for continued wt reduction and reduction CV risks -Patient advised to return or notify a doctor immediately if symptoms worsen or persist or new concerns arise.  Patient Instructions  BEFORE YOU LEAVE: -follow up: for AWV as planned - fasting labs then  Try the dietary changes for acid reflux.  Let me know if 4 weeks if still having frequent reflux and would advise GI evaluation  See the ear nose and throat doctor about the ringing in the ears  Eat a healthy [place] and get regular exercise   Food Choices for Gastroesophageal Reflux Disease, Adult When you have gastroesophageal reflux disease (GERD), the foods you eat and your eating habits are very important. Choosing the right foods can help ease your discomfort. What guidelines do I need to follow?  Choose fruits, vegetables, whole grains, and low-fat dairy products.  Choose low-fat meat, fish, and poultry.  Limit fats such as oils, salad dressings, butter, nuts, and avocado.  Keep a food diary. This helps you identify foods that cause symptoms.  Avoid foods that cause symptoms. These may be different for everyone.  Eat small meals often instead of 3 large meals a  day.  Eat your meals slowly, in a place where you are relaxed.  Limit fried foods.  Cook foods using methods other than frying.  Avoid drinking alcohol.  Avoid drinking large amounts of liquids with your meals.  Avoid bending over or lying down until 2-3 hours after eating. What foods are not recommended? These are some foods and drinks that may make your symptoms worse: Vegetables Tomatoes. Tomato juice. Tomato and spaghetti sauce. Chili peppers. Onion and garlic. Horseradish. Fruits Oranges, grapefruit, and lemon (fruit and juice). Meats High-fat meats, fish, and poultry. This includes hot dogs, ribs, ham, sausage, salami, and bacon. Dairy Whole milk and chocolate milk. Sour cream. Cream. Butter. Ice cream. Cream cheese. Drinks Coffee and tea. Bubbly (carbonated) drinks or energy drinks. Condiments Hot sauce. Barbecue sauce. Sweets/Desserts Chocolate and cocoa. Donuts. Peppermint and spearmint. Fats and Oils High-fat foods. This includes [place] fries and potato chips. Other Vinegar. Strong spices. This includes black pepper, white pepper, red pepper, cayenne, curry powder, cloves, ginger, and chili powder. The items listed above may not be a complete list of  foods and drinks to avoid. Contact your dietitian for more information. This information is not intended to replace advice given to you by your health care provider. Make sure you discuss any questions you have with your health care provider. Document Released: 06/13/2012 Document Revised: 05/20/2016 Document Reviewed: 10/17/2013 Elsevier Interactive Patient Education  2017 [employer], DO

## 2018-11-20 ENCOUNTER — Encounter (HOSPITAL_COMMUNITY)
Admission: RE | Admit: 2018-11-20 | Discharge: 2018-11-20 | Disposition: A | Discharge status: Home / Self Care | Payer: Medicare Other | Source: Ambulatory Visit | Attending: Cardiovascular Disease | Admitting: Cardiovascular Disease

## 2018-11-20 ENCOUNTER — Encounter (HOSPITAL_COMMUNITY): Payer: Medicare Other

## 2018-11-20 DIAGNOSIS — E785 Hyperlipidemia, unspecified: Secondary | ICD-10-CM | POA: Diagnosis not present

## 2018-11-20 DIAGNOSIS — Z955 Presence of coronary angioplasty implant and graft: Secondary | ICD-10-CM | POA: Diagnosis not present

## 2018-11-20 DIAGNOSIS — Z79899 Other long term (current) drug therapy: Secondary | ICD-10-CM | POA: Diagnosis not present

## 2018-11-21 ENCOUNTER — Ambulatory Visit (INDEPENDENT_AMBULATORY_CARE_PROVIDER_SITE_OTHER): Payer: Medicare Other | Admitting: Family Medicine

## 2018-11-21 ENCOUNTER — Encounter: Payer: Self-pay | Admitting: Family Medicine

## 2018-11-21 VITALS — BP 100/60 | HR 66 | Temp 97.4°F | Ht 69.25 in | Wt 190.8 lb

## 2018-11-21 DIAGNOSIS — I779 Disorder of arteries and arterioles, unspecified: Secondary | ICD-10-CM

## 2018-11-21 DIAGNOSIS — E663 Overweight: Secondary | ICD-10-CM | POA: Diagnosis not present

## 2018-11-21 DIAGNOSIS — K219 Gastro-esophageal reflux disease without esophagitis: Secondary | ICD-10-CM | POA: Diagnosis not present

## 2018-11-21 DIAGNOSIS — I739 Peripheral vascular disease, unspecified: Secondary | ICD-10-CM | POA: Diagnosis not present

## 2018-11-21 DIAGNOSIS — I2510 Atherosclerotic heart disease of native coronary artery without angina pectoris: Secondary | ICD-10-CM

## 2018-11-21 DIAGNOSIS — H9313 Tinnitus, bilateral: Secondary | ICD-10-CM

## 2018-11-21 NOTE — Patient Instructions (Addendum)
BEFORE YOU LEAVE: -follow up: for AWV as planned - fasting labs then  Try the dietary changes for acid reflux.  Let me know if 4 weeks if still having frequent reflux and would advise GI evaluation  See the ear nose and throat doctor about the ringing in the ears  Eat a healthy [place] and get regular exercise   Food Choices for Gastroesophageal Reflux Disease, Adult When you have gastroesophageal reflux disease (GERD), the foods you eat and your eating habits are very important. Choosing the right foods can help ease your discomfort. What guidelines do I need to follow?  Choose fruits, vegetables, whole grains, and low-fat dairy products.  Choose low-fat meat, fish, and poultry.  Limit fats such as oils, salad dressings, butter, nuts, and avocado.  Keep a food diary. This helps you identify foods that cause symptoms.  Avoid foods that cause symptoms. These may be different for everyone.  Eat small meals often instead of 3 large meals a day.  Eat your meals slowly, in a place where you are relaxed.  Limit fried foods.  Cook foods using methods other than frying.  Avoid drinking alcohol.  Avoid drinking large amounts of liquids with your meals.  Avoid bending over or lying down until 2-3 hours after eating. What foods are not recommended? These are some foods and drinks that may make your symptoms worse: Vegetables Tomatoes. Tomato juice. Tomato and spaghetti sauce. Chili peppers. Onion and garlic. Horseradish. Fruits Oranges, grapefruit, and lemon (fruit and juice). Meats High-fat meats, fish, and poultry. This includes hot dogs, ribs, ham, sausage, salami, and bacon. Dairy Whole milk and chocolate milk. Sour cream. Cream. Butter. Ice cream. Cream cheese. Drinks Coffee and tea. Bubbly (carbonated) drinks or energy drinks. Condiments Hot sauce. Barbecue sauce. Sweets/Desserts Chocolate and cocoa. Donuts. Peppermint and spearmint. Fats and  Oils High-fat foods. This includes [place] fries and potato chips. Other Vinegar. Strong spices. This includes black pepper, white pepper, red pepper, cayenne, curry powder, cloves, ginger, and chili powder. The items listed above may not be a complete list of foods and drinks to avoid. Contact your dietitian for more information. This information is not intended to replace advice given to you by your health care provider. Make sure you discuss any questions you have with your health care provider. Document Released: 06/13/2012 Document Revised: 05/20/2016 Document Reviewed: 10/17/2013 Elsevier Interactive Patient Education  2017 [employer].

## 2018-11-22 ENCOUNTER — Encounter (HOSPITAL_COMMUNITY)
Admission: RE | Admit: 2018-11-22 | Discharge: 2018-11-22 | Disposition: A | Discharge status: Home / Self Care | Payer: Medicare Other | Source: Ambulatory Visit | Attending: Cardiovascular Disease | Admitting: Cardiovascular Disease

## 2018-11-22 ENCOUNTER — Encounter (HOSPITAL_COMMUNITY): Payer: Medicare Other

## 2018-11-22 DIAGNOSIS — Z955 Presence of coronary angioplasty implant and graft: Secondary | ICD-10-CM

## 2018-11-22 DIAGNOSIS — E785 Hyperlipidemia, unspecified: Secondary | ICD-10-CM | POA: Diagnosis not present

## 2018-11-22 DIAGNOSIS — Z79899 Other long term (current) drug therapy: Secondary | ICD-10-CM | POA: Diagnosis not present

## 2018-11-22 NOTE — Progress Notes (Signed)
Ronnie Vazquez 72 y.o. male DOB: June 01, 1946 MRN: [id number]      Nutrition Note  1. 08/29/2018 Stented coronary artery    Past Medical History:  Diagnosis Date  . Chicken pox    hx  . Coronary artery disease    08/29/18 PCI/DES with bifurcation from LAD into 1st diag.   Marland Kitchen Hyperlipidemia   . Left shoulder pain 02/02/2013  . Microscopic hematuria 02/02/2013  . Vision loss of left eye    Meds reviewed.    Current Outpatient Medications (Cardiovascular):  Marland Kitchen  Evolocumab (REPATHA SURECLICK) 295 MG/ML SOAJ, Inject 140 mg into the skin every 14 (fourteen) days. .  nitroGLYCERIN (NITROSTAT) 0.4 MG SL tablet, Place 1 tablet (0.4 mg total) under the tongue every 5 (five) minutes as needed for chest pain.  Current Outpatient Medications (Respiratory):  .  cetirizine (ZYRTEC) 10 MG tablet, Take 10 mg by mouth daily as needed for allergies.  Current Outpatient Medications (Analgesics):  .  acetaminophen (TYLENOL) 325 MG tablet, Take 650 mg by mouth at bedtime as needed (for sleep).  Marland Kitchen  aspirin 81 MG tablet, Take 81 mg by mouth daily.  Current Outpatient Medications (Hematological):  .  clopidogrel (PLAVIX) 75 MG tablet, Take 1 tablet (75 mg total) by mouth daily.  Current Outpatient Medications (Other):  .  famotidine (PEPCID) 10 MG tablet, Take 1 tablet (10 mg total) by mouth 2 (two) times daily.   HT: Ht Readings from Last 1 Encounters:  11/21/18 5' 9.25" (1.759 m)    WT: Wt Readings from Last 5 Encounters:  11/21/18 190 lb 12.8 oz (86.5 kg)  10/17/18 191 lb 5.8 oz (86.8 kg)  10/10/18 193 lb (87.5 kg)  10/06/18 193 lb (87.5 kg)  10/06/18 188 lb (85.3 kg)     There is no height or weight on file to calculate BMI.   Current tobacco use? No  Labs:  Lipid Panel     Component Value Date/Time   CHOL 145 09/29/2018 0000   TRIG 157 (H) 09/29/2018 0000   HDL 39 (L) 09/29/2018 0000   CHOLHDL 3.7 09/29/2018 0000   CHOLHDL 5 08/12/2017 0921   VLDL 60.2 (H) 08/12/2017 0921   LDLCALC 75  09/29/2018 0000   LDLDIRECT 117.0 08/12/2017 0921    Lab Results  Component Value Date   HGBA1C 5.4 08/10/2018   CBG (last 3)  No results for input(s): GLUCAP in the last 72 hours.  Nutrition Note Spoke with pt. Nutrition plan and goals reviewed with pt. Pt is following heart healthy diet. Pt wants to lose wt. Pt has been trying to lose wt by avoiding fried foods, avoiding fatty cuts of red meat. Wt loss tips reviewed (label reading, how to build a healthy plate, portion sizes, eating frequently across the day). Pt shares that he works in [employer]. Tues, Weds, Thursday every week. When he is gone for these 3 days eats out 3x a day. Discussed how to navigate eating out, to make options more heart healthy when out of town. Per discussion, pt does not not use canned/convenience foods often. Pt does not add salt to food. Patient had questions about prebiotics, reviewed prebiotic foods to try, and discussed using a NSF or USP approved product as an alternative. Pt expressed understanding of the information reviewed. Pt aware of nutrition education classes offered and would like wife to attend as he is out of town working.  Nutrition Diagnosis  Food-and nutrition-related knowledge deficit related to lack of exposure to  information as related to diagnosis of: ? CVD   Overweight  related to excessive energy intake as evidenced by a Body mass index is 28.06 kg/m.  Nutrition Intervention ? Pt's individual nutrition plan and goals reviewed with pt.  Nutrition Goal(s):   Pt to identify and limit food sources of saturated fat, trans fat, refined carbohydrates and sodium  Pt to identify food quantities necessary to achieve weight loss of 6-24 lbs. at graduation from cardiac rehab. Goal wt of less than 180 lb desired, but pt shared he would not like to lose weight at the cost of his muscle composition.  Pt to identify and try probiotics   Plan:  ? Pt to attend nutrition classes ? Nutrition I  ? Nutrition II ? Portion Distortion  ? Will provide client-centered nutrition education as part of interdisciplinary care ? Monitor and evaluate progress toward nutrition goal with team.   Ronnie Bustle, MS, RD, LDN 11/22/2018 12:05 PM

## 2018-11-24 ENCOUNTER — Encounter (HOSPITAL_COMMUNITY): Payer: Medicare Other

## 2018-11-27 ENCOUNTER — Encounter (HOSPITAL_COMMUNITY)
Admission: RE | Admit: 2018-11-27 | Discharge: 2018-11-27 | Disposition: A | Discharge status: Home / Self Care | Payer: Medicare Other | Source: Ambulatory Visit | Attending: Cardiovascular Disease | Admitting: Cardiovascular Disease

## 2018-11-27 ENCOUNTER — Encounter: Payer: Self-pay | Admitting: Cardiovascular Disease

## 2018-11-27 ENCOUNTER — Encounter (HOSPITAL_COMMUNITY): Payer: Medicare Other

## 2018-11-27 ENCOUNTER — Ambulatory Visit (INDEPENDENT_AMBULATORY_CARE_PROVIDER_SITE_OTHER): Payer: Medicare Other | Admitting: Cardiovascular Disease

## 2018-11-27 VITALS — BP 106/78 | HR 68 | Ht 69.25 in | Wt 188.8 lb

## 2018-11-27 DIAGNOSIS — E782 Mixed hyperlipidemia: Secondary | ICD-10-CM

## 2018-11-27 DIAGNOSIS — E785 Hyperlipidemia, unspecified: Secondary | ICD-10-CM | POA: Diagnosis not present

## 2018-11-27 DIAGNOSIS — Z955 Presence of coronary angioplasty implant and graft: Secondary | ICD-10-CM

## 2018-11-27 DIAGNOSIS — I2510 Atherosclerotic heart disease of native coronary artery without angina pectoris: Secondary | ICD-10-CM

## 2018-11-27 DIAGNOSIS — Z79899 Other long term (current) drug therapy: Secondary | ICD-10-CM | POA: Diagnosis not present

## 2018-11-27 MED ORDER — PANTOPRAZOLE SODIUM 40 MG PO TBEC: 40.0000 mg | DELAYED_RELEASE_TABLET | Freq: Every day | ORAL | 3 refills | Status: DC

## 2018-11-27 NOTE — Progress Notes (Signed)
Cardiology Office Note:    Date:  11/27/2018   ID:  Ronnie Vazquez, DOB [birth year], MRN [id number]  PCP:  Lucretia Kern, DO  Cardiologist:  Sherren Mocha, MD  Electrophysiologist:  None   Referring MD: Lucretia Kern, DO   Chief Complaint  Patient presents with  . Coronary Artery Disease    History of Present Illness:    Ronnie Vazquez is a 72 y.o. male with a hx of coronary artery disease, presented for follow-up evaluation.  The patient has a history of exertional angina and was found to have severe LAD/first diagonal bifurcation disease.  He was considered for complex PCI versus CABG.  He ultimately was treated with PCI of the LAD and balloon angioplasty of the diagonal August 29, 2018.  The patient is here alone today.  He has not had any exertional symptoms and he continues to participate in cardiac rehab.  He brings in flowsheets from cardiac rehab demonstrating good blood pressure control.  He has had some problems with acid reflux.  He describes burning chest discomfort about 2 hours after eating.  He has not had any exertional chest discomfort.  He has started taking Pepcid with some improvement but continues to have symptoms.  He denies shortness of breath, edema, or heart palpitations.  Past Medical History:  Diagnosis Date  . Chicken pox    hx  . Coronary artery disease    08/29/18 PCI/DES with bifurcation from LAD into 1st diag.   Marland Kitchen Hyperlipidemia   . Left shoulder pain 02/02/2013  . Microscopic hematuria 02/02/2013  . Vision loss of left eye     Past Surgical History:  Procedure Laterality Date  . COLONOSCOPY    . CORONARY BALLOON ANGIOPLASTY N/A 08/29/2018   Procedure: CORONARY BALLOON ANGIOPLASTY;  Surgeon: Sherren Mocha, MD;  Location: [place] [facility];  Service: Cardiovascular;  Laterality: N/A;  . CORONARY STENT INTERVENTION N/A 08/29/2018   Procedure: CORONARY STENT INTERVENTION;  Surgeon: Sherren Mocha, MD;  Location: [place] [facility];  Service:  Cardiovascular;  Laterality: N/A;  . LEFT HEART CATH AND CORONARY ANGIOGRAPHY N/A 08/08/2018   Procedure: LEFT HEART CATH AND CORONARY ANGIOGRAPHY;  Surgeon: Lorretta Harp, MD;  Location: [place] [facility];  Service: Cardiovascular;  Laterality: N/A;  . TONSILLECTOMY AND ADENOIDECTOMY  1959    Current Medications: Current Meds  Medication Sig  . acetaminophen (TYLENOL) 325 MG tablet Take 650 mg by mouth at bedtime as needed (for sleep).   Marland Kitchen aspirin 81 MG tablet Take 81 mg by mouth daily.  . cetirizine (ZYRTEC) 10 MG tablet Take 10 mg by mouth daily as needed for allergies.  Marland Kitchen clopidogrel (PLAVIX) 75 MG tablet Take 1 tablet (75 mg total) by mouth daily.  . Evolocumab (REPATHA SURECLICK) 852 MG/ML SOAJ Inject 140 mg into the skin every 14 (fourteen) days.  . nitroGLYCERIN (NITROSTAT) 0.4 MG SL tablet Place 1 tablet (0.4 mg total) under the tongue every 5 (five) minutes as needed for chest pain.  . [DISCONTINUED] famotidine (PEPCID) 10 MG tablet Take 1 tablet (10 mg total) by mouth 2 (two) times daily.     Allergies:   Patient has no known allergies.   Social History   Socioeconomic History  . Marital status: Married    Spouse name: Not on file  . Number of children: Not on file  . Years of education: Not on file  . Highest education level: Not on file  Occupational History  . Not  on file  Social Needs  . Financial resource strain: Not on file  . Food insecurity:    Worry: Not on file    Inability: Not on file  . Transportation needs:    Medical: Not on file    Non-medical: Not on file  Tobacco Use  . Smoking status: Never Smoker  . Smokeless tobacco: Never Used  Substance and Sexual Activity  . Alcohol use: Yes    Alcohol/week: 2.0 standard drinks    Types: 2 Glasses of wine per week  . Drug use: No  . Sexual activity: Not on file  Lifestyle  . Physical activity:    Days per week: Not on file    Minutes per session: Not on file  . Stress: Not on file    Relationships  . Social connections:    Talks on phone: Not on file    Gets together: Not on file    Attends religious service: Not on file    Active member of club or organization: Not on file    Attends meetings of clubs or organizations: Not on file    Relationship status: Not on file  Other Topics Concern  . 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 There is no history of Colon cancer, Esophageal cancer, Pancreatic cancer, Rectal cancer, Stomach cancer, or Heart disease.  ROS:   Please see the history of present illness.    Positive for hearing loss, tinnitus.  All other systems reviewed and are negative.  EKGs/Labs/Other Studies Reviewed:    The following studies were reviewed today: Cardiac catheterization/PCI: Conclusion   Successful bifurcation PCI with stenting of the proximal LAD using a 3.0x30 mm Resolute Onyx DES and angioplasty of the first diagonal (through the LAD stent struts) with a 3.0 mm balloon  Recommend uninterrupted dual antiplatelet therapy with Aspirin 81mg  daily and Clopidogrel 75mg  daily for a minimum of 12 months with bifurcation lesion.   Myoview stress test 08/04/2018: Study Highlights    The left ventricular ejection fraction is mildly decreased (45-54%).  Nuclear stress EF: 54%.  ST segment depression was noted during stress in the V5, V6, II, III, aVF and I leads.  Blood pressure demonstrated a hypotensive response to exercise.  Defect 1: There is a large defect of severe severity present in the mid anterior, mid anteroseptal, apical anterior, apical septal and apex location.  Findings consistent with ischemia.  This is a high risk study.   EKG:  EKG is not  ordered today.    Recent Labs: 09/29/2018: ALT 21 10/06/2018: BUN 24; Creatinine, Ser 1.28; Hemoglobin 14.5; Platelets 209; Potassium 4.1; Sodium 139  Recent Lipid Panel    Component Value Date/Time   CHOL 145 09/29/2018 0000   TRIG 157 (H) 09/29/2018 0000   HDL 39 (L) 09/29/2018 0000   CHOLHDL 3.7 09/29/2018 0000   CHOLHDL 5 08/12/2017 0921   VLDL 60.2 (H) 08/12/2017 0921   LDLCALC 75 09/29/2018 0000   LDLDIRECT 117.0 08/12/2017 0921    Physical Exam:    VS:  BP 106/78   Pulse 68  Ht 5' 9.25" (1.759 m)   Wt 188 lb 12.8 oz (85.6 kg)   SpO2 97%   BMI 27.68 kg/m     Wt Readings from Last 3 Encounters:  11/27/18 188 lb 12.8 oz (85.6 kg)  11/21/18 190 lb 12.8 oz (86.5 kg)  10/17/18 191 lb 5.8 oz (86.8 kg)     GEN:  Well nourished, well developed in no acute distress HEENT: Normal NECK: No JVD; No carotid bruits LYMPHATICS: No lymphadenopathy CARDIAC: RRR, no murmurs, rubs, gallops RESPIRATORY:  Clear to auscultation without rales, wheezing or rhonchi  ABDOMEN: Soft, non-tender, non-distended MUSCULOSKELETAL:  No edema; No deformity  SKIN: Warm and dry NEUROLOGIC:  Alert and oriented x 3 PSYCHIATRIC:  Normal affect   ASSESSMENT:    1. Coronary artery disease involving native coronary artery of native heart without angina pectoris   2. Mixed hyperlipidemia    PLAN:    In order of problems listed above:  1. The patient appears stable.  He is tolerating aspirin and clopidogrel without bleeding problems.  He is having problems with with acid reflux.  This may be exacerbated by Plavix.  I am going to try him on Protonix 40 mg daily and have asked him to discontinue Pepcid.  I would like to see him back when he is 1 year out from PCI and plan to do an exercise Myoview stress test at that time.  I would anticipate stopping his Plavix after 1 year. 2. The patient is statin intolerant.  He has been started on Repatha.  He has labs scheduled for tomorrow.  Medication  Adjustments/Labs and Tests Ordered: Current medicines are reviewed at length with the patient today.  Concerns regarding medicines are outlined above.  Orders Placed This Encounter  Procedures  . MYOCARDIAL PERFUSION IMAGING   Meds ordered this encounter  Medications  . pantoprazole (PROTONIX) 40 MG tablet    Sig: Take 1 tablet (40 mg total) by mouth daily.    Dispense:  90 tablet    Refill:  3    Patient Instructions  Medication Instructions:  1) STOP PEPCID 2) START PROTONIX (pantoprazole) 40 mg daily  Labwork: None  Testing/Procedures: Your provider has requested that you have a lexiscan myoview prior to your next office visit in 2020. For further information please visit [email or web address]. Please follow instruction sheet, as given.  Follow-Up: Your provider recommends that you schedule a follow-up appointment in September, 2020.  Any Other Special Instructions Will Be Listed Below (If Applicable).     If you need a refill on your cardiac medications before your next appointment, please call your pharmacy.      Signed, Sherren Mocha, MD  11/27/2018 5:08 PM    [place]

## 2018-11-27 NOTE — Progress Notes (Signed)
Medicare Annual Preventive Care Visit  (initial annual wellness or annual wellness exam)  Concerns and/or follow up today:  Ronnie Vazquez is a pleasant 72 yo with a PMH:  CAD/HLD: -s/p PCI of the LAD and balloon angioplasty 08/2018 -sees cardiology for management -on plavix, asaand repatha (statin intolerance) - reports wants [place] to draw lipids today and send to his cardiologist  GERD: -started after starting plavix -cardiologist initiated protonix 40mg  daily 11/19 -pepcid did not help, reports PPI helping  Overweight: -healthy mediterreanean diet and regular exercise advised  Hx hematuria: -referred to urology -reports he will follow up with urology for recheck and prostate ca screening, no concerns  Mild renal insufficiency on recent labs from the hospital.  Hx ?TIA: -at age 52, remote, normal mri per his report -sees neurology and opthomology   See HM section in Epic for other details of completed HM. See scanned documentation under Media Tab for further documentation HPI, health risk assessment. See Media Tab and Care Teams sections in Epic for other providers.  ROS: negative for report of fevers, unintentional weight loss, vision changes, vision loss, hearing loss or change, chest pain, sob, hemoptysis, melena, hematochezia, hematuria, genital discharge or lesions, falls, bleeding or bruising, loc, thoughts of suicide or self harm, memory loss  1.) Patient-completed health risk assessment  - completed and reviewed, see scanned documentation  2.) Review of Medical History: -PMH, PSH, Family History and current specialty and care providers reviewed and updated and listed below  - see scanned in document in chart and below  Past Medical History:  Diagnosis Date  . Chicken pox    hx  . Coronary artery disease    08/29/18 PCI/DES with bifurcation from LAD into 1st diag.   Marland Kitchen Hyperlipidemia   . Left shoulder pain 02/02/2013  . Microscopic hematuria 02/02/2013  . Vision loss  of left eye     Past Surgical History:  Procedure Laterality Date  . COLONOSCOPY    . CORONARY BALLOON ANGIOPLASTY N/A 08/29/2018   Procedure: CORONARY BALLOON ANGIOPLASTY;  Surgeon: Sherren Mocha, MD;  Location: [place] [facility];  Service: Cardiovascular;  Laterality: N/A;  . CORONARY STENT INTERVENTION N/A 08/29/2018   Procedure: CORONARY STENT INTERVENTION;  Surgeon: Sherren Mocha, MD;  Location: [place] [facility];  Service: Cardiovascular;  Laterality: N/A;  . LEFT HEART CATH AND CORONARY ANGIOGRAPHY N/A 08/08/2018   Procedure: LEFT HEART CATH AND CORONARY ANGIOGRAPHY;  Surgeon: Lorretta Harp, MD;  Location: [place] [facility];  Service: Cardiovascular;  Laterality: N/A;  . TONSILLECTOMY AND ADENOIDECTOMY  1959    Social History   Socioeconomic History  . Marital status: Married    Spouse name: Not on file  . Number of children: Not on file  . Years of education: Not on file  . Highest education level: Not on file  Occupational History  . Not on file  Social Needs  . Financial resource strain: Not on file  . Food insecurity:    Worry: Not on file    Inability: Not on file  . Transportation needs:    Medical: Not on file    Non-medical: Not on file  Tobacco Use  . Smoking status: Never Smoker  . Smokeless tobacco: Never Used  Substance and Sexual Activity  . Alcohol use: Yes    Alcohol/week: 2.0 standard drinks    Types: 2 Glasses of wine per week  . Drug use: No  . Sexual activity: Not on file  Lifestyle  .  Physical activity:    Days per week: Not on file    Minutes per session: Not on file  . Stress: Not on file  Relationships  . Social connections:    Talks on phone: Not on file    Gets together: Not on file    Attends religious service: Not on file    Active member of club or organization: Not on file    Attends meetings of clubs or organizations: Not on file    Relationship status: Not on file  . Intimate partner violence:    Fear of current or ex  partner: Not on file    Emotionally abused: Not on file    Physically abused: Not on file    Forced sexual activity: Not on file  Other Topics Concern  . 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 Prostate cancer Father   . Colon cancer Neg Hx   . Esophageal cancer Neg Hx   . Pancreatic cancer Neg Hx   . Rectal cancer Neg Hx   . Stomach cancer Neg Hx   . Heart disease Neg Hx     Current Outpatient Medications on File Prior to Visit  Medication Sig Dispense Refill  . acetaminophen (TYLENOL) 325 MG tablet Take 650 mg by mouth at bedtime as needed (for sleep).     Marland Kitchen aspirin 81 MG tablet Take 81 mg by mouth daily.    . cetirizine (ZYRTEC) 10 MG tablet Take 10 mg by mouth daily as needed for allergies.    Marland Kitchen clopidogrel (PLAVIX) 75 MG tablet Take 1 tablet (75 mg total) by mouth daily. 90 tablet 3  . Evolocumab (REPATHA SURECLICK) 196 MG/ML SOAJ Inject 140 mg into the skin every 14 (fourteen) days. 2 pen 11  . nitroGLYCERIN (NITROSTAT) 0.4 MG SL tablet Place 1 tablet (0.4 mg total) under the tongue every 5 (five) minutes as needed for chest pain. 25 tablet 3  . pantoprazole (PROTONIX) 40 MG tablet Take 1 tablet (40 mg total) by mouth daily. 90 tablet 3   No current facility-administered medications on file prior to visit.      3.) Review of functional ability and level of safety:  Any difficulty hearing?  See scanned documentation - mild. Does not want hearing aides.  History of falling?  See scanned documentation  Any trouble with IADLs - using a phone, using transportation, grocery shopping, preparing meals, doing housework, doing laundry, taking medications and managing money?  See scanned documentation  Advance Directives?  Discussed  briefly and offered more resources and detailed discussion with our trained staff. Has and declines changes.  See summary of recommendations in Patient Instructions below.  4.) Physical Exam Vitals:   11/28/18 0753  BP: 100/62  Pulse: 68  Temp: 97.6 F (36.4 C)   Estimated body mass index is 19.22 kg/m as calculated from the following:   Height as of this encounter: 6\' 11"  (2.108 m).   Weight as of this encounter: 188 lb 4.8 oz (85.4 kg).  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  Education and counseling regarding the above review of health provided with a plan for the following: -see scanned patient completed form for further details -fall prevention strategies discussed  -healthy lifestyle discussed -importance and resources for completing advanced directives discussed -see patient instructions below for any other recommendations provided  4)The following written screening schedule of preventive measures were reviewed with assessment and plan made per below, orders and patient instructions:      AAA screening done if applicable     Alcohol screening done     Obesity Screening and counseling done     STI screening (Hep C if born 1945-65) offered and per pt wishes     Tobacco Screening done done       Pneumococcal (PPSV23 -one dose after 64, one before if risk factors), influenza yearly and hepatitis B vaccines (if high risk - end stage renal disease, IV drugs, homosexual men, live in home for mentally retarded, hemophilia receiving factors) ASSESSMENT/PLAN: done if applicable      Prostate cancer screening ASSESSMENT/PLAN: n/a, sees urology       Colorectal cancer screening (FOBT yearly or flex sig q4y or colonoscopy q10y or barium enema q4y) ASSESSMENT/PLAN: utd - did colonoscopy with Dr Fuller Plan in 10/2017      Diabetes outpatient self-management training services ASSESSMENT/PLAN: utd or done      Bone mass measurements(covered q2y if indicated - estrogen def, osteoporosis, hyperparathyroid, vertebral abnormalities, osteoporosis or steroids) ASSESSMENT/PLAN: utd or discussed and ordered per pt wishes      Screening for glaucoma(q1y if high risk - diabetes, FH, AA and > 50 or hispanic and > 65) ASSESSMENT/PLAN: utd or advised - sees opthomology      Medical nutritional therapy for individuals with diabetes or renal disease ASSESSMENT/PLAN: see orders      Cardiovascular screening blood tests (lipids q5y) ASSESSMENT/PLAN: see orders and labs - he wants lipids here today, fasting, sees cardiology for managment      Diabetes screening tests ASSESSMENT/PLAN: see orders and labs   7.) Summary:   Medicare annual wellness visit, subsequent -risk factors and conditions per above assessment were discussed and treatment, recommendations and referrals were offered per documentation above and orders and patient instructions.  Screening for depression -see epic  Pure hypercholesterolemia -he wants [place] to draw his lipid panel today and forward to his cardiologist  Coronary artery disease with exertional angina ([place]) -check bmp, cbc   Microscopic hematuria -he reports he will schedule follow up with urologist, no symptoms  Renal insufficiency -check bmp, cbc  Gastroesophageal reflux disease, esophagitis presence not specified  Patient Instructions   BEFORE YOU LEAVE: -needs PHQ9 yearly in epic, let me know if abnormal -labs -follow up: 6 months  We have ordered labs or studies at this visit. It can take up to 1-2 weeks for results and processing. IF results require follow up or explanation, we will call you with  instructions. Clinically stable results will be released to your [facility]. If you have not heard from [place] or cannot find your results in [facility] in 2 weeks please contact our office at [phone].  If you are not yet signed up for [facility], please consider signing up.   Mr. Faul , Thank you for taking time  to come for your Medicare Wellness Visit. I appreciate your ongoing commitment to your health goals. Please review the following plan we discussed and let me know if I can assist you in the future.   These are the goals we discussed: Goals   Healthy diet Regular exercise Consider shingles vaccine Follow up with your urologist     This is a list of the screening recommended for you and due dates:  Health Maintenance  Topic Date Due  . Tetanus Vaccine  05/28/2019  . Colon Cancer Screening  11/24/2020  .  Hepatitis C: One time screening is recommended by Center for Disease Control  (CDC) for  adults born from [birth year] through 1963.   Completed  . Pneumonia vaccines  Completed  . Flu Shot  Refused/pt declined           Lucretia Kern, DO

## 2018-11-27 NOTE — Patient Instructions (Signed)
Medication Instructions:  1) STOP PEPCID 2) START PROTONIX (pantoprazole) 40 mg daily  Labwork: None  Testing/Procedures: Your provider has requested that you have a lexiscan myoview prior to your next office visit in 2020. For further information please visit [email or web address]. Please follow instruction sheet, as given.  Follow-Up: Your provider recommends that you schedule a follow-up appointment in September, 2020.  Any Other Special Instructions Will Be Listed Below (If Applicable).     If you need a refill on your cardiac medications before your next appointment, please call your pharmacy.

## 2018-11-28 ENCOUNTER — Encounter: Payer: Self-pay | Admitting: Family Medicine

## 2018-11-28 ENCOUNTER — Ambulatory Visit (INDEPENDENT_AMBULATORY_CARE_PROVIDER_SITE_OTHER): Payer: Medicare Other | Admitting: Family Medicine

## 2018-11-28 VITALS — BP 100/62 | HR 68 | Temp 97.6°F | Ht 69.25 in | Wt 188.3 lb

## 2018-11-28 DIAGNOSIS — R3129 Other microscopic hematuria: Secondary | ICD-10-CM

## 2018-11-28 DIAGNOSIS — Z0000 Encounter for general adult medical examination without abnormal findings: Secondary | ICD-10-CM | POA: Diagnosis not present

## 2018-11-28 DIAGNOSIS — K219 Gastro-esophageal reflux disease without esophagitis: Secondary | ICD-10-CM

## 2018-11-28 DIAGNOSIS — N289 Disorder of kidney and ureter, unspecified: Secondary | ICD-10-CM | POA: Diagnosis not present

## 2018-11-28 DIAGNOSIS — I25118 Atherosclerotic heart disease of native coronary artery with other forms of angina pectoris: Secondary | ICD-10-CM | POA: Diagnosis not present

## 2018-11-28 DIAGNOSIS — Z1331 Encounter for screening for depression: Secondary | ICD-10-CM | POA: Diagnosis not present

## 2018-11-28 DIAGNOSIS — E7800 Pure hypercholesterolemia, unspecified: Secondary | ICD-10-CM | POA: Diagnosis not present

## 2018-11-28 LAB — LIPID PANEL
CHOLESTEROL: 113 mg/dL (ref 0–200)
HDL: 34.9 mg/dL — ABNORMAL LOW (ref >39.00)
LDL Cholesterol: 45 mg/dL (ref 0–99)
NonHDL: 77.68
TRIGLYCERIDES: 161 mg/dL — AB (ref 0.0–149.0)
Total CHOL/HDL Ratio: 3
VLDL: 32.2 mg/dL (ref 0.0–40.0)

## 2018-11-28 LAB — CBC
HCT: 44.4 % (ref 39.0–52.0)
Hemoglobin: 15.2 g/dL (ref 13.0–17.0)
MCHC: 34.2 g/dL (ref 30.0–36.0)
MCV: 97.5 fl (ref 78.0–100.0)
PLATELETS: 225 10*3/uL (ref 150.0–400.0)
RBC: 4.55 Mil/uL (ref 4.22–5.81)
RDW: 12.8 % (ref 11.5–15.5)
WBC: 5.6 10*3/uL (ref 4.0–10.5)

## 2018-11-28 LAB — BASIC METABOLIC PANEL
BUN: 21 mg/dL (ref 6–23)
CALCIUM: 9.8 mg/dL (ref 8.4–10.5)
CO2: 28 meq/L (ref 19–32)
CREATININE: 1.36 mg/dL (ref 0.40–1.50)
Chloride: 104 mEq/L (ref 96–112)
GFR: 55.06 mL/min — AB (ref >60.00)
GLUCOSE: 93 mg/dL (ref 70–99)
Potassium: 4.8 mEq/L (ref 3.5–5.1)
SODIUM: 140 meq/L (ref 135–145)

## 2018-11-28 NOTE — Patient Instructions (Signed)
BEFORE YOU LEAVE: -needs PHQ9 yearly in epic, let me know if abnormal -labs -follow up: 6 months  We have ordered labs or studies at this visit. It can take up to 1-2 weeks for results and processing. IF results require follow up or explanation, we will call you with instructions. Clinically stable results will be released to your [facility]. If you have not heard from [place] or cannot find your results in [facility] in 2 weeks please contact our office at [phone].  If you are not yet signed up for [facility], please consider signing up.   Mr. Snowdon , Thank you for taking time to come for your Medicare Wellness Visit. I appreciate your ongoing commitment to your health goals. Please review the following plan we discussed and let me know if I can assist you in the future.   These are the goals we discussed: Goals   Healthy diet Regular exercise Consider shingles vaccine Follow up with your urologist     This is a list of the screening recommended for you and due dates:  Health Maintenance  Topic Date Due  . Tetanus Vaccine  05/28/2019  . Colon Cancer Screening  11/24/2020  .  Hepatitis C: One time screening is recommended by Center for Disease Control  (CDC) for  adults born from [birth year] through 1963.   Completed  . Pneumonia vaccines  Completed  . Flu Shot  Refused/pt declined

## 2018-11-29 ENCOUNTER — Encounter (HOSPITAL_COMMUNITY): Payer: Medicare Other

## 2018-12-01 ENCOUNTER — Encounter (HOSPITAL_COMMUNITY): Payer: Medicare Other

## 2018-12-01 ENCOUNTER — Encounter (HOSPITAL_COMMUNITY)
Admission: RE | Admit: 2018-12-01 | Discharge: 2018-12-01 | Disposition: A | Discharge status: Home / Self Care | Payer: Medicare Other | Source: Ambulatory Visit | Attending: Cardiovascular Disease | Admitting: Cardiovascular Disease

## 2018-12-01 DIAGNOSIS — Z79899 Other long term (current) drug therapy: Secondary | ICD-10-CM | POA: Diagnosis not present

## 2018-12-01 DIAGNOSIS — E785 Hyperlipidemia, unspecified: Secondary | ICD-10-CM | POA: Diagnosis not present

## 2018-12-01 DIAGNOSIS — Z955 Presence of coronary angioplasty implant and graft: Secondary | ICD-10-CM | POA: Diagnosis not present

## 2018-12-04 ENCOUNTER — Encounter (HOSPITAL_COMMUNITY)
Admission: RE | Admit: 2018-12-04 | Discharge: 2018-12-04 | Disposition: A | Discharge status: Home / Self Care | Payer: Medicare Other | Source: Ambulatory Visit | Attending: Cardiovascular Disease | Admitting: Cardiovascular Disease

## 2018-12-04 ENCOUNTER — Encounter (HOSPITAL_COMMUNITY): Payer: Medicare Other

## 2018-12-04 DIAGNOSIS — Z955 Presence of coronary angioplasty implant and graft: Secondary | ICD-10-CM

## 2018-12-04 DIAGNOSIS — E785 Hyperlipidemia, unspecified: Secondary | ICD-10-CM | POA: Diagnosis not present

## 2018-12-04 DIAGNOSIS — Z79899 Other long term (current) drug therapy: Secondary | ICD-10-CM | POA: Diagnosis not present

## 2018-12-05 DIAGNOSIS — H6993 Unspecified Eustachian tube disorder, bilateral: Secondary | ICD-10-CM | POA: Diagnosis not present

## 2018-12-05 DIAGNOSIS — H9313 Tinnitus, bilateral: Secondary | ICD-10-CM | POA: Diagnosis not present

## 2018-12-06 ENCOUNTER — Encounter (HOSPITAL_COMMUNITY)
Admission: RE | Admit: 2018-12-06 | Discharge: 2018-12-06 | Disposition: A | Discharge status: Home / Self Care | Payer: Medicare Other | Source: Ambulatory Visit | Attending: Cardiovascular Disease | Admitting: Cardiovascular Disease

## 2018-12-06 ENCOUNTER — Encounter (HOSPITAL_COMMUNITY): Payer: Medicare Other

## 2018-12-06 ENCOUNTER — Telehealth: Payer: Self-pay

## 2018-12-06 ENCOUNTER — Telehealth: Payer: Self-pay | Admitting: *Deleted

## 2018-12-06 DIAGNOSIS — Z955 Presence of coronary angioplasty implant and graft: Secondary | ICD-10-CM | POA: Diagnosis not present

## 2018-12-06 DIAGNOSIS — Z79899 Other long term (current) drug therapy: Secondary | ICD-10-CM | POA: Diagnosis not present

## 2018-12-06 DIAGNOSIS — E785 Hyperlipidemia, unspecified: Secondary | ICD-10-CM | POA: Diagnosis not present

## 2018-12-06 NOTE — Telephone Encounter (Signed)
-----   Message from Ronnie Vazquez, [place] sent at 12/06/2018  3:51 PM EST ----- Regarding: Antibotics  Ronnie Vazquez called his dentist wants to be sure that he does not need to have antibiotics before he has his teeth cleaned tomorrow? Can you call him back today ASAP. APPT is at 900 AM 12/12  Pt called me I tried to call pharmacy and triage. I did not want to transfer him again.   PT Call back [phone]  Thanks   Erline Levine

## 2018-12-06 NOTE — Telephone Encounter (Signed)
I reviewed chart and called pt. No artificial heart valve, congenital heart disease or artificial joints that would indicate need for SBE prophylaxis. Pt understands and he just wanted to make sure.

## 2018-12-06 NOTE — Telephone Encounter (Signed)
Copied from [place] [id number]. Topic: General - Other >> Dec 06, 2018  3:48 PM Carolyn Stare wrote:  Pt said he went to see Dr Lucia Gaskins and they told him there was nothing they could do. He said Dr Maudie Mercury told him about another place that she did intern at they may be able to help him

## 2018-12-06 NOTE — Telephone Encounter (Signed)
To pre-op pool for SBE recommendations.

## 2018-12-07 ENCOUNTER — Encounter (HOSPITAL_COMMUNITY): Payer: Self-pay

## 2018-12-07 NOTE — Telephone Encounter (Signed)
Please find out current symptoms and evaluation done with Dr. Lucia Gaskins? Then we can try to help. Thanks.

## 2018-12-07 NOTE — Telephone Encounter (Signed)
Dr. Lucia Gaskins is usually pretty thorough - so not sure another ENT would have something to offer if he said nothing could be done. But, if he wants second opinion he could try [facility]. I am not sure about the Repatha.

## 2018-12-07 NOTE — Telephone Encounter (Signed)
Patient came in to the office today and stated he was seen by Dr Lucia Gaskins Tuesday and was told if the nerves in his ear are damaged they do not regenerate and his hearing was OK.  He stated he is still concerned about the high pitched noise and since this is a sudden problem he questioned if this could be due to taking Repatha?  Stated he told Dr Burt Knack he has this and he told him he has the same thing.  Patient asked if Dr Maudie Mercury could do some research on this and questioned if he went to another ENT could they tell him something different?  Patient requested a call back at [phone].  Message sent to Dr Maudie Mercury.

## 2018-12-07 NOTE — Progress Notes (Signed)
Cardiac Individual Treatment Plan  Patient Details  Name: Ronnie Vazquez MRN: [id number] Date of Birth: 12-Aug-1946 Referring Provider:     [facility] from 10/17/2018 in [facility]  Referring Provider  Sherren Mocha, MD      Initial Encounter Date:    CARDIAC REHAB PHASE II ORIENTATION from 10/17/2018 in [facility]  Date  10/17/18      Visit Diagnosis: 08/29/2018 Stented coronary artery  Patient's Home Medications on Admission:  Current Outpatient Medications:  .  acetaminophen (TYLENOL) 325 MG tablet, Take 650 mg by mouth at bedtime as needed (for sleep). , Disp: , Rfl:  .  aspirin 81 MG tablet, Take 81 mg by mouth daily., Disp: , Rfl:  .  cetirizine (ZYRTEC) 10 MG tablet, Take 10 mg by mouth daily as needed for allergies., Disp: , Rfl:  .  clopidogrel (PLAVIX) 75 MG tablet, Take 1 tablet (75 mg total) by mouth daily., Disp: 90 tablet, Rfl: 3 .  Evolocumab (REPATHA SURECLICK) 235 MG/ML SOAJ, Inject 140 mg into the skin every 14 (fourteen) days., Disp: 2 pen, Rfl: 11 .  nitroGLYCERIN (NITROSTAT) 0.4 MG SL tablet, Place 1 tablet (0.4 mg total) under the tongue every 5 (five) minutes as needed for chest pain., Disp: 25 tablet, Rfl: 3 .  pantoprazole (PROTONIX) 40 MG tablet, Take 1 tablet (40 mg total) by mouth daily., Disp: 90 tablet, Rfl: 3  Past Medical History: Past Medical History:  Diagnosis Date  . Chicken pox    hx  . Coronary artery disease    08/29/18 PCI/DES with bifurcation from LAD into 1st diag.   Marland Kitchen Hyperlipidemia   . Left shoulder pain 02/02/2013  . Microscopic hematuria 02/02/2013  . Vision loss of left eye     Tobacco Use: Social History   Tobacco Use  Smoking Status Never Smoker  Smokeless Tobacco Never Used    Labs: Recent Review Flowsheet Data    Labs for ITP Cardiac and Pulmonary Rehab Latest Ref Rng & Units 04/05/2016 08/12/2017 08/10/2018 09/29/2018 11/28/2018    Cholestrol 0 - 200 mg/dL 247(H) 224(H) - 145 113   LDLCALC 0 - 99 mg/dL - - - 75 45   LDLDIRECT mg/dL 102.0 117.0 - - -   HDL >39.00 mg/dL 40.20 42.00 - 39(L) 34.90(L)   Trlycerides 0.0 - 149.0 mg/dL 323.0(H) 301.0(H) - 157(H) 161.0(H)   Hemoglobin A1c 4.8 - 5.6 % - 5.5 5.4 - -   PHART 7.350 - 7.450 - - 7.472(H) - -   PCO2ART 32.0 - 48.0 mmHg - - 33.9 - -   HCO3 20.0 - 28.0 mmol/L - - 24.5 - -   O2SAT % - - 87.6 - -      Capillary Blood Glucose: No results found for: GLUCAP   Exercise Target Goals: Exercise Program Goal: Individual exercise prescription set using results from initial 6 min walk test and THRR while considering  patient's activity barriers and safety.   Exercise Prescription Goal: Initial exercise prescription builds to 30-45 minutes a day of aerobic activity, 2-3 days per week.  Home exercise guidelines will be given to patient during program as part of exercise prescription that the participant will acknowledge.  Activity Barriers & Risk Stratification: Activity Barriers & Cardiac Risk Stratification - 10/17/18 0937      Activity Barriers & Cardiac Risk Stratification   Activity Barriers  Joint Problems   Left Shoulder Pain   Cardiac Risk  Stratification  High       6 Minute Walk: 6 Minute Walk    Row Name 10/17/18 0906 10/18/18 1104       6 Minute Walk   Phase  Initial  -    Distance  1826 feet  1826 feet    Walk Time  6 minutes  6 minutes    # of Rest Breaks  0  -    MPH  3.46  -    METS  3.67  -    RPE  13  13    VO2 Peak  -  12.86    Symptoms  No  No    Resting HR  79 bpm  79 bpm    Resting BP  94/60  94/60    Max Ex. HR  102 bpm  102 bpm    Max Ex. BP  102/62  102/62    2 Minute Post BP  92/60  -       Oxygen Initial Assessment:   Oxygen Re-Evaluation:   Oxygen Discharge (Final Oxygen Re-Evaluation):   Initial Exercise Prescription: Initial Exercise Prescription - 10/17/18 0900      Date of Initial Exercise RX and Referring  Provider   Date  10/17/18    Referring Provider  Sherren Mocha, MD    Expected Discharge Date  01/26/19      Bike   Level  1.2    Minutes  10    METs  3.6      NuStep   Level  3    SPM  85    Minutes  10    METs  3.6      Track   Laps  15    Minutes  10    METs  3.6      Prescription Details   Frequency (times per week)  3    Duration  Progress to 30 minutes of continuous aerobic without signs/symptoms of physical distress      Intensity   THRR 40-80% of Max Heartrate  60-121    Ratings of Perceived Exertion  11-13      Progression   Progression  Continue to progress workloads to maintain intensity without signs/symptoms of physical distress.      Resistance Training   Training Prescription  Yes    Weight  3 lbs    Reps  10-15       Perform Capillary Blood Glucose checks as needed.  Exercise Prescription Changes: Exercise Prescription Changes    Row Name 10/23/18 1300 11/06/18 1600 11/13/18 1410 11/22/18 1036 11/27/18 1354     Response to Exercise   Blood Pressure (Admit)  122/73  100/70  108/66  114/82  116/60   Blood Pressure (Exercise)  126/70  124/72  138/62  110/72  124/70   Blood Pressure (Exit)  100/68  102/60  102/68  98/62  108/74   Heart Rate (Admit)  90 bpm  76 bpm  68 bpm  72 bpm  84 bpm   Heart Rate (Exercise)  107 bpm  97 bpm  95 bpm  94 bpm  102 bpm   Heart Rate (Exit)  85 bpm  82 bpm  60 bpm  76 bpm  84 bpm   Rating of Perceived Exertion (Exercise)  13  11  12  11  11    Perceived Dyspnea (Exercise)  0  0  0  0  0   Symptoms  None  None  None  None  None   Comments  Pt oriented to exercise equipment  None  Added Treadmill to Exercise Prescription   None  None   Duration  Progress to 30 minutes of  aerobic without signs/symptoms of physical distress  Progress to 30 minutes of  aerobic without signs/symptoms of physical distress  Progress to 30 minutes of  aerobic without signs/symptoms of physical distress  Progress to 30 minutes of  aerobic  without signs/symptoms of physical distress  Continue with 30 min of aerobic exercise without signs/symptoms of physical distress.   Intensity  THRR New  THRR unchanged  THRR unchanged  THRR unchanged  THRR unchanged     Progression   Progression  Continue to progress workloads to maintain intensity without signs/symptoms of physical distress.  Continue to progress workloads to maintain intensity without signs/symptoms of physical distress.  Continue to progress workloads to maintain intensity without signs/symptoms of physical distress.  Continue to progress workloads to maintain intensity without signs/symptoms of physical distress.  Continue to progress workloads to maintain intensity without signs/symptoms of physical distress.   Average METs  3.24  3.71  3.8  3.83  4.12     Resistance Training   Training Prescription  Yes  Yes  Yes  No  No   Weight  3lbs  5lbs  5lbs  -  -   Reps  10-15  10-15  10-15  -  -   Time  10 Minutes  10 Minutes  10 Minutes  -  -     Interval Training   Interval Training  No  No  No  No  No     Treadmill   MPH  -  -  2.8  3.1  3.1   Grade  -  -  2  2  2    Minutes  -  -  10  10  10    METs  -  -  3.91  4.23  4.23     Bike   Level  1.2  1.2  1.2  1.2  1.5   Minutes  10  10  10  10  10    METs  3.61  3.61  3.66  3.66  4.33     NuStep   Level  3  5  5  5  5    SPM  80  95  95  95  95   Minutes  10  10  10  10  10    METs  2.5  3.7  3.8  3.6  3.8     Track   Laps  15  16  -  -  -   Minutes  10  10  -  -  -   METs  3.61  3.76  -  -  -     Home Exercise Plan   Plans to continue exercise at  -  -  [employer] (comment) [employer] (comment) [employer] (comment) [employer]   Frequency  -  -  Add 2 additional days to program exercise sessions.  Add 2 additional days to program exercise sessions.  Add 2 additional days to program exercise sessions.   Initial Home Exercises Provided  -  -  11/08/18  11/08/18  11/08/18    Row Name 12/04/18 1600             Response to Exercise   Blood Pressure (Admit)  100/60  Blood Pressure (Exercise)  116/72       Blood Pressure (Exit)  98/70       Heart Rate (Admit)  87 bpm       Heart Rate (Exercise)  104 bpm       Heart Rate (Exit)  86 bpm       Rating of Perceived Exertion (Exercise)  12       Perceived Dyspnea (Exercise)  0       Symptoms  None       Comments  None       Duration  Continue with 30 min of aerobic exercise without signs/symptoms of physical distress.       Intensity  THRR unchanged         Progression   Progression  Continue to progress workloads to maintain intensity without signs/symptoms of physical distress.       Average METs  4.4         Resistance Training   Training Prescription  Yes       Weight  6lbs       Reps  10-15       Time  10 Minutes         Interval Training   Interval Training  No         Treadmill   MPH  3.5       Grade  2       Minutes  10       METs  4.65         Bike   Level  1.5       Minutes  10       METs  4.35         NuStep   Level  5       SPM  105       Minutes  10       METs  4.2         Home Exercise Plan   Plans to continue exercise at  [employer] (comment) [employer]       Frequency  Add 2 additional days to program exercise sessions.       Initial Home Exercises Provided  11/08/18          Exercise Comments: Exercise Comments    Row Name 10/23/18 1327 11/08/18 1423 12/07/18 1401       Exercise Comments  Pt's first day of Cardiac Rehab today. Pt responded well to exercise prescription. Pt reported that he has been having low back pain. Pt did not report any problems with exercise today. Will continue to monitor and progress pt as tolerated.   Reviewed HEP with pt. Pt verbalized understanding. Will continue to follow up with pt regarding home exercise adherence.   Reviewed METs and Goals. Pt continuing to respond well to exercise workloads. Will continue to progress  workloads as tolerated.        Exercise Goals and Review: Exercise Goals    Row Name 10/17/18 1014             Exercise Goals   Increase Physical Activity  Yes       Intervention  Provide advice, education, support and counseling about physical activity/exercise needs.;Develop an individualized exercise prescription for aerobic and resistive training based on initial evaluation findings, risk stratification, comorbidities and participant's personal goals.       Expected Outcomes  Short Term: Attend rehab on a regular basis to increase  amount of physical activity.       Increase Strength and Stamina  Yes       Intervention  Provide advice, education, support and counseling about physical activity/exercise needs.;Develop an individualized exercise prescription for aerobic and resistive training based on initial evaluation findings, risk stratification, comorbidities and participant's personal goals.       Expected Outcomes  Short Term: Increase workloads from initial exercise prescription for resistance, speed, and METs.       Able to understand and use rate of perceived exertion (RPE) scale  Yes       Intervention  Provide education and explanation on how to use RPE scale       Expected Outcomes  Short Term: Able to use RPE daily in rehab to express subjective intensity level;Long Term:  Able to use RPE to guide intensity level when exercising independently       Knowledge and understanding of Target Heart Rate Range (THRR)  Yes       Intervention  Provide education and explanation of THRR including how the numbers were predicted and where they are located for reference       Expected Outcomes  Short Term: Able to state/look up THRR;Long Term: Able to use THRR to govern intensity when exercising independently;Short Term: Able to use daily as guideline for intensity in rehab       Able to check pulse independently  Yes       Intervention  Provide education and demonstration on how to check  pulse in carotid and radial arteries.;Review the importance of being able to check your own pulse for safety during independent exercise       Expected Outcomes  Short Term: Able to explain why pulse checking is important during independent exercise       Understanding of Exercise Prescription  Yes       Intervention  Provide education, explanation, and written materials on patient's individual exercise prescription       Expected Outcomes  Short Term: Able to explain program exercise prescription;Long Term: Able to explain home exercise prescription to exercise independently          Exercise Goals Re-Evaluation : Exercise Goals Re-Evaluation    Row Name 11/08/18 1426 12/07/18 1416           Exercise Goal Re-Evaluation   Exercise Goals Review  Increase Physical Activity;Able to understand and use rate of perceived exertion (RPE) scale;Knowledge and understanding of Target Heart Rate Range (THRR);Understanding of Exercise Prescription;Increase Strength and Stamina;Able to check pulse independently  Increase Physical Activity;Understanding of Exercise Prescription      Comments  Reviewed HEP with pt. Also reviewed THRR, RPE Scale, weather conditions, endpoints of exercise, NTG use, warmup and cool down.   Pt is able to exercise 30 minutes with no difficulty. Pt is tolerating treadmill well since adding to exercise prescription. Will work to increase pt to 6lbs handweights.       Expected Outcomes  Pt will continue to exercise at local [place] 2-3 days a week, 30-45 minutes. Pt will continue to increase cardiorespiratory and muscular fitness. Will continue to monitor.   Pt is continuing to visit [employer] 2x a week on weekends. Will follow up with pt regarding strength training. One of pt's goals is to return to lifting weights.          Discharge Exercise Prescription (Final Exercise Prescription Changes): Exercise Prescription Changes - 12/04/18 1600      Response to  Exercise    Blood Pressure (Admit)  100/60    Blood Pressure (Exercise)  116/72    Blood Pressure (Exit)  98/70    Heart Rate (Admit)  87 bpm    Heart Rate (Exercise)  104 bpm    Heart Rate (Exit)  86 bpm    Rating of Perceived Exertion (Exercise)  12    Perceived Dyspnea (Exercise)  0    Symptoms  None    Comments  None    Duration  Continue with 30 min of aerobic exercise without signs/symptoms of physical distress.    Intensity  THRR unchanged      Progression   Progression  Continue to progress workloads to maintain intensity without signs/symptoms of physical distress.    Average METs  4.4      Resistance Training   Training Prescription  Yes    Weight  6lbs    Reps  10-15    Time  10 Minutes      Interval Training   Interval Training  No      Treadmill   MPH  3.5    Grade  2    Minutes  10    METs  4.65      Bike   Level  1.5    Minutes  10    METs  4.35      NuStep   Level  5    SPM  105    Minutes  10    METs  4.2      Home Exercise Plan   Plans to continue exercise at  [employer] (comment)   [employer]   Frequency  Add 2 additional days to program exercise sessions.    Initial Home Exercises Provided  11/08/18       Nutrition:  Target Goals: Understanding of nutrition guidelines, daily intake of sodium 1500mg , cholesterol 200mg , calories 30% from fat and 7% or less from saturated fats, daily to have 5 or more servings of fruits and vegetables.  Biometrics: Pre Biometrics - 10/17/18 0908      Pre Biometrics   Height  5' 9.25" (1.759 m)    Weight  86.8 kg    Waist Circumference  40.5 inches    Hip Circumference  40 inches    Waist to Hip Ratio  1.01 %    BMI (Calculated)  28.05    Triceps Skinfold  28 mm    % Body Fat  30.4 %    Grip Strength  37 kg    Flexibility  8 in    Single Leg Stand  7 seconds        Nutrition Therapy Plan and Nutrition Goals: Nutrition Therapy & Goals - 10/17/18 0918      Nutrition Therapy   Diet  heart  healthy      Personal Nutrition Goals   Nutrition Goal  Pt to identify and limit food sources of saturated fat, trans fat, refined carbohydrates and sodium    Personal Goal #2  pt to identify food quantities necessary to achieve weight loss of 6-24 lbs at graduation from cardiac rehab. Goal weight of less than 180 lbs desired      Intervention Plan   Intervention  Prescribe, educate and counsel regarding individualized specific dietary modifications aiming towards targeted core components such as weight, hypertension, lipid management, diabetes, heart failure and other comorbidities.    Expected Outcomes  Short Term Goal: Understand basic principles of dietary content, such  as calories, fat, sodium, cholesterol and nutrients.;Long Term Goal: Adherence to prescribed nutrition plan.       Nutrition Assessments: Nutrition Assessments - 10/17/18 0920      MEDFICTS Scores   Pre Score  18       Nutrition Goals Re-Evaluation:   Nutrition Goals Re-Evaluation:   Nutrition Goals Discharge (Final Nutrition Goals Re-Evaluation):   Psychosocial: Target Goals: Acknowledge presence or absence of significant depression and/or stress, maximize coping skills, provide positive support system. Participant is able to verbalize types and ability to use techniques and skills needed for reducing stress and depression.  Initial Review & Psychosocial Screening: Initial Psych Review & Screening - 10/17/18 0853      Initial Review   Current issues with  None Identified      Family Dynamics   Good Support System?  Yes      Barriers   Psychosocial barriers to participate in program  There are no identifiable barriers or psychosocial needs.      Screening Interventions   Interventions  Encouraged to exercise       Quality of Life Scores: Quality of Life - 10/17/18 0854      Quality of Life   Select  Quality of Life      Quality of Life Scores   Health/Function Pre  23.5 %    Socioeconomic  Pre  26 %    Psych/Spiritual Pre  24.6 %    Family Pre  27.6 %    GLOBAL Pre  24.9 %      Scores of 19 and below usually indicate a poorer quality of life in these areas.  A difference of  2-3 points is a clinically meaningful difference.  A difference of 2-3 points in the total score of the Quality of Life Index has been associated with significant improvement in overall quality of life, self-image, physical symptoms, and general health in studies assessing change in quality of life.  PHQ-9: Recent Review Flowsheet Data    Depression screen [facility] 2/9 11/28/2018 11/28/2018 10/23/2018 08/12/2017 12/01/2015   Decreased Interest 0 0 0 0 0   Down, Depressed, Hopeless 0 0 0 0 0   PHQ - 2 Score 0 0 0 0 0   Altered sleeping 0 - - - -   Tired, decreased energy 0 - - - -   Change in appetite 0 - - - -   Feeling bad or failure about yourself  0 - - - -   Trouble concentrating 0 - - - -   Moving slowly or fidgety/restless 0 - - - -   Suicidal thoughts 0 - - - -   PHQ-9 Score 0 - - - -     Interpretation of Total Score  Total Score Depression Severity:  1-4 = Minimal depression, 5-9 = Mild depression, 10-14 = Moderate depression, 15-19 = Moderately severe depression, 20-27 = Severe depression   Psychosocial Evaluation and Intervention: Psychosocial Evaluation - 10/23/18 1213      Psychosocial Evaluation & Interventions   Interventions  Encouraged to exercise with the program and follow exercise prescription    Comments  No pyschosocial needs identified. No intervention necessary.  Laurie enjoys Marketing executive.     Expected Outcomes  Joeanthony will continue to exhibit a positive outlook with good coping skills.     Continue Psychosocial Services   No Follow up required       Psychosocial Re-Evaluation: Psychosocial Re-Evaluation    [place] Name 11/16/18 1226  12/07/18 1601           Psychosocial Re-Evaluation   Current issues with  None Identified  None Identified      Comments  No psychosocial needs  identified. No interventions.   No psychosocial needs identified. No interventions.       Expected Outcomes  Galdino will maintain a positive outlook with good coping skills.   Loghan will maintain a positive outlook with good coping skills.       Interventions  Encouraged to attend Cardiac Rehabilitation for the exercise  Encouraged to attend Cardiac Rehabilitation for the exercise      Continue Psychosocial Services   No Follow up required  No Follow up required         Psychosocial Discharge (Final Psychosocial Re-Evaluation): Psychosocial Re-Evaluation - 12/07/18 1601      Psychosocial Re-Evaluation   Current issues with  None Identified    Comments  No psychosocial needs identified. No interventions.     Expected Outcomes  Michial will maintain a positive outlook with good coping skills.     Interventions  Encouraged to attend Cardiac Rehabilitation for the exercise    Continue Psychosocial Services   No Follow up required       Vocational Rehabilitation: Provide vocational rehab assistance to qualifying candidates.   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 Participant will state understanding/return demonstration of topics presented.  Learning Barriers/Preferences: Learning Barriers/Preferences - 10/17/18 0941      Learning Barriers/Preferences   Learning Barriers  Sight   Loss of vision left eye   Learning Preferences  Written Material       Education Topics: Count Your Pulse:  -Group instruction provided by verbal instruction, demonstration, patient participation and written materials to support subject.  Instructors address importance of being able to find your pulse and how to count your pulse when  at home without a heart monitor.  Patients get hands on experience counting their pulse with staff help and individually.   Heart Attack, Angina, and Risk Factor Modification:  -Group instruction provided by verbal instruction, video, and written materials to support subject.  Instructors address signs and symptoms of angina and heart attacks.    Also discuss risk factors for heart disease and how to make changes to improve heart health risk factors.   CARDIAC REHAB PHASE II EXERCISE from 12/06/2018 in [facility]  Date  12/06/18  Educator  RN  Instruction Review Code  2- Demonstrated Understanding      Functional Fitness:  -Group instruction provided by verbal instruction, demonstration, patient participation, and written materials to support subject.  Instructors address safety measures for doing things around the house.  Discuss how to get up and down off the floor, how to pick things up properly, how to safely get out of a chair without assistance, and balance training.   CARDIAC REHAB PHASE II EXERCISE from 12/06/2018 in [place]  Date  11/10/18  Instruction Review Code  3- Needs Reinforcement [Arrived late, missed first part of class.]      Meditation and Mindfulness:  -Group instruction provided by verbal instruction, patient participation, and written materials to support subject.  Instructor addresses importance of mindfulness and meditation practice to help reduce stress and improve awareness.  Instructor also leads participants through a meditation exercise.    Stretching for Flexibility and Mobility:  -Group instruction provided by verbal instruction, patient participation, and written materials to support subject.  Instructors lead participants through series of stretches that are designed to increase flexibility thus improving mobility.  These stretches are additional exercise for major muscle groups that are  typically performed during regular warm up and cool down.   Hands Only CPR:  -Group verbal, video, and participation provides a basic overview of AHA guidelines for community CPR. Role-play of emergencies allow participants the opportunity to practice calling for help and chest compression technique with discussion of AED use.   Hypertension: -Group verbal and written instruction that provides a basic overview of hypertension including the most recent diagnostic guidelines, risk factor reduction with self-care instructions and medication management.    Nutrition I class: Heart Healthy Eating:  -Group instruction provided by PowerPoint slides, verbal discussion, and written materials to support subject matter. The instructor gives an explanation and review of the Therapeutic Lifestyle Changes diet recommendations, which includes a discussion on lipid goals, dietary fat, sodium, fiber, plant stanol/sterol esters, sugar, and the components of a well-balanced, healthy diet.   Nutrition II class: Lifestyle Skills:  -Group instruction provided by PowerPoint slides, verbal discussion, and written materials to support subject matter. The instructor gives an explanation and review of label reading, grocery shopping for heart health, heart healthy recipe modifications, and ways to make healthier choices when eating out.   Diabetes Question & Answer:  -Group instruction provided by PowerPoint slides, verbal discussion, and written materials to support subject matter. The instructor gives an explanation and review of diabetes co-morbidities, pre- and post-prandial blood glucose goals, pre-exercise blood glucose goals, signs, symptoms, and treatment of hypoglycemia and hyperglycemia, and foot care basics.   Diabetes Blitz:  -Group instruction provided by PowerPoint slides, verbal discussion, and written materials to support subject matter. The instructor gives an explanation and review of the physiology  behind type 1 and type 2 diabetes, diabetes medications and rational behind using different medications, pre- and post-prandial blood glucose recommendations and Hemoglobin A1c goals, diabetes diet, and exercise including blood glucose guidelines for exercising safely.    Portion Distortion:  -Group instruction provided by PowerPoint slides, verbal discussion, written materials, and food models to support subject matter. The instructor gives an explanation of serving size versus portion size, changes in portions sizes over the last 20 years, and what consists of a serving from each food group.   Stress Management:  -Group instruction provided by verbal instruction, video, and written materials to support subject matter.  Instructors review role of stress in heart disease and how to cope with stress positively.     Exercising on Your Own:  -Group instruction provided by verbal instruction, power point, and written materials to support subject.  Instructors discuss benefits of exercise, components of exercise, frequency and intensity of exercise, and end points for exercise.  Also discuss use of nitroglycerin and activating EMS.  Review options of places to exercise outside of rehab.  Review guidelines for sex with heart disease.   Cardiac Drugs I:  -Group instruction provided by verbal instruction and written materials to support subject.  Instructor reviews cardiac drug classes: antiplatelets, anticoagulants, beta blockers, and statins.  Instructor discusses reasons, side effects, and lifestyle considerations for each drug class.   Cardiac Drugs II:  -Group instruction  provided by verbal instruction and written materials to support subject.  Instructor reviews cardiac drug classes: angiotensin converting enzyme inhibitors (ACE-I), angiotensin II receptor blockers (ARBs), nitrates, and calcium channel blockers.  Instructor discusses reasons, side effects, and lifestyle considerations for each drug  class.   Anatomy and Physiology of the Circulatory System:  Group verbal and written instruction and models provide basic cardiac anatomy and physiology, with the coronary electrical and arterial systems. Review of: AMI, Angina, Valve disease, Heart Failure, Peripheral Artery Disease, Cardiac Arrhythmia, Pacemakers, and the ICD.   Other Education:  -Group or individual verbal, written, or video instructions that support the educational goals of the cardiac rehab program.   Holiday Eating Survival Tips:  -Group instruction provided by PowerPoint slides, verbal discussion, and written materials to support subject matter. The instructor gives patients tips, tricks, and techniques to help them not only survive but enjoy the holidays despite the onslaught of food that accompanies the holidays.   Knowledge Questionnaire Score: Knowledge Questionnaire Score - 10/17/18 0942      Knowledge Questionnaire Score   Pre Score  21/24       Core Components/Risk Factors/Patient Goals at Admission: Personal Goals and Risk Factors at Admission - 10/17/18 0943      Core Components/Risk Factors/Patient Goals on Admission    Weight Management  Yes;Weight Loss;Weight Maintenance    Intervention  Weight Management: Develop a combined nutrition and exercise program designed to reach desired caloric intake, while maintaining appropriate intake of nutrient and fiber, sodium and fats, and appropriate energy expenditure required for the weight goal.;Weight Management: Provide education and appropriate resources to help participant work on and attain dietary goals.;Weight Management/Obesity: Establish reasonable short term and long term weight goals.    Admit Weight  191 lb 5.8 oz (86.8 kg)    Expected Outcomes  Short Term: Continue to assess and modify interventions until short term weight is achieved;Long Term: Adherence to nutrition and physical activity/exercise program aimed toward attainment of established  weight goal;Weight Maintenance: Understanding of the daily nutrition guidelines, which includes 25-35% calories from fat, 7% or less cal from saturated fats, less than 200mg  cholesterol, less than 1.5gm of sodium, & 5 or more servings of fruits and vegetables daily;Weight Loss: Understanding of general recommendations for a balanced deficit meal plan, which promotes 1-2 lb weight loss per week and includes a negative energy balance of [phone] kcal/d;Understanding recommendations for meals to include 15-35% energy as protein, 25-35% energy from fat, 35-60% energy from carbohydrates, less than 200mg  of dietary cholesterol, 20-35 gm of total fiber daily;Understanding of distribution of calorie intake throughout the day with the consumption of 4-5 meals/snacks    Hypertension  Yes    Intervention  Provide education on lifestyle modifcations including regular physical activity/exercise, weight management, moderate sodium restriction and increased consumption of fresh fruit, vegetables, and low fat dairy, alcohol moderation, and smoking cessation.;Monitor prescription use compliance.    Expected Outcomes  Short Term: Continued assessment and intervention until BP is < 140/63mm HG in hypertensive participants. < 130/58mm HG in hypertensive participants with diabetes, heart failure or chronic kidney disease.;Long Term: Maintenance of blood pressure at goal levels.    Lipids  Yes    Intervention  Provide education and support for participant on nutrition & aerobic/resistive exercise along with prescribed medications to achieve LDL 70mg , HDL >40mg .    Expected Outcomes  Short Term: Participant states understanding of desired cholesterol values and is compliant with medications prescribed. Participant is following exercise prescription and  nutrition guidelines.;Long Term: Cholesterol controlled with medications as prescribed, with individualized exercise RX and with personalized nutrition plan. Value goals: LDL < 70mg ,  HDL > 40 mg.    Personal Goal Other  Yes    Personal Goal  Pt wants to get back to his usual gym routine of interval training and using the elliptical.        Core Components/Risk Factors/Patient Goals Review:  Goals and Risk Factor Review    Row Name 10/23/18 1228 11/16/18 1517 12/07/18 1601         Core Components/Risk Factors/Patient Goals Review   Personal Goals Review  Weight Management/Obesity;Lipids;Hypertension;Other  Weight Management/Obesity;Lipids;Hypertension;Other  Weight Management/Obesity;Lipids;Hypertension;Other     Review  Pt willing to participate in CR exercise.  Habeeb would like to get back to his routine that he has been doing at the gym.  Pt willing to participate in CR exercise.  Aerik is tolerating exercise well.  He feels that he is increasing his strength.   Pt willing to participate in CR exercise.  Jadore is tolerating exercise well with increased workloads,      Expected Outcomes  Pt will continue to participate in CR exercise, nutrition, and lifestyle modification opportunities.   Pt will continue to participate in CR exercise, nutrition, and lifestyle modification opportunities.   Pt will continue to participate in CR exercise, nutrition, and lifestyle modification opportunities.         Core Components/Risk Factors/Patient Goals at Discharge (Final Review):  Goals and Risk Factor Review - 12/07/18 1601      Core Components/Risk Factors/Patient Goals Review   Personal Goals Review  Weight Management/Obesity;Lipids;Hypertension;Other    Review  Pt willing to participate in CR exercise.  Hadley is tolerating exercise well with increased workloads,     Expected Outcomes  Pt will continue to participate in CR exercise, nutrition, and lifestyle modification opportunities.        ITP Comments: ITP Comments    Row Name 10/17/18 0810 10/23/18 1213 11/16/18 1224 12/07/18 1558     ITP Comments  Dr. Fransico Him, Medical Director   30 Day ITP Review. Pt started  exercise today and tolerared it well.   30 Day ITP Review. Kelin continues to do well with exercise.  He feels he is increasing his strength.   30 Day ITP Review.  Jyaire continues to tolerate exercise well.  He is tolerating increased workloads.        Comments: See ITP Comments.

## 2018-12-08 ENCOUNTER — Encounter (HOSPITAL_COMMUNITY)
Admission: RE | Admit: 2018-12-08 | Discharge: 2018-12-08 | Disposition: A | Discharge status: Home / Self Care | Payer: Medicare Other | Source: Ambulatory Visit | Attending: Cardiovascular Disease | Admitting: Cardiovascular Disease

## 2018-12-08 ENCOUNTER — Encounter (HOSPITAL_COMMUNITY): Payer: Medicare Other

## 2018-12-08 DIAGNOSIS — Z955 Presence of coronary angioplasty implant and graft: Secondary | ICD-10-CM | POA: Diagnosis not present

## 2018-12-08 DIAGNOSIS — E785 Hyperlipidemia, unspecified: Secondary | ICD-10-CM | POA: Diagnosis not present

## 2018-12-08 DIAGNOSIS — Z79899 Other long term (current) drug therapy: Secondary | ICD-10-CM | POA: Diagnosis not present

## 2018-12-08 NOTE — Telephone Encounter (Signed)
I called the pt and informed him of the message below. 

## 2018-12-11 ENCOUNTER — Telehealth: Payer: Self-pay | Admitting: Pharmacist

## 2018-12-11 ENCOUNTER — Encounter (HOSPITAL_COMMUNITY): Payer: Medicare Other

## 2018-12-11 NOTE — Telephone Encounter (Signed)
He states that his neck, back, and legs ache on the Repatha. He has not taken his injection today.   He would be willing to try the Praluent injections. Will send for coverage through the insurance for the 75mg  every 14 days.   Will wait to start until he has an appropriate washout period and feeling better off the Repatha.

## 2018-12-11 NOTE — Telephone Encounter (Signed)
LMOM to discuss side effects

## 2018-12-11 NOTE — Telephone Encounter (Signed)
Pt walked in today to report side effects of Repatha. Will call later to discuss. Pt is due for injection today.

## 2018-12-13 ENCOUNTER — Encounter (HOSPITAL_COMMUNITY)
Admission: RE | Admit: 2018-12-13 | Discharge: 2018-12-13 | Disposition: A | Discharge status: Home / Self Care | Payer: Medicare Other | Source: Ambulatory Visit | Attending: Cardiovascular Disease | Admitting: Cardiovascular Disease

## 2018-12-13 ENCOUNTER — Encounter (HOSPITAL_COMMUNITY): Payer: Medicare Other

## 2018-12-13 DIAGNOSIS — Z955 Presence of coronary angioplasty implant and graft: Secondary | ICD-10-CM | POA: Diagnosis not present

## 2018-12-13 DIAGNOSIS — Z79899 Other long term (current) drug therapy: Secondary | ICD-10-CM | POA: Diagnosis not present

## 2018-12-13 DIAGNOSIS — E785 Hyperlipidemia, unspecified: Secondary | ICD-10-CM | POA: Diagnosis not present

## 2018-12-15 ENCOUNTER — Encounter (HOSPITAL_COMMUNITY)
Admission: RE | Admit: 2018-12-15 | Discharge: 2018-12-15 | Disposition: A | Discharge status: Home / Self Care | Payer: Medicare Other | Source: Ambulatory Visit | Attending: Cardiovascular Disease | Admitting: Cardiovascular Disease

## 2018-12-15 ENCOUNTER — Telehealth: Payer: Self-pay | Admitting: Cardiovascular Disease

## 2018-12-15 ENCOUNTER — Telehealth (HOSPITAL_COMMUNITY): Payer: Self-pay | Admitting: *Deleted

## 2018-12-15 ENCOUNTER — Telehealth: Payer: Self-pay

## 2018-12-15 ENCOUNTER — Encounter (HOSPITAL_COMMUNITY): Payer: Medicare Other

## 2018-12-15 DIAGNOSIS — E785 Hyperlipidemia, unspecified: Secondary | ICD-10-CM | POA: Diagnosis not present

## 2018-12-15 DIAGNOSIS — Z79899 Other long term (current) drug therapy: Secondary | ICD-10-CM | POA: Diagnosis not present

## 2018-12-15 DIAGNOSIS — Z955 Presence of coronary angioplasty implant and graft: Secondary | ICD-10-CM

## 2018-12-15 NOTE — Telephone Encounter (Signed)
New Message   Ronnie Vazquez is calling from [facility] to obtain a diagnosis code for a claim. Please call to discuss and use case number [id number].

## 2018-12-15 NOTE — Telephone Encounter (Signed)
Pt voicing concerns related to his symptoms of back and neck pain r/t his Repatha during Cardiac Rehab this week.  Pt has previously discussed this with a pharmacist and is changing medications but still has continued concerns.  Face to face appointment with provider requested.   Cardiology office called to set up appointment. First available is 01/19/19 at 0915 with PA.  Pt called and VM left on phone detailing appointment and cancellation policy if symptoms resolve or patient does not feel he still needs appointment.

## 2018-12-15 NOTE — Telephone Encounter (Signed)
Called pt to get insurance information.  

## 2018-12-18 ENCOUNTER — Encounter (HOSPITAL_COMMUNITY): Payer: Medicare Other

## 2018-12-18 ENCOUNTER — Telehealth (HOSPITAL_COMMUNITY): Payer: Self-pay | Admitting: Cardiac Rehabilitation

## 2018-12-18 ENCOUNTER — Encounter (HOSPITAL_COMMUNITY)
Admission: RE | Admit: 2018-12-18 | Discharge: 2018-12-18 | Disposition: A | Discharge status: Home / Self Care | Payer: Medicare Other | Source: Ambulatory Visit | Attending: Cardiovascular Disease | Admitting: Cardiovascular Disease

## 2018-12-18 DIAGNOSIS — Z79899 Other long term (current) drug therapy: Secondary | ICD-10-CM | POA: Diagnosis not present

## 2018-12-18 DIAGNOSIS — Z955 Presence of coronary angioplasty implant and graft: Secondary | ICD-10-CM | POA: Diagnosis not present

## 2018-12-18 DIAGNOSIS — E785 Hyperlipidemia, unspecified: Secondary | ICD-10-CM | POA: Diagnosis not present

## 2018-12-18 NOTE — Telephone Encounter (Signed)
-----   Message from Sherren Mocha, MD sent at 12/17/2018  9:17 AM EST ----- Regarding: RE: Cardiac Rehab This is not common. I like the idea of changing him to [place]. Hopefully he will tolerate well. ----- Message ----- From: Lowell Guitar, RN Sent: 12/13/2018   1:44 PM EST To: Sherren Mocha, MD Subject: Cardiac Rehab                                  Dear Dr. Burt Knack,  Pt c/o generalized muscle aches x1-2 weeks.  Pt concerned this is related to his repatha.    Symptoms occurred following his 3rd injection.  He talked to pharmacist and cancelled the appointment for the 4th injection with plan to change him to praulent.    Are muscle aches expected with repatha?  If so, what is the usual recovery time to them to resolve?   Could his muscle aches be from another cause?  He is pleased with the improvement in his FLP results and would like to maintain this level of cardiac protection.  What would you recommend?    Thank you, Andi Hence, RN, BSN Cardiac Pulmonary Rehab

## 2018-12-18 NOTE — Telephone Encounter (Signed)
Determined that [facility] is the Rx coverage for this patient, message forwarded to [place], Patient Care Advocate Dept.

## 2018-12-22 ENCOUNTER — Encounter (HOSPITAL_COMMUNITY): Payer: Medicare Other

## 2018-12-22 ENCOUNTER — Encounter (HOSPITAL_COMMUNITY)
Admission: RE | Admit: 2018-12-22 | Discharge: 2018-12-22 | Disposition: A | Discharge status: Home / Self Care | Payer: Medicare Other | Source: Ambulatory Visit | Attending: Cardiovascular Disease | Admitting: Cardiovascular Disease

## 2018-12-22 DIAGNOSIS — Z955 Presence of coronary angioplasty implant and graft: Secondary | ICD-10-CM

## 2018-12-22 DIAGNOSIS — Z79899 Other long term (current) drug therapy: Secondary | ICD-10-CM | POA: Diagnosis not present

## 2018-12-22 DIAGNOSIS — E785 Hyperlipidemia, unspecified: Secondary | ICD-10-CM | POA: Diagnosis not present

## 2018-12-22 MED ORDER — ALIROCUMAB 75 MG/ML ~~LOC~~ SOAJ: 1.0000 "pen " | SUBCUTANEOUS | 11 refills | Status: DC

## 2018-12-22 NOTE — Telephone Encounter (Signed)
Prior authorization for Praluent has been approved. Rx sent to [place]. LMOM for pt to make him aware.

## 2018-12-22 NOTE — Telephone Encounter (Signed)
**Note De-Identified Ronnie Vazquez Obfuscation** Will forward message to the lipid clinic as according to the pts chart Repatha is the only med that a PA has been done on.

## 2018-12-22 NOTE — Telephone Encounter (Signed)
We already received an approval for this request.

## 2018-12-22 NOTE — Telephone Encounter (Signed)
Spoke with pt - he will come in today to pick up 2 Praluent samples to see if he tolerates them better than Repatha shots. He will fill out PASS application while here as well. He will call us in ~1 month to let us know how he is tolerating the Praluent.

## 2018-12-25 ENCOUNTER — Encounter (HOSPITAL_COMMUNITY): Payer: Medicare Other

## 2018-12-26 ENCOUNTER — Encounter

## 2018-12-28 DIAGNOSIS — M47892 Other spondylosis, cervical region: Secondary | ICD-10-CM | POA: Diagnosis not present

## 2018-12-28 DIAGNOSIS — M50323 Other cervical disc degeneration at C6-C7 level: Secondary | ICD-10-CM | POA: Diagnosis not present

## 2018-12-28 DIAGNOSIS — M9901 Segmental and somatic dysfunction of cervical region: Secondary | ICD-10-CM | POA: Diagnosis not present

## 2018-12-28 DIAGNOSIS — M9902 Segmental and somatic dysfunction of thoracic region: Secondary | ICD-10-CM | POA: Diagnosis not present

## 2018-12-29 ENCOUNTER — Encounter (HOSPITAL_COMMUNITY)
Admission: RE | Admit: 2018-12-29 | Discharge: 2018-12-29 | Disposition: A | Discharge status: Home / Self Care | Payer: Medicare Other | Source: Ambulatory Visit | Attending: Cardiovascular Disease | Admitting: Cardiovascular Disease

## 2018-12-29 ENCOUNTER — Encounter (HOSPITAL_COMMUNITY): Payer: Medicare Other

## 2018-12-29 DIAGNOSIS — Z955 Presence of coronary angioplasty implant and graft: Secondary | ICD-10-CM

## 2018-12-29 DIAGNOSIS — Z79899 Other long term (current) drug therapy: Secondary | ICD-10-CM | POA: Diagnosis not present

## 2018-12-29 DIAGNOSIS — E785 Hyperlipidemia, unspecified: Secondary | ICD-10-CM | POA: Diagnosis not present

## 2019-01-01 ENCOUNTER — Encounter (HOSPITAL_COMMUNITY)
Admission: RE | Admit: 2019-01-01 | Discharge: 2019-01-01 | Disposition: A | Discharge status: Home / Self Care | Payer: Medicare Other | Source: Ambulatory Visit | Attending: Cardiovascular Disease | Admitting: Cardiovascular Disease

## 2019-01-01 ENCOUNTER — Encounter (HOSPITAL_COMMUNITY): Payer: Medicare Other

## 2019-01-01 DIAGNOSIS — Z955 Presence of coronary angioplasty implant and graft: Secondary | ICD-10-CM

## 2019-01-01 DIAGNOSIS — M47892 Other spondylosis, cervical region: Secondary | ICD-10-CM | POA: Diagnosis not present

## 2019-01-01 DIAGNOSIS — M9901 Segmental and somatic dysfunction of cervical region: Secondary | ICD-10-CM | POA: Diagnosis not present

## 2019-01-01 DIAGNOSIS — M50323 Other cervical disc degeneration at C6-C7 level: Secondary | ICD-10-CM | POA: Diagnosis not present

## 2019-01-01 DIAGNOSIS — E785 Hyperlipidemia, unspecified: Secondary | ICD-10-CM | POA: Diagnosis not present

## 2019-01-01 DIAGNOSIS — Z79899 Other long term (current) drug therapy: Secondary | ICD-10-CM | POA: Diagnosis not present

## 2019-01-01 DIAGNOSIS — M9902 Segmental and somatic dysfunction of thoracic region: Secondary | ICD-10-CM | POA: Diagnosis not present

## 2019-01-03 ENCOUNTER — Encounter (HOSPITAL_COMMUNITY): Payer: Medicare Other

## 2019-01-03 ENCOUNTER — Telehealth: Payer: Self-pay | Admitting: Cardiovascular Disease

## 2019-01-03 NOTE — Telephone Encounter (Signed)
Ronnie Vazquez requests an earlier appointment because he thinks his tinnitus is worsening. He blames his Plavix. The ringing did not start until about a month after starting Plavix, but all his allergies have had about a 1 month lag time.  Rescheduled patient to see Ronnie Vazquez tomorrow morning. He was grateful for call and agrees with treatment plan.

## 2019-01-03 NOTE — Progress Notes (Signed)
Cardiology Office Note:    Date:  01/04/2019   ID:  Ronnie Vazquez, DOB [birth year], MRN [id number]  PCP:  Lucretia Kern, DO  Cardiologist:  Sherren Mocha, MD  Referring MD: Lucretia Kern, DO   Chief Complaint  Patient presents with  . Medication Problem    plavix causing ear ringing    History of Present Illness:    Ronnie Vazquez is a 73 y.o. male with a past medical history significant for CAD s/p LAD DES and PTCA of diagonal, HTN, and HLD. On 08/29/18 the patient underwent successful bifurcation PCI with DES to the proximal LAD and angioplasty of the first diagonal through the LAD stent struts on 08/29/2018.   Seen in ER 10/06/2018 for chest pain which was different than recent anginal pain leading to complex PCI. Ruled out for MI. Symptoms resolved with Pepcid.  He was last seen in the office on 11/27/18 by Dr. Burt Knack at which time he was having trouble with acid reflux, possibly exacerbated by Plavix. He was switched from pepcid to Protonix. Dr. Burt Knack plans to do exercise myoview at 1 year post PCI and anticipate stopping Plavix after one year.   Ronnie Vazquez is here today to discuss possible medication reaction. He notes that he has a long history of medication intolerances. He notes that he developed significant joint pains that he is sure that it was related to [place] as all symptoms stopped after he discontinued Repatha. So far he is doing well with Praluent. He says he know that the Plavix is important to protect his stent. He has been having a ringing in his ears that has been so loud at night that he couldn't sleep. He feels that the plavix is the culprit as it started about a month after starting Plavix  And has gotten progressively worse. He researched Plavix and found that this can be a rare side effect. He has found another person who has had the same issue. It has escalated and is now high pitched. He is an Chief Financial Officer and very analytical.  He has appt with ENT today also to discuss,  Dr Lucia Gaskins.   Past Medical History:  Diagnosis Date  . Chicken pox    hx  . Coronary artery disease    08/29/18 PCI/DES with bifurcation from LAD into 1st diag.   Marland Kitchen Hyperlipidemia   . Left shoulder pain 02/02/2013  . Microscopic hematuria 02/02/2013  . Vision loss of left eye     Past Surgical History:  Procedure Laterality Date  . COLONOSCOPY    . CORONARY BALLOON ANGIOPLASTY N/A 08/29/2018   Procedure: CORONARY BALLOON ANGIOPLASTY;  Surgeon: Sherren Mocha, MD;  Location: [facility];  Service: Cardiovascular;  Laterality: N/A;  . CORONARY STENT INTERVENTION N/A 08/29/2018   Procedure: CORONARY STENT INTERVENTION;  Surgeon: Sherren Mocha, MD;  Location: [facility];  Service: Cardiovascular;  Laterality: N/A;  . LEFT HEART CATH AND CORONARY ANGIOGRAPHY N/A 08/08/2018   Procedure: LEFT HEART CATH AND CORONARY ANGIOGRAPHY;  Surgeon: Lorretta Harp, MD;  Location: [facility];  Service: Cardiovascular;  Laterality: N/A;  . TONSILLECTOMY AND ADENOIDECTOMY  1959    Current Medications: Current Meds  Medication Sig  . acetaminophen (TYLENOL) 325 MG tablet Take 650 mg by mouth at bedtime as needed (for sleep).   . Alirocumab (PRALUENT) 75 MG/ML SOAJ Inject 1 pen into the skin every 14 (fourteen) days.  Marland Kitchen aspirin 81 MG tablet Take 81 mg by  mouth daily.  . cetirizine (ZYRTEC) 10 MG tablet Take 10 mg by mouth daily as needed for allergies.  Marland Kitchen clopidogrel (PLAVIX) 75 MG tablet Take 1 tablet (75 mg total) by mouth daily.  . nitroGLYCERIN (NITROSTAT) 0.4 MG SL tablet Place 1 tablet (0.4 mg total) under the tongue every 5 (five) minutes as needed for chest pain.  . pantoprazole (PROTONIX) 40 MG tablet Take 1 tablet (40 mg total) by mouth daily.     Allergies:   Repatha [evolocumab]   Social History   Socioeconomic History  . Marital status: Married    Spouse name: Not on file  . Number of children: Not on file  . Years of education: Not on file  . Highest education  level: Not on file  Occupational History  . Not on file  Social Needs  . Financial resource strain: Not on file  . Food insecurity:    Worry: Not on file    Inability: Not on file  . Transportation needs:    Medical: Not on file    Non-medical: Not on file  Tobacco Use  . Smoking status: Never Smoker  . Smokeless tobacco: Never Used  Substance and Sexual Activity  . Alcohol use: Yes    Alcohol/week: 2.0 standard drinks    Types: 2 Glasses of wine per week  . Drug use: No  . Sexual activity: Not on file  Lifestyle  . Physical activity:    Days per week: Not on file    Minutes per session: Not on file  . Stress: Not on file  Relationships  . Social connections:    Talks on phone: Not on file    Gets together: Not on file    Attends religious service: Not on file    Active member of club or organization: Not on file    Attends meetings of clubs or organizations: Not on file    Relationship status: Not on file  Other Topics Concern  . 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 There is no history of Colon cancer, Esophageal cancer, Pancreatic cancer, Rectal cancer, Stomach cancer, or Heart disease. ROS:   Please see the history of present illness.     All other systems reviewed and are negative.  EKGs/Labs/Other Studies Reviewed:    The following studies were reviewed today:  Percutaneous coronary intervention 08/29/2018 Successful bifurcation PCI with stenting of the proximal LAD using a 3.0x30 mm Resolute Onyx DES and angioplasty of the first diagonal (through the LAD stent struts) with a 3.0 mm balloon    Pre CABG Dopplers 08/10/18 Final Interpretation: Right Carotid: Velocities in  the right ICA are consistent with a 1-39% stenosis. Left Carotid: Velocities in the left ICA are consistent with a 1-39% stenosis. Right ABI: Pedal artery waveform within normal limits. Left ABI: Pedal artery waveform within normal limits.  Cardiac Catheterization8/13/19  Prox LAD-1 lesion is 90% stenosed.  Prox LAD-2 lesion is 70% stenosed.  The left ventricular systolic function is normal.  LV end diastolic pressure is normal.  The left ventricular ejection fraction is 55-65% by  visual estimate.  Nuclear stress test8/9/19  The left ventricular ejection fraction is mildly decreased (45-54%).  Nuclear stress EF: 54%.  ST segment depression was noted during stress in the V5, V6, II, III, aVF and I leads.  Blood pressure demonstrated a hypotensive response to exercise.  Defect 1: There is a large defect of severe severity present in the mid anterior, mid anteroseptal, apical anterior, apical septal and apex location.  Findings consistent with ischemia.  This is a high risk study.  Echo 08/13/2013 Moderate LVH, EF 65   EKG:  EKG is not ordered today.   Recent Labs: 09/29/2018: ALT 21 11/28/2018: BUN 21; Creatinine, Ser 1.36; Hemoglobin 15.2; Platelets 225.0; Potassium 4.8; Sodium 140   Recent Lipid Panel    Component Value Date/Time   CHOL 113 11/28/2018 0836   CHOL 145 09/29/2018 0000   TRIG 161.0 (H) 11/28/2018 0836   HDL 34.90 (L) 11/28/2018 0836   HDL 39 (L) 09/29/2018 0000   CHOLHDL 3 11/28/2018 0836   VLDL 32.2 11/28/2018 0836   LDLCALC 45 11/28/2018 0836   LDLCALC 75 09/29/2018 0000   LDLDIRECT 117.0 08/12/2017 0921    Physical Exam:    VS:  BP (!) 88/62   Pulse 87   Ht 5' 9.25" (1.759 m)   Wt 183 lb 12.8 oz (83.4 kg)   SpO2 95%   BMI 26.95 kg/m     Wt Readings from Last 3 Encounters:  01/04/19 183 lb 12.8 oz (83.4 kg)  11/28/18 188 lb 4.8 oz (85.4 kg)  11/27/18 188 lb 12.8 oz (85.6 kg)     Physical Exam  Constitutional: He is oriented  to person, place, and time. He appears well-developed and well-nourished.  HENT:  Head: Normocephalic and atraumatic.  Neck: Normal range of motion. Neck supple. No JVD present.  Cardiovascular: Normal rate, regular rhythm, normal heart sounds and intact distal pulses. Exam reveals no gallop and no friction rub.  No murmur heard. Pulmonary/Chest: Effort normal and breath sounds normal. No respiratory distress. He has no wheezes. He has no rales.  Abdominal: Soft. Bowel sounds are normal.  Musculoskeletal: Normal range of motion.        General: No deformity or edema.  Neurological: He is alert and oriented to person, place, and time.  Skin: Skin is warm and dry.  Psychiatric: He has a normal mood and affect. His behavior is normal. Judgment and thought content normal.  Vitals reviewed.   ASSESSMENT:    1. Adverse effect of drug, initial encounter   2. 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 Pure hypercholesterolemia    PLAN:    In order of problems listed above:  1. Medication adverse effect -Pt reports worsening tinnitus that he relates to Plavix. He is not taking many medications and none others that would likely be the culprit. After discussion with Dr. Burt Knack there are 2 options: continue plavix to complete 6 months and then can discontinue or switch to [place] and see how he tolerates that.  -after speaking to Ronnie Vazquez he prefers to continue Plavix for now and will try stopping at 6 months from stent 3/3 to see if tinnitis gets better. He has decided that he does not want to try Brilinta as he is afraid of more different side effects.  -He plans to review with Ronnie Vazquez at his next appt.   2. CAD: S/P complex LAD/diagonal with DES to LAD and PTCA of diagonal through the stent strut.  -On  aspirin, Plavix and PCSK9-I. -Pt is active and exercises and doing very well without any exertional symptoms.   3. Hyperlipidemia: Hx of statin intolerance. Was switched from  [place] to Praluent due to complaints of muscle aches and so far is tolerating it well.   Medication Adjustments/Labs and Tests Ordered: Current medicines are reviewed at length with the patient today.  Concerns regarding medicines are outlined above. Labs and tests ordered and medication changes are outlined in the patient instructions below:  Patient Instructions  Medication Instructions:  Your physician recommends that you continue on your current medications as directed. Please refer to the Current Medication list given to you today.  If you need a refill on your cardiac medications before your next appointment, please call your pharmacy.   Lab work: None  If you have labs (blood work) drawn today and your tests are completely normal, you will receive your results only by: Marland Kitchen MyChart Message (if you have MyChart) OR . A paper copy in the mail If you have any lab test that is abnormal or we need to change your treatment, we will call you to review the results.  Testing/Procedures: None  Follow-Up: Keep your follow up appointment with Richardson Dopp PA on 01/19/19 @ 9:15 am   Any Other Special Instructions Will Be Listed Below (If Applicable).  Pecolia Ades NP will discuss your medications with Dr. Burt Knack and get back with you.       Signed, Daune Perch, NP  01/04/2019 10:17 AM    [place]

## 2019-01-03 NOTE — Telephone Encounter (Signed)
New Message   Pt c/o medication issue:  1. Name of Medication: Plavix   2. How are you currently taking this medication (dosage and times per day)?   3. Are you having a reaction (difficulty breathing--STAT)?   4. What is your medication issue? Patient is calling because he is experiencing terrible ringing in the ears to the point that it is causing him not being able to sleep last night at all. He hopes to be seen sooner than his appt

## 2019-01-04 ENCOUNTER — Encounter: Payer: Self-pay | Admitting: Cardiology

## 2019-01-04 ENCOUNTER — Ambulatory Visit (INDEPENDENT_AMBULATORY_CARE_PROVIDER_SITE_OTHER): Payer: Medicare Other | Admitting: Cardiology

## 2019-01-04 ENCOUNTER — Telehealth: Payer: Self-pay | Admitting: Cardiology

## 2019-01-04 VITALS — BP 88/62 | HR 87 | Ht 69.25 in | Wt 183.8 lb

## 2019-01-04 DIAGNOSIS — H9313 Tinnitus, bilateral: Secondary | ICD-10-CM | POA: Diagnosis not present

## 2019-01-04 DIAGNOSIS — E7800 Pure hypercholesterolemia, unspecified: Secondary | ICD-10-CM

## 2019-01-04 DIAGNOSIS — T50905A Adverse effect of unspecified drugs, medicaments and biological substances, initial encounter: Secondary | ICD-10-CM

## 2019-01-04 DIAGNOSIS — I25118 Atherosclerotic heart disease of native coronary artery with other forms of angina pectoris: Secondary | ICD-10-CM

## 2019-01-04 NOTE — Patient Instructions (Signed)
Medication Instructions:  Your physician recommends that you continue on your current medications as directed. Please refer to the Current Medication list given to you today.  If you need a refill on your cardiac medications before your next appointment, please call your pharmacy.   Lab work: None  If you have labs (blood work) drawn today and your tests are completely normal, you will receive your results only by: Marland Kitchen MyChart Message (if you have MyChart) OR . A paper copy in the mail If you have any lab test that is abnormal or we need to change your treatment, we will call you to review the results.  Testing/Procedures: None  Follow-Up: Keep your follow up appointment with Richardson Dopp PA on 01/19/19 @ 9:15 am   Any Other Special Instructions Will Be Listed Below (If Applicable).  Pecolia Ades NP will discuss your medications with Dr. Burt Knack and get back with you.

## 2019-01-04 NOTE — Progress Notes (Signed)
Cardiac Individual Treatment Plan  Patient Details  Name: AMONTE BROOKOVER MRN: [id number] Date of Birth: Jul 02, 1946 Referring Provider:     [place] from 10/17/2018 in [place]  Referring Provider  Sherren Mocha, MD      Initial Encounter Date:    CARDIAC REHAB PHASE II ORIENTATION from 10/17/2018 in [place]  Date  10/17/18      Visit Diagnosis: 08/29/2018 Stented coronary artery  Patient's Home Medications on Admission:  Current Outpatient Medications:  .  acetaminophen (TYLENOL) 325 MG tablet, Take 650 mg by mouth at bedtime as needed (for sleep). , Disp: , Rfl:  .  Alirocumab (PRALUENT) 75 MG/ML SOAJ, Inject 1 pen into the skin every 14 (fourteen) days., Disp: 2 pen, Rfl: 11 .  aspirin 81 MG tablet, Take 81 mg by mouth daily., Disp: , Rfl:  .  cetirizine (ZYRTEC) 10 MG tablet, Take 10 mg by mouth daily as needed for allergies., Disp: , Rfl:  .  clopidogrel (PLAVIX) 75 MG tablet, Take 1 tablet (75 mg total) by mouth daily., Disp: 90 tablet, Rfl: 3 .  nitroGLYCERIN (NITROSTAT) 0.4 MG SL tablet, Place 1 tablet (0.4 mg total) under the tongue every 5 (five) minutes as needed for chest pain., Disp: 25 tablet, Rfl: 3 .  pantoprazole (PROTONIX) 40 MG tablet, Take 1 tablet (40 mg total) by mouth daily., Disp: 90 tablet, Rfl: 3  Past Medical History: Past Medical History:  Diagnosis Date  . Chicken pox    hx  . Coronary artery disease    08/29/18 PCI/DES with bifurcation from LAD into 1st diag.   Marland Kitchen Hyperlipidemia   . Left shoulder pain 02/02/2013  . Microscopic hematuria 02/02/2013  . Vision loss of left eye     Tobacco Use: Social History   Tobacco Use  Smoking Status Never Smoker  Smokeless Tobacco Never Used    Labs: Recent Review Flowsheet Data    Labs for ITP Cardiac and Pulmonary Rehab Latest Ref Rng & Units 04/05/2016 08/12/2017 08/10/2018 09/29/2018 11/28/2018   Cholestrol 0 -  200 mg/dL 247(H) 224(H) - 145 113   LDLCALC 0 - 99 mg/dL - - - 75 45   LDLDIRECT mg/dL 102.0 117.0 - - -   HDL >39.00 mg/dL 40.20 42.00 - 39(L) 34.90(L)   Trlycerides 0.0 - 149.0 mg/dL 323.0(H) 301.0(H) - 157(H) 161.0(H)   Hemoglobin A1c 4.8 - 5.6 % - 5.5 5.4 - -   PHART 7.350 - 7.450 - - 7.472(H) - -   PCO2ART 32.0 - 48.0 mmHg - - 33.9 - -   HCO3 20.0 - 28.0 mmol/L - - 24.5 - -   O2SAT % - - 87.6 - -      Capillary Blood Glucose: No results found for: GLUCAP   Exercise Target Goals: Exercise Program Goal: Individual exercise prescription set using results from initial 6 min walk test and THRR while considering  patient's activity barriers and safety.   Exercise Prescription Goal: Initial exercise prescription builds to 30-45 minutes a day of aerobic activity, 2-3 days per week.  Home exercise guidelines will be given to patient during program as part of exercise prescription that the participant will acknowledge.  Activity Barriers & Risk Stratification: Activity Barriers & Cardiac Risk Stratification - 10/17/18 0937      Activity Barriers & Cardiac Risk Stratification   Activity Barriers  Joint Problems   Left Shoulder Pain   Cardiac Risk Stratification  High       6 Minute Walk: 6 Minute Walk    Row Name 10/17/18 0906 10/18/18 1104       6 Minute Walk   Phase  Initial  -    Distance  1826 feet  1826 feet    Walk Time  6 minutes  6 minutes    # of Rest Breaks  0  -    MPH  3.46  -    METS  3.67  -    RPE  13  13    VO2 Peak  -  12.86    Symptoms  No  No    Resting HR  79 bpm  79 bpm    Resting BP  94/60  94/60    Max Ex. HR  102 bpm  102 bpm    Max Ex. BP  102/62  102/62    2 Minute Post BP  92/60  -       Oxygen Initial Assessment:   Oxygen Re-Evaluation:   Oxygen Discharge (Final Oxygen Re-Evaluation):   Initial Exercise Prescription: Initial Exercise Prescription - 10/17/18 0900      Date of Initial Exercise RX and Referring Provider   Date   10/17/18    Referring Provider  Sherren Mocha, MD    Expected Discharge Date  01/26/19      Bike   Level  1.2    Minutes  10    METs  3.6      NuStep   Level  3    SPM  85    Minutes  10    METs  3.6      Track   Laps  15    Minutes  10    METs  3.6      Prescription Details   Frequency (times per week)  3    Duration  Progress to 30 minutes of continuous aerobic without signs/symptoms of physical distress      Intensity   THRR 40-80% of Max Heartrate  60-121    Ratings of Perceived Exertion  11-13      Progression   Progression  Continue to progress workloads to maintain intensity without signs/symptoms of physical distress.      Resistance Training   Training Prescription  Yes    Weight  3 lbs    Reps  10-15       Perform Capillary Blood Glucose checks as needed.  Exercise Prescription Changes: Exercise Prescription Changes    Row Name 10/23/18 1300 11/06/18 1600 11/13/18 1410 11/22/18 1036 11/27/18 1354     Response to Exercise   Blood Pressure (Admit)  122/73  100/70  108/66  114/82  116/60   Blood Pressure (Exercise)  126/70  124/72  138/62  110/72  124/70   Blood Pressure (Exit)  100/68  102/60  102/68  98/62  108/74   Heart Rate (Admit)  90 bpm  76 bpm  68 bpm  72 bpm  84 bpm   Heart Rate (Exercise)  107 bpm  97 bpm  95 bpm  94 bpm  102 bpm   Heart Rate (Exit)  85 bpm  82 bpm  60 bpm  76 bpm  84 bpm   Rating of Perceived Exertion (Exercise)  _0 Perceived Dyspnea (Exercise)  0  0  0  0  0   Symptoms  None  None  None  None  None   Comments  Pt oriented to exercise equipment  None  Added Treadmill to Exercise Prescription   None  None   Duration  Progress to 30 minutes of  aerobic without signs/symptoms of physical distress  Progress to 30 minutes of  aerobic without signs/symptoms of physical distress  Progress to 30 minutes of  aerobic without signs/symptoms of physical distress  Progress to 30 minutes of  aerobic without  signs/symptoms of physical distress  Continue with 30 min of aerobic exercise without signs/symptoms of physical distress.   Intensity  THRR New  THRR unchanged  THRR unchanged  THRR unchanged  THRR unchanged     Progression   Progression  Continue to progress workloads to maintain intensity without signs/symptoms of physical distress.  Continue to progress workloads to maintain intensity without signs/symptoms of physical distress.  Continue to progress workloads to maintain intensity without signs/symptoms of physical distress.  Continue to progress workloads to maintain intensity without signs/symptoms of physical distress.  Continue to progress workloads to maintain intensity without signs/symptoms of physical distress.   Average METs  3.24  3.71  3.8  3.83  4.12     Resistance Training   Training Prescription  Yes  Yes  Yes  No  No   Weight  3lbs  5lbs  5lbs  -  -   Reps  10-15  10-15  10-15  -  -   Time  10 Minutes  10 Minutes  10 Minutes  -  -     Interval Training   Interval Training  No  No  No  No  No     Treadmill   MPH  -  -  2.8  3.1  3.1   Grade  -  -  _0 Minutes  -  -  _1 METs  -  -  3.91  4.23  4.23     Bike   Level  1.2  1.2  1.2  1.2  1.5   Minutes  _2 METs  3.61  3.61  3.66  3.66  4.33     NuStep   Level  _3 SPM  80  95  95  95  95   Minutes  _4 METs  2.5  3.7  3.8  3.6  3.8     Track   Laps  15  16  -  -  -   Minutes  10  10  -  -  -   METs  3.61  3.76  -  -  -     Home Exercise Plan   Plans to continue exercise at  -  -  [employer] (comment) [employer] (comment) [employer] (comment) [employer]   Frequency  -  -  Add 2 additional days to program exercise sessions.  Add 2 additional days to program exercise sessions.  Add 2 additional days to program exercise sessions.   Initial Home Exercises Provided  -  -  11/08/18  11/08/18  11/08/18   Row  Name 12/04/18 1600 12/13/18 1038 12/22/18 1039 01/01/19 1437       Response to Exercise   Blood Pressure (Admit)  100/60  110/60  100/70  100/60    Blood Pressure (Exercise)  116/72  130/70  132/74  118/62    Blood Pressure (Exit)  98/70  102/60  94/68  108/68    Heart Rate (Admit)  87 bpm  69 bpm  83 bpm  78 bpm    Heart Rate (Exercise)  104 bpm  88 bpm  102 bpm  106 bpm    Heart Rate (Exit)  86 bpm  69 bpm  80 bpm  78 bpm    Rating of Perceived Exertion (Exercise)  _0 Perceived Dyspnea (Exercise)  0  0  0  0    Symptoms  None  Pt reported muscle pain  None  None    Comments  None  None  None  None    Duration  Continue with 30 min of aerobic exercise without signs/symptoms of physical distress.  Continue with 30 min of aerobic exercise without signs/symptoms of physical distress.  Continue with 30 min of aerobic exercise without signs/symptoms of physical distress.  Continue with 30 min of aerobic exercise without signs/symptoms of physical distress.    Intensity  THRR unchanged  THRR unchanged  THRR unchanged  THRR unchanged      Progression   Progression  Continue to progress workloads to maintain intensity without signs/symptoms of physical distress.  Continue to progress workloads to maintain intensity without signs/symptoms of physical distress.  Continue to progress workloads to maintain intensity without signs/symptoms of physical distress.  Continue to progress workloads to maintain intensity without signs/symptoms of physical distress.    Average METs  4.4  4.11  4.34  4.52      Resistance Training   Training Prescription  Yes  No  Yes  Yes    Weight  6lbs  -  6lbs  6lbs    Reps  10-15  -  10-15  10-15    Time  10 Minutes  -  10 Minutes  10 Minutes      Interval Training   Interval Training  No  No  No  No      Treadmill   MPH  3.5  3.5  3.5  3.7    Grade  _1 Minutes  _2 METs  4.65  4.65  4.65  4.41      Bike   Level  1.5  1.5   1.5  1.5    Minutes  _3 METs  4.35  4.35  4.37  4.37      NuStep   Level  _4 SPM  105  105  105  105    Minutes  _5 METs  4.2  3.3  4  4.3      Home Exercise Plan   Plans to continue exercise at  [employer] (comment) Scientist, research (medical) (comment) Scientist, research (medical) (comment) Scientist, research (medical) (comment) [employer]    Frequency  Add 2 additional days to program exercise sessions.  Add 2 additional days to program exercise sessions.  Add 2 additional days to program exercise sessions.  Add 2 additional days to program exercise sessions.    Initial Home Exercises Provided  11/08/18  11/08/18  11/08/18  11/08/18       Exercise Comments: Exercise Comments    Row Name 10/23/18 1327 11/08/18 1423 12/07/18 1401 01/01/19 1631     Exercise Comments  Pt's first day of Cardiac Rehab today. Pt responded well to exercise prescription. Pt reported that he has been having low back pain. Pt did not report any problems with exercise today. Will continue to monitor and progress pt as tolerated.   Reviewed HEP with pt. Pt verbalized understanding. Will continue to follow up with pt regarding home exercise adherence.   Reviewed METs and Goals. Pt continuing to respond well to exercise workloads. Will continue to progress workloads as tolerated.  Reviewed current MET level and goals with pt. Pt is consistent with MET level, will work with pt to increase workloads and current MET level. Will continue to monitor.        Exercise Goals and Review: Exercise Goals    Row Name 10/17/18 1014             Exercise Goals   Increase Physical Activity  Yes       Intervention  Provide advice, education, support and counseling about physical activity/exercise needs.;Develop an individualized exercise prescription for aerobic and resistive training based on initial evaluation findings, risk stratification, comorbidities  and participant's personal goals.       Expected Outcomes  Short Term: Attend rehab on a regular basis to increase amount of physical activity.       Increase Strength and Stamina  Yes       Intervention  Provide advice, education, support and counseling about physical activity/exercise needs.;Develop an individualized exercise prescription for aerobic and resistive training based on initial evaluation findings, risk stratification, comorbidities and participant's personal goals.       Expected Outcomes  Short Term: Increase workloads from initial exercise prescription for resistance, speed, and METs.       Able to understand and use rate of perceived exertion (RPE) scale  Yes       Intervention  Provide education and explanation on how to use RPE scale       Expected Outcomes  Short Term: Able to use RPE daily in rehab to express subjective intensity level;Long Term:  Able to use RPE to guide intensity level when exercising independently       Knowledge and understanding of Target Heart Rate Range (THRR)  Yes       Intervention  Provide education and explanation of THRR including how the numbers were predicted and where they are located for reference       Expected Outcomes  Short Term: Able to state/look up THRR;Long Term: Able to use THRR to govern intensity when exercising independently;Short Term: Able to use daily as guideline for intensity in rehab       Able to check pulse independently  Yes       Intervention  Provide education and demonstration on how to check pulse in carotid and radial arteries.;Review the importance of being able to check your own pulse for safety during independent exercise       Expected Outcomes  Short Term: Able to explain why pulse checking is important during independent exercise       Understanding of Exercise Prescription  Yes       Intervention  Provide education, explanation, and written materials on patient's individual exercise prescription       Expected  Outcomes  Short Term: Able to explain program exercise prescription;Long  Term: Able to explain home exercise prescription to exercise independently          Exercise Goals Re-Evaluation : Exercise Goals Re-Evaluation    Row Name 11/08/18 1426 12/07/18 1416 01/01/19 1638         Exercise Goal Re-Evaluation   Exercise Goals Review  Increase Physical Activity;Able to understand and use rate of perceived exertion (RPE) scale;Knowledge and understanding of Target Heart Rate Range (THRR);Understanding of Exercise Prescription;Increase Strength and Stamina;Able to check pulse independently  Increase Physical Activity;Understanding of Exercise Prescription  Increase Physical Activity;Understanding of Exercise Prescription     Comments  Reviewed HEP with pt. Also reviewed THRR, RPE Scale, weather conditions, endpoints of exercise, NTG use, warmup and cool down.   Pt is able to exercise 30 minutes with no difficulty. Pt is tolerating treadmill well since adding to exercise prescription. Will work to increase pt to 6lbs handweights.   Spoke with pt regarding exercise at [employer]. Pt states he has not returned to intensity of exercise he was doing prior to heart event. Pt states he is not comfortable. Advised pt that rehab would be the setting to increase intensity to watch for possible symptoms. Will continue to work with pt to increase workloads and reach goals.      Expected Outcomes  Pt will continue to exercise at local [place] 2-3 days a week, 30-45 minutes. Pt will continue to increase cardiorespiratory and muscular fitness. Will continue to monitor.   Pt is continuing to visit [employer] 2x a week on weekends. Will follow up with pt regarding strength training. One of pt's goals is to return to lifting weights.   Pt will continue to increase cardiovascular strength. Will continue to exercise 2 days a week at [employer], 30-45 minutes.         Discharge Exercise  Prescription (Final Exercise Prescription Changes): Exercise Prescription Changes - 01/01/19 1437      Response to Exercise   Blood Pressure (Admit)  100/60    Blood Pressure (Exercise)  118/62    Blood Pressure (Exit)  108/68    Heart Rate (Admit)  78 bpm    Heart Rate (Exercise)  106 bpm    Heart Rate (Exit)  78 bpm    Rating of Perceived Exertion (Exercise)  11    Perceived Dyspnea (Exercise)  0    Symptoms  None    Comments  None    Duration  Continue with 30 min of aerobic exercise without signs/symptoms of physical distress.    Intensity  THRR unchanged      Progression   Progression  Continue to progress workloads to maintain intensity without signs/symptoms of physical distress.    Average METs  4.52      Resistance Training   Training Prescription  Yes    Weight  6lbs    Reps  10-15    Time  10 Minutes      Interval Training   Interval Training  No      Treadmill   MPH  3.7    Grade  2    Minutes  10    METs  4.41      Bike   Level  1.5    Minutes  10    METs  4.37      NuStep   Level  5    SPM  105    Minutes  10    METs  4.3  Home Exercise Plan   Plans to continue exercise at  [employer] (comment)   [employer]   Frequency  Add 2 additional days to program exercise sessions.    Initial Home Exercises Provided  11/08/18       Nutrition:  Target Goals: Understanding of nutrition guidelines, daily intake of sodium <1567m, cholesterol <2022m calories 30% from fat and 7% or less from saturated fats, daily to have 5 or more servings of fruits and vegetables.  Biometrics: Pre Biometrics - 10/17/18 0908      Pre Biometrics   Height  5' 9.25" (1.759 m)    Weight  86.8 kg    Waist Circumference  40.5 inches    Hip Circumference  40 inches    Waist to Hip Ratio  1.01 %    BMI (Calculated)  28.05    Triceps Skinfold  28 mm    % Body Fat  30.4 %    Grip Strength  37 kg    Flexibility  8 in    Single Leg Stand  7 seconds         Nutrition Therapy Plan and Nutrition Goals: Nutrition Therapy & Goals - 10/17/18 0918      Nutrition Therapy   Diet  heart healthy      Personal Nutrition Goals   Nutrition Goal  Pt to identify and limit food sources of saturated fat, trans fat, refined carbohydrates and sodium    Personal Goal #2  pt to identify food quantities necessary to achieve weight loss of 6-24 lbs at graduation from cardiac rehab. Goal weight of less than 180 lbs desired      Intervention Plan   Intervention  Prescribe, educate and counsel regarding individualized specific dietary modifications aiming towards targeted core components such as weight, hypertension, lipid management, diabetes, heart failure and other comorbidities.    Expected Outcomes  Short Term Goal: Understand basic principles of dietary content, such as calories, fat, sodium, cholesterol and nutrients.;Long Term Goal: Adherence to prescribed nutrition plan.       Nutrition Assessments: Nutrition Assessments - 10/17/18 0920      MEDFICTS Scores   Pre Score  18       Nutrition Goals Re-Evaluation:   Nutrition Goals Re-Evaluation:   Nutrition Goals Discharge (Final Nutrition Goals Re-Evaluation):   Psychosocial: Target Goals: Acknowledge presence or absence of significant depression and/or stress, maximize coping skills, provide positive support system. Participant is able to verbalize types and ability to use techniques and skills needed for reducing stress and depression.  Initial Review & Psychosocial Screening: Initial Psych Review & Screening - 10/17/18 0853      Initial Review   Current issues with  None Identified      Family Dynamics   Good Support System?  Yes      Barriers   Psychosocial barriers to participate in program  There are no identifiable barriers or psychosocial needs.      Screening Interventions   Interventions  Encouraged to exercise       Quality of Life Scores: Quality of Life - 10/17/18  0854      Quality of Life   Select  Quality of Life      Quality of Life Scores   Health/Function Pre  23.5 %    Socioeconomic Pre  26 %    Psych/Spiritual Pre  24.6 %    Family Pre  27.6 %    GLOBAL Pre  24.9 %  Scores of 19 and below usually indicate a poorer quality of life in these areas.  A difference of  2-3 points is a clinically meaningful difference.  A difference of 2-3 points in the total score of the Quality of Life Index has been associated with significant improvement in overall quality of life, self-image, physical symptoms, and general health in studies assessing change in quality of life.  PHQ-9: Recent Review Flowsheet Data    Depression screen [facility] 2/9 11/28/2018 11/28/2018 10/23/2018 08/12/2017 12/01/2015   Decreased Interest 0 0 0 0 0   Down, Depressed, Hopeless 0 0 0 0 0   PHQ - 2 Score 0 0 0 0 0   Altered sleeping 0 - - - -   Tired, decreased energy 0 - - - -   Change in appetite 0 - - - -   Feeling bad or failure about yourself  0 - - - -   Trouble concentrating 0 - - - -   Moving slowly or fidgety/restless 0 - - - -   Suicidal thoughts 0 - - - -   PHQ-9 Score 0 - - - -     Interpretation of Total Score  Total Score Depression Severity:  1-4 = Minimal depression, 5-9 = Mild depression, 10-14 = Moderate depression, 15-19 = Moderately severe depression, 20-27 = Severe depression   Psychosocial Evaluation and Intervention: Psychosocial Evaluation - 01/03/19 1114      Psychosocial Evaluation & Interventions   Interventions  Encouraged to exercise with the program and follow exercise prescription    Comments  No pyschosocial needs identified. No intervention necessary.  Edgerrin enjoys Marketing executive.     Expected Outcomes  Smaran will continue to exhibit a positive outlook with good coping skills.     Continue Psychosocial Services   No Follow up required       Psychosocial Re-Evaluation: Psychosocial Re-Evaluation    [place] Name 11/16/18 1226 12/07/18 1601  01/04/19 1508         Psychosocial Re-Evaluation   Current issues with  None Identified  None Identified  None Identified     Comments  No psychosocial needs identified. No interventions.   No psychosocial needs identified. No interventions.   No psychosocial needs identified. No interventions.      Expected Outcomes  Lowen will maintain a positive outlook with good coping skills.   Guthrie will maintain a positive outlook with good coping skills.   Keeshawn will maintain a positive outlook with good coping skills.      Interventions  Encouraged to attend Cardiac Rehabilitation for the exercise  Encouraged to attend Cardiac Rehabilitation for the exercise  Encouraged to attend Cardiac Rehabilitation for the exercise     Continue Psychosocial Services   No Follow up required  No Follow up required  No Follow up required        Psychosocial Discharge (Final Psychosocial Re-Evaluation): Psychosocial Re-Evaluation - 01/04/19 1508      Psychosocial Re-Evaluation   Current issues with  None Identified    Comments  No psychosocial needs identified. No interventions.     Expected Outcomes  Geronimo will maintain a positive outlook with good coping skills.     Interventions  Encouraged to attend Cardiac Rehabilitation for the exercise    Continue Psychosocial Services   No Follow up required       Vocational Rehabilitation: Provide vocational rehab assistance to qualifying candidates.   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 Participant will state understanding/return demonstration of topics presented.  Learning Barriers/Preferences: Learning Barriers/Preferences - 10/17/18 0941      Learning Barriers/Preferences    Learning Barriers  Sight   Loss of vision left eye   Learning Preferences  Written Material       Education Topics: Count Your Pulse:  -Group instruction provided by verbal instruction, demonstration, patient participation and written materials to support subject.  Instructors address importance of being able to find your pulse and how to count your pulse when at home without a heart monitor.  Patients get hands on experience counting their pulse with staff help and individually.   CARDIAC REHAB PHASE II EXERCISE from 12/15/2018 in [place]  Date  12/15/18  Educator  RN  Instruction Review Code  2- Demonstrated Understanding      Heart Attack, Angina, and Risk Factor Modification:  -Group instruction provided by verbal instruction, video, and written materials to support subject.  Instructors address signs and symptoms of angina and heart attacks.    Also discuss risk factors for heart disease and how to make changes to improve heart health risk factors.   CARDIAC REHAB PHASE II EXERCISE from 12/15/2018 in [place]  Date  12/06/18  Educator  RN  Instruction Review Code  2- Demonstrated Understanding      Functional Fitness:  -Group instruction provided by verbal instruction, demonstration, patient participation, and written materials to support subject.  Instructors address safety measures for doing things around the house.  Discuss how to get up and down off the floor, how to pick things up properly, how to safely get out of a chair without assistance, and balance training.   CARDIAC REHAB PHASE II EXERCISE from 12/15/2018 in [facility]  Date  11/10/18  Instruction Review Code  3- Needs Reinforcement [Arrived late, missed first part of class.]      Meditation and Mindfulness:  -Group instruction provided by verbal instruction, patient participation, and written materials to support  subject.  Instructor addresses importance of mindfulness and meditation practice to help reduce stress and improve awareness.  Instructor also leads participants through a meditation exercise.    Stretching for Flexibility and Mobility:  -Group instruction provided by verbal instruction, patient participation, and written materials to support subject.  Instructors lead participants through series of stretches that are designed to increase flexibility thus improving mobility.  These stretches are additional exercise for major muscle groups that are typically performed during regular warm up and cool down.   Hands Only CPR:  -Group verbal, video, and participation provides a basic overview of AHA guidelines for community CPR. Role-play of emergencies allow participants the opportunity to practice calling for help and chest compression technique with discussion of AED use.   Hypertension: -Group verbal and written instruction that provides a basic overview of hypertension including the most recent diagnostic guidelines, risk factor reduction with self-care instructions and medication management.    Nutrition I class: Heart Healthy Eating:  -Group instruction provided by PowerPoint slides, verbal discussion, and written materials to support subject matter. The instructor gives an explanation and review of the Therapeutic Lifestyle Changes diet recommendations, which includes a discussion on lipid goals, dietary  fat, sodium, fiber, plant stanol/sterol esters, sugar, and the components of a well-balanced, healthy diet.   Nutrition II class: Lifestyle Skills:  -Group instruction provided by PowerPoint slides, verbal discussion, and written materials to support subject matter. The instructor gives an explanation and review of label reading, grocery shopping for heart health, heart healthy recipe modifications, and ways to make healthier choices when eating out.   Diabetes Question & Answer:  -Group  instruction provided by PowerPoint slides, verbal discussion, and written materials to support subject matter. The instructor gives an explanation and review of diabetes co-morbidities, pre- and post-prandial blood glucose goals, pre-exercise blood glucose goals, signs, symptoms, and treatment of hypoglycemia and hyperglycemia, and foot care basics.   Diabetes Blitz:  -Group instruction provided by PowerPoint slides, verbal discussion, and written materials to support subject matter. The instructor gives an explanation and review of the physiology behind type 1 and type 2 diabetes, diabetes medications and rational behind using different medications, pre- and post-prandial blood glucose recommendations and Hemoglobin A1c goals, diabetes diet, and exercise including blood glucose guidelines for exercising safely.    Portion Distortion:  -Group instruction provided by PowerPoint slides, verbal discussion, written materials, and food models to support subject matter. The instructor gives an explanation of serving size versus portion size, changes in portions sizes over the last 20 years, and what consists of a serving from each food group.   Stress Management:  -Group instruction provided by verbal instruction, video, and written materials to support subject matter.  Instructors review role of stress in heart disease and how to cope with stress positively.     Exercising on Your Own:  -Group instruction provided by verbal instruction, power point, and written materials to support subject.  Instructors discuss benefits of exercise, components of exercise, frequency and intensity of exercise, and end points for exercise.  Also discuss use of nitroglycerin and activating EMS.  Review options of places to exercise outside of rehab.  Review guidelines for sex with heart disease.   Cardiac Drugs I:  -Group instruction provided by verbal instruction and written materials to support subject.  Instructor  reviews cardiac drug classes: antiplatelets, anticoagulants, beta blockers, and statins.  Instructor discusses reasons, side effects, and lifestyle considerations for each drug class.   Cardiac Drugs II:  -Group instruction provided by verbal instruction and written materials to support subject.  Instructor reviews cardiac drug classes: angiotensin converting enzyme inhibitors (ACE-I), angiotensin II receptor blockers (ARBs), nitrates, and calcium channel blockers.  Instructor discusses reasons, side effects, and lifestyle considerations for each drug class.   Anatomy and Physiology of the Circulatory System:  Group verbal and written instruction and models provide basic cardiac anatomy and physiology, with the coronary electrical and arterial systems. Review of: AMI, Angina, Valve disease, Heart Failure, Peripheral Artery Disease, Cardiac Arrhythmia, Pacemakers, and the ICD.   Other Education:  -Group or individual verbal, written, or video instructions that support the educational goals of the cardiac rehab program.   Holiday Eating Survival Tips:  -Group instruction provided by PowerPoint slides, verbal discussion, and written materials to support subject matter. The instructor gives patients tips, tricks, and techniques to help them not only survive but enjoy the holidays despite the onslaught of food that accompanies the holidays.   Knowledge Questionnaire Score: Knowledge Questionnaire Score - 10/17/18 0942      Knowledge Questionnaire Score   Pre Score  21/24       Core Components/Risk Factors/Patient Goals at Admission: Personal Goals and Risk  Factors at Admission - 10/17/18 0943      Core Components/Risk Factors/Patient Goals on Admission    Weight Management  Yes;Weight Loss;Weight Maintenance    Intervention  Weight Management: Develop a combined nutrition and exercise program designed to reach desired caloric intake, while maintaining appropriate intake of nutrient and  fiber, sodium and fats, and appropriate energy expenditure required for the weight goal.;Weight Management: Provide education and appropriate resources to help participant work on and attain dietary goals.;Weight Management/Obesity: Establish reasonable short term and long term weight goals.    Admit Weight  191 lb 5.8 oz (86.8 kg)    Expected Outcomes  Short Term: Continue to assess and modify interventions until short term weight is achieved;Long Term: Adherence to nutrition and physical activity/exercise program aimed toward attainment of established weight goal;Weight Maintenance: Understanding of the daily nutrition guidelines, which includes 25-35% calories from fat, 7% or less cal from saturated fats, less than 249m cholesterol, less than 1.5gm of sodium, & 5 or more servings of fruits and vegetables daily;Weight Loss: Understanding of general recommendations for a balanced deficit meal plan, which promotes 1-2 lb weight loss per week and includes a negative energy balance of [phone] kcal/d;Understanding recommendations for meals to include 15-35% energy as protein, 25-35% energy from fat, 35-60% energy from carbohydrates, less than 2020mof dietary cholesterol, 20-35 gm of total fiber daily;Understanding of distribution of calorie intake throughout the day with the consumption of 4-5 meals/snacks    Hypertension  Yes    Intervention  Provide education on lifestyle modifcations including regular physical activity/exercise, weight management, moderate sodium restriction and increased consumption of fresh fruit, vegetables, and low fat dairy, alcohol moderation, and smoking cessation.;Monitor prescription use compliance.    Expected Outcomes  Short Term: Continued assessment and intervention until BP is < 140/906mG in hypertensive participants. < 130/106m92m in hypertensive participants with diabetes, heart failure or chronic kidney disease.;Long Term: Maintenance of blood pressure at goal levels.     Lipids  Yes    Intervention  Provide education and support for participant on nutrition & aerobic/resistive exercise along with prescribed medications to achieve LDL <70mg37mL >40mg.67mExpected Outcomes  Short Term: Participant states understanding of desired cholesterol values and is compliant with medications prescribed. Participant is following exercise prescription and nutrition guidelines.;Long Term: Cholesterol controlled with medications as prescribed, with individualized exercise RX and with personalized nutrition plan. Value goals: LDL < 70mg, 69m> 40 mg.    Personal Goal Other  Yes    Personal Goal  Pt wants to get back to his usual gym routine of interval training and using the elliptical.        Core Components/Risk Factors/Patient Goals Review:  Goals and Risk Factor Review    Row Name 10/23/18 1228 11/16/18 1517 12/07/18 1601 01/03/19 1115       Core Components/Risk Factors/Patient Goals Review   Personal Goals Review  Weight Management/Obesity;Lipids;Hypertension;Other  Weight Management/Obesity;Lipids;Hypertension;Other  Weight Management/Obesity;Lipids;Hypertension;Other  Weight Management/Obesity;Lipids;Hypertension;Other    Review  Pt willing to participate in CR exercise.  Lillian would like to get back to his routine that he has been doing at the gym.  Pt willing to participate in CR exercise.  Haniel is tolerating exercise well.  He feels that he is increasing his strength.   Pt willing to participate in CR exercise.  Keidrick is tolerating exercise well with increased workloads,   Pt willing to participate in CR exercise.  Leander continues to tolerate exercise well.  He has had some conerns regarding his medications.  These were communicated with Jlon's cardiologist office.     Expected Outcomes  Pt will continue to participate in CR exercise, nutrition, and lifestyle modification opportunities.   Pt will continue to participate in CR exercise, nutrition, and lifestyle modification  opportunities.   Pt will continue to participate in CR exercise, nutrition, and lifestyle modification opportunities.   Pt will continue to participate in CR exercise, nutrition, and lifestyle modification opportunities.        Core Components/Risk Factors/Patient Goals at Discharge (Final Review):  Goals and Risk Factor Review - 01/03/19 1115      Core Components/Risk Factors/Patient Goals Review   Personal Goals Review  Weight Management/Obesity;Lipids;Hypertension;Other    Review  Pt willing to participate in CR exercise.  Michio continues to tolerate exercise well.  He has had some conerns regarding his medications.  These were communicated with Heinz's cardiologist office.     Expected Outcomes  Pt will continue to participate in CR exercise, nutrition, and lifestyle modification opportunities.        ITP Comments: ITP Comments    Row Name 10/17/18 0810 10/23/18 1213 11/16/18 1224 12/07/18 1558 01/03/19 1112   ITP Comments  Dr. Fransico Him, Medical Director   30 Day ITP Review. Pt started exercise today and tolerared it well.   30 Day ITP Review. Tymon continues to do well with exercise.  He feels he is increasing his strength.   30 Day ITP Review.  Khalif continues to tolerate exercise well.  He is tolerating increased workloads.   30 Day ITP Review. Kimon continues to tolerate exercise well.  He has had some conerns about his cholesterol medications.  This was communicated to the cardiology office, adjustments were made, and he has an appointment to discuss any further concerns.       Comments: See ITP Comments.

## 2019-01-04 NOTE — Telephone Encounter (Signed)
New Message   Patient was seen today and calling back, because he thinks he received a call today after the appt and wants to know if he was needed for anything.

## 2019-01-05 ENCOUNTER — Encounter (HOSPITAL_COMMUNITY)
Admission: RE | Admit: 2019-01-05 | Discharge: 2019-01-05 | Disposition: A | Discharge status: Home / Self Care | Payer: Medicare Other | Source: Ambulatory Visit | Attending: Cardiovascular Disease | Admitting: Cardiovascular Disease

## 2019-01-05 ENCOUNTER — Encounter (HOSPITAL_COMMUNITY): Payer: Medicare Other

## 2019-01-05 DIAGNOSIS — E785 Hyperlipidemia, unspecified: Secondary | ICD-10-CM | POA: Diagnosis not present

## 2019-01-05 DIAGNOSIS — Z955 Presence of coronary angioplasty implant and graft: Secondary | ICD-10-CM

## 2019-01-05 DIAGNOSIS — Z79899 Other long term (current) drug therapy: Secondary | ICD-10-CM | POA: Diagnosis not present

## 2019-01-08 ENCOUNTER — Telehealth: Payer: Self-pay | Admitting: Cardiology

## 2019-01-08 ENCOUNTER — Encounter (HOSPITAL_COMMUNITY): Payer: Medicare Other

## 2019-01-08 ENCOUNTER — Encounter (HOSPITAL_COMMUNITY)
Admission: RE | Admit: 2019-01-08 | Discharge: 2019-01-08 | Disposition: A | Discharge status: Home / Self Care | Payer: Medicare Other | Source: Ambulatory Visit | Attending: Cardiovascular Disease | Admitting: Cardiovascular Disease

## 2019-01-08 DIAGNOSIS — Z79899 Other long term (current) drug therapy: Secondary | ICD-10-CM | POA: Diagnosis not present

## 2019-01-08 DIAGNOSIS — Z955 Presence of coronary angioplasty implant and graft: Secondary | ICD-10-CM

## 2019-01-08 DIAGNOSIS — E785 Hyperlipidemia, unspecified: Secondary | ICD-10-CM | POA: Diagnosis not present

## 2019-01-08 MED ORDER — TICAGRELOR 90 MG PO TABS: 90.0000 mg | ORAL_TABLET | Freq: Two times a day (BID) | ORAL | 11 refills | Status: DC

## 2019-01-08 NOTE — Telephone Encounter (Signed)
Instructed patient to STOP PLAVIX and START BRILINTA 90 mg BID. He understands to continue ASA.  Confirmed appointment 1/24.  He was grateful for assistance.

## 2019-01-08 NOTE — Telephone Encounter (Signed)
See MyChart message. Plavix was stopped and Brilinta 90 mg BID was prescribed.

## 2019-01-08 NOTE — Telephone Encounter (Signed)
New Message        Patient is calling to see if Pecolia Ades got his e-mail if not patient is asking for her to give him a call today please [phone].

## 2019-01-10 ENCOUNTER — Encounter (HOSPITAL_COMMUNITY): Payer: Medicare Other

## 2019-01-12 ENCOUNTER — Encounter (HOSPITAL_COMMUNITY): Payer: Medicare Other

## 2019-01-12 ENCOUNTER — Encounter (HOSPITAL_COMMUNITY)
Admission: RE | Admit: 2019-01-12 | Discharge: 2019-01-12 | Disposition: A | Discharge status: Home / Self Care | Payer: Medicare Other | Source: Ambulatory Visit | Attending: Cardiovascular Disease | Admitting: Cardiovascular Disease

## 2019-01-12 VITALS — Ht 69.25 in | Wt 180.1 lb

## 2019-01-12 DIAGNOSIS — Z955 Presence of coronary angioplasty implant and graft: Secondary | ICD-10-CM | POA: Diagnosis not present

## 2019-01-12 DIAGNOSIS — E785 Hyperlipidemia, unspecified: Secondary | ICD-10-CM | POA: Diagnosis not present

## 2019-01-12 DIAGNOSIS — Z79899 Other long term (current) drug therapy: Secondary | ICD-10-CM | POA: Diagnosis not present

## 2019-01-15 ENCOUNTER — Encounter (HOSPITAL_COMMUNITY)
Admission: RE | Admit: 2019-01-15 | Discharge: 2019-01-15 | Disposition: A | Discharge status: Home / Self Care | Payer: Medicare Other | Source: Ambulatory Visit | Attending: Cardiovascular Disease | Admitting: Cardiovascular Disease

## 2019-01-15 ENCOUNTER — Encounter (HOSPITAL_COMMUNITY): Payer: Medicare Other

## 2019-01-15 DIAGNOSIS — E785 Hyperlipidemia, unspecified: Secondary | ICD-10-CM | POA: Diagnosis not present

## 2019-01-15 DIAGNOSIS — Z955 Presence of coronary angioplasty implant and graft: Secondary | ICD-10-CM | POA: Diagnosis not present

## 2019-01-15 DIAGNOSIS — Z79899 Other long term (current) drug therapy: Secondary | ICD-10-CM | POA: Diagnosis not present

## 2019-01-17 ENCOUNTER — Encounter (HOSPITAL_COMMUNITY)
Admission: RE | Admit: 2019-01-17 | Discharge: 2019-01-17 | Disposition: A | Discharge status: Home / Self Care | Payer: Medicare Other | Source: Ambulatory Visit | Attending: Cardiovascular Disease | Admitting: Cardiovascular Disease

## 2019-01-17 ENCOUNTER — Encounter (HOSPITAL_COMMUNITY): Payer: Medicare Other

## 2019-01-17 DIAGNOSIS — H903 Sensorineural hearing loss, bilateral: Secondary | ICD-10-CM | POA: Diagnosis not present

## 2019-01-17 DIAGNOSIS — H9313 Tinnitus, bilateral: Secondary | ICD-10-CM | POA: Diagnosis not present

## 2019-01-17 DIAGNOSIS — Z955 Presence of coronary angioplasty implant and graft: Secondary | ICD-10-CM | POA: Diagnosis not present

## 2019-01-17 DIAGNOSIS — Z79899 Other long term (current) drug therapy: Secondary | ICD-10-CM | POA: Diagnosis not present

## 2019-01-17 DIAGNOSIS — E785 Hyperlipidemia, unspecified: Secondary | ICD-10-CM | POA: Diagnosis not present

## 2019-01-19 ENCOUNTER — Ambulatory Visit (INDEPENDENT_AMBULATORY_CARE_PROVIDER_SITE_OTHER): Payer: Medicare Other | Admitting: Physician Assistant

## 2019-01-19 ENCOUNTER — Encounter (HOSPITAL_COMMUNITY): Payer: Medicare Other

## 2019-01-19 ENCOUNTER — Encounter: Payer: Self-pay | Admitting: Physician Assistant

## 2019-01-19 VITALS — BP 102/78 | HR 80 | Ht 69.0 in | Wt 182.0 lb

## 2019-01-19 DIAGNOSIS — E785 Hyperlipidemia, unspecified: Secondary | ICD-10-CM | POA: Diagnosis not present

## 2019-01-19 DIAGNOSIS — I2510 Atherosclerotic heart disease of native coronary artery without angina pectoris: Secondary | ICD-10-CM | POA: Diagnosis not present

## 2019-01-19 MED ORDER — ROSUVASTATIN CALCIUM 5 MG PO TABS: 5.0000 mg | ORAL_TABLET | ORAL | 3 refills | Status: DC

## 2019-01-19 NOTE — Progress Notes (Signed)
Cardiology Office Note:    Date:  01/19/2019   ID:  Ronnie Vazquez, DOB [birth year], MRN [id number]  PCP:  Ronnie Kern, DO  Cardiologist:  Ronnie Mocha, MD   Electrophysiologist:  None   Referring MD: Ronnie Kern, DO   Chief Complaint  Patient presents with  . Follow-up    CAD    History of Present Illness:    Ronnie Vazquez is a 73 y.o. male with coronary artery disease status post PCI to the LAD in September 2019.  He underwent cardiac catheterization in August 2019 which demonstrated complex bifurcational disease of the proximal LAD/first diagonal with associated CCS class II angina.  Coronary artery bypass grafting was initially recommended.  However, the patient preferred proceeding with percutaneous coronary intervention.    Therefore, he underwent successful, complex bifurcational PCI with DES to the proximal LAD and angioplasty of the first diagonal through the LAD stent struts on 08/29/2018.    He is intolerant of statins and has been placed on Repatha with good results.  He was last seen by Dr. Burt Knack in December 2019.  Since that time, he has changed to Praluent due to side effects.  He was seen by Ronnie Perch, NP 01/04/2019 due to possible side effects from Clopidogrel.  Since that time he has been changed to Ticagrelor.    Ronnie Vazquez returns for follow up.  He has seen ENT for his tinnitus.  He has had marginal improvement since stopping Plavix.  He is going to an audiologist today to be fitted for hearing aids. He is concerned about the cost of Brilinta. He is not sure if he will be able to take it long term.  He thinks that, if his tinnitus does not improve, he would like to change back to Plavix.  He also wants to try switching to Crestor b/c the Praluent is too expensive.  He is also concerned he will have side effects to it.  He has not had chest pain, syncope, dyspnea on exertion, paroxysmal nocturnal dyspnea, edema.  He may be a little shortness of breath since starting  Brilinta.    Prior CV studies:   The following studies were reviewed today:  Percutaneous coronary intervention 08/29/2018 Successful bifurcation PCI with stenting of the proximal LAD using a 3.0x30 mm Resolute Onyx DES and angioplasty of the first diagonal (through the LAD stent struts) with a 3.0 mm balloon         Pre CABG Dopplers 08/10/18 Final Interpretation: Right Carotid: Velocities in the right ICA are consistent with a 1-39% stenosis. Left Carotid: Velocities in the left ICA are consistent with a 1-39% stenosis. Right ABI: Pedal artery waveform within normal limits. Left ABI: Pedal artery waveform within normal limits.  Cardiac Catheterization 08/08/18  Prox LAD-1 lesion is 90% stenosed.  Prox LAD-2 lesion is 70% stenosed.  The left ventricular systolic function is normal.  LV end diastolic pressure is normal.  The left ventricular ejection fraction is 55-65% by visual estimate.  Nuclear stress test 08/04/18  The left ventricular ejection fraction is mildly decreased (45-54%).  Nuclear stress EF: 54%.  ST segment depression was noted during stress in the V5, V6, II, III, aVF and I leads.  Blood pressure demonstrated a hypotensive response to exercise.  Defect 1: There is a large defect of severe severity present in the mid anterior, mid anteroseptal, apical anterior, apical septal and apex location.  Findings consistent with ischemia.  This is a high risk study.  Echo 08/13/2013 Moderate LVH, EF 65  Past Medical History:  Diagnosis Date  . Chicken pox    hx  . Coronary artery disease    08/29/18 PCI/DES with bifurcation from LAD into 1st diag.   Marland Kitchen Hyperlipidemia   . Left shoulder pain 02/02/2013  . Microscopic hematuria 02/02/2013  . Vision loss of left eye    Surgical Hx: The patient  has a past surgical history that includes Tonsillectomy and adenoidectomy (1959); LEFT HEART CATH AND CORONARY ANGIOGRAPHY (N/A, 08/08/2018); Colonoscopy; CORONARY STENT  INTERVENTION (N/A, 08/29/2018); and CORONARY BALLOON ANGIOPLASTY (N/A, 08/29/2018).   Current Medications: Current Meds  Medication Sig  . acetaminophen (TYLENOL) 325 MG tablet Take 650 mg by mouth at bedtime as needed (for sleep).   Marland Kitchen aspirin 81 MG tablet Take 81 mg by mouth daily.  . cetirizine (ZYRTEC) 10 MG tablet Take 10 mg by mouth daily as needed for allergies.  . nitroGLYCERIN (NITROSTAT) 0.4 MG SL tablet Place 1 tablet (0.4 mg total) under the tongue every 5 (five) minutes as needed for chest pain.  . pantoprazole (PROTONIX) 40 MG tablet Take 1 tablet (40 mg total) by mouth daily.  . ticagrelor (BRILINTA) 90 MG TABS tablet Take 1 tablet (90 mg total) by mouth 2 (two) times daily.  . [DISCONTINUED] Alirocumab (PRALUENT) 75 MG/ML SOAJ Inject 1 pen into the skin every 14 (fourteen) days.     Allergies:   Repatha [evolocumab]   Social History   Tobacco Use  . Smoking status: Never Smoker  . Smokeless tobacco: Never Used  Substance Use Topics  . Alcohol use: Yes    Alcohol/week: 2.0 standard drinks    Types: 2 Glasses of wine per week  . Drug use: No     Family Hx: The patient's family history includes Prostate cancer in his father. There is no history of Colon cancer, Esophageal cancer, Pancreatic cancer, Rectal cancer, Stomach cancer, or Heart disease.  ROS:   Please see the history of present illness.    ROS All other systems reviewed and are negative.   EKGs/Labs/Other Test Reviewed:    EKG:  EKG is not ordered today.    Recent Labs: 09/29/2018: ALT 21 11/28/2018: BUN 21; Creatinine, Ser 1.36; Hemoglobin 15.2; Platelets 225.0; Potassium 4.8; Sodium 140   Recent Lipid Panel Lab Results  Component Value Date/Time   CHOL 113 11/28/2018 08:36 AM   CHOL 145 09/29/2018 12:00 AM   TRIG 161.0 (H) 11/28/2018 08:36 AM   HDL 34.90 (L) 11/28/2018 08:36 AM   HDL 39 (L) 09/29/2018 12:00 AM   CHOLHDL 3 11/28/2018 08:36 AM   LDLCALC 45 11/28/2018 08:36 AM   LDLCALC 75  09/29/2018 12:00 AM   LDLDIRECT 117.0 08/12/2017 09:21 AM    Physical Exam:    VS:  BP 102/78   Pulse 80   Ht 5\' 9"  (1.753 m)   Wt 182 lb (82.6 kg)   SpO2 98%   BMI 26.88 kg/m     Wt Readings from Last 3 Encounters:  01/19/19 182 lb (82.6 kg)  01/15/19 180 lb 1.9 oz (81.7 kg)  01/04/19 183 lb 12.8 oz (83.4 kg)     Physical Exam  Constitutional: He is oriented to person, place, and time. He appears well-developed and well-nourished. No distress.  HENT:  Head: Normocephalic and atraumatic.  Neck: Neck supple. No JVD present.  Cardiovascular: Normal rate, regular rhythm, S1 normal and S2 normal.  No murmur heard. Pulmonary/Chest: Breath sounds normal. He has no rales.  Abdominal: Soft. There is no hepatomegaly.  Musculoskeletal:        General: No edema.  Neurological: He is alert and oriented to person, place, and time.  Skin: Skin is warm and dry.    ASSESSMENT & PLAN:    Coronary artery disease involving native coronary artery of native heart without angina pectoris S/p complex LAD/Dx PCI with DES to LAD and angioplasty to the Dx in 08/2018.  He is not having any anginal symptoms.  He continues to have tinnitus.  It is not clear if the Plavix was the culprit.  ASA can cause tinnitus but this seems to be assoc with toxicity and he has been on ASA for years.  He is having trouble affording Brilinta.  I have given him Rx cards to try to use today.  We will also look into getting a tier exception for him.  If he cannot remain on Brilinta due to cost, he knows to call us before he switches back to Plavix.  We discussed the need for a loading dose when switching.  Follow up with Dr. Burt Knack as planned.   Hyperlipidemia, unspecified hyperlipidemia type He would like to try low dose Crestor due to the cost of Praluent.  He can DC Praluent.  Start Crestor 5 mg every M, W, F.  Check Lipids and LFTs in 6-8 weeks.  If LDL > 70, start Zetia 10 mg QD.  If he cannot tolerate any of these  therapies, we could try him on Bempedoic acid once it is approved.    Dispo:  Return in about 8 months (around 09/20/2019) for Scheduled Follow Up, w/ Dr. Burt Knack.   Medication Adjustments/Labs and Tests Ordered: Current medicines are reviewed at length with the patient today.  Concerns regarding medicines are outlined above.  Tests Ordered: Orders Placed This Encounter  Procedures  . Lipid panel  . Hepatic function panel   Medication Changes: Meds ordered this encounter  Medications  . rosuvastatin (CRESTOR) 5 MG tablet    Sig: Take 1 tablet (5 mg total) by mouth 3 (three) times a week. Every Monday, Wednesday, Friday    Dispense:  36 tablet    Refill:  3    Signed, Richardson Dopp, PA-C  01/19/2019 1:19 PM    [facility] [address], [place], [place]  [address] Phone: [phone]; Fax: [phone]

## 2019-01-19 NOTE — Patient Instructions (Addendum)
Medication Instructions:  Your physician has recommended you make the following change in your medication:  1. STOP PRALUENT  2. START CRESTOR 5 MG EVERY Monday, Wednesday, Friday.  RX HAS BEEN SENT TO YOUR PHARMACY.   If you need a refill on your cardiac medications before your next appointment, please call your pharmacy.   Lab work: TO BE DONE IN 6-8 WEEKS: LFTS. LIPIDS   If you have labs (blood work) drawn today and your tests are completely normal, you will receive your results only by: Marland Kitchen MyChart Message (if you have MyChart) OR . A paper copy in the mail If you have any lab test that is abnormal or we need to change your treatment, we will call you to review the results.  Testing/Procedures: NONE  Follow-Up: At [facility], you and your health needs are our priority.  As part of our continuing mission to provide you with exceptional heart care, we have created designated Provider Care Teams.  These Care Teams include your primary Cardiologist (physician) and Advanced Practice Providers (APPs -  Physician Assistants and Nurse Practitioners) who all work together to provide you with the care you need, when you need it. . You will need a follow up appointment AS PLANNED IN THE FALL.  Please call our office 2 months in advance to schedule this appointment.  You may see Sherren Mocha, MDor one of the following Advanced Practice Providers on your designated Care Team: . Richardson Dopp, PA-C . Vin Bhagat, PA-C . Daune Perch, NP  Any Other Special Instructions Will Be Listed Below (If Applicable).

## 2019-01-22 ENCOUNTER — Telehealth: Payer: Self-pay

## 2019-01-22 ENCOUNTER — Encounter (HOSPITAL_COMMUNITY)
Admission: RE | Admit: 2019-01-22 | Discharge: 2019-01-22 | Disposition: A | Discharge status: Home / Self Care | Payer: Medicare Other | Source: Ambulatory Visit | Attending: Cardiovascular Disease | Admitting: Cardiovascular Disease

## 2019-01-22 ENCOUNTER — Encounter (HOSPITAL_COMMUNITY): Payer: Medicare Other

## 2019-01-22 DIAGNOSIS — Z79899 Other long term (current) drug therapy: Secondary | ICD-10-CM | POA: Diagnosis not present

## 2019-01-22 DIAGNOSIS — Z955 Presence of coronary angioplasty implant and graft: Secondary | ICD-10-CM | POA: Diagnosis not present

## 2019-01-22 DIAGNOSIS — E785 Hyperlipidemia, unspecified: Secondary | ICD-10-CM | POA: Diagnosis not present

## 2019-01-22 NOTE — Telephone Encounter (Signed)
-----   Message from Liliane Shi, [place] sent at 01/19/2019  1:18 PM EST ----- Regarding: Brilinta help Hi Lyn This is the patient that needs help with the cost of Brilinta.  Can you look into assistance for him? Thanks [employer]

## 2019-01-22 NOTE — Telephone Encounter (Signed)
Thank you :)

## 2019-01-22 NOTE — Telephone Encounter (Signed)
**Note De-Identified Ronnie Vazquez Obfuscation** I called the pts pharmacy to get the pts insurance information. I was advised that it looks like there is a deductible that the pt has not met for this year and that the cost to the pt will be $395 for a 30 day supply until the deductible is met.  I called the pt who advised me that the cards that Agency weaver, PA-c gave him at his last [place] worked and that he was advised by his pharmacist that a 30 day supply will cost him $45 using that card.  He is advised that if he has problems with the cost in the future to call me.  He verbalized understanding and thanked me for calling to check on him.

## 2019-01-24 ENCOUNTER — Encounter (HOSPITAL_COMMUNITY): Payer: Medicare Other

## 2019-02-02 NOTE — Progress Notes (Signed)
Discharge Progress Report  Patient Details  Name: Ronnie Vazquez MRN: [id number] Date of Birth: June 11, 1946 Referring Provider:     [facility] from 10/17/2018 in [place]  Referring Provider  Ronnie Mocha, MD       Number of Visits: 28  Reason for Discharge:  Patient reached a stable level of exercise. Patient independent in their exercise. Patient has met program and personal goals.  Smoking History:  Social History   Tobacco Use  Smoking Status Never Smoker  Smokeless Tobacco Never Used    Diagnosis:  08/29/2018 Stented coronary artery  ADL UCSD:   Initial Exercise Prescription: Initial Exercise Prescription - 10/17/18 0900      Date of Initial Exercise RX and Referring Provider   Date  10/17/18    Referring Provider  Ronnie Mocha, MD    Expected Discharge Date  01/26/19      Bike   Level  1.2    Minutes  10    METs  3.6      NuStep   Level  3    SPM  85    Minutes  10    METs  3.6      Track   Laps  15    Minutes  10    METs  3.6      Prescription Details   Frequency (times per week)  3    Duration  Progress to 30 minutes of continuous aerobic without signs/symptoms of physical distress      Intensity   THRR 40-80% of Max Heartrate  60-121    Ratings of Perceived Exertion  11-13      Progression   Progression  Continue to progress workloads to maintain intensity without signs/symptoms of physical distress.      Resistance Training   Training Prescription  Yes    Weight  3 lbs    Reps  10-15       Discharge Exercise Prescription (Final Exercise Prescription Changes): Exercise Prescription Changes - 01/22/19 1207      Response to Exercise   Blood Pressure (Admit)  100/70    Blood Pressure (Exercise)  108/60    Blood Pressure (Exit)  110/64    Heart Rate (Admit)  73 bpm    Heart Rate (Exercise)  105 bpm    Heart Rate (Exit)  74 bpm    Rating of Perceived Exertion  (Exercise)  12    Perceived Dyspnea (Exercise)  0    Symptoms  None    Comments  Pt graduated from Cardiac Rehab     Duration  Continue with 30 min of aerobic exercise without signs/symptoms of physical distress.    Intensity  THRR unchanged      Progression   Progression  Continue to progress workloads to maintain intensity without signs/symptoms of physical distress.    Average METs  4.81      Resistance Training   Training Prescription  Yes    Weight  6lbs    Reps  10-15    Time  10 Minutes      Interval Training   Interval Training  No      Treadmill   MPH  3.7    Grade  2    Minutes  10    METs  4.41      Bike   Level  1.5    Minutes  10    METs  4.47  NuStep   Level  6    SPM  105    Minutes  10    METs  5.1      Home Exercise Plan   Plans to continue exercise at  [employer] (comment)   [employer]   Frequency  Add 3 additional days to program exercise sessions.    Initial Home Exercises Provided  11/08/18       Functional Capacity: 6 Minute Walk    Row Name 10/17/18 0906 10/18/18 1104 01/15/19 0810     6 Minute Walk   Phase  Initial  -  Discharge   Distance  1826 feet  1826 feet  2320 feet   Distance % Change  -  -  10.79 %   Distance Feet Change  -  -  494 ft   Walk Time  6 minutes  6 minutes  6 minutes   # of Rest Breaks  0  -  0   MPH  3.46  -  4.39   METS  3.67  -  4.64   RPE  '13  13  11   ' Perceived Dyspnea   -  -  0   VO2 Peak  -  12.86  16.24   Symptoms  No  No  No   Resting HR  79 bpm  79 bpm  73 bpm   Resting BP  94/60  94/60  108/60   Max Ex. HR  102 bpm  102 bpm  87 bpm   Max Ex. BP  102/62  102/62  120/64   2 Minute Post BP  92/60  -  102/60      Psychological, QOL, Others - Outcomes: PHQ 2/9: Depression screen [facility] 2/9 02/02/2019 11/28/2018 11/28/2018 10/23/2018 08/12/2017  Decreased Interest 0 0 0 0 0  Down, Depressed, Hopeless 0 0 0 0 0  PHQ - 2 Score 0 0 0 0 0  Altered sleeping - 0 - - -  Tired, decreased  energy - 0 - - -  Change in appetite - 0 - - -  Feeling bad or failure about yourself  - 0 - - -  Trouble concentrating - 0 - - -  Moving slowly or fidgety/restless - 0 - - -  Suicidal thoughts - 0 - - -  PHQ-9 Score - 0 - - -    Quality of Life: Quality of Life - 01/25/19 1220      Quality of Life Scores   Health/Function Pre  23.5 %    Health/Function Post  26.8 %    Health/Function % Change  14.04 %    Socioeconomic Pre  26 %    Socioeconomic Post  28.93 %    Socioeconomic % Change   11.27 %    Psych/Spiritual Pre  24.6 %    Psych/Spiritual Post  27.86 %    Psych/Spiritual % Change  13.25 %    Family Pre  27.6 %    Family Post  28.8 %    Family % Change  4.35 %    GLOBAL Pre  24.9 %    GLOBAL Post  27.75 %    GLOBAL % Change  11.45 %       Personal Goals: Goals established at orientation with interventions provided to work toward goal. Personal Goals and Risk Factors at Admission - 10/17/18 0943      Core Components/Risk Factors/Patient Goals on Admission    Weight Management  Yes;Weight Loss;Weight  Maintenance    Intervention  Weight Management: Develop a combined nutrition and exercise program designed to reach desired caloric intake, while maintaining appropriate intake of nutrient and fiber, sodium and fats, and appropriate energy expenditure required for the weight goal.;Weight Management: Provide education and appropriate resources to help participant work on and attain dietary goals.;Weight Management/Obesity: Establish reasonable short term and long term weight goals.    Admit Weight  191 lb 5.8 oz (86.8 kg)    Expected Outcomes  Short Term: Continue to assess and modify interventions until short term weight is achieved;Long Term: Adherence to nutrition and physical activity/exercise program aimed toward attainment of established weight goal;Weight Maintenance: Understanding of the daily nutrition guidelines, which includes 25-35% calories from fat, 7% or less cal  from saturated fats, less than 222m cholesterol, less than 1.5gm of sodium, & 5 or more servings of fruits and vegetables daily;Weight Loss: Understanding of general recommendations for a balanced deficit meal plan, which promotes 1-2 lb weight loss per week and includes a negative energy balance of [phone] kcal/d;Understanding recommendations for meals to include 15-35% energy as protein, 25-35% energy from fat, 35-60% energy from carbohydrates, less than 2085mof dietary cholesterol, 20-35 gm of total fiber daily;Understanding of distribution of calorie intake throughout the day with the consumption of 4-5 meals/snacks    Hypertension  Yes    Intervention  Provide education on lifestyle modifcations including regular physical activity/exercise, weight management, moderate sodium restriction and increased consumption of fresh fruit, vegetables, and low fat dairy, alcohol moderation, and smoking cessation.;Monitor prescription use compliance.    Expected Outcomes  Short Term: Continued assessment and intervention until BP is < 140/9078mG in hypertensive participants. < 130/71m64m in hypertensive participants with diabetes, heart failure or chronic kidney disease.;Long Term: Maintenance of blood pressure at goal levels.    Lipids  Yes    Intervention  Provide education and support for participant on nutrition & aerobic/resistive exercise along with prescribed medications to achieve LDL <70mg24mL >40mg.46mExpected Outcomes  Short Term: Participant states understanding of desired cholesterol values and is compliant with medications prescribed. Participant is following exercise prescription and nutrition guidelines.;Long Term: Cholesterol controlled with medications as prescribed, with individualized exercise RX and with personalized nutrition plan. Value goals: LDL < 70mg, 35m> 40 mg.    Personal Goal Other  Yes    Personal Goal  Pt wants to get back to his usual gym routine of interval training and  using the elliptical.         Personal Goals Discharge: Goals and Risk Factor Review    Row Name 10/23/18 1228 11/16/18 1517 12/07/18 1601 01/03/19 1115 02/02/19 0904     Core Components/Risk Factors/Patient Goals Review   Personal Goals Review  Weight Management/Obesity;Lipids;Hypertension;Other  Weight Management/Obesity;Lipids;Hypertension;Other  Weight Management/Obesity;Lipids;Hypertension;Other  Weight Management/Obesity;Lipids;Hypertension;Other  Weight Management/Obesity;Lipids;Hypertension;Other   Review  Pt willing to participate in CR exercise.  Ronnie Vazquez would like to get back to his routine that he has been doing at the gym.  Pt willing to participate in CR exercise.  Ronnie Vazquez is tolerating exercise well.  He feels that he is increasing his strength.   Pt willing to participate in CR exercise.  Ronnie Vazquez is tolerating exercise well with increased workloads,   Pt willing to participate in CR exercise.  Ronnie Vazquez continues to tolerate exercise well.  He has had some conerns regarding his medications.  These were communicated with Ronnie Vazquez's cardiologist office.   [facility].  He tolerated exercise well with increased workloads.    Expected Outcomes  Pt will continue to participate in CR exercise, nutrition, and lifestyle modification opportunities.   Pt will continue to participate in CR exercise, nutrition, and lifestyle modification opportunities.   Pt will continue to participate in CR exercise, nutrition, and lifestyle modification opportunities.   Pt will continue to participate in CR exercise, nutrition, and lifestyle modification opportunities.   Pt will continue to participate in CR exercise, nutrition, and lifestyle modification opportunities.       Exercise Goals and Review: Exercise Goals    Row Name 10/17/18 1014             Exercise Goals   Increase Physical Activity  Yes       Intervention  Provide advice, education, support and counseling about  physical activity/exercise needs.;Develop an individualized exercise prescription for aerobic and resistive training based on initial evaluation findings, risk stratification, comorbidities and participant's personal goals.       Expected Outcomes  Short Term: Attend rehab on a regular basis to increase amount of physical activity.       Increase Strength and Stamina  Yes       Intervention  Provide advice, education, support and counseling about physical activity/exercise needs.;Develop an individualized exercise prescription for aerobic and resistive training based on initial evaluation findings, risk stratification, comorbidities and participant's personal goals.       Expected Outcomes  Short Term: Increase workloads from initial exercise prescription for resistance, speed, and METs.       Able to understand and use rate of perceived exertion (RPE) scale  Yes       Intervention  Provide education and explanation on how to use RPE scale       Expected Outcomes  Short Term: Able to use RPE daily in rehab to express subjective intensity level;Long Term:  Able to use RPE to guide intensity level when exercising independently       Knowledge and understanding of Target Heart Rate Range (THRR)  Yes       Intervention  Provide education and explanation of THRR including how the numbers were predicted and where they are located for reference       Expected Outcomes  Short Term: Able to state/look up THRR;Long Term: Able to use THRR to govern intensity when exercising independently;Short Term: Able to use daily as guideline for intensity in rehab       Able to check pulse independently  Yes       Intervention  Provide education and demonstration on how to check pulse in carotid and radial arteries.;Review the importance of being able to check your own pulse for safety during independent exercise       Expected Outcomes  Short Term: Able to explain why pulse checking is important during independent exercise        Understanding of Exercise Prescription  Yes       Intervention  Provide education, explanation, and written materials on patient's individual exercise prescription       Expected Outcomes  Short Term: Able to explain program exercise prescription;Long Term: Able to explain home exercise prescription to exercise independently          Exercise Goals Re-Evaluation: Exercise Goals Re-Evaluation    Row Name 11/08/18 1426 12/07/18 1416 01/01/19 1638 01/25/19 1211       Exercise Goal Re-Evaluation   Exercise Goals Review  Increase Physical Activity;Able to understand  and use rate of perceived exertion (RPE) scale;Knowledge and understanding of Target Heart Rate Range (THRR);Understanding of Exercise Prescription;Increase Strength and Stamina;Able to check pulse independently  Increase Physical Activity;Understanding of Exercise Prescription  Increase Physical Activity;Understanding of Exercise Prescription  Increase Physical Activity;Understanding of Exercise Prescription    Comments  Reviewed HEP with pt. Also reviewed THRR, RPE Scale, weather conditions, endpoints of exercise, NTG use, warmup and cool down.   Pt is able to exercise 30 minutes with no difficulty. Pt is tolerating treadmill well since adding to exercise prescription. Will work to increase pt to 6lbs handweights.   Spoke with pt regarding exercise at [employer]. Pt states he has not returned to intensity of exercise he was doing prior to heart event. Pt states he is not comfortable. Advised pt that rehab would be the setting to increase intensity to watch for possible symptoms. Will continue to work with pt to increase workloads and reach goals.   Pt completed 28 sessions of Cardiac Rehab. Pt increased fucntional capacity by 10.79%. Pt increased post 6MWT distance by 473f. Pt decreased weight by 11lbs. Pt was able to increase confidence with exercising on his own.     Expected Outcomes  [place], [place]-45 minutes. Pt will continue to increase cardiorespiratory and muscular fitness. Will continue to monitor.   Pt is continuing to visit [facility] a week on weekends. Will follow up with pt regarding strength training. One of pt's goals is to return to lifting weights.   Pt will continue to increase cardiovascular strength. [facility] continue to [facility] a [facility] [employer] [facility].   Pt will continue to increase cardiovascular strength. Pt will exercise at local [employer] 3-4 days week for 30-45 minutes.        Nutrition & Weight - Outcomes: Pre Biometrics - 10/17/18 0908      Pre Biometrics   Height  5' 9.25" (1.759 m)    Weight  86.8 kg    Waist Circumference  40.5 inches    Hip Circumference  40 inches    Waist to Hip Ratio  1.01 %    BMI (Calculated)  28.05    Triceps Skinfold  28 mm    % Body Fat  30.4 %    Grip Strength  37 kg    Flexibility  8 in    Single Leg Stand  7 seconds      Post Biometrics - 01/15/19 0811       Post  Biometrics   Height  5' 9.25" (1.759 m)    Weight  81.7 kg    Waist Circumference  38 inches    Hip Circumference  39 inches    Waist to Hip Ratio  0.97 %    BMI (Calculated)  26.41    Triceps Skinfold  20 mm    % Body Fat  27.2 %    Grip Strength  40 kg    Flexibility  13 in    Single Leg Stand  14.25 seconds       Nutrition: Nutrition Therapy & Goals - 10/17/18 0918      Nutrition Therapy   Diet  heart healthy      Personal Nutrition Goals   Nutrition Goal  Pt to identify and limit food sources of saturated fat, trans fat, refined carbohydrates and sodium    Personal Goal #2  pt  to identify food quantities necessary to achieve weight loss of 6-24 lbs at graduation from cardiac rehab. Goal weight of less than 180 lbs desired      Intervention Plan   Intervention  Prescribe, educate and counsel regarding individualized specific dietary modifications aiming towards targeted  core components such as weight, hypertension, lipid management, diabetes, heart failure and other comorbidities.    Expected Outcomes  Short Term Goal: Understand basic principles of dietary content, such as calories, fat, sodium, cholesterol and nutrients.;Long Term Goal: Adherence to prescribed nutrition plan.       Nutrition Discharge: Nutrition Assessments - 01/26/19 1720      MEDFICTS Scores   Pre Score  18    Post Score  9    Score Difference  -9       Education Questionnaire Score: Knowledge Questionnaire Score - 01/25/19 1215      Knowledge Questionnaire Score   Pre Score  21/24    Post Score  23/24       Goals reviewed with patient; copy given to patient.

## 2019-02-08 ENCOUNTER — Telehealth: Payer: Self-pay | Admitting: Pharmacist

## 2019-02-08 MED ORDER — ALIROCUMAB 75 MG/ML ~~LOC~~ SOAJ: 1.0000 "pen " | SUBCUTANEOUS | 11 refills | Status: DC

## 2019-02-08 MED ORDER — EZETIMIBE 10 MG PO TABS: 10.0000 mg | ORAL_TABLET | Freq: Every day | ORAL | 11 refills | Status: DC

## 2019-02-08 NOTE — Telephone Encounter (Signed)
Patient left voicemail stating he took 2 doses of rosuvastatin at the MWF dose and woke up with stabbing pain in his back that then spread. He does not want to take any more statins. Discussed Praluent with patient, but he is concerned that he will have the same reaction he had to Repatha to it. He tolerated the first 2 doses but states that it was on the third dose of Repatha that his muscle pains occurred. Pain stopped when he stopped the medication. Would like to try something else. Of note he also thinks that his Pattricia Boss is causing him issues (no energy, tremor) but will continue it for another few weeks until he is 6 months out from his stent.  Although we do not have any cardiovascular protection evidence with zetia monotherapy, will prescribe as currently there are no other options. Discussed Bempedoic acid- should be released to market in the next few weeks. Will add patient to the list of possible patients to add to therapy once release.

## 2019-02-20 ENCOUNTER — Telehealth: Payer: Self-pay | Admitting: Physician Assistant

## 2019-02-20 NOTE — Telephone Encounter (Signed)
New Message         Patient is calling in today because the "Cresto" is making his legs cramp up, pls call and advise.

## 2019-02-21 ENCOUNTER — Other Ambulatory Visit: Payer: Self-pay

## 2019-02-21 NOTE — Telephone Encounter (Signed)
Patient returned call stating he hasn't took Crestor in about 3-4 weeks. Pt said he woke up with bad stabbing pain in his back. Pt states that after about 6 days later pt said that he felt so much better after not taking the Crestor. Pt says that he does not want to take any more statins and wants to wait to see what his lab shows to see if he even needs to be on a start taking Zetia but he does have it at home. Pt says that if his lab work shows that he needs to take Zetia he will. Mr. Guerrini states that he has lost 20 lbs and eating better which is why he feels that he might not need to take Crestor anymore. Pt also states that he is not taking Praluent or Repatha due to it causing leg cramps in both legs.  Pt's biggest concern is he also thinks that his Pattricia Boss is causing ringing in his ears and other problems but would like to know if he could stop after 6 months which pt says is March the 4th. Pt wants to see if maybe he can switch to Plavix. See phone note from 2/13.

## 2019-02-21 NOTE — Telephone Encounter (Signed)
LMOM for pt to call our office back about Crestor.

## 2019-02-22 NOTE — Telephone Encounter (Signed)
Plavix was initially changed to Brilinta due to symptoms of ringing in his ears. I think it would be best to remain on Brilinta for now.  He will be 6 mos out from his PCI in March.  I will forward this note to Dr. Burt Knack to see if we can make additional adjustments once he is 6 months out.  It is best to aggressively treat cholesterol to reduce future risk of MI, progressive CAD.  Unfortunately, he is unable to tolerate any medical therapy tried so far.  I would prefer he try Zetia.  But, if he prefers to hold off, I recommend he have fasting lipids checked 6-8 weeks after stopping his Crestor.  Richardson Dopp, PA-C    02/22/2019 3:30 PM

## 2019-02-22 NOTE — Telephone Encounter (Signed)
Reviewed recommendations with pt who verbalized understanding. Pt states that he prefers not to start Zetia until after he has lab work done. I have reschedule lab appt 6-8 weeks (3/20) from the last time pt took Crestor per [employer], PA-C.  Pt has agree to stay on Brilinta and wait to see any additional recommendations that Dr. Burt Knack has. Pt thanked me for the call.

## 2019-02-22 NOTE — Telephone Encounter (Signed)
I think he can stop brilinta when he is 6 months out from PCI. 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 I would normally keep him on plavix or brilinta longer since he has proximal LAD stenting, but he has many side effects that he attributes to these drugs.

## 2019-02-23 NOTE — Addendum Note (Signed)
Addended by: Frederik Schmidt on: 02/23/2019 02:28 PM   Modules accepted: Orders

## 2019-02-23 NOTE — Telephone Encounter (Signed)
Continue Brilinta through 02/27/2019. He may stop Brilinta after 02/27/2019. Stay on ASA 81 mg QD indefinitely.   See notes from Dr. Burt Knack. Richardson Dopp, PA-C    02/23/2019 2:10 PM

## 2019-02-23 NOTE — Telephone Encounter (Signed)
Spoke to the patient and gave him Scott's recommendations.  He verbalized understanding.  He will stop Brilinta after 3/3 and continue Aspirin.

## 2019-03-02 ENCOUNTER — Other Ambulatory Visit: Payer: Medicare Other

## 2019-03-05 DIAGNOSIS — H903 Sensorineural hearing loss, bilateral: Secondary | ICD-10-CM | POA: Diagnosis not present

## 2019-03-05 DIAGNOSIS — H9313 Tinnitus, bilateral: Secondary | ICD-10-CM | POA: Diagnosis not present

## 2019-03-15 ENCOUNTER — Telehealth: Payer: Self-pay | Admitting: Internal Medicine

## 2019-03-15 NOTE — Telephone Encounter (Signed)
Called patient Pt not on statin   Says Dr Burt Knack knows Has changed diet Will postpone labs given current viral pandemic Will forward tp dr Burt Knack to decide safe time parameters given that he is not on anything    Pt understands   Agrees with plan

## 2019-03-16 ENCOUNTER — Other Ambulatory Visit: Payer: Medicare Other

## 2019-03-16 NOTE — Telephone Encounter (Signed)
Thanks. He wouldn't worry about his cholesterol right now. We can draw his labs in a few months. Best to minimize his exposure at present.

## 2019-03-29 NOTE — Progress Notes (Signed)
Virtual Visit via Video Note  I connected with@ on 03/30/19 at  8:00 AM EDT by a video enabled telemedicine application and verified that I am speaking with the correct person using two identifiers.  Location patient: home Location provider:work or home office Persons participating in the virtual visit: patient, provider  I discussed the limitations of evaluation and management by telemedicine and the availability of in person appointments. The patient expressed understanding and agreed to proceed.   [name] DOB: 1946/03/18 Encounter date: 03/30/2019  This isa 73 y.o. male who presents to establish care. Chief Complaint  Patient presents with  . Transitions Of Care    History of present illness: CAD:s/p PCI to LAD in Sept 2019. Intolerant of statins; has been on Praluent, ticagrelor.  He had muscle aches even with the injectable Repatha (by third shot he started with reaction). He has changed entire diet, has lost weight. Has been off of all cholesterol medications now for just over a month. Was supposed to have repeated on march 20th.  GERD: A few weeks after surgery he started to get chest pain while taking shower at 11pm in evening. Ended up getting some increased acid production from one of medications s/p surgery. Has been better since being off of the [place].  Hyperlipemia: at last cardiology visit was switched to crestor 5mg  MWF. By second pill of crestor he was on crutches; had to stop this. Took 5 days to go away.   Wearing hearing aids now; hasn't helped with ringing but didn't make it worse either. Just made things louder.   Energy level is good. Walked yesterday over an hour. Has lost some of muscle so would like to get back on track with lifting weights.   He is working on [employer]. Cheerios/raisin bran with fruit in the morning. Lunch usually chicken or just sweet potato. Occasional red meat at dinner but usually more chicken/fish, vegetables.   Blood pressure  tends to run low (see last readings); no dizziness/light headedness.    Past Medical History:  Diagnosis Date  . Chicken pox    hx  . Coronary artery disease    08/29/18 PCI/DES with bifurcation from LAD into 1st diag.   Marland Kitchen Hyperlipidemia   . Left shoulder pain 02/02/2013  . Microscopic hematuria 02/02/2013  . Vision loss of left eye    Past Surgical History:  Procedure Laterality Date  . COLONOSCOPY    . CORONARY BALLOON ANGIOPLASTY N/A 08/29/2018   Procedure: CORONARY BALLOON ANGIOPLASTY;  Surgeon: Sherren Mocha, MD;  Location: [facility];  Service: Cardiovascular;  Laterality: N/A;  . CORONARY STENT INTERVENTION N/A 08/29/2018   Procedure: CORONARY STENT INTERVENTION;  Surgeon: Sherren Mocha, MD;  Location: [facility];  Service: Cardiovascular;  Laterality: N/A;  . LEFT HEART CATH AND CORONARY ANGIOGRAPHY N/A 08/08/2018   Procedure: LEFT HEART CATH AND CORONARY ANGIOGRAPHY;  Surgeon: Lorretta Harp, MD;  Location: [facility];  Service: Cardiovascular;  Laterality: N/A;  . TONSILLECTOMY AND ADENOIDECTOMY  1959   Allergies  Allergen Reactions  . Repatha [Evolocumab] Other (See Comments)    Myalgias   Current Meds  Medication Sig  . acetaminophen (TYLENOL) 325 MG tablet Take 650 mg by mouth at bedtime as needed (for sleep).   Marland Kitchen aspirin 81 MG tablet Take 81 mg by mouth daily.  . cetirizine (ZYRTEC) 10 MG tablet Take 10 mg by mouth daily as needed for allergies.  . nitroGLYCERIN (NITROSTAT) 0.4 MG SL  tablet Place 1 tablet (0.4 mg total) under the tongue every 5 (five) minutes as needed for chest pain.  . [DISCONTINUED] ezetimibe (ZETIA) 10 MG tablet Take 1 tablet (10 mg total) by mouth daily.  . [DISCONTINUED] pantoprazole (PROTONIX) 40 MG tablet Take 1 tablet (40 mg total) by mouth daily.  . [DISCONTINUED] rosuvastatin (CRESTOR) 5 MG tablet Take 1 tablet (5 mg total) by mouth 3 (three) times a week. Every Monday, Wednesday, Friday   Social History   Tobacco  Use  . Smoking status: Never Smoker  . Smokeless tobacco: Never Used  Substance Use Topics  . Alcohol use: Yes    Alcohol/week: 2.0 standard drinks    Types: 2 Glasses of wine per week   Family History  Problem Relation Age of Onset  . Prostate cancer Father   . Arthritis Mother   . Arthritis/Rheumatoid Brother   . Colon cancer Neg Hx   . Esophageal cancer Neg Hx   . Pancreatic cancer Neg Hx   . Rectal cancer Neg Hx   . Stomach cancer Neg Hx   . Heart disease Neg Hx      Review of Systems  Constitutional: Negative for chills, fatigue and fever.  Respiratory: Negative for cough, chest tightness, shortness of breath and wheezing.   Cardiovascular: Negative for chest pain, palpitations and leg swelling.  Neurological: Negative for dizziness and light-headedness.    Objective:  There were no vitals taken for this visit.      BP Readings from Last 3 Encounters:  01/19/19 102/78  01/04/19 (!) 88/62  11/28/18 100/62   Wt Readings from Last 3 Encounters:  01/19/19 182 lb (82.6 kg)  01/15/19 180 lb 1.9 oz (81.7 kg)  01/04/19 183 lb 12.8 oz (83.4 kg)    EXAM:  GENERAL: alert, oriented, appears well and in no acute distress  HEENT: atraumatic, conjunctiva clear, no obvious abnormalities on inspection of external nose and ears  NECK: normal movements of the head and neck  LUNGS: on inspection no signs of respiratory distress, breathing rate appears normal, no obvious gross SOB, gasping or wheezing  CV: no obvious cyanosis  MS: moves all visible extremities without noticeable abnormality  PSYCH/NEURO: pleasant and cooperative, no obvious depression or anxiety, speech and thought processing grossly intact   Assessment/Plan 1. Bilateral carotid artery disease, unspecified type (HCC)/Coronary Artery Disease. Following with cardiology. Intolerant to statins. He is anxious to get repeat bloodwork since being off of medications x 1 month. He has changed diet significantly  and hoping to control cholesterol off medications. He has been intolerant to all statins tried as well as repatha and praluent.  2. Gastroesophageal reflux disease, esophagitis presence not specified Resolved; medication related.  3. Pure hypercholesterolemia See above - Lipid panel; Future - Comprehensive metabolic panel; Future - TSH; Future   Return labwork next week; follow up for AWV in summer.   Consider shingrix once corona fear is improved.     I discussed the assessment and treatment plan with the patient. The patient was provided an opportunity to ask questions and all were answered. The patient agreed with the plan and demonstrated an understanding of the instructions.   The patient was advised to call back or seek an in-person evaluation if the symptoms worsen or if the condition fails to improve as anticipated.    Micheline Rough, MD

## 2019-03-30 ENCOUNTER — Encounter: Payer: Self-pay | Admitting: Family Medicine

## 2019-03-30 ENCOUNTER — Other Ambulatory Visit: Payer: Self-pay

## 2019-03-30 ENCOUNTER — Ambulatory Visit (INDEPENDENT_AMBULATORY_CARE_PROVIDER_SITE_OTHER): Payer: Medicare Other | Admitting: Family Medicine

## 2019-03-30 DIAGNOSIS — K219 Gastro-esophageal reflux disease without esophagitis: Secondary | ICD-10-CM

## 2019-03-30 DIAGNOSIS — I25118 Atherosclerotic heart disease of native coronary artery with other forms of angina pectoris: Secondary | ICD-10-CM

## 2019-03-30 DIAGNOSIS — I739 Peripheral vascular disease, unspecified: Principal | ICD-10-CM

## 2019-03-30 DIAGNOSIS — E7800 Pure hypercholesterolemia, unspecified: Secondary | ICD-10-CM | POA: Diagnosis not present

## 2019-03-30 DIAGNOSIS — I779 Disorder of arteries and arterioles, unspecified: Secondary | ICD-10-CM

## 2019-04-02 ENCOUNTER — Other Ambulatory Visit: Payer: Self-pay

## 2019-04-02 ENCOUNTER — Other Ambulatory Visit (INDEPENDENT_AMBULATORY_CARE_PROVIDER_SITE_OTHER): Payer: Medicare Other

## 2019-04-02 DIAGNOSIS — E7800 Pure hypercholesterolemia, unspecified: Secondary | ICD-10-CM | POA: Diagnosis not present

## 2019-04-02 LAB — COMPREHENSIVE METABOLIC PANEL
ALT: 18 U/L (ref 0–53)
AST: 17 U/L (ref 0–37)
Albumin: 3.8 g/dL (ref 3.5–5.2)
Alkaline Phosphatase: 51 U/L (ref 39–117)
BUN: 24 mg/dL — ABNORMAL HIGH (ref 6–23)
CO2: 28 mEq/L (ref 19–32)
Calcium: 9.3 mg/dL (ref 8.4–10.5)
Chloride: 107 mEq/L (ref 96–112)
Creatinine, Ser: 1.2 mg/dL (ref 0.40–1.50)
GFR: 59.8 mL/min — ABNORMAL LOW (ref >60.00)
Glucose, Bld: 85 mg/dL (ref 70–99)
Potassium: 4.6 mEq/L (ref 3.5–5.1)
Sodium: 142 mEq/L (ref 135–145)
Total Bilirubin: 0.3 mg/dL (ref 0.2–1.2)
Total Protein: 6.3 g/dL (ref 6.0–8.3)

## 2019-04-02 LAB — LIPID PANEL
Cholesterol: 170 mg/dL (ref 0–200)
HDL: 34.4 mg/dL — ABNORMAL LOW (ref >39.00)
LDL Cholesterol: 111 mg/dL — ABNORMAL HIGH (ref 0–99)
NonHDL: 135.61
Total CHOL/HDL Ratio: 5
Triglycerides: 124 mg/dL (ref 0.0–149.0)
VLDL: 24.8 mg/dL (ref 0.0–40.0)

## 2019-04-02 LAB — TSH: TSH: 1.87 u[IU]/mL (ref 0.35–4.50)

## 2019-04-03 ENCOUNTER — Encounter: Payer: Self-pay | Admitting: Family Medicine

## 2019-04-06 ENCOUNTER — Encounter: Payer: Self-pay | Admitting: Family Medicine

## 2019-04-06 ENCOUNTER — Other Ambulatory Visit: Payer: Self-pay | Admitting: Family Medicine

## 2019-04-09 ENCOUNTER — Other Ambulatory Visit: Payer: Self-pay | Admitting: Family Medicine

## 2019-04-09 DIAGNOSIS — E785 Hyperlipidemia, unspecified: Secondary | ICD-10-CM

## 2019-04-30 ENCOUNTER — Ambulatory Visit: Payer: Medicare Other | Admitting: Family Medicine

## 2019-05-09 DIAGNOSIS — H5310 Unspecified subjective visual disturbances: Secondary | ICD-10-CM | POA: Diagnosis not present

## 2019-05-11 ENCOUNTER — Telehealth: Payer: Self-pay | Admitting: Pharmacist

## 2019-05-11 NOTE — Telephone Encounter (Signed)
Called pt and LMOM to discuss lipids. LDL has increased from 45 to 111 since stopping PCSK9i therapy. He is intolerant to multiple statins. He last spoke with Melissa in February and was encouraged to start Zetia, however it does not appear as though he did as he wanted to see how his cholesterol looked off of medication with lifestyle improvement.  Will await patient's return call and encourage him to start Zetia 10mg  daily. Can also discuss Nexletol 180mg  daily as well.

## 2019-05-14 NOTE — Telephone Encounter (Signed)
Pt returned call to clinic. He states he has a bottle of Zetia at home but never started taking it. He is agreeable to starting Zetia and reports his PCP will check his labs again in July. He will call if he develops any muscle aches on Zetia, could try Nexletol then.

## 2019-05-30 ENCOUNTER — Other Ambulatory Visit: Payer: Medicare Other

## 2019-06-15 ENCOUNTER — Other Ambulatory Visit (INDEPENDENT_AMBULATORY_CARE_PROVIDER_SITE_OTHER): Payer: Medicare Other

## 2019-06-15 ENCOUNTER — Other Ambulatory Visit: Payer: Self-pay

## 2019-06-15 DIAGNOSIS — E785 Hyperlipidemia, unspecified: Secondary | ICD-10-CM | POA: Diagnosis not present

## 2019-06-15 LAB — COMPREHENSIVE METABOLIC PANEL
ALT: 26 U/L (ref 0–53)
AST: 24 U/L (ref 0–37)
Albumin: 4.2 g/dL (ref 3.5–5.2)
Alkaline Phosphatase: 50 U/L (ref 39–117)
BUN: 25 mg/dL — ABNORMAL HIGH (ref 6–23)
CO2: 25 mEq/L (ref 19–32)
Calcium: 9.3 mg/dL (ref 8.4–10.5)
Chloride: 105 mEq/L (ref 96–112)
Creatinine, Ser: 1.19 mg/dL (ref 0.40–1.50)
GFR: 60.34 mL/min (ref >60.00)
Glucose, Bld: 82 mg/dL (ref 70–99)
Potassium: 4.3 mEq/L (ref 3.5–5.1)
Sodium: 140 mEq/L (ref 135–145)
Total Bilirubin: 0.5 mg/dL (ref 0.2–1.2)
Total Protein: 6.3 g/dL (ref 6.0–8.3)

## 2019-06-15 LAB — LIPID PANEL
Cholesterol: 173 mg/dL (ref 0–200)
HDL: 44.3 mg/dL (ref >39.00)
LDL Cholesterol: 99 mg/dL (ref 0–99)
NonHDL: 128.42
Total CHOL/HDL Ratio: 4
Triglycerides: 145 mg/dL (ref 0.0–149.0)
VLDL: 29 mg/dL (ref 0.0–40.0)

## 2019-06-27 ENCOUNTER — Telehealth: Payer: Self-pay | Admitting: Pharmacist

## 2019-06-27 DIAGNOSIS — E785 Hyperlipidemia, unspecified: Secondary | ICD-10-CM

## 2019-06-27 NOTE — Telephone Encounter (Signed)
Called pt and left message to discuss adding Nexletol to his medications - LDL improved to 99 on Zetia however still remains above goal < 70.

## 2019-07-03 NOTE — Telephone Encounter (Signed)
Spoke with pt who is agreeable - will submit prior authorization for combination Nexlizet medication (Nexletol and ezetimibe).  New [employer] Part D insurance as follows: ID: [id number] BIN: [id number] GRP: [id number] PCN: [id number]

## 2019-07-04 MED ORDER — NEXLIZET 180-10 MG PO TABS: 1.0000 | ORAL_TABLET | Freq: Every day | ORAL | 11 refills | Status: DC

## 2019-07-04 NOTE — Addendum Note (Signed)
Addended by: Ilanna Deihl E on: 07/04/2019 11:06 AM   Modules accepted: Orders

## 2019-07-04 NOTE — Telephone Encounter (Addendum)
Nexlizet prior authorization approved until further notice. Rx sent to pharmacy - copay $142.45 for 1 month supply. Called [employer] insurance to see if tier reduction could be completed to lower copay. Since Nexlizet is non formulary, it is automatically a Tier 4 medication once approved, and is not eligible for tier reduction. Nexletol is the same.  Called pt to see if he would qualify for the [employer] based on his income. He does and was approved for $2500 in copay assistance - their grant currently only covers Nexletol, not combination Nexlizet. New prior authorization submitted for Nexletol.  [employer] Grant info through 06/04/20: ID: [id number] PCN: [id number] GRP: [id number] BIN: [id number]

## 2019-07-06 NOTE — Addendum Note (Signed)
Addended by: SUPPLE, MEGAN E on: 07/06/2019 11:38 AM   Modules accepted: Orders

## 2019-07-09 MED ORDER — NEXLETOL 180 MG PO TABS: 1.0000 | ORAL_TABLET | Freq: Every day | ORAL | 11 refills | Status: DC

## 2019-07-09 NOTE — Telephone Encounter (Signed)
Nexletol prior authorization approved until further notice. Rx sent to pharmacy and grant foundation info provided to make copay $0. Will recheck lipids in 3 months.

## 2019-07-09 NOTE — Addendum Note (Signed)
Addended by: Colena Ketterman E on: 07/09/2019 09:38 AM   Modules accepted: Orders

## 2019-07-26 ENCOUNTER — Telehealth (HOSPITAL_COMMUNITY): Payer: Self-pay | Admitting: *Deleted

## 2019-07-26 NOTE — Telephone Encounter (Signed)
Left message on voicemail in reference to upcoming appointment scheduled for 07/30/19. Phone number given for a call back so details instructions can be given. Ronnie Vazquez

## 2019-07-30 ENCOUNTER — Ambulatory Visit (HOSPITAL_COMMUNITY): Payer: Medicare Other | Attending: Cardiology

## 2019-07-30 ENCOUNTER — Other Ambulatory Visit: Payer: Self-pay

## 2019-07-30 DIAGNOSIS — I2510 Atherosclerotic heart disease of native coronary artery without angina pectoris: Secondary | ICD-10-CM | POA: Diagnosis not present

## 2019-07-30 LAB — MYOCARDIAL PERFUSION IMAGING
LV dias vol: 83 mL (ref 62–150)
LV sys vol: 33 mL
Peak HR: 81 {beats}/min
Rest HR: 64 {beats}/min
SDS: 0
SRS: 0
SSS: 0
TID: 0.93

## 2019-07-30 MED ORDER — REGADENOSON 0.4 MG/5ML IV SOLN
0.4000 mg | Freq: Once | INTRAVENOUS | Status: AC
  Administered 2019-07-30: 0.4 mg via INTRAVENOUS

## 2019-07-30 MED ORDER — TECHNETIUM TC 99M TETROFOSMIN IV KIT
10.2000 | PACK | Freq: Once | INTRAVENOUS | Status: AC | PRN
  Administered 2019-07-30: 10.2 via INTRAVENOUS
  Filled 2019-07-30: qty 11

## 2019-07-30 MED ORDER — TECHNETIUM TC 99M TETROFOSMIN IV KIT
31.2000 | PACK | Freq: Once | INTRAVENOUS | Status: AC | PRN
  Administered 2019-07-30: 31.2 via INTRAVENOUS
  Filled 2019-07-30: qty 32

## 2019-07-31 DIAGNOSIS — L570 Actinic keratosis: Secondary | ICD-10-CM | POA: Diagnosis not present

## 2019-07-31 DIAGNOSIS — D225 Melanocytic nevi of trunk: Secondary | ICD-10-CM | POA: Diagnosis not present

## 2019-07-31 DIAGNOSIS — D485 Neoplasm of uncertain behavior of skin: Secondary | ICD-10-CM | POA: Diagnosis not present

## 2019-07-31 DIAGNOSIS — L814 Other melanin hyperpigmentation: Secondary | ICD-10-CM | POA: Diagnosis not present

## 2019-07-31 DIAGNOSIS — L821 Other seborrheic keratosis: Secondary | ICD-10-CM | POA: Diagnosis not present

## 2019-08-03 ENCOUNTER — Encounter: Payer: Self-pay | Admitting: Cardiology

## 2019-08-03 ENCOUNTER — Ambulatory Visit (INDEPENDENT_AMBULATORY_CARE_PROVIDER_SITE_OTHER): Payer: Medicare Other | Admitting: Cardiology

## 2019-08-03 ENCOUNTER — Other Ambulatory Visit: Payer: Self-pay

## 2019-08-03 VITALS — BP 106/60 | HR 74 | Ht 69.0 in | Wt 179.0 lb

## 2019-08-03 DIAGNOSIS — I2510 Atherosclerotic heart disease of native coronary artery without angina pectoris: Secondary | ICD-10-CM

## 2019-08-03 DIAGNOSIS — E785 Hyperlipidemia, unspecified: Secondary | ICD-10-CM | POA: Diagnosis not present

## 2019-08-03 NOTE — Patient Instructions (Addendum)
Medication Instructions:  Your physician recommends that you continue on your current medications as directed. Please refer to the Current Medication list given to you today.  If you need a refill on your cardiac medications before your next appointment, please call your pharmacy.   Lab work: None   If you have labs (blood work) drawn today and your tests are completely normal, you will receive your results only by: Marland Kitchen MyChart Message (if you have MyChart) OR . A paper copy in the mail If you have any lab test that is abnormal or we need to change your treatment, we will call you to review the results.  Testing/Procedures: None   Follow-Up: At [facility], you and your health needs are our priority.  As part of our continuing mission to provide you with exceptional heart care, we have created designated Provider Care Teams.  These Care Teams include your primary Cardiologist (physician) and Advanced Practice Providers (APPs -  Physician Assistants and Nurse Practitioners) who all work together to provide you with the care you need, when you need it. You will need a follow up appointment in:  6 months.  Please call our office 2 months in advance to schedule this appointment.  You may see Sherren Mocha, MD or one of the following Advanced Practice Providers on your designated Care Team: Richardson Dopp, PA-C [place], [place] . Daune Perch, NP  Any Other Special Instructions Will Be Listed Below (If Applicable).  Lifestyle Modifications to Prevent and Treat Heart Disease -Recommend heart healthy/Mediterranean diet, with whole grains, fruits, vegetable, fish, lean meats, nuts, and olive oil.  -Limit salt. -Recommend moderate walking, 3-5 times/week for 30-50 minutes each session. Aim for at least 150 minutes.week. Goal should be pace of 3 miles/hours, or walking 1.5 miles in 30 minutes -Recommend avoidance of tobacco products. Avoid excess alcohol. -Keep blood pressure well controlled,  ideally less than 130/80.

## 2019-08-03 NOTE — Progress Notes (Signed)
Cardiology Office Note:    Date:  08/03/2019   ID:  Ronnie Vazquez, DOB 06/16/1946, MRN [id number]  PCP:  Caren Macadam, MD  Cardiologist:  Sherren Mocha, MD  Referring MD: Caren Macadam, MD   Chief Complaint  Patient presents with   Follow-up   Coronary Artery Disease   Hyperlipidemia    History of Present Illness:    Ronnie Vazquez is a 73 y.o. male with a past medical history significant for coronary artery disease status post PCI to the LAD in September 2019. He underwent cardiac catheterization in August 2019 which demonstrated complex bifurcational disease of the proximal LAD/first diagonal with associated CCS class II angina. Coronary artery bypass grafting was initially recommended. However, the patient preferred proceeding with percutaneous coronary intervention. Therefore, he underwent successful, complex bifurcational PCI with DES to the proximal LAD and angioplasty of the first diagonal through the LAD stent struts on 08/29/2018.   He is intolerant of statins and has been placed on Repatha with good results.     Ronnie Vazquez had complaints of tinnitus felt to be related to Plavix so this was switched to ticagrelor which did not resolve his symptoms.  Ronnie Vazquez does not like to take medicine.  His Brilinta was then stopped 6 months post stent. He is still having tinnitus. He has been seen by an audiologist and fitted with hearing aids which has helped his symptoms.   His diet is much better than prior to his stent. He used to eat sausage every morning.   He works full time, always busy, walks 1.25 hours every day. No exertional chest discomfort or shortness of breath. He had his symptosm prior to stent with walking up a hill. Now he walks up that hill without symptoms. No palpitations, orthopnea, PND or edema. No calf/leg pain with walking.  He also reports better sexual functioning since his stent.  Past Medical History:  Diagnosis Date   Chicken pox    hx    Coronary artery disease    08/29/18 PCI/DES with bifurcation from LAD into 1st diag.    Hyperlipidemia    Left shoulder pain 02/02/2013   Microscopic hematuria 02/02/2013   Vision loss of left eye     Past Surgical History:  Procedure Laterality Date   COLONOSCOPY     CORONARY BALLOON ANGIOPLASTY N/A 08/29/2018   Procedure: CORONARY BALLOON ANGIOPLASTY;  Surgeon: Sherren Mocha, MD;  Location: [facility];  Service: Cardiovascular;  Laterality: N/A;   CORONARY STENT INTERVENTION N/A 08/29/2018   Procedure: CORONARY STENT INTERVENTION;  Surgeon: Sherren Mocha, MD;  Location: [facility];  Service: Cardiovascular;  Laterality: N/A;   LEFT HEART CATH AND CORONARY ANGIOGRAPHY N/A 08/08/2018   Procedure: LEFT HEART CATH AND CORONARY ANGIOGRAPHY;  Surgeon: Lorretta Harp, MD;  Location: [facility];  Service: Cardiovascular;  Laterality: N/A;   TONSILLECTOMY AND ADENOIDECTOMY  1959    Current Medications: Current Meds  Medication Sig   acetaminophen (TYLENOL) 325 MG tablet Take 650 mg by mouth at bedtime as needed (for sleep).    aspirin 81 MG tablet Take 81 mg by mouth daily.   cetirizine (ZYRTEC) 10 MG tablet Take 10 mg by mouth daily as needed for allergies.   ezetimibe (ZETIA) 10 MG tablet Take 10 mg by mouth daily.   nitroGLYCERIN (NITROSTAT) 0.4 MG SL tablet Place 1 tablet (0.4 mg total) under the tongue every 5 (five) minutes as needed for chest pain.  Allergies:   Repatha [evolocumab] and Crestor [rosuvastatin calcium]   Social History   Socioeconomic History   Marital status: Married    Spouse name: Not on file   Number of children: Not on file   Years of education: Not on file   Highest education level: Not on file  Occupational History   Not on file  Social Needs   Financial resource strain: Not on file   Food insecurity    Worry: Not on file    Inability: Not on file   Transportation needs    Medical: Not on file     Non-medical: Not on file  Tobacco Use   Smoking status: Never Smoker   Smokeless tobacco: Never Used  Substance and Sexual Activity   Alcohol use: Yes    Alcohol/week: 2.0 standard drinks    Types: 2 Glasses of wine per week   Drug use: No   Sexual activity: Not on file  Lifestyle   Physical activity    Days per week: Not on file    Minutes per session: Not on file   Stress: Not on file  Relationships   Social connections    Talks on phone: Not on file    Gets together: Not on file    Attends religious service: Not on file    Active member of club or organization: Not on file    Attends meetings of clubs or organizations: Not on file    Relationship status: Not on file  Other Topics Concern   Not on file  Social History Narrative   Work or School: Research scientist (life sciences), 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 There is no history of Colon cancer, Esophageal cancer, Pancreatic cancer, Rectal cancer, Stomach cancer, or Heart disease. ROS:   Please see the history of present illness.     All other systems reviewed and are negative.  EKGs/Labs/Other Studies Reviewed:    The following studies were reviewed today:  Percutaneous coronary intervention 08/29/2018 Successful bifurcation PCI with stenting of the proximal LAD using a 3.0x30 mm Resolute Onyx DES and angioplasty of the first diagonal (through the LAD stent struts) with a 3.0 mm balloon    Pre CABG Dopplers 08/10/18 Final Interpretation: Right Carotid: Velocities in the right ICA are consistent with a 1-39% stenosis. Left Carotid: Velocities in the left ICA are consistent with a 1-39% stenosis. Right ABI: Pedal artery waveform within normal  limits. Left ABI: Pedal artery waveform within normal limits.  Cardiac Catheterization8/13/19  Prox LAD-1 lesion is 90% stenosed.  Prox LAD-2 lesion is 70% stenosed.  The left ventricular systolic function is normal.  LV end diastolic pressure is normal.  The left ventricular ejection fraction is 55-65% by visual estimate.  Nuclear stress test8/9/19  The left ventricular ejection fraction is mildly decreased (45-54%).  Nuclear stress EF: 54%.  ST segment depression was noted during stress in the V5, V6, II, III, aVF and I leads.  Blood pressure demonstrated a hypotensive response to exercise.  Defect 1: There is a large defect of severe severity present in the mid anterior, mid anteroseptal, apical anterior, apical  septal and apex location.  Findings consistent with ischemia.  This is a high risk study.  Echo 08/13/2013 Moderate LVH, EF 65   EKG:  EKG is not ordered today.  Recent Labs: 11/28/2018: Hemoglobin 15.2; Platelets 225.0 04/02/2019: TSH 1.87 06/15/2019: ALT 26; BUN 25; Creatinine, Ser 1.19; Potassium 4.3; Sodium 140   Recent Lipid Panel    Component Value Date/Time   CHOL 173 06/15/2019 0809   CHOL 145 09/29/2018 0000   TRIG 145.0 06/15/2019 0809   HDL 44.30 06/15/2019 0809   HDL 39 (L) 09/29/2018 0000   CHOLHDL 4 06/15/2019 0809   VLDL 29.0 06/15/2019 0809   LDLCALC 99 06/15/2019 0809   LDLCALC 75 09/29/2018 0000   LDLDIRECT 117.0 08/12/2017 0921    Physical Exam:    VS:  BP 106/60    Pulse 74    Ht 5\' 9"  (1.753 m)    Wt 179 lb (81.2 kg)    SpO2 96%    BMI 26.43 kg/m     Wt Readings from Last 3 Encounters:  08/03/19 179 lb (81.2 kg)  07/30/19 182 lb (82.6 kg)  01/19/19 182 lb (82.6 kg)     Physical Exam  Constitutional: He is oriented to person, place, and time. He appears well-developed and well-nourished. No distress.  HENT:  Head: Normocephalic and atraumatic.  Neck: Normal range of motion. Neck supple. No JVD present.    Cardiovascular: Normal rate, regular rhythm and intact distal pulses. Exam reveals no gallop and no friction rub.  No murmur heard. Pulmonary/Chest: Effort normal and breath sounds normal. No respiratory distress. He has no wheezes. He has no rales.  Abdominal: Soft. Bowel sounds are normal.  Musculoskeletal: Normal range of motion.        General: No edema.  Neurological: He is alert and oriented to person, place, and time.  Skin: Skin is warm and dry.  Psychiatric: He has a normal mood and affect. His behavior is normal. Judgment and thought content normal.  Vitals reviewed.   ASSESSMENT:    1. Coronary artery disease involving native coronary artery of native heart without angina pectoris   2. Hyperlipidemia, unspecified hyperlipidemia type    PLAN:    In order of problems listed above:  CAD without angina, s/p complex PCI with DES to LAD 08/2018 -Now on aspirin 81 mg alone.  He is statin intolerant and now on the newest lipid-lowering therapy. -Had stress test Monday, ordered about 6 months ago. Excellent results per Dr. Burt Knack.  -He is very active with no anginal symptoms. -Continue current medical therapy as well as healthy lifestyle.  Hyperlipidemia with goal LDL <70 -Intolerant to multiple statins -He was doing well in the past Praluent but stopped it due to cost. -Lipid panel 06/15/2019: TC 173, HDL 44, LDL 99, triglycerides 145. -Started on Nexletol per our lipid clinic. Has been on it for 1 week now and so far tolerated. Follow up lipid panel per lipid clinic on 10/12.    Medication Adjustments/Labs and Tests Ordered: Current medicines are reviewed at length with the patient today.  Concerns regarding medicines are outlined above. Labs and tests ordered and medication changes are outlined in the patient instructions below:  Patient Instructions  Medication Instructions:  Your physician recommends that you continue on your current medications as directed. Please refer  to the Current Medication list given to you today.  If you need a refill on your cardiac medications before your next appointment, please call your pharmacy.  Lab work: None   If you have labs (blood work) drawn today and your tests are completely normal, you will receive your results only by:  [place] (if you have MyChart) OR  A paper copy in the mail If you have any lab test that is abnormal or we need to change your treatment, we will call you to review the results.  Testing/Procedures: None   Follow-Up: At [facility], you and your health needs are our priority.  As part of our continuing mission to provide you with exceptional heart care, we have created designated Provider Care Teams.  These Care Teams include your primary Cardiologist (physician) and Advanced Practice Providers (APPs -  Physician Assistants and Nurse Practitioners) who all work together to provide you with the care you need, when you need it. You will need a follow up appointment in:  6 months.  Please call our office 2 months in advance to schedule this appointment.  You may see Sherren Mocha, MD or one of the following Advanced Practice Providers on your designated Care Team: Richardson Dopp, PA-C [place], [place]  Daune Perch, NP  Any Other Special Instructions Will Be Listed Below (If Applicable).  Lifestyle Modifications to Prevent and Treat Heart Disease -Recommend heart healthy/Mediterranean diet, with whole grains, fruits, vegetable, fish, lean meats, nuts, and olive oil.  -Limit salt. -Recommend moderate walking, 3-5 times/week for 30-50 minutes each session. Aim for at least 150 minutes.week. Goal should be pace of 3 miles/hours, or walking 1.5 miles in 30 minutes -Recommend avoidance of tobacco products. Avoid excess alcohol. -Keep blood pressure well controlled, ideally less than 130/80.      Signed, Daune Perch, NP  08/03/2019 12:57 PM    [facility]

## 2019-10-08 ENCOUNTER — Other Ambulatory Visit: Payer: Medicare Other

## 2019-10-15 ENCOUNTER — Other Ambulatory Visit: Payer: Medicare Other | Admitting: *Deleted

## 2019-10-15 ENCOUNTER — Other Ambulatory Visit: Payer: Self-pay

## 2019-10-15 DIAGNOSIS — E785 Hyperlipidemia, unspecified: Secondary | ICD-10-CM | POA: Diagnosis not present

## 2019-10-15 LAB — COMPREHENSIVE METABOLIC PANEL
ALT: 23 IU/L (ref 0–44)
AST: 29 IU/L (ref 0–40)
Albumin/Globulin Ratio: 1.7 (ref 1.2–2.2)
Albumin: 4.3 g/dL (ref 3.8–4.8)
Alkaline Phosphatase: 56 IU/L (ref 39–117)
BUN/Creatinine Ratio: 16 (ref 10–24)
BUN: 22 mg/dL (ref 8–27)
Bilirubin Total: 0.4 mg/dL (ref 0.0–1.2)
CO2: 23 mmol/L (ref 20–29)
Calcium: 9.5 mg/dL (ref 8.6–10.2)
Chloride: 103 mmol/L (ref 96–106)
Creatinine, Ser: 1.35 mg/dL — ABNORMAL HIGH (ref 0.76–1.27)
GFR calc Af Amer: 61 mL/min/{1.73_m2} (ref >59)
GFR calc non Af Amer: 53 mL/min/{1.73_m2} — ABNORMAL LOW (ref >59)
Globulin, Total: 2.6 g/dL (ref 1.5–4.5)
Glucose: 94 mg/dL (ref 65–99)
Potassium: 4.7 mmol/L (ref 3.5–5.2)
Sodium: 140 mmol/L (ref 134–144)
Total Protein: 6.9 g/dL (ref 6.0–8.5)

## 2019-10-15 LAB — LIPID PANEL
Chol/HDL Ratio: 3.3 ratio (ref 0.0–5.0)
Cholesterol, Total: 160 mg/dL (ref 100–199)
HDL: 49 mg/dL (ref >39)
LDL Chol Calc (NIH): 91 mg/dL (ref 0–99)
Triglycerides: 113 mg/dL (ref 0–149)
VLDL Cholesterol Cal: 20 mg/dL (ref 5–40)

## 2019-10-17 ENCOUNTER — Telehealth: Payer: Self-pay | Admitting: Pharmacist

## 2019-10-17 MED ORDER — PRAVASTATIN SODIUM 20 MG PO TABS: ORAL_TABLET | ORAL | 11 refills | Status: DC

## 2019-10-17 NOTE — Telephone Encounter (Signed)
Called pt to follow up with lipid panel results. He states he stopped taking Nexletol 1 month ago due to shoulder pain. Previously intolerant to rosuvastatin 5mg  twice weekly, Repatha, and Praluent. Tolerating ezetimibe well.  Discussed rechallenge of low dose pravastatin since pt has only tried 1 statin. Pt states he had another incoming call that he had to take and hung up on me.

## 2019-10-17 NOTE — Telephone Encounter (Signed)
Called pt back, he is willing to try low dose pravastatin 10mg  3x per week. He will call if he has trouble tolerating therapy. Otherwise, will plan to recheck lipids in 3 months. If he develops myalgias, will reach out to pt once inclisiran is FDA approved.

## 2019-10-27 DIAGNOSIS — Z23 Encounter for immunization: Secondary | ICD-10-CM | POA: Diagnosis not present

## 2019-11-11 ENCOUNTER — Encounter: Payer: Self-pay | Admitting: Family Medicine

## 2019-11-12 NOTE — Telephone Encounter (Signed)
I called the pt and informed him of the message below.  Offered appt with Dr Ethlyn Gallery on Wednesday and the pt declined as he is working.  Offered an appt for today with another provider and he declined.  Appt scheduled for tomorrow with Dr Jerilee Hoh.  Message sent to Dr Jerilee Hoh also.

## 2019-11-12 NOTE — Telephone Encounter (Signed)
See where we can fit him in this week? Would prefer in office eval if passes COVID screening questions.

## 2019-11-13 ENCOUNTER — Other Ambulatory Visit: Payer: Self-pay

## 2019-11-13 ENCOUNTER — Encounter: Payer: Self-pay | Admitting: Internal Medicine

## 2019-11-13 ENCOUNTER — Ambulatory Visit (INDEPENDENT_AMBULATORY_CARE_PROVIDER_SITE_OTHER): Payer: Medicare Other | Admitting: Internal Medicine

## 2019-11-13 VITALS — BP 110/80 | HR 72 | Temp 97.0°F | Wt 186.1 lb

## 2019-11-13 DIAGNOSIS — E538 Deficiency of other specified B group vitamins: Secondary | ICD-10-CM

## 2019-11-13 DIAGNOSIS — H9313 Tinnitus, bilateral: Secondary | ICD-10-CM

## 2019-11-13 DIAGNOSIS — R61 Generalized hyperhidrosis: Secondary | ICD-10-CM

## 2019-11-13 DIAGNOSIS — I2510 Atherosclerotic heart disease of native coronary artery without angina pectoris: Secondary | ICD-10-CM

## 2019-11-13 LAB — VITAMIN B12: Vitamin B-12: 215 pg/mL (ref 211–911)

## 2019-11-13 NOTE — Progress Notes (Signed)
Established Patient Office Visit     CC/Reason for Visit: Discuss night sweats and hot flashes  HPI: Ronnie Vazquez is a 73 y.o. male who is coming in today for the above mentioned reasons. Past Medical History is significant for: Coronary artery disease with stent placement 14 months ago by Dr. Burt Knack, hyperlipidemia not on statin due to intolerance.  He had a stress test 1 month ago that is reported as normal.  His last LDL was 91.  Last Saturday morning he woke up with the bed drenched in sweat, he also had a flushed feeling, a family member checked his blood pressure and it was noted to be 154/88 which is very abnormal for him.  He wanted to come in the office today for evaluation.  He has not had any chest pain or shortness of breath, no URI symptoms, has been checking his temperature religiously and has not had a fever.  He does mention that he has had some tinnitus which is improved with his hearing aids.  He has mentioned that he is under significant stress and has not been sleeping well with retirement and Covid.   Past Medical/Surgical History: Past Medical History:  Diagnosis Date  . Chicken pox    hx  . Coronary artery disease    08/29/18 PCI/DES with bifurcation from LAD into 1st diag.   Marland Kitchen Hyperlipidemia   . Left shoulder pain 02/02/2013  . Microscopic hematuria 02/02/2013  . Vision loss of left eye     Past Surgical History:  Procedure Laterality Date  . COLONOSCOPY    . CORONARY BALLOON ANGIOPLASTY N/A 08/29/2018   Procedure: CORONARY BALLOON ANGIOPLASTY;  Surgeon: Sherren Mocha, MD;  Location: [facility];  Service: Cardiovascular;  Laterality: N/A;  . CORONARY STENT INTERVENTION N/A 08/29/2018   Procedure: CORONARY STENT INTERVENTION;  Surgeon: Sherren Mocha, MD;  Location: [place] [facility];  Service: Cardiovascular;  Laterality: N/A;  . LEFT HEART CATH AND CORONARY ANGIOGRAPHY N/A 08/08/2018   Procedure: LEFT HEART CATH AND CORONARY ANGIOGRAPHY;  Surgeon:  Lorretta Harp, MD;  Location: [facility];  Service: Cardiovascular;  Laterality: N/A;  . TONSILLECTOMY AND ADENOIDECTOMY  1959    Social History:  reports that he has never smoked. He has never used smokeless tobacco. He reports current alcohol use of about 2.0 standard drinks of alcohol per week. He reports that he does not use drugs.  Allergies: Allergies  Allergen Reactions  . Repatha [Evolocumab] Other (See Comments)    Myalgias  . Bempedoic Acid   . Crestor [Rosuvastatin Calcium]     Family History:  Family History  Problem Relation Age of Onset  . Prostate cancer Father   . Arthritis Mother   . Arthritis/Rheumatoid Brother   . Colon cancer Neg Hx   . Esophageal cancer Neg Hx   . Pancreatic cancer Neg Hx   . Rectal cancer Neg Hx   . Stomach cancer Neg Hx   . Heart disease Neg Hx      Current Outpatient Medications:  .  acetaminophen (TYLENOL) 325 MG tablet, Take 650 mg by mouth at bedtime as needed (for sleep). , Disp: , Rfl:  .  aspirin 81 MG tablet, Take 81 mg by mouth daily., Disp: , Rfl:  .  cetirizine (ZYRTEC) 10 MG tablet, Take 10 mg by mouth daily as needed for allergies., Disp: , Rfl:  .  ezetimibe (ZETIA) 10 MG tablet, Take 10 mg by mouth daily.,  Disp: , Rfl:  .  nitroGLYCERIN (NITROSTAT) 0.4 MG SL tablet, Place 1 tablet (0.4 mg total) under the tongue every 5 (five) minutes as needed for chest pain., Disp: 25 tablet, Rfl: 3 .  pravastatin (PRAVACHOL) 20 MG tablet, Take 1/2 tablet by mouth 3 days a week as tolerated. (Patient not taking: Reported on 11/13/2019), Disp: 15 tablet, Rfl: 11  Review of Systems:  Constitutional: Denies fever,  appetite change and fatigue.  HEENT: Denies photophobia, eye pain, redness, hearing loss, ear pain, congestion, sore throat, rhinorrhea, sneezing, mouth sores, trouble swallowing, neck pain, neck stiffness and tinnitus.   Respiratory: Denies SOB, DOE, cough, chest tightness,  and wheezing.   Cardiovascular: Denies  chest pain, palpitations and leg swelling.  Gastrointestinal: Denies nausea, vomiting, abdominal pain, diarrhea, constipation, blood in stool and abdominal distention.  Genitourinary: Denies dysuria, urgency, frequency, hematuria, flank pain and difficulty urinating.  Endocrine: Denies: hot or cold intolerance, sweats, changes in hair or nails, polyuria, polydipsia. Musculoskeletal: Denies myalgias, back pain, joint swelling, arthralgias and gait problem.  Skin: Denies pallor, rash and wound.  Neurological: Denies dizziness, seizures, syncope, weakness, light-headedness, numbness and headaches.  Hematological: Denies adenopathy. Easy bruising, personal or family bleeding history  Psychiatric/Behavioral: Denies suicidal ideation, mood changes, confusion, nervousness, sleep disturbance and agitation    Physical Exam: Vitals:   11/13/19 0755  BP: 110/80  Pulse: 72  Temp: (!) 97 F (36.1 C)  TempSrc: Temporal  SpO2: 94%  Weight: 186 lb 1.6 oz (84.4 kg)    Body mass index is 27.48 kg/m.   Constitutional: NAD, calm, comfortable Eyes: PERRL, lids and conjunctivae normal ENMT: Mucous membranes are moist. Respiratory: clear to auscultation bilaterally, no wheezing, no crackles. Normal respiratory effort. No accessory muscle use.  Cardiovascular: Regular rate and rhythm, no murmurs / rubs / gallops. No extremity edema. 2+ pedal pulses.   Abdomen: no tenderness, no masses palpated. No hepatosplenomegaly. Bowel sounds positive.  Musculoskeletal: no clubbing / cyanosis. No joint deformity upper and lower extremities. Good ROM, no contractures. Normal muscle tone.  Skin: no rashes, lesions, ulcers. No induration Neurologic: Grossly intact and nonfocal Psychiatric: Normal judgment and insight. Alert and oriented x 3. Normal mood.    Impression and Plan:  Night sweats Tinnitus of both ears -Etiology is unclear to me. -It is certainly reassuring that he had a normal stress test 1 month  ago, for this reason do not believe we need to investigate cardiac etiologies. -He had a normal TSH back in April and is not known to have any thyroid conditions.  Rest of blood work in October was also within normal limits. -Check vitamin B12 level today as it may be playing a role in his tinnitus. -At this point in time would recommend observation. -Blood pressure in office today taken twice: 110/80, 120/85.    Time spent: 26 minutes. Greater than 50% of this time was spent in direct contact with the patient, coordinating care and discussing relevant ongoing clinical issues.   Patient Instructions  -Nice seeing you today!!  -Lab work today; will notify you once results are available.       Lelon Frohlich, MD [facility]

## 2019-11-13 NOTE — Patient Instructions (Signed)
-  Nice seeing you today!!  -Lab work today; will notify you once results are available.

## 2019-11-14 ENCOUNTER — Encounter: Payer: Self-pay | Admitting: Internal Medicine

## 2019-11-14 DIAGNOSIS — E538 Deficiency of other specified B group vitamins: Secondary | ICD-10-CM | POA: Insufficient documentation

## 2019-11-16 ENCOUNTER — Telehealth: Payer: Medicare Other | Admitting: Family Medicine

## 2019-11-16 ENCOUNTER — Other Ambulatory Visit: Payer: Self-pay

## 2019-11-19 ENCOUNTER — Other Ambulatory Visit: Payer: Self-pay

## 2019-11-19 ENCOUNTER — Ambulatory Visit (INDEPENDENT_AMBULATORY_CARE_PROVIDER_SITE_OTHER): Payer: Medicare Other | Admitting: *Deleted

## 2019-11-19 DIAGNOSIS — E538 Deficiency of other specified B group vitamins: Secondary | ICD-10-CM

## 2019-11-19 MED ORDER — CYANOCOBALAMIN 1000 MCG/ML IJ SOLN
1000.0000 ug | Freq: Once | INTRAMUSCULAR | Status: AC
  Administered 2019-11-19: 1000 ug via INTRAMUSCULAR

## 2019-11-19 NOTE — Progress Notes (Signed)
Per orders of Dr. Koberlein, injection of Cyanocobalamin 1000mcg given by Sadonna Kotara A. Patient tolerated injection well. 

## 2019-11-21 ENCOUNTER — Other Ambulatory Visit: Payer: Self-pay

## 2019-12-18 ENCOUNTER — Other Ambulatory Visit: Payer: Self-pay

## 2019-12-19 ENCOUNTER — Ambulatory Visit (INDEPENDENT_AMBULATORY_CARE_PROVIDER_SITE_OTHER): Payer: Medicare Other

## 2019-12-19 DIAGNOSIS — E538 Deficiency of other specified B group vitamins: Secondary | ICD-10-CM | POA: Diagnosis not present

## 2019-12-19 MED ORDER — CYANOCOBALAMIN 1000 MCG/ML IJ SOLN
1000.0000 ug | Freq: Once | INTRAMUSCULAR | Status: AC
  Administered 2019-12-19: 1000 ug via INTRAMUSCULAR

## 2019-12-19 NOTE — Progress Notes (Signed)
Per orders of Dr.Koberlein, injection of B12 given in R. deltoid given by Franco Collet. Patient tolerated injection well.

## 2020-01-04 DIAGNOSIS — H52221 Regular astigmatism, right eye: Secondary | ICD-10-CM | POA: Diagnosis not present

## 2020-01-04 DIAGNOSIS — H2513 Age-related nuclear cataract, bilateral: Secondary | ICD-10-CM | POA: Diagnosis not present

## 2020-01-04 DIAGNOSIS — H1712 Central corneal opacity, left eye: Secondary | ICD-10-CM | POA: Diagnosis not present

## 2020-01-04 DIAGNOSIS — H5211 Myopia, right eye: Secondary | ICD-10-CM | POA: Diagnosis not present

## 2020-01-13 DIAGNOSIS — Z03818 Encounter for observation for suspected exposure to other biological agents ruled out: Secondary | ICD-10-CM | POA: Diagnosis not present

## 2020-01-13 DIAGNOSIS — Z20828 Contact with and (suspected) exposure to other viral communicable diseases: Secondary | ICD-10-CM | POA: Diagnosis not present

## 2020-01-21 ENCOUNTER — Other Ambulatory Visit: Payer: Self-pay

## 2020-01-21 ENCOUNTER — Ambulatory Visit (INDEPENDENT_AMBULATORY_CARE_PROVIDER_SITE_OTHER): Payer: Medicare Other

## 2020-01-21 DIAGNOSIS — E538 Deficiency of other specified B group vitamins: Secondary | ICD-10-CM | POA: Diagnosis not present

## 2020-01-21 MED ORDER — CYANOCOBALAMIN 1000 MCG/ML IJ SOLN
1000.0000 ug | Freq: Once | INTRAMUSCULAR | Status: AC
  Administered 2020-01-21: 1000 ug via INTRAMUSCULAR

## 2020-01-21 NOTE — Progress Notes (Signed)
Per orders of Dr. Ethlyn Gallery, injection of B12 given in Left Deltoid. Patient tolerated injection well.

## 2020-01-31 ENCOUNTER — Other Ambulatory Visit: Payer: Self-pay

## 2020-01-31 ENCOUNTER — Ambulatory Visit: Payer: Medicare Other | Admitting: Cardiology

## 2020-01-31 ENCOUNTER — Encounter: Payer: Self-pay | Admitting: Cardiology

## 2020-01-31 ENCOUNTER — Ambulatory Visit (INDEPENDENT_AMBULATORY_CARE_PROVIDER_SITE_OTHER): Payer: Medicare Other | Admitting: Cardiology

## 2020-01-31 VITALS — BP 118/82 | HR 70 | Ht 70.0 in | Wt 187.0 lb

## 2020-01-31 DIAGNOSIS — I2510 Atherosclerotic heart disease of native coronary artery without angina pectoris: Secondary | ICD-10-CM | POA: Diagnosis not present

## 2020-01-31 DIAGNOSIS — E7800 Pure hypercholesterolemia, unspecified: Secondary | ICD-10-CM | POA: Diagnosis not present

## 2020-01-31 MED ORDER — PRAVASTATIN SODIUM 20 MG PO TABS: 10.0000 mg | ORAL_TABLET | Freq: Three times a day (TID) | ORAL | 3 refills | Status: DC

## 2020-01-31 NOTE — Progress Notes (Addendum)
Cardiology Office Note:    Date:  01/31/2020   ID:  Ronnie Vazquez, DOB [birth year], MRN [id number]  PCP:  Caren Macadam, MD  Cardiologist:  Sherren Mocha, MD  Referring MD: Caren Macadam, MD   No chief complaint on file.   History of Present Illness:    Ronnie Vazquez is a 74 y.o. male with a past medical history significant for coronary artery disease status post PCI to the LAD in September 2019. He underwent cardiac catheterization in August 2019 which demonstrated complex bifurcational disease of the proximal LAD/first diagonal with associated CCS class II angina. Coronary artery bypass grafting was initially recommended. However, the patient preferred proceeding with percutaneous coronary intervention. Therefore, he underwent successful, complex bifurcational PCI with DES to the proximal LAD and angioplasty of the first diagonal through the LAD stent struts on 08/29/2018.   He is intolerant of statins and has been placed on Repatha with good results.     Ronnie Vazquez had complaints of tinnitus felt to be related to Plavix so this was switched to ticagrelor which did not resolve his symptoms.  Ronnie Vazquez does not like to take medicine.  His Vazquez was then stopped 6 months post stent. He is still having tinnitus. He has been seen by an audiologist and fitted with hearing aids which has helped his symptoms.   His diet is much better than prior to his stent. He used to eat sausage every morning.   He works full time, always busy, walks 1.5 hours a day alternating with exercise at [employer] every other day with 30 minutes on the treadmill, 30 minutes on crosstraining and some weight training. He denies chest pain/pressure, shortness of breath, orthopnea, PND, edema, palpitations, lightheadedness or syncope.   He is now using hearing aids which reduces his ear ringing during the day but he still has ear ringing during the night.  He was told by his audiologist that this was  related to advanced age.  He was found to be B12 deficient and is now on B12 injections.  He has never had low energy.  He notices no difference with the B12 supplementation.  He never started the pravastatin as recommended by the lipid clinic due to fear of side effects.  Past Medical History:  Diagnosis Date  . Chicken pox    hx  . Coronary artery disease    08/29/18 PCI/DES with bifurcation from LAD into 1st diag.   Marland Kitchen Hyperlipidemia   . Left shoulder pain 02/02/2013  . Microscopic hematuria 02/02/2013  . Vision loss of left eye     Past Surgical History:  Procedure Laterality Date  . COLONOSCOPY    . CORONARY BALLOON ANGIOPLASTY N/A 08/29/2018   Procedure: CORONARY BALLOON ANGIOPLASTY;  Surgeon: Sherren Mocha, MD;  Location: [place];  Service: Cardiovascular;  Laterality: N/A;  . CORONARY STENT INTERVENTION N/A 08/29/2018   Procedure: CORONARY STENT INTERVENTION;  Surgeon: Sherren Mocha, MD;  Location: [facility];  Service: Cardiovascular;  Laterality: N/A;  . LEFT HEART CATH AND CORONARY ANGIOGRAPHY N/A 08/08/2018   Procedure: LEFT HEART CATH AND CORONARY ANGIOGRAPHY;  Surgeon: Lorretta Harp, MD;  Location: [facility];  Service: Cardiovascular;  Laterality: N/A;  . TONSILLECTOMY AND ADENOIDECTOMY  1959    Current Medications: Current Meds  Medication Sig  . acetaminophen (TYLENOL) 325 MG tablet Take 650 mg by mouth at bedtime as needed (for sleep).   Marland Kitchen aspirin 81 MG tablet  Take 81 mg by mouth daily.  . cetirizine (ZYRTEC) 10 MG tablet Take 10 mg by mouth daily as needed for allergies.  Marland Kitchen ezetimibe (ZETIA) 10 MG tablet Take 10 mg by mouth daily.  . nitroGLYCERIN (NITROSTAT) 0.4 MG SL tablet Place 1 tablet (0.4 mg total) under the tongue every 5 (five) minutes as needed for chest pain.     Allergies:   Repatha [evolocumab], Bempedoic acid, and Crestor [rosuvastatin calcium]   Social History   Socioeconomic History  . Marital status: Married     Spouse name: Not on file  . Number of children: Not on file  . Years of education: Not on file  . Highest education level: Not on file  Occupational History  . Not on file  Tobacco Use  . Smoking status: Never Smoker  . Smokeless tobacco: Never Used  Substance and Sexual Activity  . Alcohol use: Yes    Alcohol/week: 2.0 standard drinks    Types: 2 Glasses of wine per week  . Drug use: No  . Sexual activity: Not on file  Other Topics Concern  . 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 Difficulty of Paying Living Expenses: Not on file  Food Insecurity:   . Worried About Charity fundraiser in the Last Year: Not on file  . Ran Out of Food in the Last Year: Not on file  Transportation Needs:   . Lack of Transportation (Medical): Not on file  . Lack of Transportation (Non-Medical): Not on file  Physical Activity:   . Days of Exercise per Week: Not on file  . Minutes of Exercise per Session: Not on file  Stress:   . Feeling of Stress : Not on file  Social Connections:   . Frequency of Communication with Friends and Family: Not on file  . Frequency of Social Gatherings with Friends and Family: Not on file  . Attends Religious Services: Not on file  . Active Member of Clubs or Organizations: Not on file  . Attends Archivist Meetings: Not on file  . Marital Status: Not on file     Family History: The patient's family history includes Arthritis in his mother; Arthritis/Rheumatoid in his brother; Prostate cancer in his father. There is no history of Colon cancer, Esophageal cancer, Pancreatic cancer, Rectal cancer, Stomach cancer, or Heart disease. ROS:   Please see the history of present illness.     All other  systems reviewed and are negative.  EKGs/Labs/Other Studies Reviewed:    The following studies were reviewed today:  Percutaneous coronary intervention 08/29/2018 Successful bifurcation PCI with stenting of the proximal LAD using a 3.0x30 mm Resolute Onyx DES and angioplasty of the first diagonal (through the LAD stent struts) with a 3.0 mm balloon    Pre CABG Dopplers 08/10/18 Final Interpretation: Right Carotid: Velocities in the right ICA are consistent with a 1-39% stenosis. Left Carotid: Velocities in the left ICA are consistent with a 1-39% stenosis. Right ABI: Pedal artery waveform within normal limits. Left ABI: Pedal artery waveform within normal limits.  Cardiac Catheterization8/13/19  Prox LAD-1  lesion is 90% stenosed.  Prox LAD-2 lesion is 70% stenosed.  The left ventricular systolic function is normal.  LV end diastolic pressure is normal.  The left ventricular ejection fraction is 55-65% by visual estimate.  Nuclear stress test8/9/19  The left ventricular ejection fraction is mildly decreased (45-54%).  Nuclear stress EF: 54%.  ST segment depression was noted during stress in the V5, V6, II, III, aVF and I leads.  Blood pressure demonstrated a hypotensive response to exercise.  Defect 1: There is a large defect of severe severity present in the mid anterior, mid anteroseptal, apical anterior, apical septal and apex location.  Findings consistent with ischemia.  This is a high risk study.  Echo 08/13/2013 Moderate LVH, EF 65   EKG: Normal sinus rhythm, 70 bpm, QTC 429.  Recent Labs: 04/02/2019: TSH 1.87 10/15/2019: ALT 23; BUN 22; Creatinine, Ser 1.35; Potassium 4.7; Sodium 140   Recent Lipid Panel    Component Value Date/Time   CHOL 160 10/15/2019 0906   TRIG 113 10/15/2019 0906   HDL 49 10/15/2019 0906   CHOLHDL 3.3 10/15/2019 0906   CHOLHDL 4 06/15/2019 0809   VLDL 29.0 06/15/2019 0809   LDLCALC 91 10/15/2019 0906   LDLDIRECT  117.0 08/12/2017 0921    Physical Exam:    VS:  BP 118/82   Pulse 70   Ht 5\' 10"  (1.778 m)   Wt 187 lb (84.8 kg)   SpO2 96%   BMI 26.83 kg/m     Wt Readings from Last 3 Encounters:  01/31/20 187 lb (84.8 kg)  11/13/19 186 lb 1.6 oz (84.4 kg)  08/03/19 179 lb (81.2 kg)     Physical Exam  Constitutional: He is oriented to person, place, and time. He appears well-developed and well-nourished. No distress.  HENT:  Head: Normocephalic and atraumatic.  Neck: No JVD present.  Cardiovascular: Normal rate, regular rhythm and intact distal pulses. Exam reveals no gallop and no friction rub.  No murmur heard. Pulmonary/Chest: Effort normal and breath sounds normal. No respiratory distress. He has no wheezes. He has no rales.  Abdominal: Soft. Bowel sounds are normal.  Musculoskeletal:        General: No edema. Normal range of motion.     Cervical back: Normal range of motion and neck supple.  Neurological: He is alert and oriented to person, place, and time.  Skin: Skin is warm and dry.  Psychiatric: He has a normal mood and affect. His behavior is normal. Judgment and thought content normal.  Vitals reviewed.   ASSESSMENT:    1. Coronary artery disease involving native coronary artery of native heart without angina pectoris   2. Hypercholesterolemia    PLAN:    In order of problems listed above:  CAD without angina, s/p complex PCI with DES to LAD 08/2018 -Now on aspirin 81 mg alone.  He had issues with Plavix and Vazquez.  He has a history of statin intolerance and is being followed in the lipid clinic.  -Had low risk stress test 07/30/2019 -He is very active with no anginal symptoms. -Continue current medical therapy as well as healthy lifestyle.  Hyperlipidemia with goal LDL <70 -Intolerant to multiple statins.  Now followed in our lipid clinic.  Currently on ezetimibe.  He did not tolerate the PCSK9 inhibitors or Nexletol. -He was to start Low-dose pravastatin 3 days per  week but he never started it for fear of side effects. -Lipid panel 10/15/2019: TC 160, HDL 49, LDL 91, triglycerides  113.Hyperlipidemia with goal LDL <70 -We discussed the benefits of statin including reducing inflammation of the artery walls possibly leading to some plaque progression as well as reducing future risk of heart attack, stroke and other vessel related diseases.  He agrees to try taking the pravastatin as previously ordered.  If he does not tolerate this, we are essentially out of options.  He has greatly worked on his diet and is eating pretty much optimally.   Medication Adjustments/Labs and Tests Ordered: Current medicines are reviewed at length with the patient today.  Concerns regarding medicines are outlined above. Labs and tests ordered and medication changes are outlined in the patient instructions below:  Patient Instructions  Medication Instructions:  Start pravastatin 10 mg 3 times a week   *If you need a refill on your cardiac medications before your next appointment, please call your pharmacy*  Lab Work: None ordered   If you have labs (blood work) drawn today and your tests are completely normal, you will receive your results only by: Marland Kitchen MyChart Message (if you have MyChart) OR . A paper copy in the mail If you have any lab test that is abnormal or we need to change your treatment, we will call you to review the results.  Testing/Procedures: None ordered  Follow-Up: At [facility], you and your health needs are our priority.  As part of our continuing mission to provide you with exceptional heart care, we have created designated Provider Care Teams.  These Care Teams include your primary Cardiologist (physician) and Advanced Practice Providers (APPs -  Physician Assistants and Nurse Practitioners) who all work together to provide you with the care you need, when you need it.  Your next appointment:   6 month(s)  The format for your next appointment:   In  Person  Provider:   You may see Sherren Mocha, MD or one of the following Advanced Practice Providers on your designated Care Team:    Melina Copa, PA-C  Ermalinda Barrios, PA-C   Other Instructions       Signed, Daune Perch, NP  01/31/2020 4:02 PM    [place]

## 2020-01-31 NOTE — Patient Instructions (Signed)
Medication Instructions:  Start pravastatin 10 mg 3 times a week   *If you need a refill on your cardiac medications before your next appointment, please call your pharmacy*  Lab Work: None ordered   If you have labs (blood work) drawn today and your tests are completely normal, you will receive your results only by: Marland Kitchen MyChart Message (if you have MyChart) OR . A paper copy in the mail If you have any lab test that is abnormal or we need to change your treatment, we will call you to review the results.  Testing/Procedures: None ordered  Follow-Up: At [facility], you and your health needs are our priority.  As part of our continuing mission to provide you with exceptional heart care, we have created designated Provider Care Teams.  These Care Teams include your primary Cardiologist (physician) and Advanced Practice Providers (APPs -  Physician Assistants and Nurse Practitioners) who all work together to provide you with the care you need, when you need it.  Your next appointment:   6 month(s)  The format for your next appointment:   In Person  Provider:   You may see Sherren Mocha, MD or one of the following Advanced Practice Providers on your designated Care Team:    Melina Copa, PA-C  Ermalinda Barrios, PA-C   Other Instructions

## 2020-01-31 NOTE — Progress Notes (Deleted)
Cardiology Office Note:    Date:  01/31/2020   ID:  Ronnie Vazquez, DOB October 28, 1946, MRN [id number]  PCP:  Caren Macadam, MD  Cardiologist:  Sherren Mocha, MD  Referring MD: Caren Macadam, MD   No chief complaint on file.   History of Present Illness:    Ronnie Vazquez is a 74 y.o. male with a past medical history significant for coronary artery disease status post PCI to the LAD in September 2019. He underwent cardiac catheterization in August 2019 which demonstrated complex bifurcational disease of the proximal LAD/first diagonal with associated CCS class II angina. Coronary artery bypass grafting was initially recommended. However, the patient preferred proceeding with percutaneous coronary intervention. Therefore, he underwent successful, complex bifurcational PCI with DES to the proximal LAD and angioplasty of the first diagonal through the LAD stent struts on 08/29/2018.   He is intolerant of statins and has been placed on Repatha with good results.     Ronnie Vazquez had complaints of tinnitus felt to be related to Plavix so this was switched to ticagrelor which did not resolve his symptoms.  Ronnie Vazquez does not like to take medicine.  His Brilinta was then stopped 6 months post stent. He is still having tinnitus. He has been seen by an audiologist and fitted with hearing aids which has helped his symptoms.   Ronnie Vazquez is here today for 6 month follow up  The patient is being followed in our lipid clinic.  He was trialed on Nexletol but discontinued it due to shoulder pain.  He was previously intolerant to rosuvastatin 5 mg twice weekly, Repatha and Praluent.  He is tolerating ezetimibe.  He is now trying low dose pravastatin 10 mg 3 times per week per our pharmacist in the lipid clinic.         His diet is much better than prior to his stent. He used to eat sausage every morning.           He works full time, always busy, walks 1.25 hours every day. No  exertional chest discomfort or shortness of breath. He had his symptosm prior to stent with walking up a hill. Now he walks up that hill without symptoms. No palpitations, orthopnea, PND or edema. No calf/leg pain with walking.  He also reports better sexual functioning since his stent.  Past Medical History:  Diagnosis Date  . Chicken pox    hx  . Coronary artery disease    08/29/18 PCI/DES with bifurcation from LAD into 1st diag.   Marland Kitchen Hyperlipidemia   . Left shoulder pain 02/02/2013  . Microscopic hematuria 02/02/2013  . Vision loss of left eye     Past Surgical History:  Procedure Laterality Date  . COLONOSCOPY    . CORONARY BALLOON ANGIOPLASTY N/A 08/29/2018   Procedure: CORONARY BALLOON ANGIOPLASTY;  Surgeon: Sherren Mocha, MD;  Location: [facility];  Service: Cardiovascular;  Laterality: N/A;  . CORONARY STENT INTERVENTION N/A 08/29/2018   Procedure: CORONARY STENT INTERVENTION;  Surgeon: Sherren Mocha, MD;  Location: [facility];  Service: Cardiovascular;  Laterality: N/A;  . LEFT HEART CATH AND CORONARY ANGIOGRAPHY N/A 08/08/2018   Procedure: LEFT HEART CATH AND CORONARY ANGIOGRAPHY;  Surgeon: Lorretta Harp, MD;  Location: [facility];  Service: Cardiovascular;  Laterality: N/A;  . TONSILLECTOMY AND ADENOIDECTOMY  1959    Current Medications: No outpatient medications have been marked as taking for the 01/31/20 encounter (Appointment) with Daune Perch,  NP.     Allergies:   Repatha [evolocumab], Bempedoic acid, and Crestor [rosuvastatin calcium]   Social History   Socioeconomic History  . Marital status: Married    Spouse name: Not on file  . Number of children: Not on file  . Years of education: Not on file  . Highest education level: Not on file  Occupational History  . Not on file  Tobacco Use  . Smoking status: Never Smoker  . Smokeless tobacco: Never Used  Substance and Sexual Activity  . Alcohol use: Yes    Alcohol/week: 2.0 standard  drinks    Types: 2 Glasses of wine per week  . Drug use: No  . Sexual activity: Not on file  Other Topics Concern  . 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 Difficulty of Paying Living Expenses: Not on file  Food Insecurity:   . Worried About Charity fundraiser in the Last Year: Not on file  . Ran Out of Food in the Last Year: Not on file  Transportation Needs:   . Lack of Transportation (Medical): Not on file  . Lack of Transportation (Non-Medical): Not on file  Physical Activity:   . Days of Exercise per Week: Not on file  . Minutes of Exercise per Session: Not on file  Stress:   . Feeling of Stress : Not on file  Social Connections:   . Frequency of Communication with Friends and Family: Not on file  . Frequency of Social Gatherings with Friends and Family: Not on file  . Attends Religious Services: Not on file  . Active Member of Clubs or Organizations: Not on file  . Attends Archivist Meetings: Not on file  . Marital Status: Not on file     Family History: The patient's family history includes Arthritis in his mother; Arthritis/Rheumatoid in his brother; Prostate cancer in his father. There is no history of Colon cancer, Esophageal cancer, Pancreatic cancer, Rectal cancer, Stomach cancer, or Heart disease. ROS:   Please see the history of present illness.     All other systems reviewed and are negative.  EKGs/Labs/Other Studies Reviewed:    The following studies were reviewed today:  Lexiscan Myoview 07/30/2019 Study Highlights   Nuclear stress EF: 60%. The left ventricular ejection fraction is normal (55-65%).  There was no ST segment deviation noted during stress.  This is a low risk study. No  evidence of ischemia and no evidence of infarction  The study is normal.    Percutaneous coronary intervention 08/29/2018 Successful bifurcation PCI with stenting of the proximal LAD using a 3.0x30 mm Resolute Onyx DES and angioplasty of the first diagonal (through the LAD stent struts) with a 3.0 mm balloon    Pre CABG Dopplers 08/10/18 Final Interpretation: Right Carotid: Velocities in the right ICA are consistent with a 1-39% stenosis. Left Carotid: Velocities in the left ICA are consistent with a 1-39% stenosis. Right ABI: Pedal artery waveform within normal limits. Left ABI: Pedal artery waveform within normal limits.  Cardiac Catheterization8/13/19  Prox LAD-1 lesion is 90% stenosed.  Prox LAD-2 lesion  is 70% stenosed.  The left ventricular systolic function is normal.  LV end diastolic pressure is normal.  The left ventricular ejection fraction is 55-65% by visual estimate.  Nuclear stress test8/9/19  The left ventricular ejection fraction is mildly decreased (45-54%).  Nuclear stress EF: 54%.  ST segment depression was noted during stress in the V5, V6, II, III, aVF and I leads.  Blood pressure demonstrated a hypotensive response to exercise.  Defect 1: There is a large defect of severe severity present in the mid anterior, mid anteroseptal, apical anterior, apical septal and apex location.  Findings consistent with ischemia.  This is a high risk study.  Echo 08/13/2013 Moderate LVH, EF 65   EKG:  EKG is not ordered today.  Recent Labs: 04/02/2019: TSH 1.87 10/15/2019: ALT 23; BUN 22; Creatinine, Ser 1.35; Potassium 4.7; Sodium 140   Recent Lipid Panel    Component Value Date/Time   CHOL 160 10/15/2019 0906   TRIG 113 10/15/2019 0906   HDL 49 10/15/2019 0906   CHOLHDL 3.3 10/15/2019 0906   CHOLHDL 4 06/15/2019 0809   VLDL 29.0 06/15/2019 0809   LDLCALC 91 10/15/2019 0906   LDLDIRECT 117.0 08/12/2017 0921    Physical Exam:    VS:   There were no vitals taken for this visit.    Wt Readings from Last 3 Encounters:  11/13/19 186 lb 1.6 oz (84.4 kg)  08/03/19 179 lb (81.2 kg)  07/30/19 182 lb (82.6 kg)     Physical Exam  Constitutional: He is oriented to person, place, and time. He appears well-developed and well-nourished. No distress.  HENT:  Head: Normocephalic and atraumatic.  Neck: No JVD present.  Cardiovascular: Normal rate, regular rhythm and intact distal pulses. Exam reveals no gallop and no friction rub.  No murmur heard. Pulmonary/Chest: Effort normal and breath sounds normal. No respiratory distress. He has no wheezes. He has no rales.  Abdominal: Soft. Bowel sounds are normal.  Musculoskeletal:        General: No edema. Normal range of motion.     Cervical back: Normal range of motion and neck supple.  Neurological: He is alert and oriented to person, place, and time.  Skin: Skin is warm and dry.  Psychiatric: He has a normal mood and affect. His behavior is normal. Judgment and thought content normal.  Vitals reviewed.   ASSESSMENT:    No diagnosis found. PLAN:    In order of problems listed above:  CAD without angina, s/p complex PCI with DES to LAD 08/2018 -Now on aspirin 81 mg alone.  He had issues with Plavix and Brilinta.  He has a history of statin intolerance and is being followed in the lipid clinic.  -Had low risk stress test 07/30/2019 -He is very active with no anginal symptoms. -Continue current medical therapy as well as healthy lifestyle.  Hyperlipidemia with goal LDL <70 -Intolerant to multiple statins.  Now followed in our lipid clinic.  Currently on ezetimibe.  -?  Low-dose pravastatin  -Lipid panel 10/15/2019: TC 160, HDL 49, LDL 91, triglycerides 113.  Medication Adjustments/Labs and Tests Ordered: Current medicines are reviewed at length with the patient today.  Concerns regarding medicines are outlined above. Labs and tests ordered and medication changes are outlined in  the patient instructions below:  There are no Patient Instructions on file for this visit.   Signed, Daune Perch, NP  01/31/2020 7:05 AM    [place]

## 2020-02-07 ENCOUNTER — Ambulatory Visit: Payer: Medicare Other | Admitting: Cardiovascular Disease

## 2020-02-19 ENCOUNTER — Other Ambulatory Visit: Payer: Self-pay

## 2020-02-20 ENCOUNTER — Ambulatory Visit (INDEPENDENT_AMBULATORY_CARE_PROVIDER_SITE_OTHER): Payer: Medicare Other | Admitting: Family Medicine

## 2020-02-20 ENCOUNTER — Encounter: Payer: Self-pay | Admitting: Family Medicine

## 2020-02-20 VITALS — BP 96/60 | HR 82 | Temp 96.6°F | Ht 70.0 in | Wt 187.2 lb

## 2020-02-20 DIAGNOSIS — E538 Deficiency of other specified B group vitamins: Secondary | ICD-10-CM | POA: Diagnosis not present

## 2020-02-20 DIAGNOSIS — R251 Tremor, unspecified: Secondary | ICD-10-CM

## 2020-02-20 LAB — COMPREHENSIVE METABOLIC PANEL
ALT: 29 U/L (ref 0–53)
AST: 24 U/L (ref 0–37)
Albumin: 4.2 g/dL (ref 3.5–5.2)
Alkaline Phosphatase: 51 U/L (ref 39–117)
BUN: 33 mg/dL — ABNORMAL HIGH (ref 6–23)
CO2: 22 mEq/L (ref 19–32)
Calcium: 9.6 mg/dL (ref 8.4–10.5)
Chloride: 105 mEq/L (ref 96–112)
Creatinine, Ser: 1.34 mg/dL (ref 0.40–1.50)
GFR: 52.51 mL/min — ABNORMAL LOW (ref >60.00)
Glucose, Bld: 104 mg/dL — ABNORMAL HIGH (ref 70–99)
Potassium: 4.7 mEq/L (ref 3.5–5.1)
Sodium: 137 mEq/L (ref 135–145)
Total Bilirubin: 0.5 mg/dL (ref 0.2–1.2)
Total Protein: 6.7 g/dL (ref 6.0–8.3)

## 2020-02-20 LAB — CBC WITH DIFFERENTIAL/PLATELET
Basophils Absolute: 0.1 10*3/uL (ref 0.0–0.1)
Basophils Relative: 1.1 % (ref 0.0–3.0)
Eosinophils Absolute: 0.2 10*3/uL (ref 0.0–0.7)
Eosinophils Relative: 2.9 % (ref 0.0–5.0)
HCT: 44.8 % (ref 39.0–52.0)
Hemoglobin: 15.3 g/dL (ref 13.0–17.0)
Lymphocytes Relative: 24.8 % (ref 12.0–46.0)
Lymphs Abs: 1.4 10*3/uL (ref 0.7–4.0)
MCHC: 34.2 g/dL (ref 30.0–36.0)
MCV: 97.7 fl (ref 78.0–100.0)
Monocytes Absolute: 0.8 10*3/uL (ref 0.1–1.0)
Monocytes Relative: 13.2 % — ABNORMAL HIGH (ref 3.0–12.0)
Neutro Abs: 3.4 10*3/uL (ref 1.4–7.7)
Neutrophils Relative %: 58 % (ref 43.0–77.0)
Platelets: 213 10*3/uL (ref 150.0–400.0)
RBC: 4.59 Mil/uL (ref 4.22–5.81)
RDW: 13.6 % (ref 11.5–15.5)
WBC: 5.8 10*3/uL (ref 4.0–10.5)

## 2020-02-20 LAB — FOLATE: Folate: 15 ng/mL (ref >5.9)

## 2020-02-20 LAB — VITAMIN B12: Vitamin B-12: 424 pg/mL (ref 211–911)

## 2020-02-20 LAB — AMMONIA: Ammonia: 37 umol/L — ABNORMAL HIGH (ref 11–35)

## 2020-02-20 LAB — TSH: TSH: 2.03 u[IU]/mL (ref 0.35–4.50)

## 2020-02-20 NOTE — Progress Notes (Signed)
Ronnie Vazquez DOB: [birth year] Encounter date: 02/20/2020  This is a 74 y.o. male who presents with Chief Complaint  Patient presents with  . Tremors    patient complains of tremors in hands x5 months, now notices same sensation in the entire body    History of present illness: Night sweats went away.   Has shaking in both hands. About a month after his surgery his ears started ringing. If he lays on pillow even getting some vibration in teeth/seems to radiated through body. Shaking in both of legs.   Exercises a decent amount. Does treadmill for 30 minutes. Then 30 minutes cross trainer. Then lifts weights. Only time he notes the shaking is when putting hands above head to put in screw driver.   Stopped taking the pravastatin.   First time he felt the shaking was when driving; he noted this and felt like he was cold, but wasn't.   Allergies  Allergen Reactions  . Repatha [Evolocumab] Other (See Comments)    Myalgias  . Bempedoic Acid   . Crestor [Rosuvastatin Calcium]    Current Meds  Medication Sig  . acetaminophen (TYLENOL) 325 MG tablet Take 650 mg by mouth at bedtime as needed (for sleep).   Marland Kitchen aspirin 81 MG tablet Take 81 mg by mouth daily.  . cetirizine (ZYRTEC) 10 MG tablet Take 10 mg by mouth daily as needed for allergies.  Marland Kitchen ezetimibe (ZETIA) 10 MG tablet Take 10 mg by mouth daily.  . nitroGLYCERIN (NITROSTAT) 0.4 MG SL tablet Place 1 tablet (0.4 mg total) under the tongue every 5 (five) minutes as needed for chest pain.  . pravastatin (PRAVACHOL) 20 MG tablet Take 0.5 tablets (10 mg total) by mouth 3 (three) times daily. Take half a tablet by mouth 3 days a week    Review of Systems  Constitutional: Negative for chills, fatigue and fever.  Respiratory: Negative for cough, chest tightness, shortness of breath and wheezing.   Cardiovascular: Negative for chest pain, palpitations and leg swelling.  Musculoskeletal: Negative for arthralgias and myalgias.   Neurological: Positive for dizziness (occasional; rare with standing quickly) and tremors. Negative for weakness, light-headedness, numbness and headaches.    Objective:  BP 96/60 (BP Location: Left Arm, Patient Position: Sitting, Cuff Size: Large)   Pulse 82   Temp (!) 96.6 F (35.9 C) (Temporal)   Ht 5\' 10"  (1.778 m)   Wt 187 lb 3.2 oz (84.9 kg)   SpO2 96%   BMI 26.86 kg/m   Weight: 187 lb 3.2 oz (84.9 kg)   BP Readings from Last 3 Encounters:  02/20/20 96/60  01/31/20 118/82  11/13/19 110/80   Wt Readings from Last 3 Encounters:  02/20/20 187 lb 3.2 oz (84.9 kg)  01/31/20 187 lb (84.8 kg)  11/13/19 186 lb 1.6 oz (84.4 kg)    Physical Exam Constitutional:      General: He is not in acute distress.    Appearance: He is well-developed. He is not diaphoretic.  HENT:     Head: Normocephalic and atraumatic.     Right Ear: External ear normal.     Left Ear: External ear normal.     Nose: Nose normal.     Mouth/Throat:     Pharynx: No oropharyngeal exudate.  Eyes:     Conjunctiva/sclera: Conjunctivae normal.     Pupils: Pupils are equal, round, and reactive to light.  Neck:     Thyroid: No thyromegaly.  Cardiovascular:     Rate and  Rhythm: Normal rate and regular rhythm.     Heart sounds: Normal heart sounds. No murmur. No friction rub. No gallop.   Pulmonary:     Effort: Pulmonary effort is normal. No respiratory distress.     Breath sounds: Normal breath sounds. No stridor. No wheezing or rales.  Abdominal:     General: Bowel sounds are normal.     Palpations: Abdomen is soft.  Musculoskeletal:        General: Normal range of motion.     Cervical back: Neck supple.  Lymphadenopathy:     Cervical: No cervical adenopathy.  Skin:    General: Skin is warm and dry.  Neurological:     Mental Status: He is alert and oriented to person, place, and time.     Cranial Nerves: No cranial nerve deficit.     Motor: No weakness or abnormal muscle tone.     Deep Tendon  Reflexes: Reflexes normal.     Reflex Scores:      Tricep reflexes are 2+ on the right side and 2+ on the left side.      Bicep reflexes are 2+ on the right side and 2+ on the left side.      Brachioradialis reflexes are 2+ on the right side and 2+ on the left side.      Patellar reflexes are 2+ on the right side and 2+ on the left side. Psychiatric:        Behavior: Behavior normal.        Thought Content: Thought content normal.        Judgment: Judgment normal.     Assessment/Plan  1. Tremor Started around time of B12 injections; wondering if related. Not taking other meds except for ASA. Consider additional evaluation pending results, but exam is reassuring.  - CBC with Differential/Platelet; Future - Comprehensive metabolic panel; Future - TSH; Future - Ammonia; Future  2. B12 deficiency See above. Hold off on next injection until this bloodwork completed. - Vitamin B12; Future - Folate; Future    Return for pending results.    Micheline Rough, MD

## 2020-02-20 NOTE — Addendum Note (Signed)
Addended by: Suzette Battiest on: 02/20/2020 09:47 AM   Modules accepted: Orders

## 2020-02-21 ENCOUNTER — Encounter: Payer: Self-pay | Admitting: Family Medicine

## 2020-02-25 ENCOUNTER — Ambulatory Visit: Payer: Medicare Other

## 2020-03-19 ENCOUNTER — Ambulatory Visit: Payer: Medicare Other | Admitting: Family Medicine

## 2020-03-21 ENCOUNTER — Ambulatory Visit: Payer: Medicare Other | Admitting: Cardiovascular Disease

## 2020-04-04 ENCOUNTER — Encounter: Payer: Self-pay | Admitting: Family Medicine

## 2020-04-04 ENCOUNTER — Telehealth (INDEPENDENT_AMBULATORY_CARE_PROVIDER_SITE_OTHER): Payer: Medicare Other | Admitting: Family Medicine

## 2020-04-04 VITALS — Ht 70.0 in

## 2020-04-04 DIAGNOSIS — E538 Deficiency of other specified B group vitamins: Secondary | ICD-10-CM | POA: Diagnosis not present

## 2020-04-04 DIAGNOSIS — I779 Disorder of arteries and arterioles, unspecified: Secondary | ICD-10-CM

## 2020-04-04 DIAGNOSIS — E7800 Pure hypercholesterolemia, unspecified: Secondary | ICD-10-CM | POA: Diagnosis not present

## 2020-04-04 DIAGNOSIS — Z0000 Encounter for general adult medical examination without abnormal findings: Secondary | ICD-10-CM

## 2020-04-04 DIAGNOSIS — R7989 Other specified abnormal findings of blood chemistry: Secondary | ICD-10-CM | POA: Diagnosis not present

## 2020-04-04 NOTE — Progress Notes (Signed)
Virtual Visit via Video Note  I connected with Ronnie Vazquez on 04/04/20 at 10:30 AM EDT by a video enabled telemedicine application and verified that I am speaking with the correct person using two identifiers.  Location patient: home Location provider:work office Persons participating in the virtual visit: patient, provider  I discussed the limitations of evaluation and management by telemedicine and the availability of in person appointments. The patient expressed understanding and agreed to proceed.    Patient: Ronnie Vazquez, Male    DOB: [birth year], 74 y.o.   MRN: [id number] Visit Date: 04/04/2020  Today's Provider: Micheline Rough, MD   Chief Complaint  Patient presents with  . Annual Exam   Subjective:   Initial preventative physical exam  HPI:   Ronnie Vazquez  is a 74 y.o. male who presents today for his  AWV. He feels well. He reports exercising daily - usually an hour/day. He reports he is sleeping well.  Last visit we discussed tremor in hand - seemed to start around time of B12 injections so we had him hold off on injection: sensation has stopped.  Living arrangements - the patient lives with their spouse.  Hearing screen: has hearing aids which he got last after had ringing in ears; hearing aids did help with this.   Vision screen: had eye exam in last couple of years. 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   Advance Directives/Living Will: has daughter as HCPOA but does not have living will or advanced directives  Review of Systems  Constitutional: Negative for chills, fatigue and fever.  Respiratory: Negative for cough, chest tightness, shortness of breath and wheezing.   Cardiovascular: Negative for chest pain, palpitations and leg swelling.    Social History   Socioeconomic History  . Marital status: Married    Spouse name: Not on file  . Number of children: Not on file  . Years of education: Not on file  . Highest education  level: Not on file  Occupational History  . Not on file  Tobacco Use  . Smoking status: Never Smoker  . Smokeless tobacco: Never Used  Substance and Sexual Activity  . Alcohol use: Yes    Alcohol/week: 2.0 standard drinks    Types: 2 Glasses of wine per week  . Drug use: No  . Sexual activity: Not on file  Other Topics Concern  . 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 Difficulty of Paying Living Expenses:   Food Insecurity:   . Worried About Charity fundraiser in the Last Year:   . Arboriculturist in the Last Year:   Transportation Needs:   . Film/video editor (Medical):   Marland Kitchen Lack of Transportation (Non-Medical):   Physical Activity:   . Days of Exercise per Week:   . Minutes of Exercise per Session:   Stress:   . Feeling of Stress :   Social Connections:   . Frequency of Communication with Friends and Family:   . Frequency of Social Gatherings with Friends and Family:   . Attends  Religious Services:   . Active Member of Clubs or Organizations:   . Attends Archivist Meetings:   Marland Kitchen Marital Status:   Intimate Partner Violence:   . Fear of Current or Ex-Partner:   . Emotionally Abused:   Marland Kitchen Physically Abused:   . Sexually Abused:     Patient Active Problem List   Diagnosis Date Noted  . B12 deficiency 11/14/2019  . Coronary artery disease with exertional angina ([place]) 08/15/2018  . Carotid artery disease ([place]) 04/05/2016  . TIA (transient ischemic attack) 12/01/2015  . Hyperlipemia 02/02/2013  . Microscopic hematuria, evaluated by urology 02/2013 02/02/2013    Past Surgical History:  Procedure Laterality Date  . COLONOSCOPY    . CORONARY BALLOON ANGIOPLASTY N/A 08/29/2018   Procedure: CORONARY  BALLOON ANGIOPLASTY;  Surgeon: Sherren Mocha, MD;  Location: [facility];  Service: Cardiovascular;  Laterality: N/A;  . CORONARY STENT INTERVENTION N/A 08/29/2018   Procedure: CORONARY STENT INTERVENTION;  Surgeon: Sherren Mocha, MD;  Location: [facility];  Service: Cardiovascular;  Laterality: N/A;  . LEFT HEART CATH AND CORONARY ANGIOGRAPHY N/A 08/08/2018   Procedure: LEFT HEART CATH AND CORONARY ANGIOGRAPHY;  Surgeon: Lorretta Harp, MD;  Location: [facility];  Service: Cardiovascular;  Laterality: N/A;  . [place]    His family history includes Arthritis in his mother; Arthritis/Rheumatoid in his brother; Prostate cancer in his father.     Previous Medications   ACETAMINOPHEN (TYLENOL) 325 MG TABLET    Take 650 mg by mouth at bedtime as needed (for sleep).    ASPIRIN 81 MG TABLET    Take 81 mg by mouth daily.   EZETIMIBE (ZETIA) 10 MG TABLET    Take 10 mg by mouth daily.   NITROGLYCERIN (NITROSTAT) 0.4 MG SL TABLET    Place 1 tablet (0.4 mg total) under the tongue every 5 (five) minutes as needed for chest pain.   PRAVASTATIN (PRAVACHOL) 20 MG TABLET    Take 0.5 tablets (10 mg total) by mouth 3 (three) times daily. Take half a tablet by mouth 3 days a week    Patient Care Team: Caren Macadam, MD as PCP - General (Family Medicine) Sherren Mocha, MD as PCP - Cardiology (Cardiology)      Objective:   Vitals: Ht 5\' 10"  (1.778 m)   BMI 26.86 kg/m   Physical Exam  VITALS per patient if applicable:NA  GENERAL: alert, oriented, appears well and in no acute distress  HEENT: atraumatic, conjunctiva clear, no obvious abnormalities on inspection of external nose and ears  NECK: normal movements of the head and neck  LUNGS: on inspection no signs of respiratory distress, breathing rate appears normal, no obvious gross SOB, gasping or wheezing  CV: no obvious cyanosis  MS: moves all visible extremities without  noticeable abnormality  PSYCH/NEURO: pleasant and cooperative, no obvious depression or anxiety, speech and thought processing grossly intact  Skin: no noted abnormalities of face, neck   Activities of Daily Living In your present state of health, do you have any difficulty performing the following activities: 04/04/2020  Hearing? N  Vision? N  Difficulty concentrating or making decisions? N  Walking or climbing stairs? N  Dressing or bathing? N  Doing errands, shopping? N  Some recent data might be hidden    Fall Risk Assessment Fall Risk  04/04/2020 11/21/2019 11/28/2018 10/17/2018 08/12/2017  Falls in the past year? 0 0 0 No No  Comment -  Emmi Telephone Survey: data to providers prior to load - - -  Number falls in past yr: 0 - 0 - -  Injury with Fall? 0 - 0 - -  Risk for fall due to : - - - Impaired vision -  Risk for fall due to: Comment - - - Vision loss (left eye)  -     Patient reports there are not safety devices in place in shower at home.   Depression Screen PHQ 2/9 Scores 04/04/2020 02/02/2019 11/28/2018 11/28/2018  PHQ - 2 Score 0 0 0 0  PHQ- 9 Score - - 0 -    Cognitive Testing - 6-CIT   Correct? Score   What year is it? yes 0 Yes = 0    No = 4  What month is it? yes 0 Yes = 0    No = 3  Remember:     [address]ia [address], [place], [place]     What time is it? yes 0 Yes = 0    No = 3  Count backwards from 20 to 1 yes 0 Correct = 0    1 error = 2   More than 1 error = 4  Say the months of the year in reverse. yes 0 Correct = 0    1 error = 2   More than 1 error = 4  What address did I ask you to remember? yes 0 Correct = 0  1 error = 2    2 error = 4    3 error = 6    4 error = 8    All wrong = 10       TOTAL SCORE  0/28   Interpretation:  Normal  Normal (0-7) Abnormal (8-28)    Health Maintenance  Topic Date Due  . TETANUS/TDAP  05/28/2019  . COLONOSCOPY  11/24/2020  . Hepatitis C Screening  Completed  . PNA vac Low Risk Adult  Completed  . INFLUENZA VACCINE   Discontinued    Assessment & Plan:     Initial Preventative Physical Exam  Reviewed patient's Family Medical History Reviewed and updated list of patient's medical providers Assessment of cognitive impairment was done Assessed patient's functional ability Established a written schedule for health screening [place] Completed and Reviewed  Exercise Activities and Dietary recommendations Goals   Continue with current regular exercise program     Immunization History  Administered Date(s) Administered  . Influenza, High Dose Seasonal PF 12/01/2015, 10/27/2019  . PFIZER SARS-COV-2 Vaccination 02/19/2020, 03/11/2020  . Pneumococcal Conjugate-13 12/01/2015  . Pneumococcal Polysaccharide-23 08/12/2017  . Tdap 05/27/2009  . Zoster 08/24/2013    Health Maintenance  Topic Date Due  . Samul Dada  05/28/2019  . COLONOSCOPY  11/24/2020  . Hepatitis C Screening  Completed  . PNA vac Low Risk Adult  Completed  . INFLUENZA VACCINE  Discontinued     Discussed health benefits of physical activity, and encouraged him to engage in regular exercise appropriate for his age and condition.    ------------------------------------------------------------------------------------------------------------  1. Encounter for annual wellness visit (AWV) in Medicare patient He will complete Shingles and Tdap at pharmacy.  Keep up with healthy lifestyle. Will get him a copy of living will/advance directives packet to review.  2. B12 deficiency Change to twice weekly 1041mcg sublingual to help maintain levels. Has felt better without injections. - Vitamin B12; Future  3. Pure hypercholesterolemia Continue w current statin - CBC with Differential/Platelet; Future -  Comprehensive metabolic panel; Future - Lipid panel; Future  4. Bilateral carotid artery disease, unspecified type ([place]) Cholesterol and blood pressure control; follows with cardiology  5. Increased  ammonia level Very low elevation; not concerned but will repeat for stability with next bloodwork. - Ammonia; Future  I discussed the assessment and treatment plan with the patient. The patient was provided an opportunity to ask questions and all were answered. The patient agreed with the plan and demonstrated an understanding of the instructions.   The patient was advised to call back or seek an in-person evaluation if the symptoms worsen or if the condition fails to improve as anticipated.  I provided 30 minutes of non-face-to-face time during this encounter.   Micheline Rough, MD

## 2020-04-08 ENCOUNTER — Telehealth: Payer: Self-pay | Admitting: *Deleted

## 2020-04-08 NOTE — Telephone Encounter (Signed)
Spoke with the pts wife and scheduled appts as below.

## 2020-04-08 NOTE — Telephone Encounter (Signed)
-----   Message from Caren Macadam, MD sent at 04/04/2020 12:34 PM EDT ----- Please schedule patient (and spouse) for lab visit followed by [facility] in 6 months

## 2020-05-06 ENCOUNTER — Other Ambulatory Visit: Payer: Self-pay | Admitting: Cardiovascular Disease

## 2020-06-23 ENCOUNTER — Other Ambulatory Visit: Payer: Self-pay

## 2020-06-24 ENCOUNTER — Encounter: Payer: Self-pay | Admitting: Family Medicine

## 2020-06-24 ENCOUNTER — Ambulatory Visit (INDEPENDENT_AMBULATORY_CARE_PROVIDER_SITE_OTHER): Payer: Medicare Other | Admitting: Family Medicine

## 2020-06-24 VITALS — BP 118/67 | HR 74 | Temp 97.8°F | Wt 186.0 lb

## 2020-06-24 DIAGNOSIS — M545 Low back pain, unspecified: Secondary | ICD-10-CM

## 2020-06-24 DIAGNOSIS — I2510 Atherosclerotic heart disease of native coronary artery without angina pectoris: Secondary | ICD-10-CM | POA: Diagnosis not present

## 2020-06-24 DIAGNOSIS — K219 Gastro-esophageal reflux disease without esophagitis: Secondary | ICD-10-CM

## 2020-06-24 MED ORDER — PANTOPRAZOLE SODIUM 40 MG PO TBEC: 40.0000 mg | DELAYED_RELEASE_TABLET | Freq: Every day | ORAL | 0 refills | Status: DC

## 2020-06-24 NOTE — Progress Notes (Signed)
   Subjective:    Patient ID: Ronnie Vazquez, male    DOB: Oct 14, 1946, 74 y.o.   MRN: [id number]  HPI Here for several issues. First about 19 days ago he swallowed a bite of spring roll that was very hot, and he immediately felt a burning pain in the chest. This pain subsided about 10 minutes later but ever since he has the sensation of something sitting in his esophagus. Food and liquids go down easily. No cough or SOB. He also asks about a low back pain that he thinks is due to medications. He has taken multiple statins for his lipids and they have all caused him to have back pain. On each occasion after he stopped taking it, the pain went away in about a week. Finally he was started on Zetia. The back pain returned so he stopped taking this about 5 days ago. He asks if he should see an orthopedist.    Review of Systems  Constitutional: Negative.   Respiratory: Negative.   Cardiovascular: Negative.   Musculoskeletal: Positive for back pain.       Objective:   Physical Exam Constitutional:      Appearance: Normal appearance.  Cardiovascular:     Rate and Rhythm: Normal rate and regular rhythm.     Pulses: Normal pulses.     Heart sounds: Normal heart sounds.  Pulmonary:     Effort: Pulmonary effort is normal.     Breath sounds: Normal breath sounds.  Musculoskeletal:        General: No tenderness or deformity.  Neurological:     Mental Status: He is alert.           Assessment & Plan:  The sensation in his esophagus is likely the result of GERD. He will take Protonix 40 mg daily for a few weeks to see if this resolves. For the back pain, I advised him to wait another week to see if it goes away. If not, he can contact Dr. Ethlyn Gallery about an orthopedic referral.  Alysia Penna, MD

## 2020-06-26 ENCOUNTER — Encounter: Payer: Self-pay | Admitting: Family Medicine

## 2020-06-26 DIAGNOSIS — M545 Low back pain, unspecified: Secondary | ICD-10-CM

## 2020-07-17 ENCOUNTER — Other Ambulatory Visit: Payer: Self-pay | Admitting: Family Medicine

## 2020-07-18 DIAGNOSIS — S335XXD Sprain of ligaments of lumbar spine, subsequent encounter: Secondary | ICD-10-CM | POA: Diagnosis not present

## 2020-07-22 ENCOUNTER — Telehealth: Payer: Self-pay | Admitting: Cardiovascular Disease

## 2020-07-22 NOTE — Telephone Encounter (Signed)
Left message for patient to call back  

## 2020-07-22 NOTE — Telephone Encounter (Signed)
Patient wanted to know if it's okay for him to take Meloxicam. Patient's ortho doctor started him on it for muscle pain in the middle of his back. Patient stated the ortho doctor stated he needed to check with his cardiologist. Will send message to Dr. Burt Knack and PharmD for advisement.

## 2020-07-22 NOTE — Telephone Encounter (Signed)
Would recommend pt take lowest effective dose for short duration. Long term NSAID use can increase risk of bleeding (he also takes aspirin but is at least on a PPI for GI protection), as well as cardiovascular events (he already has history of CAD s/p stenting).

## 2020-07-22 NOTE — Telephone Encounter (Signed)
Patient is calling to see if it is ok with Dr. Burt Knack that he takes a muscle relaxer that was prescribed by Dr. Mayer Camel at [facility] for his back problems. Please advise.

## 2020-07-23 NOTE — Telephone Encounter (Signed)
Pt advised and verbalized understanding.

## 2020-07-23 NOTE — Telephone Encounter (Signed)
Patient is returning call.  °

## 2020-07-25 DIAGNOSIS — S335XXD Sprain of ligaments of lumbar spine, subsequent encounter: Secondary | ICD-10-CM | POA: Diagnosis not present

## 2020-07-25 NOTE — Telephone Encounter (Signed)
Agree with Megan's recommendations. thanks

## 2020-07-31 ENCOUNTER — Other Ambulatory Visit: Payer: Self-pay | Admitting: Physician Assistant

## 2020-07-31 DIAGNOSIS — L570 Actinic keratosis: Secondary | ICD-10-CM | POA: Diagnosis not present

## 2020-07-31 DIAGNOSIS — H903 Sensorineural hearing loss, bilateral: Secondary | ICD-10-CM | POA: Diagnosis not present

## 2020-07-31 DIAGNOSIS — L578 Other skin changes due to chronic exposure to nonionizing radiation: Secondary | ICD-10-CM | POA: Diagnosis not present

## 2020-07-31 DIAGNOSIS — H9313 Tinnitus, bilateral: Secondary | ICD-10-CM | POA: Diagnosis not present

## 2020-07-31 DIAGNOSIS — L821 Other seborrheic keratosis: Secondary | ICD-10-CM | POA: Diagnosis not present

## 2020-07-31 DIAGNOSIS — L814 Other melanin hyperpigmentation: Secondary | ICD-10-CM | POA: Diagnosis not present

## 2020-07-31 DIAGNOSIS — K219 Gastro-esophageal reflux disease without esophagitis: Secondary | ICD-10-CM

## 2020-07-31 DIAGNOSIS — B353 Tinea pedis: Secondary | ICD-10-CM | POA: Diagnosis not present

## 2020-07-31 DIAGNOSIS — D1801 Hemangioma of skin and subcutaneous tissue: Secondary | ICD-10-CM | POA: Diagnosis not present

## 2020-07-31 DIAGNOSIS — D225 Melanocytic nevi of trunk: Secondary | ICD-10-CM | POA: Diagnosis not present

## 2020-07-31 DIAGNOSIS — Z974 Presence of external hearing-aid: Secondary | ICD-10-CM | POA: Diagnosis not present

## 2020-07-31 DIAGNOSIS — L081 Erythrasma: Secondary | ICD-10-CM | POA: Diagnosis not present

## 2020-08-01 DIAGNOSIS — S335XXD Sprain of ligaments of lumbar spine, subsequent encounter: Secondary | ICD-10-CM | POA: Diagnosis not present

## 2020-08-05 ENCOUNTER — Ambulatory Visit
Admission: RE | Admit: 2020-08-05 | Discharge: 2020-08-05 | Disposition: A | Discharge status: Home / Self Care | Payer: Medicare Other | Source: Ambulatory Visit | Attending: Physician Assistant | Admitting: Physician Assistant

## 2020-08-05 DIAGNOSIS — K224 Dyskinesia of esophagus: Secondary | ICD-10-CM | POA: Diagnosis not present

## 2020-08-05 DIAGNOSIS — K219 Gastro-esophageal reflux disease without esophagitis: Secondary | ICD-10-CM

## 2020-08-08 ENCOUNTER — Encounter: Payer: Self-pay | Admitting: Family Medicine

## 2020-08-08 ENCOUNTER — Ambulatory Visit (INDEPENDENT_AMBULATORY_CARE_PROVIDER_SITE_OTHER): Payer: Medicare Other | Admitting: Family Medicine

## 2020-08-08 ENCOUNTER — Other Ambulatory Visit: Payer: Self-pay

## 2020-08-08 VITALS — BP 96/62 | HR 76 | Temp 97.6°F | Wt 183.0 lb

## 2020-08-08 DIAGNOSIS — K5909 Other constipation: Secondary | ICD-10-CM

## 2020-08-08 DIAGNOSIS — I2510 Atherosclerotic heart disease of native coronary artery without angina pectoris: Secondary | ICD-10-CM | POA: Diagnosis not present

## 2020-08-08 DIAGNOSIS — M19012 Primary osteoarthritis, left shoulder: Secondary | ICD-10-CM | POA: Diagnosis not present

## 2020-08-08 MED ORDER — LACTULOSE 10 GM/15ML PO SOLN: 10.0000 g | Freq: Once | ORAL | 0 refills | Status: AC

## 2020-08-08 MED ORDER — SENNOSIDES-DOCUSATE SODIUM 8.6-50 MG PO TABS: 1.0000 | ORAL_TABLET | Freq: Every day | ORAL | 0 refills | Status: DC | PRN

## 2020-08-08 NOTE — Progress Notes (Signed)
Subjective:    Patient ID: Ronnie Vazquez, male    DOB: 11/02/1946, 74 y.o.   MRN: [id number]  No chief complaint on file.   HPI Pt is a 74 yo male with pmh sig for GERD, h/o TIA, CAD, HLD, h/o microscopic hematuria, and B12 deficiency was seen by Dr. Edilia Bo.  Patient seen today for acute concern.  Pt with constipation after drinking contrast for an upper GI study.  Last BM was Wednesday.  Pt endorses bloating and flatus.  Denies n/v, bleeding.  Pt tried a laxative and senna yesterday without results.  Pt typically has a BM a few times per day.    Past Medical History:  Diagnosis Date  . Chicken pox    hx  . Coronary artery disease    08/29/18 PCI/DES with bifurcation from LAD into 1st diag.   Marland Kitchen Hyperlipidemia   . Left shoulder pain 02/02/2013  . Microscopic hematuria 02/02/2013  . Vision loss of left eye     Allergies  Allergen Reactions  . Repatha [Evolocumab] Other (See Comments)    Myalgias  . Bempedoic Acid   . Crestor [Rosuvastatin Calcium]     ROS General: Denies fever, chills, night sweats, changes in weight, changes in appetite HEENT: Denies headaches, ear pain, changes in vision, rhinorrhea, sore throat CV: Denies CP, palpitations, SOB, orthopnea Pulm: Denies SOB, cough, wheezing GI: Denies abdominal pain, nausea, vomiting, diarrhea  + constipation GU: Denies dysuria, hematuria, frequency Msk: Denies muscle cramps, joint pains Neuro: Denies weakness, numbness, tingling Skin: Denies rashes, bruising Psych: Denies depression, anxiety, hallucinations     Objective:    Blood pressure 96/62, pulse 76, temperature 97.6 F (36.4 C), temperature source Oral, weight 183 lb (83 kg), SpO2 96 %.  Gen. Pleasant, well-nourished, in no distress, normal affect  HEENT: [place]/AT, face symmetric, conjunctiva clear, no scleral icterus, PERRLA, EOMI, nares patent without drainage Lungs: no accessory muscle use, CTAB, no wheezes or rales Cardiovascular: RRR, no m/r/g, no peripheral  edema Abdomen: BS present, soft, NT, mild distention, stool appreciated on palpation of abdomen.  No hepatosplenomegaly. Neuro:  A&Ox3, CN II-XII intact, normal gait Skin:  Warm, no lesions/ rash   Wt Readings from Last 3 Encounters:  08/08/20 183 lb (83 kg)  06/24/20 186 lb (84.4 kg)  02/20/20 187 lb 3.2 oz (84.9 kg)    Lab Results  Component Value Date   WBC 5.8 02/20/2020   HGB 15.3 02/20/2020   HCT 44.8 02/20/2020   PLT 213.0 02/20/2020   GLUCOSE 104 (H) 02/20/2020   CHOL 160 10/15/2019   TRIG 113 10/15/2019   HDL 49 10/15/2019   LDLDIRECT 117.0 08/12/2017   LDLCALC 91 10/15/2019   ALT 29 02/20/2020   AST 24 02/20/2020   NA 137 02/20/2020   K 4.7 02/20/2020   CL 105 02/20/2020   CREATININE 1.34 02/20/2020   BUN 33 (H) 02/20/2020   CO2 22 02/20/2020   TSH 2.03 02/20/2020   PSA 1.73 08/12/2017   INR 1.05 08/10/2018   HGBA1C 5.4 08/10/2018    Assessment/Plan:  Other constipation  -2/2 oral contrast -pt encouraged to continue increasing intake of water and fluids as well as fiber -Discussed one-time dose of lactulose -Also discussed using Colace and senna as needed or Miralax OTC. -Given handout -Given precautions for continued or worsening symptoms - Plan: lactulose (CHRONULAC) 10 GM/15ML solution, senna-docusate (SENOKOT-S) 8.6-50 MG tablet  F/u prn  Grier Mitts, MD

## 2020-08-08 NOTE — Patient Instructions (Signed)

## 2020-08-11 ENCOUNTER — Encounter: Payer: Self-pay | Admitting: Family Medicine

## 2020-08-11 ENCOUNTER — Telehealth: Payer: Self-pay | Admitting: Family Medicine

## 2020-08-11 NOTE — Telephone Encounter (Signed)
Pt called to see if it's okay to take  Miralax.  Please call pt at [phone]

## 2020-08-11 NOTE — Telephone Encounter (Signed)
Patient is wanting to know if it is okay for him to take Miralax at the same time with his other medications. He would like a call back today to let him know what he can do, because it hurts if he don't take it and trying to lay an egg is no picnic.  Please advise

## 2020-08-12 ENCOUNTER — Encounter: Payer: Self-pay | Admitting: Physician Assistant

## 2020-08-12 DIAGNOSIS — M545 Low back pain: Secondary | ICD-10-CM | POA: Diagnosis not present

## 2020-08-13 ENCOUNTER — Telehealth: Payer: Self-pay | Admitting: Family Medicine

## 2020-08-13 ENCOUNTER — Other Ambulatory Visit: Payer: Medicare Other

## 2020-08-13 NOTE — Telephone Encounter (Signed)
I left a detailed message with the information below at the pts cell number. 

## 2020-08-13 NOTE — Telephone Encounter (Signed)
Miralax and colace are safe long term. I would recommend these daily until he is having regular soft stools.

## 2020-08-13 NOTE — Telephone Encounter (Signed)
Yes! Miralax is fine to take with other meds! Ok to also take stool softener with it like colace!

## 2020-08-13 NOTE — Telephone Encounter (Signed)
Patient is aware 

## 2020-08-13 NOTE — Telephone Encounter (Signed)
Pt stated he was told by the manufacturer that he should not be taking the med from [place] more than five days but he has been taking it for a week now. He wants to know if he should still be taking it and what he should be doing by the end of the week?    Pt can be reached at [phone]

## 2020-08-13 NOTE — Telephone Encounter (Signed)
Patient saw Dr Volanda Napoleon last week for contstipation.  Patient is still having the issue and was told by the manufacturer of the medication Dr Volanda Napoleon told him to take to only take it one week and the get advise from his dr.  He is inquiring what his next step needs to be.  He was told he would now need to speak to Dr. Ethlyn Gallery.

## 2020-08-19 ENCOUNTER — Other Ambulatory Visit: Payer: Self-pay | Admitting: Family Medicine

## 2020-08-22 ENCOUNTER — Ambulatory Visit (INDEPENDENT_AMBULATORY_CARE_PROVIDER_SITE_OTHER): Payer: Medicare Other | Admitting: Family Medicine

## 2020-08-22 ENCOUNTER — Encounter: Payer: Self-pay | Admitting: Family Medicine

## 2020-08-22 ENCOUNTER — Other Ambulatory Visit: Payer: Self-pay

## 2020-08-22 VITALS — BP 100/60 | HR 77 | Temp 98.0°F | Ht 70.0 in | Wt 183.1 lb

## 2020-08-22 DIAGNOSIS — M1990 Unspecified osteoarthritis, unspecified site: Secondary | ICD-10-CM | POA: Diagnosis not present

## 2020-08-22 DIAGNOSIS — R1310 Dysphagia, unspecified: Secondary | ICD-10-CM | POA: Diagnosis not present

## 2020-08-22 DIAGNOSIS — M5136 Other intervertebral disc degeneration, lumbar region: Secondary | ICD-10-CM | POA: Diagnosis not present

## 2020-08-22 DIAGNOSIS — I499 Cardiac arrhythmia, unspecified: Secondary | ICD-10-CM

## 2020-08-22 MED ORDER — NAPROXEN 500 MG PO TABS: 500.0000 mg | ORAL_TABLET | Freq: Two times a day (BID) | ORAL | 2 refills | Status: DC | PRN

## 2020-08-22 NOTE — Patient Instructions (Signed)
Call cardiology to set up visit with your cardiologist for new onset atrial fib/flutter.

## 2020-08-22 NOTE — Progress Notes (Signed)
Ronnie Vazquez DOB: 13-May-1946 Encounter date: 08/22/2020  This is a 74 y.o. male who presents with Chief Complaint  Patient presents with  . Back Pain    x2 months, no known injury, sudden onset, seen by Dr Mayer Camel, x-rays were normal, DDD noted    History of present illness: Mowing lawn , exercise, playing golf and suddenly got more severe pain in [facility] playing golf. Called neighbor who is retired Psychologist, sport and exercise. Saw ortho, dx with DDD and there was some loss of lumbar lordosis. Non - surgical findings. Feels that this is doing better.   Walks a lot - got really sore to point of getting sore even during night. Great toe last foot. advil helps. Works well for this, but feels like this isn't good for him.   Bit into hot springroll and swallowed hot steam. Felt like something in throat for a few weeks. Had scope through nose. Nothing seen. Then sent to lab to drink barium and this made him constipated. Also flared hemorrhoids. Hasn't needed to take for last few days. Colace and miralax resolved. Still feels like something is in throat. Did not have acid reflux before, but noting some now. Taking tums now to help prevent reflux.   Feels like he still gets some tremor -notes that teeth shake; notes more at night.    Allergies  Allergen Reactions  . Repatha [Evolocumab] Other (See Comments)    Myalgias  . Bempedoic Acid   . Crestor [Rosuvastatin Calcium]    Current Meds  Medication Sig  . aspirin 81 MG tablet Take 81 mg by mouth daily.  Marland Kitchen ezetimibe (ZETIA) 10 MG tablet TAKE 1 TABLET BY MOUTH EVERY DAY  . nitroGLYCERIN (NITROSTAT) 0.4 MG SL tablet Place 1 tablet (0.4 mg total) under the tongue every 5 (five) minutes as needed for chest pain.  . pantoprazole (PROTONIX) 40 MG tablet TAKE 1 TABLET BY MOUTH EVERY DAY  . pravastatin (PRAVACHOL) 20 MG tablet Take 0.5 tablets (10 mg total) by mouth 3 (three) times daily. Take half a tablet by mouth 3 days a week  . senna-docusate (SENOKOT-S)  8.6-50 MG tablet Take 1 tablet by mouth daily as needed for mild constipation.  . [DISCONTINUED] acetaminophen (TYLENOL) 325 MG tablet Take 650 mg by mouth at bedtime as needed (for sleep).   . [DISCONTINUED] meloxicam (MOBIC) 15 MG tablet Take 15 mg by mouth daily.    Review of Systems  Constitutional: Negative for chills, fatigue and fever.  Respiratory: Negative for cough, chest tightness, shortness of breath and wheezing.   Cardiovascular: Negative for chest pain, palpitations and leg swelling.  Gastrointestinal: Negative for abdominal pain, nausea and vomiting. Anal bleeding: hemorrhoids flared while constipated. Constipation: has resolved.       Food sticking, not liquids. Has continuous sensation of something in throat since swallowing hot spring roll  Musculoskeletal: Back pain: doing better now.    Objective:  BP 100/60 (BP Location: Left Arm, Patient Position: Sitting, Cuff Size: Normal)   Pulse 77   Temp 98 F (36.7 C) (Oral)   Ht 5\' 10"  (1.778 m)   Wt 183 lb 1.6 oz (83.1 kg)   BMI 26.27 kg/m   Weight: 183 lb 1.6 oz (83.1 kg)   BP Readings from Last 3 Encounters:  08/22/20 100/60  08/08/20 96/62  06/24/20 118/67   Wt Readings from Last 3 Encounters:  08/22/20 183 lb 1.6 oz (83.1 kg)  08/08/20 183 lb (83 kg)  06/24/20 186 lb (84.4 kg)  Physical Exam Constitutional:      General: He is not in acute distress.    Appearance: He is well-developed.  Cardiovascular:     Rate and Rhythm: Normal rate. Rhythm irregularly irregular.     Heart sounds: Normal heart sounds. No murmur heard.  No friction rub.  Pulmonary:     Effort: Pulmonary effort is normal. No respiratory distress.     Breath sounds: Normal breath sounds. No wheezing or rales.  Musculoskeletal:     Right lower leg: No edema.     Left lower leg: No edema.  Neurological:     Mental Status: He is alert and oriented to person, place, and time.  Psychiatric:        Behavior: Behavior normal.      Assessment/Plan  1. Difficulty swallowing solids Has been referred to GI; I did send message to Dr. Fuller Plan today as heads up. We discussed EGD procedure in office and importance of taking protonix as prescribed due to reflux noted on barium imaging. Discussed this may actually help symptoms. We discussed limiting anti-inflammatories which can aggravate stomache/reflux.  2. DDD (degenerative disc disease), lumbar Improved. He is staying active, back at golf.   3. Arthritis Naproxen IF needed. Use sparingly due to risk factors.   4. Irregular heart beat Noted atrial flutter on ekg today. He will schedule follow up with cardiology. He is currently on asa. He is unaware of how long he has been in abnormal rhythm and is rate controlled and asymptomatic.    EKG: atrial flutter. Rate controlled. Previous EKG were normal sinus rhythm.  - EKG 12-Lead  Return in about 3 months (around 11/22/2020) for Chronic condition visit. Time with patient; reviewing new diagnoses, reviewing EKG charting, chart review over 45 minutes.   Micheline Rough, MD

## 2020-08-24 ENCOUNTER — Other Ambulatory Visit: Payer: Self-pay | Admitting: Family Medicine

## 2020-08-25 ENCOUNTER — Telehealth: Payer: Self-pay

## 2020-08-25 NOTE — Telephone Encounter (Signed)
Ronnie Artist, MD sent to Ronnie Macadam, MD; Ronnie Pel, RN Hi, We will contact him on Monday and assess his symptoms. An esophageal mucosal injury from hot food should resolve on its own but if persistent EGD is warranted. Esophageal dysmotility and GERD noted on esophogram. Thanks, MS     Patient contacted.  Patient reports that a few months ago was eating a hot spring role and has had difficulty swallowing since.  He reports that he is having pill and solid food dysphagia.  He has a globus sensation after meals that can last for a few hours.  He reports that he has a new diagnosis for A-fib found on recent EKG and sees cardiology this week to discuss .  Patient is scheduled for pre-visit on 08/29/20 and EGD on 09/09/20.  Ronnie Vazquez is it okay to proceed with EGD.  He will discuss tentative plans with cardiology tomorrow.  He understands that if cardiology feels anticoagulation is needed urgently, will need to cancel EGD.

## 2020-08-25 NOTE — Telephone Encounter (Signed)
Await Cardiology evaluation, plans. EGD may need to be canceled pending Cardiology plans.

## 2020-08-26 ENCOUNTER — Encounter: Payer: Self-pay | Admitting: Physician Assistant

## 2020-08-26 ENCOUNTER — Ambulatory Visit (INDEPENDENT_AMBULATORY_CARE_PROVIDER_SITE_OTHER): Payer: Medicare Other | Admitting: Physician Assistant

## 2020-08-26 ENCOUNTER — Other Ambulatory Visit: Payer: Self-pay

## 2020-08-26 VITALS — BP 102/60 | HR 87 | Ht 70.0 in | Wt 184.0 lb

## 2020-08-26 DIAGNOSIS — S335XXD Sprain of ligaments of lumbar spine, subsequent encounter: Secondary | ICD-10-CM | POA: Diagnosis not present

## 2020-08-26 DIAGNOSIS — E7800 Pure hypercholesterolemia, unspecified: Secondary | ICD-10-CM

## 2020-08-26 DIAGNOSIS — I2510 Atherosclerotic heart disease of native coronary artery without angina pectoris: Secondary | ICD-10-CM

## 2020-08-26 DIAGNOSIS — I483 Typical atrial flutter: Secondary | ICD-10-CM

## 2020-08-26 MED ORDER — METOPROLOL SUCCINATE ER 25 MG PO TB24
12.5000 mg | ORAL_TABLET | Freq: Every day | ORAL | 1 refills | Status: DC
Start: 2020-08-26 — End: 2021-01-16

## 2020-08-26 MED ORDER — APIXABAN 5 MG PO TABS
5.0000 mg | ORAL_TABLET | Freq: Two times a day (BID) | ORAL | 6 refills | Status: DC
Start: 2020-08-26 — End: 2021-01-16

## 2020-08-26 NOTE — Telephone Encounter (Signed)
Yes ok to proceed with EGD

## 2020-08-26 NOTE — Telephone Encounter (Signed)
Dr. Fuller Plan see cardiology notes from today.  Patient being started on Eliquis, but permission to hold for 2 days for endoscopic procedure is noted.  Okay to proceed with EGD?

## 2020-08-26 NOTE — Progress Notes (Signed)
Cardiology Office Note:    Date:  08/26/2020   ID:  Ronnie Vazquez, DOB [birth year], MRN [id number]  PCP:  Ronnie Macadam, MD  [facility] Cardiologist:  Ronnie Mocha, MD   [place] Electrophysiologist:  None   Referring MD: Ronnie Macadam, MD   Chief Complaint:  No chief complaint on file.    Patient Profile:    Ronnie Vazquez is a 74 y.o. male with:   Coronary artery disease  S/p complex bifurcational PCI with DES to the pLAD and POBA to the D1 (thru stent struts) 08/2018  Clopidogrel ? to Ticagrelor due to tinnitus (no resolution)  On ASA alone after 6 mos  Atrial Flutter  CHA2DS2-VASc=2 (CAD, age x 1)   Hyperlipidemia   Intol of statins, PCSK9i, Bempedoic Acid  On Ezetimibe   Tinnitus   ?? Hx of TIA in 2014 >> visual disturbance; eval by Neuro >> No TIA  Carotid Artery Dz   [place] 8/19: Bilateral ICA 1-39   Prior CV studies:  Myoview 07/30/2019 EF 60, no ischemia or scar, low risk  Percutaneous coronary intervention 08/29/2018 Successful bifurcation PCI with stenting of the proximal LAD using a 3.0x30 mm Resolute Onyx DES and angioplasty of the first diagonal (through the LAD stent struts) with a 3.0 mm balloon  Pre CABG Dopplers 08/10/18 Bilateral ICA 1-39  Cardiac Catheterization8/13/19  Prox LAD-1 lesion is 90% stenosed.  Prox LAD-2 lesion is 70% stenosed.  The left ventricular systolic function is normal.  LV end diastolic pressure is normal.  The left ventricular ejection fraction is 55-65% by visual estimate.  Nuclear stress test8/9/19 EF 54, +Ant ischemia; High Risk  Echo 08/13/2013 Moderate LVH, EF 65   History of Present Illness:    Ronnie Vazquez was last seen in 01/2020.  He was seen by primary care recently and noted to be in AFlutter.  He returns for further evaluation.  He was not aware that he was in atrial flutter.  He had felt a little fatigued and may be a little off balance recently.  He has not had  significant shortness of breath, chest discomfort, orthopnea, leg swelling or syncope.  He has an endoscopy planned with Ronnie Vazquez Plan for 09/09/2020.      Past Medical History:  Diagnosis Date  . Chicken pox    hx  . Coronary artery disease    08/29/18 PCI/DES with bifurcation from LAD into 1st diag.   Marland Kitchen Hyperlipidemia   . Left shoulder pain 02/02/2013  . Microscopic hematuria 02/02/2013  . Vision loss of left eye     Current Medications: Current Meds  Medication Sig  . aspirin 81 MG tablet Take 81 mg by mouth daily.  Marland Kitchen erythromycin with ethanol (EMGEL) 2 % gel Apply topically 2 (two) times daily.  Marland Kitchen ezetimibe (ZETIA) 10 MG tablet TAKE 1 TABLET BY MOUTH EVERY DAY  . ketoconazole (NIZORAL) 2 % cream Apply topically 2 (two) times daily.  . naproxen (NAPROSYN) 500 MG tablet Take 1 tablet (500 mg total) by mouth 2 (two) times daily as needed.  . nitroGLYCERIN (NITROSTAT) 0.4 MG SL tablet Place 1 tablet (0.4 mg total) under the tongue every 5 (five) minutes as needed for chest pain.  Marland Kitchen senna-docusate (SENOKOT-S) 8.6-50 MG tablet Take 1 tablet by mouth daily as needed for mild constipation.     Allergies:   Repatha [evolocumab], Bempedoic acid, and Crestor [rosuvastatin calcium]   Social History   Tobacco Use  . Smoking status: Never  Smoker  . Smokeless tobacco: Never Used  Vaping Use  . Vaping Use: Never used  Substance Use Topics  . Alcohol use: Yes    Alcohol/week: 2.0 standard drinks    Types: 2 Glasses of wine per week  . Drug use: No     Family Hx: The patient's family history includes Arthritis in his mother; Arthritis/Rheumatoid in his brother; Prostate cancer in his father. There is no history of Colon cancer, Esophageal cancer, Pancreatic cancer, Rectal cancer, Stomach cancer, or Heart disease.  ROS   EKGs/Labs/Other Test Reviewed:    EKG:  EKG is   ordered today.  The ekg ordered today demonstrates typical counterclockwise atrial flutter, HR 96, normal axis  ECG from  primary care dated 08/22/2020 personally reviewed and interpreted:  Typical Counterclockwise AFlutter, HR 107  Recent Labs: 02/20/2020: ALT 29; BUN 33; Creatinine, Ser 1.34; Hemoglobin 15.3; Platelets 213.0; Potassium 4.7; Sodium 137; TSH 2.03   Recent Lipid Panel Lab Results  Component Value Date/Time   CHOL 160 10/15/2019 09:06 AM   TRIG 113 10/15/2019 09:06 AM   HDL 49 10/15/2019 09:06 AM   CHOLHDL 3.3 10/15/2019 09:06 AM   CHOLHDL 4 06/15/2019 08:09 AM   LDLCALC 91 10/15/2019 09:06 AM   LDLDIRECT 117.0 08/12/2017 09:21 AM    Physical Exam:    VS:  BP 102/60   Pulse 87   Ht 5\' 10"  (1.778 m)   Wt 184 lb (83.5 kg)   SpO2 96%   BMI 26.40 kg/m     Wt Readings from Last 3 Encounters:  08/26/20 184 lb (83.5 kg)  08/22/20 183 lb 1.6 oz (83.1 kg)  08/08/20 183 lb (83 kg)     Constitutional:      Appearance: Healthy appearance. Not in distress.  Neck:     Vascular: JVD normal.  Pulmonary:     Effort: Pulmonary effort is normal.     Breath sounds: No wheezing. No rales.  Cardiovascular:     Normal rate. Irregularly irregular rhythm. Normal S1. Normal S2.     Murmurs: There is no murmur.  Edema:    Peripheral edema absent.  Abdominal:     Palpations: Abdomen is soft.  Skin:    General: Skin is warm and dry.  Neurological:     General: No focal deficit present.     Mental Status: Alert and oriented to person, place and time.     Cranial Nerves: Cranial nerves are intact.       ASSESSMENT & PLAN:    1. Typical atrial flutter (HCC) CHA2DS2-VASc=2.  He will need anticoagulation for stroke prevention.  He has had fair control of his heart rate.  Overall, he is fairly asymptomatic.  He may be a good candidate for ablation as he does have typical atrial flutter.  He does have an EGD scheduled on September 14.  He should be able to undergo this procedure without difficulty.  -Start Apixaban 5 mg twice daily  -BMET, CBC in 6 weeks.   -Arrange 2D echocardiogram  -Start  metoprolol succinate 12.5 mg daily for rate control  -Refer to electrophysiology for consideration of A flutter ablation  -He can hold Apixaban x2 days prior to his EGD   -He should resume Apixaban after his EGD when safe from a bleeding perspective  2. Coronary artery disease involving native coronary artery of native heart without angina pectoris History of bifurcational stenting to the LAD/diagonal in September 2019.  He is not having anginal symptoms.  Continue aspirin, ezetimibe.  I will review with Dr. Burt Knack whether or not to stop aspirin as he is now on Apixaban.  3. Hypercholesterolemia He has been intolerant to several medications in the past and has only been able to tolerate ezetimibe.  Continue current therapy.    Dispo:  Return in about 3 months (around 11/25/2020) for Routine Follow Up, w/ Dr. Burt Knack, or Richardson Dopp, PA-C, in person.   Medication Adjustments/Labs and Tests Ordered: Current medicines are reviewed at length with the patient today.  Concerns regarding medicines are outlined above.  Tests Ordered: Orders Placed This Encounter  Procedures  . Basic metabolic panel  . CBC  . Ambulatory referral to Cardiac Electrophysiology  . EKG 12-Lead  . ECHOCARDIOGRAM COMPLETE   Medication Changes: Meds ordered this encounter  Medications  . apixaban (ELIQUIS) 5 MG TABS tablet    Sig: Take 1 tablet (5 mg total) by mouth 2 (two) times daily.    Dispense:  60 tablet    Refill:  6  . metoprolol succinate (TOPROL XL) 25 MG 24 hr tablet    Sig: Take 0.5 tablets (12.5 mg total) by mouth daily.    Dispense:  45 tablet    Refill:  1    Signed, Richardson Dopp, PA-C  08/26/2020 4:37 PM    [facility] [address], [place], [place]  [address] Phone: [phone]; Fax: [phone]

## 2020-08-26 NOTE — Patient Instructions (Addendum)
Medication Instructions:  Your physician has recommended you make the following change in your medication:   1) Start Eliquis 5 mg, 1 tablet by mouth twice a day 2) Start Metoprolol Succinate 25 mg, 0.5 tablet by mouth once a day  *If you need a refill on your cardiac medications before your next appointment, please call your pharmacy*  Lab Work: Your physician recommends that you return for lab work in: 6 weeks for BMET/CBC  Testing/Procedures: Your physician has requested that you have an echocardiogram. Echocardiography is a painless test that uses sound waves to create images of your heart. It provides your doctor with information about the size and shape of your heart and how well your heart's chambers and valves are working. This procedure takes approximately one hour. There are no restrictions for this procedure.  Follow-Up: At [facility], you and your health needs are our priority.  As part of our continuing mission to provide you with exceptional heart care, we have created designated Provider Care Teams.  These Care Teams include your primary Cardiologist (physician) and Advanced Practice Providers (APPs -  Physician Assistants and Nurse Practitioners) who all work together to provide you with the care you need, when you need it.  Your next appointment:   3 month(s)  The format for your next appointment:   In Person  Provider:   You may see Sherren Mocha, MD or Richardson Dopp, PA-C  Other Instructions You may hold Eliquis for 2 days prior to endoscopy and resume when ok with GI.

## 2020-08-27 NOTE — Telephone Encounter (Signed)
Patient's questions answered about diet until EGD.  He will call back for any additional questions or concerns

## 2020-08-27 NOTE — Telephone Encounter (Signed)
Pt is requesting a call back from a nurse, pt states he has more questions for her

## 2020-08-27 NOTE — Telephone Encounter (Signed)
Patient aware .  He will keep the appt for 9/3 and upcoming EGD

## 2020-08-29 ENCOUNTER — Ambulatory Visit (AMBULATORY_SURGERY_CENTER): Payer: Self-pay

## 2020-08-29 ENCOUNTER — Telehealth: Payer: Self-pay | Admitting: Cardiovascular Disease

## 2020-08-29 ENCOUNTER — Other Ambulatory Visit: Payer: Self-pay

## 2020-08-29 VITALS — Ht 70.0 in | Wt 179.0 lb

## 2020-08-29 DIAGNOSIS — R1310 Dysphagia, unspecified: Secondary | ICD-10-CM

## 2020-08-29 NOTE — Progress Notes (Signed)
Yes would continue 

## 2020-08-29 NOTE — Progress Notes (Signed)
No egg or soy allergy known to patient  No issues with past sedation with any surgeries or procedures No intubation problems in the past  No FH of Malignant Hyperthermia No diet pills per patient No home 02 use per patient  No blood thinners per patient  Pt denies issues with constipation however takes stool softener if needed  No A fib or A flutter  EMMI video via [place] 19 guidelines implemented in [place] today with Pt and RN  Coupon given to pt in PV today , Code to Pharmacy  COVID vaccines completed on 02/2020 per pt;  Due to the COVID-19 pandemic we are asking patients to follow these guidelines. Please only bring one care partner. Please be aware that your care partner may wait in the car in the parking lot or if they feel like they will be too hot to wait in the car, they may wait in the lobby on the 4th floor. All care partners are required to wear a mask the entire time (we do not have any that we can provide them), they need to practice social distancing, and we will do a Covid check for all patient's and care partners when you arrive. Also we will check their temperature and your temperature. If the care partner waits in their car they need to stay in the parking lot the entire time and we will call them on their cell phone when the patient is ready for discharge so they can bring the car to the front of the building. Also all patient's will need to wear a mask into building.

## 2020-08-29 NOTE — Telephone Encounter (Signed)
Patient is requesting to speak with Ronnie Vazquez to discuss whether or not he needs to continue holding aspirin 81 MG tablet. Please advise.

## 2020-09-01 NOTE — Telephone Encounter (Signed)
I reviewed with Dr. Burt Knack. He should remain on ASA in addition to Apixaban (Eliquis). Richardson Dopp, PA-C    09/01/2020 4:10 PM

## 2020-09-02 NOTE — Telephone Encounter (Signed)
I called and left patient a message to call back. 

## 2020-09-03 NOTE — Telephone Encounter (Signed)
Patient returned my call, he is aware per Dr. Burt Knack and Richardson Dopp to remain on Aspirin 81 mg in addition to Eliquis.

## 2020-09-03 NOTE — Telephone Encounter (Signed)
I called and left patient a message to call back. 

## 2020-09-03 NOTE — Telephone Encounter (Signed)
Patient returning call.

## 2020-09-05 NOTE — Progress Notes (Signed)
ELECTROPHYSIOLOGY CONSULT NOTE  Patient ID: Ronnie Vazquez, MRN: [id number], DOB/AGE: 1946-09-23 74 y.o. Admit date: (Not on file) Date of Consult: 09/08/2020  Primary Physician: Caren Macadam, MD Primary Cardiologist: *[facility]*     Ronnie Vazquez is a 74 y.o. male who is being seen today for the evaluation of atrial flutter at the request of [facility].    HPI Ronnie Vazquez is a 74 y.o. male with known coronary artery disease who was found on physical examination to have tachycardia with ECG demonstrating atrial flutter  Scant symptoms if at all. Maybe a little bit more breathlessness playing golf or with his trash cans. No edema. No palpitations.  History of coronary artery disease with complex PCI 9/19 treated with aspirin but intolerance of statins and PCSK9 inhibitors  He is scheduled to undergo endoscopy tomorrow for dysphagia. He thinks he "burned his esophagus "  Has stopped the beta-blocker because of lightheadedness. Has chronically relatively low blood pressure  DATE TEST EF   8/14 Echo   65 %   8/19 LHC  55-60* % LADp-90/70>>stent  8/20 Myoview  60% No ischemia   Date Cr K Hgb  2/21 1.34 4.7 15.3          Thromboembolic risk factors ( age -47, TIA/CVA-2 (no clear consensus, Vasc disease -1) for a CHADSVASc Score of >=2-4   Past Medical History:  Diagnosis Date  . Arthritis    toe  . Chicken pox    hx  . Coronary artery disease    08/29/18 PCI/DES with bifurcation from LAD into 1st diag.   Marland Kitchen GERD (gastroesophageal reflux disease)    with certain foods/on OTC meds  . Hyperlipidemia    on meds  . Left shoulder pain 02/02/2013  . Microscopic hematuria 02/02/2013  . Vision loss of left eye       Surgical History:  Past Surgical History:  Procedure Laterality Date  . COLONOSCOPY    . CORONARY BALLOON ANGIOPLASTY N/A 08/29/2018   Procedure: CORONARY BALLOON ANGIOPLASTY;  Surgeon: Sherren Mocha, MD;  Location: [facility];  Service: Cardiovascular;   Laterality: N/A;  . CORONARY STENT INTERVENTION N/A 08/29/2018   Procedure: CORONARY STENT INTERVENTION;  Surgeon: Sherren Mocha, MD;  Location: [facility];  Service: Cardiovascular;  Laterality: N/A;  . LEFT HEART CATH AND CORONARY ANGIOGRAPHY N/A 08/08/2018   Procedure: LEFT HEART CATH AND CORONARY ANGIOGRAPHY;  Surgeon: Lorretta Harp, MD;  Location: [facility];  Service: Cardiovascular;  Laterality: N/A;  . TONSILLECTOMY AND ADENOIDECTOMY  1959  . [place]       . Current Outpatient Medications on File Prior to Visit  Medication Sig Dispense Refill  . apixaban (ELIQUIS) 5 MG TABS tablet Take 1 tablet (5 mg total) by mouth 2 (two) times daily. 60 tablet 6  . aspirin 81 MG tablet Take 81 mg by mouth daily.    Marland Kitchen erythromycin with ethanol (EMGEL) 2 % gel Apply topically 2 (two) times daily.    Marland Kitchen ezetimibe (ZETIA) 10 MG tablet TAKE 1 TABLET BY MOUTH EVERY DAY 90 tablet 3  . ketoconazole (NIZORAL) 2 % cream Apply topically 2 (two) times daily.    . metoprolol succinate (TOPROL XL) 25 MG 24 hr tablet Take 0.5 tablets (12.5 mg total) by mouth daily. 45 tablet 1  . naproxen (NAPROSYN) 500 MG tablet Take 1 tablet (500 mg total) by mouth 2 (two) times daily as  needed. 30 tablet 2  . nitroGLYCERIN (NITROSTAT) 0.4 MG SL tablet Place 1 tablet (0.4 mg total) under the tongue every 5 (five) minutes as needed for chest pain. 25 tablet 3   No current facility-administered medications on file prior to visit.     Allergies:  Allergies  Allergen Reactions  . Repatha [Evolocumab] Other (See Comments)    Myalgias  . Bempedoic Acid   . Crestor [Rosuvastatin Calcium]     Social History   Socioeconomic History  . Marital status: Married    Spouse name: Not on file  . Number of children: Not on file  . Years of education: Not on file  . Highest education level: Not on file  Occupational History  . Not on file  Tobacco Use  . Smoking status: Never Smoker   . Smokeless tobacco: Never Used  Vaping Use  . Vaping Use: Never used  Substance and Sexual Activity  . Alcohol use: Yes    Alcohol/week: 8.0 standard drinks    Types: 8 Glasses of wine per week  . Drug use: No  . Sexual activity: Not on file  Other Topics Concern  . 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 Difficulty of Paying Living Expenses: Not on file  Food Insecurity:   . Worried About Charity fundraiser in the Last Year: Not on file  . Ran Out of Food in the Last Year: Not on file  Transportation Needs:   . Lack of Transportation (Medical): Not on file  . Lack of Transportation (Non-Medical): Not on file  Physical Activity:   . Days of Exercise per Week: Not on file  . Minutes of Exercise per Session: Not on file  Stress:   . Feeling of Stress : Not on file  Social Connections:   . Frequency of Communication with Friends and Family: Not on file  . Frequency of Social Gatherings with Friends and Family: Not on file  . Attends Religious Services: Not on file  . Active Member of Clubs or Organizations: Not on file  . Attends Archivist Meetings: Not on file  . Marital Status: Not on file  Intimate Partner Violence:   . Fear of Current or Ex-Partner: Not on file  . Emotionally Abused: Not on file  . Physically Abused: Not on file  . Sexually Abused: Not on file     Family History  Problem Relation Age of Onset  . Prostate cancer Father   . Arthritis Mother   . Arthritis/Rheumatoid Brother   . Colon cancer Neg Hx   . Esophageal cancer Neg Hx   . Pancreatic cancer Neg Hx   . Rectal cancer Neg Hx   . Stomach cancer Neg Hx   . Heart disease Neg Hx      ROS:  Please see the history of present  illness.     All other systems reviewed and negative.    Physical Exam:  Blood pressure 102/80, pulse (!) 112, height 5\' 10"  (1.778 m), weight 182 lb (82.6 kg), SpO2 96 %. General: Well developed, well nourished male in  no acute distress. Head: Normocephalic, atraumatic, sclera non-icteric, no xanthomas, nares are without discharge. EENT: normal  Lymph Nodes:  none Neck: Negative for carotid bruits. JVD 7 cm Back:without scoliosis kyphosis  Lungs: Clear bilaterally to auscultation without wheezes, rales, or rhonchi. Breathing is unlabored. Heart: Irregularly irregular rate and rhythm murmur . No rubs, or gallops appreciated. Abdomen: Soft, non-tender, non-distended with normoactive bowel sounds. No hepatomegaly. No rebound/guarding. No obvious abdominal masses. Msk:  Strength and tone appear normal for age. Extremities: No clubbing or cyanosis. 1+  edema.  Distal pedal pulses are 2+ and equal bilaterally. Skin: Warm and Dry Neuro: Alert and oriented X 3. CN III-XII intact Grossly normal sensory and motor function . Psych:  Responds to questions appropriately with a normal affect.      Labs: Cardiac Enzymes No results for input(s): CKTOTAL, CKMB, TROPONINI in the last 72 hours. CBC Lab Results  Component Value Date   WBC 8.3 09/08/2020   HGB 16.0 09/08/2020   HCT 46.8 09/08/2020   MCV 97 09/08/2020   PLT 214 09/08/2020   PROTIME: No results for input(s): LABPROT, INR in the last 72 hours. Chemistry  Recent Labs  Lab 09/08/20 1140  NA 139  K 5.0  CL 101  CO2 25  BUN 21  CREATININE 1.35*  CALCIUM 10.1  GLUCOSE 87   Lipids Lab Results  Component Value Date   CHOL 160 10/15/2019   HDL 49 10/15/2019   [place] 91 10/15/2019   TRIG 113 10/15/2019   BNP No results found for: PROBNP Thyroid Function Tests: No results for input(s): TSH, T4TOTAL, T3FREE, THYROIDAB in the last 72 hours.  Invalid input(s): FREET3 Miscellaneous No results found for:  DDIMER  Radiology/Studies:  No results found.  EKG:  Atrial flutter @ 112 8/21 ATrial flutter typical    Assessment and Plan:  Atrial Flutter RVR  CAD with prior LAD PCI  TIA ?  Dysphagia    Patient has persistent atrial flutter-typical.  We have discussed treatment options including cardioversion with his high risk of recurrence and catheter ablation.  Given his CHA2DS2-VASc score of 2--4 (depending on the TIA) risks of the procedure are tenfold less or more than anticoagulation and residual stroke risk for a year.  Hence, have recommended that he undergo catheter ablation.  The timing will related to things.  The first is given the paucity of symptoms and his rapid rates is the risk for cardiomyopathy.  Echocardiogram is scheduled for Friday.  In the event that LV dysfunction is prominent, would recommend that we undertake a accelerated ablation protocol with antecedent TEE if the endoscopy undertaken for his dysphagia tomorrow permits.  He has not been able to tolerate beta-blockers because of low blood pressure  For now we will continue Eliquis.  We will also continue aspirin given his high risk PCI and realizing that the short-term risks of dual therapy is probably low  Will cehck  CBC and BMET  Virl Axe

## 2020-09-06 DIAGNOSIS — I483 Typical atrial flutter: Secondary | ICD-10-CM | POA: Insufficient documentation

## 2020-09-08 ENCOUNTER — Ambulatory Visit (INDEPENDENT_AMBULATORY_CARE_PROVIDER_SITE_OTHER): Payer: Medicare Other | Admitting: Internal Medicine

## 2020-09-08 ENCOUNTER — Other Ambulatory Visit: Payer: Self-pay

## 2020-09-08 ENCOUNTER — Encounter: Payer: Self-pay | Admitting: Internal Medicine

## 2020-09-08 DIAGNOSIS — I483 Typical atrial flutter: Secondary | ICD-10-CM

## 2020-09-08 LAB — CBC
Hematocrit: 46.8 % (ref 37.5–51.0)
Hemoglobin: 16 g/dL (ref 13.0–17.7)
MCH: 33 pg (ref 26.6–33.0)
MCHC: 34.2 g/dL (ref 31.5–35.7)
MCV: 97 fL (ref 79–97)
Platelets: 214 10*3/uL (ref 150–450)
RBC: 4.85 x10E6/uL (ref 4.14–5.80)
RDW: 12.4 % (ref 11.6–15.4)
WBC: 8.3 10*3/uL (ref 3.4–10.8)

## 2020-09-08 LAB — BASIC METABOLIC PANEL
BUN/Creatinine Ratio: 16 (ref 10–24)
BUN: 21 mg/dL (ref 8–27)
CO2: 25 mmol/L (ref 20–29)
Calcium: 10.1 mg/dL (ref 8.6–10.2)
Chloride: 101 mmol/L (ref 96–106)
Creatinine, Ser: 1.35 mg/dL — ABNORMAL HIGH (ref 0.76–1.27)
GFR calc Af Amer: 61 mL/min/{1.73_m2} (ref >59)
GFR calc non Af Amer: 52 mL/min/{1.73_m2} — ABNORMAL LOW (ref >59)
Glucose: 87 mg/dL (ref 65–99)
Potassium: 5 mmol/L (ref 3.5–5.2)
Sodium: 139 mmol/L (ref 134–144)

## 2020-09-08 NOTE — Patient Instructions (Addendum)
Medication Instructions:  Your physician recommends that you continue on your current medications as directed. Please refer to the Current Medication list given to you today. *If you need a refill on your cardiac medications before your next appointment, please call your pharmacy*   Lab Work: CBC and BMET today  If you have labs (blood work) drawn today and your tests are completely normal, you will receive your results only by: Marland Kitchen MyChart Message (if you have MyChart) OR . A paper copy in the mail If you have any lab test that is abnormal or we need to change your treatment, we will call you to review the results.   Testing/Procedures: Your physician has recommended that you have an ablation. Catheter ablation is a medical procedure used to treat some cardiac arrhythmias (irregular heartbeats). During catheter ablation, a long, thin, flexible tube is put into a blood vessel in your groin (upper thigh), or neck. This tube is called an ablation catheter. It is then guided to your heart through the blood vessel. Radio frequency waves destroy small areas of heart tissue where abnormal heartbeats may cause an arrhythmia to start. Please see the instruction sheet given to you today.  Scheduling to be determined.    Follow-Up: At [facility], you and your health needs are our priority.  As part of our continuing mission to provide you with exceptional heart care, we have created designated Provider Care Teams.  These Care Teams include your primary Cardiologist (physician) and Advanced Practice Providers (APPs -  Physician Assistants and Nurse Practitioners) who all work together to provide you with the care you need, when you need it.  We recommend signing up for the patient portal called "MyChart".  Sign up information is provided on this After Visit Summary.  MyChart is used to connect with patients for Virtual Visits (Telemedicine).  Patients are able to view lab/test results, encounter  notes, upcoming appointments, etc.  Non-urgent messages can be sent to your provider as well.   To learn more about what you can do with MyChart, go to [email or web address].    Your next appointment:   To be determined

## 2020-09-09 ENCOUNTER — Ambulatory Visit (AMBULATORY_SURGERY_CENTER): Payer: Medicare Other | Admitting: Gastroenterology

## 2020-09-09 ENCOUNTER — Encounter: Payer: Self-pay | Admitting: Gastroenterology

## 2020-09-09 VITALS — BP 101/80 | HR 68 | Temp 96.6°F | Resp 11 | Ht 70.0 in | Wt 179.0 lb

## 2020-09-09 DIAGNOSIS — K319 Disease of stomach and duodenum, unspecified: Secondary | ICD-10-CM

## 2020-09-09 DIAGNOSIS — K219 Gastro-esophageal reflux disease without esophagitis: Secondary | ICD-10-CM | POA: Diagnosis not present

## 2020-09-09 DIAGNOSIS — K2950 Unspecified chronic gastritis without bleeding: Secondary | ICD-10-CM | POA: Diagnosis not present

## 2020-09-09 DIAGNOSIS — R1310 Dysphagia, unspecified: Secondary | ICD-10-CM | POA: Diagnosis not present

## 2020-09-09 DIAGNOSIS — K2970 Gastritis, unspecified, without bleeding: Secondary | ICD-10-CM

## 2020-09-09 MED ORDER — SODIUM CHLORIDE 0.9 % IV SOLN: 500.0000 mL | Freq: Once | INTRAVENOUS | Status: DC

## 2020-09-09 MED ORDER — PANTOPRAZOLE SODIUM 40 MG PO TBEC: DELAYED_RELEASE_TABLET | ORAL | 3 refills | Status: DC

## 2020-09-09 NOTE — Progress Notes (Signed)
Pt's states no medical or surgical changes since previsit or office visit.  AB - vitals

## 2020-09-09 NOTE — Op Note (Signed)
[facility] Patient Name: Ronnie Vazquez Procedure Date: 09/09/2020 7:07 AM MRN: [id number] Endoscopist: Ladene Artist , MD Age: 74 Referring MD:  Date of Birth: Jun 29, 1946 Gender: Male Account #: [id number] Procedure:                Upper GI endoscopy Indications:              Dysphagia, Gastroesophageal reflux disease Medicines:                Monitored Anesthesia Care Procedure:                Pre-Anesthesia Assessment:                           - Prior to the procedure, a History and Physical                            was performed, and patient medications and                            allergies were reviewed. The patient's tolerance of                            previous anesthesia was also reviewed. The risks                            and benefits of the procedure and the sedation                            options and risks were discussed with the patient.                            All questions were answered, and informed consent                            was obtained. Prior Anticoagulants: The patient has                            taken Eliquis (apixaban), last dose was 2 days                            prior to procedure. ASA Grade Assessment: III - A                            patient with severe systemic disease. After                            reviewing the risks and benefits, the patient was                            deemed in satisfactory condition to undergo the                            procedure.  After obtaining informed consent, the endoscope was                            passed under direct vision. Throughout the                            procedure, the patient's blood pressure, pulse, and                            oxygen saturations were monitored continuously. The                            Endoscope was introduced through the mouth, and                            advanced to the second part of duodenum. The upper                             GI endoscopy was accomplished without difficulty.                            The patient tolerated the procedure fairly well. Scope In: Scope Out: Findings:                 The lumen of the upper third of the esophagus and                            middle third of the esophagus was mildly dilated.                           No endoscopic abnormality was evident in the                            esophagus to explain the patient's complaint of                            dysphagia. It was decided, however, to proceed with                            dilation of the entire esophagus. A guidewire was                            placed and the scope was withdrawn. Dilation was                            performed with a Savary dilator with no resistance                            at 16 mm.                           A few localized small erosions with no bleeding and  no stigmata of recent bleeding were found in the                            gastric antrum and in the prepyloric region of the                            stomach. Biopsies were taken with a cold forceps                            for histology.                           The exam of the stomach was otherwise normal.                           The duodenal bulb and second portion of the                            duodenum were normal. Complications:            No immediate complications. Estimated Blood Loss:     Estimated blood loss was minimal. Impression:               - Dilation in the upper third of the esophagus and                            in the middle third of the esophagus.                           - No endoscopic esophageal abnormality to explain                            patient's dysphagia. Esophagus dilated. Dilated.                           - Erosive gastropathy with no bleeding and no                            stigmata of recent bleeding. Biopsied.                            - Normal duodenal bulb and second portion of the                            duodenum. Recommendation:           - Patient has a contact number available for                            emergencies. The signs and symptoms of potential                            delayed complications were discussed with the  patient. Return to normal activities tomorrow.                            Written discharge instructions were provided to the                            patient.                           - Clear liquid diet for 2 hours, then advance as                            tolerated to soft diet today. Resume prior diet                            tomorrow.                           - Antireflux measures long term.                           - Continue present medications.                           - Await pathology results.                           - Protonix (pantoprazole) 40 mg PO BID for 1 month,                            then 40 mg po qd long term.                           - Await pathology results.                           - Resume Eliquis (apixaban) at prior dose in 2                            days. Refer to managing physician for further                            adjustment of therapy.                           - GI office visit in 6-8 weeks. Ladene Artist, MD 09/09/2020 8:23:35 AM This report has been signed electronically.

## 2020-09-09 NOTE — Progress Notes (Signed)
PT taken to PACU. Monitors in place. VSS. Report given to RN.  Atrial flutter noted during the procedure. VS remained stable otherwise. The pt reported that he did not take his BB as scheduled. 70 mg of esmolol given.

## 2020-09-09 NOTE — Patient Instructions (Signed)
Handouts given for GERD, Gastritis, Esophagitis and Stricture and Post dilation diet.  Resume Eliquis at prior dose in 2 days.  You may take your 81 mg aspirin daily.  Follow Post-dilation diet today.  Await pathology results.  YOU HAD AN ENDOSCOPIC PROCEDURE TODAY AT [facility]:   Refer to the procedure report that was given to you for any specific questions about what was found during the examination.  If the procedure report does not answer your questions, please call your gastroenterologist to clarify.  If you requested that your care partner not be given the details of your procedure findings, then the procedure report has been included in a sealed envelope for you to review at your convenience later.  YOU SHOULD EXPECT: Some feelings of bloating in the abdomen. Passage of more gas than usual.  Walking can help get rid of the air that was put into your GI tract during the procedure and reduce the bloating. If you had a lower endoscopy (such as a colonoscopy or flexible sigmoidoscopy) you may notice spotting of blood in your stool or on the toilet paper. If you underwent a bowel prep for your procedure, you may not have a normal bowel movement for a few days.  Please Note:  You might notice some irritation and congestion in your nose or some drainage.  This is from the oxygen used during your procedure.  There is no need for concern and it should clear up in a day or so.  SYMPTOMS TO REPORT IMMEDIATELY:   Following upper endoscopy (EGD)  Vomiting of blood or coffee ground material  New chest pain or pain under the shoulder blades  Painful or persistently difficult swallowing  New shortness of breath  Fever of 100F or higher  Black, tarry-looking stools  For urgent or emergent issues, a gastroenterologist can be reached at any hour by calling [phone]. Do not use MyChart messaging for urgent concerns.    DIET:  We do recommend a small meal at first, but then  you may proceed to your regular diet.  Drink plenty of fluids but you should avoid alcoholic beverages for 24 hours.  ACTIVITY:  You should plan to take it easy for the rest of today and you should NOT DRIVE or use heavy machinery until tomorrow (because of the sedation medicines used during the test).    FOLLOW UP: Our staff will call the number listed on your records 48-72 hours following your procedure to check on you and address any questions or concerns that you may have regarding the information given to you following your procedure. If we do not reach you, we will leave a message.  We will attempt to reach you two times.  During this call, we will ask if you have developed any symptoms of COVID 19. If you develop any symptoms (ie: fever, flu-like symptoms, shortness of breath, cough etc.) before then, please call [phone].  If you test positive for Covid 19 in the 2 weeks post procedure, please call and report this information to [place].    If any biopsies were taken you will be contacted by phone or by letter within the next 1-3 weeks.  Please call us at [phone] if you have not heard about the biopsies in 3 weeks.    SIGNATURES/CONFIDENTIALITY: You and/or your care partner have signed paperwork which will be entered into your electronic medical record.  These signatures attest to the fact that that the information above on  your After Visit Summary has been reviewed and is understood.  Full responsibility of the confidentiality of this discharge information lies with you and/or your care-partner. 

## 2020-09-11 ENCOUNTER — Telehealth: Payer: Self-pay

## 2020-09-11 NOTE — Telephone Encounter (Signed)
  Follow up Call-  Call back number 09/09/2020  Post procedure Call Back phone  # [phone]  Permission to leave phone message Yes  Some recent data might be hidden     Patient questions:  Do you have a fever, pain , or abdominal swelling? No. Pain Score  0 *  Have you tolerated food without any problems? Yes.    Have you been able to return to your normal activities? Yes.    Do you have any questions about your discharge instructions: Diet   No. Medications  No. Follow up visit  No.  Do you have questions or concerns about your Care? No.  Actions: * If pain score is 4 or above: 1. No action needed, pain <4.Have you developed a fever since your procedure? no  2.   Have you had an respiratory symptoms (SOB or cough) since your procedure? no  3.   Have you tested positive for COVID 19 since your procedure no  4.   Have you had any family members/close contacts diagnosed with the COVID 19 since your procedure?  no   If yes to any of these questions please route to Joylene Wai, RN and Joella Prince, RN

## 2020-09-11 NOTE — Telephone Encounter (Signed)
First post procedure follow up call, left message. °

## 2020-09-12 ENCOUNTER — Encounter: Payer: Self-pay | Admitting: Physician Assistant

## 2020-09-12 ENCOUNTER — Other Ambulatory Visit: Payer: Self-pay

## 2020-09-12 ENCOUNTER — Ambulatory Visit (HOSPITAL_COMMUNITY): Payer: Medicare Other | Attending: Cardiology

## 2020-09-12 DIAGNOSIS — I483 Typical atrial flutter: Secondary | ICD-10-CM | POA: Diagnosis not present

## 2020-09-12 DIAGNOSIS — E7800 Pure hypercholesterolemia, unspecified: Secondary | ICD-10-CM | POA: Diagnosis not present

## 2020-09-12 DIAGNOSIS — I2510 Atherosclerotic heart disease of native coronary artery without angina pectoris: Secondary | ICD-10-CM | POA: Diagnosis not present

## 2020-09-12 LAB — ECHOCARDIOGRAM COMPLETE
Area-P 1/2: 6.67 cm2
S' Lateral: 3.1 cm

## 2020-09-15 DIAGNOSIS — H903 Sensorineural hearing loss, bilateral: Secondary | ICD-10-CM | POA: Diagnosis not present

## 2020-09-15 DIAGNOSIS — H9313 Tinnitus, bilateral: Secondary | ICD-10-CM | POA: Diagnosis not present

## 2020-09-16 ENCOUNTER — Encounter: Payer: Self-pay | Admitting: Gastroenterology

## 2020-09-17 DIAGNOSIS — S335XXD Sprain of ligaments of lumbar spine, subsequent encounter: Secondary | ICD-10-CM | POA: Diagnosis not present

## 2020-09-19 ENCOUNTER — Telehealth: Payer: Self-pay | Admitting: Gastroenterology

## 2020-09-19 NOTE — Telephone Encounter (Signed)
Patient continues to have dysphagia.  He doesn't feel he can wait until he can come in in November.  He can't come for the offered appt next week, he is out of town.  Is there any additional testing he can have performed to help diagnosis the continued dysphagia prior to the office appt in November.

## 2020-09-19 NOTE — Telephone Encounter (Signed)
Please be sure he is taking pantoprazole 40 mg po bid to adequately control his reflux.  Adjust foods, beverages to those that are more easily tolerated.  Schedule esophageal manometry with Dr. Silverio Decamp which is the only test left to do for his problems.

## 2020-09-19 NOTE — Telephone Encounter (Signed)
Left message for patient to call back  

## 2020-09-19 NOTE — Telephone Encounter (Signed)
Patient states that he had an EGD a couple of weeks ago. He states that nothing was found but he is still having problems with swallowing. He says that he was told to take an antiacid twice daily and he is still having problems.

## 2020-09-22 NOTE — Telephone Encounter (Signed)
Left message for patient to call back  

## 2020-09-23 ENCOUNTER — Ambulatory Visit: Payer: Medicare Other | Admitting: Physician Assistant

## 2020-09-25 NOTE — Telephone Encounter (Signed)
Pt.camein because he wants to discuss with Dr.Klein further about what they talked about at his last appt. He would like to get an appt with Dr.Klein or a callback.

## 2020-09-29 DIAGNOSIS — S335XXD Sprain of ligaments of lumbar spine, subsequent encounter: Secondary | ICD-10-CM | POA: Diagnosis not present

## 2020-09-30 ENCOUNTER — Telehealth: Payer: Self-pay

## 2020-09-30 DIAGNOSIS — I483 Typical atrial flutter: Secondary | ICD-10-CM

## 2020-09-30 DIAGNOSIS — Z01812 Encounter for preprocedural laboratory examination: Secondary | ICD-10-CM

## 2020-09-30 NOTE — Telephone Encounter (Signed)
Spoke with pt to discuss date for A-Flutter ablation.  Pt request scheduling for 10/24/2020.  Pt advised once procedure is scheduled will call to discuss instructions.  Pt verbalizes understanding and agrees with current plan.

## 2020-09-30 NOTE — Telephone Encounter (Signed)
See 09/30/2020 encounter for details.

## 2020-10-03 NOTE — Telephone Encounter (Signed)
Attempted phone call to pt to review pt instructions for A-Fflutter Ablation.  Left message for pt to contact RN on 10/07/2020.

## 2020-10-06 ENCOUNTER — Telehealth: Payer: Self-pay | Admitting: Gastroenterology

## 2020-10-06 ENCOUNTER — Other Ambulatory Visit (HOSPITAL_COMMUNITY)
Admission: RE | Admit: 2020-10-06 | Discharge: 2020-10-06 | Disposition: A | Discharge status: Home / Self Care | Payer: Medicare Other | Source: Ambulatory Visit | Attending: Gastroenterology | Admitting: Gastroenterology

## 2020-10-06 ENCOUNTER — Telehealth: Payer: Self-pay | Admitting: Internal Medicine

## 2020-10-06 DIAGNOSIS — Z01812 Encounter for preprocedural laboratory examination: Secondary | ICD-10-CM | POA: Diagnosis not present

## 2020-10-06 DIAGNOSIS — Z20822 Contact with and (suspected) exposure to covid-19: Secondary | ICD-10-CM | POA: Insufficient documentation

## 2020-10-06 LAB — SARS CORONAVIRUS 2 (TAT 6-24 HRS): SARS Coronavirus 2: NEGATIVE

## 2020-10-06 NOTE — Telephone Encounter (Signed)
New Message  Pt called after hours line, returning call to [facility] regarding ablation procedure  Please call back

## 2020-10-06 NOTE — Telephone Encounter (Signed)
Patient called back to ask for clarification on possible side effects of Eliquis plus naproxen or ibuprofen.  Explained to patient that use of NSAIDs is associated with ulcers and/or bleeding in the stomach and this can be exacerbated by Eliquis.    Patient recently had an endoscopy which found "A few localized small erosions with no bleeding and no stigmata of recent bleeding were found in the gastric antrum and in the prepyloric region of the stomach. Biopsies were taken with a cold forceps for histology."    Explained to patient concern that while erosions were not bleeding, takign NSAIDs along with Eliquis will increase the risk that they might start.  Recommended patient try tylenol for his toe pain.  He voiced understanding.

## 2020-10-06 NOTE — Telephone Encounter (Signed)
Reviews instructions with pt re ablation and informed pt when comes in for pre procedure testing to request a copy of instructions at that time Pt verbalizes understanding Pt also asked what he could take for great toe pain (arthritis) Pt has script for Naproxen will confirm if may take with Pharmacist Also suggested Tylenol and pt states this does not help ./cy

## 2020-10-06 NOTE — Telephone Encounter (Signed)
Verified with pt that did get detailed message and pt has call out to GI MD .Ronnie Vazquez

## 2020-10-06 NOTE — Telephone Encounter (Signed)
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 He spoke to his pharmacist and was recommended to contact our office to discuss since he had recent "blood spots" after his Upper Endoscopy and is also on a BT.Informed patient that he can take Advil in moderation since he is taking his PPI twice daily and it has been a month since his procedure. Patient verbalized understanding.

## 2020-10-06 NOTE — Telephone Encounter (Signed)
Detailed message left for pt to NOT take to Naproxen due to ongoing GI issues ./cy

## 2020-10-06 NOTE — Telephone Encounter (Signed)
Do not recommend patient take naproxen as it can increase his risk of bleeding, especially due to patient has multiple small erosions in his stomach that were seen during his endoscopy

## 2020-10-07 ENCOUNTER — Telehealth: Payer: Self-pay | Admitting: Family Medicine

## 2020-10-07 ENCOUNTER — Other Ambulatory Visit: Payer: Medicare Other

## 2020-10-07 DIAGNOSIS — M79675 Pain in left toe(s): Secondary | ICD-10-CM | POA: Diagnosis not present

## 2020-10-07 NOTE — Telephone Encounter (Signed)
Spoke with pt and reviewed Instruction letter for A-Flutter ablation scheduled for 10/24/2020.  See letter for complete details.  Pt verbalizes understanding and agrees with current plan.

## 2020-10-07 NOTE — Telephone Encounter (Signed)
JK-This pt has a lab visit scheduled for 10.18.21 for pre-labs for a 72-month-F/U with you on 10.25.21/plz advise/what labs you would like drawn and I would be happy to enter them/thx dmf

## 2020-10-08 ENCOUNTER — Encounter (HOSPITAL_COMMUNITY)
Admission: RE | Disposition: A | Discharge status: Home / Self Care | Payer: Self-pay | Source: Home / Self Care | Attending: Gastroenterology

## 2020-10-08 ENCOUNTER — Ambulatory Visit (HOSPITAL_COMMUNITY)
Admission: RE | Admit: 2020-10-08 | Discharge: 2020-10-08 | Disposition: A | Discharge status: Home / Self Care | Payer: Medicare Other | Attending: Gastroenterology | Admitting: Gastroenterology

## 2020-10-08 ENCOUNTER — Encounter (HOSPITAL_COMMUNITY): Payer: Self-pay | Admitting: Gastroenterology

## 2020-10-08 ENCOUNTER — Other Ambulatory Visit: Payer: Self-pay | Admitting: Family Medicine

## 2020-10-08 ENCOUNTER — Telehealth: Payer: Self-pay | Admitting: Family Medicine

## 2020-10-08 DIAGNOSIS — K224 Dyskinesia of esophagus: Secondary | ICD-10-CM | POA: Insufficient documentation

## 2020-10-08 DIAGNOSIS — E7800 Pure hypercholesterolemia, unspecified: Secondary | ICD-10-CM

## 2020-10-08 DIAGNOSIS — K219 Gastro-esophageal reflux disease without esophagitis: Secondary | ICD-10-CM | POA: Insufficient documentation

## 2020-10-08 DIAGNOSIS — R1310 Dysphagia, unspecified: Secondary | ICD-10-CM | POA: Insufficient documentation

## 2020-10-08 DIAGNOSIS — M109 Gout, unspecified: Secondary | ICD-10-CM

## 2020-10-08 DIAGNOSIS — R7989 Other specified abnormal findings of blood chemistry: Secondary | ICD-10-CM

## 2020-10-08 DIAGNOSIS — E538 Deficiency of other specified B group vitamins: Secondary | ICD-10-CM

## 2020-10-08 DIAGNOSIS — R1319 Other dysphagia: Secondary | ICD-10-CM

## 2020-10-08 HISTORY — PX: ESOPHAGEAL MANOMETRY: SHX5429

## 2020-10-08 SURGERY — MANOMETRY, ESOPHAGUS
Anesthesia: Topical

## 2020-10-08 MED ORDER — LIDOCAINE VISCOUS HCL 2 % MT SOLN
OROMUCOSAL | Status: AC
  Filled 2020-10-08: qty 15

## 2020-10-08 SURGICAL SUPPLY — 2 items
FACESHIELD LNG OPTICON STERILE (SAFETY) IMPLANT
GLOVE BIO SURGEON STRL SZ8 (GLOVE) ×4 IMPLANT

## 2020-10-08 NOTE — Telephone Encounter (Signed)
done

## 2020-10-08 NOTE — Progress Notes (Signed)
Esophageal manometry performed per protocol.  Patient tolerated well.  Report to be sent to Dr. Kavitha Nandigam 

## 2020-10-08 NOTE — Telephone Encounter (Signed)
I did add on uric acid levels, but if pain and needing treatment let me know. Sometimes labs are altered during acute attack anyway.

## 2020-10-08 NOTE — Telephone Encounter (Signed)
Patient thinks he has Gout.  He is having labs next week so is wondering if he can get his acid levels checked to see if he has Goat in his foot.  Please give patient a call to let him know if he can or not.

## 2020-10-09 NOTE — Telephone Encounter (Signed)
Patient called back, was informed of the message below and stated he was seen at his neighbors office (Dr Mayer Camel) on 10/12.  Stated the provider that he was treated by suspected gout due to hot and redness in the toe, x-rays performed and he was diagnosed with arthritis.  Stated he was told the only way they could give actual diagnosis was to withdraw fluid from the area and the pt declined.  Message sent to PCP.

## 2020-10-09 NOTE — Telephone Encounter (Signed)
Left a message for the pt to return my call.  

## 2020-10-10 NOTE — Telephone Encounter (Signed)
Noted  

## 2020-10-13 ENCOUNTER — Other Ambulatory Visit: Payer: Medicare Other

## 2020-10-13 ENCOUNTER — Encounter (HOSPITAL_COMMUNITY): Payer: Self-pay | Admitting: Gastroenterology

## 2020-10-13 ENCOUNTER — Other Ambulatory Visit: Payer: Self-pay

## 2020-10-13 DIAGNOSIS — E7800 Pure hypercholesterolemia, unspecified: Secondary | ICD-10-CM | POA: Diagnosis not present

## 2020-10-13 DIAGNOSIS — M109 Gout, unspecified: Secondary | ICD-10-CM | POA: Diagnosis not present

## 2020-10-13 DIAGNOSIS — E538 Deficiency of other specified B group vitamins: Secondary | ICD-10-CM

## 2020-10-13 DIAGNOSIS — R7989 Other specified abnormal findings of blood chemistry: Secondary | ICD-10-CM

## 2020-10-14 LAB — COMPREHENSIVE METABOLIC PANEL
AG Ratio: 1.4 (calc) (ref 1.0–2.5)
ALT: 20 U/L (ref 9–46)
AST: 25 U/L (ref 10–35)
Albumin: 4 g/dL (ref 3.6–5.1)
Alkaline phosphatase (APISO): 51 U/L (ref 35–144)
BUN/Creatinine Ratio: 17 (calc) (ref 6–22)
BUN: 24 mg/dL (ref 7–25)
CO2: 25 mmol/L (ref 20–32)
Calcium: 9.6 mg/dL (ref 8.6–10.3)
Chloride: 106 mmol/L (ref 98–110)
Creat: 1.43 mg/dL — ABNORMAL HIGH (ref 0.70–1.18)
Globulin: 2.8 g/dL (calc) (ref 1.9–3.7)
Glucose, Bld: 100 mg/dL — ABNORMAL HIGH (ref 65–99)
Potassium: 4.5 mmol/L (ref 3.5–5.3)
Sodium: 141 mmol/L (ref 135–146)
Total Bilirubin: 0.4 mg/dL (ref 0.2–1.2)
Total Protein: 6.8 g/dL (ref 6.1–8.1)

## 2020-10-14 LAB — LIPID PANEL
Cholesterol: 172 mg/dL (ref <200)
HDL: 41 mg/dL (ref >=40)
LDL Cholesterol (Calc): 106 mg/dL (calc) — ABNORMAL HIGH
Non-HDL Cholesterol (Calc): 131 mg/dL (calc) — ABNORMAL HIGH (ref <130)
Total CHOL/HDL Ratio: 4.2 (calc) (ref <5.0)
Triglycerides: 134 mg/dL (ref <150)

## 2020-10-14 LAB — CBC WITH DIFFERENTIAL/PLATELET
Absolute Monocytes: 650 cells/uL (ref 200–950)
Basophils Absolute: 62 cells/uL (ref 0–200)
Basophils Relative: 1.1 %
Eosinophils Absolute: 162 cells/uL (ref 15–500)
Eosinophils Relative: 2.9 %
HCT: 46 % (ref 38.5–50.0)
Hemoglobin: 15.7 g/dL (ref 13.2–17.1)
Lymphs Abs: 1473 cells/uL (ref 850–3900)
MCH: 33 pg (ref 27.0–33.0)
MCHC: 34.1 g/dL (ref 32.0–36.0)
MCV: 96.6 fL (ref 80.0–100.0)
MPV: 10.6 fL (ref 7.5–12.5)
Monocytes Relative: 11.6 %
Neutro Abs: 3254 cells/uL (ref 1500–7800)
Neutrophils Relative %: 58.1 %
Platelets: 267 10*3/uL (ref 140–400)
RBC: 4.76 10*6/uL (ref 4.20–5.80)
RDW: 12.8 % (ref 11.0–15.0)
Total Lymphocyte: 26.3 %
WBC: 5.6 10*3/uL (ref 3.8–10.8)

## 2020-10-14 LAB — AMMONIA: Ammonia: 31 umol/L (ref <=72)

## 2020-10-14 LAB — URIC ACID: Uric Acid, Serum: 5.9 mg/dL (ref 4.0–8.0)

## 2020-10-14 LAB — VITAMIN B12: Vitamin B-12: 462 pg/mL (ref 200–1100)

## 2020-10-15 ENCOUNTER — Telehealth: Payer: Self-pay | Admitting: *Deleted

## 2020-10-15 NOTE — Telephone Encounter (Signed)
Patient stated he is returning a call. I made pt aware I will get a message to the nurse.

## 2020-10-17 ENCOUNTER — Telehealth: Payer: Self-pay | Admitting: Gastroenterology

## 2020-10-17 NOTE — Telephone Encounter (Signed)
Patient called requesting results from his procedure

## 2020-10-17 NOTE — Telephone Encounter (Signed)
Patient notified results are not read back yet and we will call when they are available

## 2020-10-20 ENCOUNTER — Other Ambulatory Visit: Payer: Self-pay

## 2020-10-20 ENCOUNTER — Encounter: Payer: Self-pay | Admitting: Family Medicine

## 2020-10-20 ENCOUNTER — Ambulatory Visit (INDEPENDENT_AMBULATORY_CARE_PROVIDER_SITE_OTHER): Payer: Medicare Other | Admitting: Family Medicine

## 2020-10-20 VITALS — BP 90/60 | HR 89 | Temp 98.1°F | Ht 70.0 in | Wt 173.6 lb

## 2020-10-20 DIAGNOSIS — W57XXXS Bitten or stung by nonvenomous insect and other nonvenomous arthropods, sequela: Secondary | ICD-10-CM | POA: Diagnosis not present

## 2020-10-20 DIAGNOSIS — R350 Frequency of micturition: Secondary | ICD-10-CM

## 2020-10-20 DIAGNOSIS — R1310 Dysphagia, unspecified: Secondary | ICD-10-CM | POA: Diagnosis not present

## 2020-10-20 DIAGNOSIS — I483 Typical atrial flutter: Secondary | ICD-10-CM | POA: Diagnosis not present

## 2020-10-20 DIAGNOSIS — I25118 Atherosclerotic heart disease of native coronary artery with other forms of angina pectoris: Secondary | ICD-10-CM

## 2020-10-20 NOTE — Progress Notes (Signed)
Ronnie Vazquez DOB: 05/08/1946 Encounter date: 10/20/2020  This is a 74 y.o. male who presents with Chief Complaint  Patient presents with   Follow-up    History of present illness: Last visit was 08/22/2020.  At that time he was having a difficult time with sequelae of biting into hard spring will with throat pain and was also diagnosed with atrial flutter.  He does have an ablation scheduled for later in the week after being evaluated by cardiology.  Recently had swallow study. At end was told that problem was found. It feels like when he eats he can push everything down, but then feels last 10 % holding up. Then affects his diet. Feels something in throat most nights. Eating ice cream, pudding, mashed potatoes because if he can push what is in there down he doesn't feel it as much. Not eating as much because it is uncomfortable. Stretching with endoscopy didn't help. Has only once or twice felt acid, but otherwise not. Has changed diet to try and help with swallowing.   Five months ago pulled off tick from neck. Wonders if any relation.   Has had some issues with dizziness - just occasional.   Allergies  Allergen Reactions   Repatha [Evolocumab] Other (See Comments)    Myalgias   Bempedoic Acid Other (See Comments)    (Nexletol) muscle pain.   Crestor [Rosuvastatin Calcium] Other (See Comments)    Myalgias (muscle/joint pain)   Current Meds  Medication Sig   apixaban (ELIQUIS) 5 MG TABS tablet Take 1 tablet (5 mg total) by mouth 2 (two) times daily.   aspirin EC 81 MG tablet Take 81 mg by mouth daily with lunch. Swallow whole.   Cyanocobalamin (VITAMIN B-12 PO) Take 1 tablet by mouth daily.   diclofenac Sodium (VOLTAREN) 1 % GEL Apply 1 application topically 4 (four) times daily as needed (arthritis pain.).   ezetimibe (ZETIA) 10 MG tablet TAKE 1 TABLET BY MOUTH EVERY DAY (Patient taking differently: Take 10 mg by mouth daily. )   metoprolol succinate (TOPROL XL) 25 MG  24 hr tablet Take 0.5 tablets (12.5 mg total) by mouth daily. (Patient taking differently: Take 12.5 mg by mouth daily with lunch. )   naproxen (NAPROSYN) 500 MG tablet Take 1 tablet (500 mg total) by mouth 2 (two) times daily as needed.   nitroGLYCERIN (NITROSTAT) 0.4 MG SL tablet Place 1 tablet (0.4 mg total) under the tongue every 5 (five) minutes as needed for chest pain. (Patient taking differently: Place 0.4 mg under the tongue every 5 (five) minutes x 3 doses as needed for chest pain. )   pantoprazole (PROTONIX) 40 MG tablet Take 40 mg twice a day for one month, then take 40 mg every day long term. (Patient taking differently: Take 40 mg by mouth daily before breakfast. )    Review of Systems  Constitutional: Negative for chills, fatigue and fever.  Respiratory: Negative for cough, chest tightness, shortness of breath and wheezing.   Cardiovascular: Negative for chest pain, palpitations and leg swelling.  Gastrointestinal: Negative for abdominal pain, constipation and nausea.       Still with pain, difficulty with swallowing    Objective:  BP 90/60 (BP Location: Left Arm, Patient Position: Sitting, Cuff Size: Normal)    Pulse 89    Temp 98.1 F (36.7 C) (Oral)    Ht 5\' 10"  (1.778 m)    Wt 173 lb 9.6 oz (78.7 kg)    SpO2 96%  BMI 24.91 kg/m   Weight: 173 lb 9.6 oz (78.7 kg)   BP Readings from Last 3 Encounters:  10/21/20 107/74  10/20/20 90/60  09/09/20 101/80   Wt Readings from Last 3 Encounters:  10/21/20 174 lb 12.8 oz (79.3 kg)  10/20/20 173 lb 9.6 oz (78.7 kg)  09/09/20 179 lb (81.2 kg)    Physical Exam Constitutional:      General: He is not in acute distress.    Appearance: He is well-developed.  Cardiovascular:     Rate and Rhythm: Normal rate. Rhythm irregularly irregular.     Heart sounds: Normal heart sounds. No murmur heard.  No friction rub.  Pulmonary:     Effort: Pulmonary effort is normal. No respiratory distress.     Breath sounds: Normal breath  sounds. No wheezing or rales.  Musculoskeletal:     Right lower leg: No edema.     Left lower leg: No edema.  Neurological:     Mental Status: He is alert and oriented to person, place, and time.  Psychiatric:        Behavior: Behavior normal.     Assessment/Plan    1. Tick bite, unspecified site, sequela Uncertain if related to ongoing symptoms.  Patient did not recall having any sort of viral illness symptoms after tick bite.  Uncertain how long tick was attached for. - Lyme DiseaseAntibody(IgG), Immunoblot; Future  2. Urinary frequency We will check urine for any infection since going more frequently. - PSA; Future - Urinalysis with Culture Reflex; Future - Urinalysis with Culture Reflex  3. Typical atrial flutter [facility]) He is following with cardiology this week for ablation.  I have printed out lab orders today for him to complete at visit tomorrow (if able)  4. Dysphagia, unspecified type He has followed with GI and had endoscopy for this.  He has not had any improvement with esophageal stretching.  He still awaiting results of EP study.  Return for pending bloodwork.    Micheline Rough, MD

## 2020-10-20 NOTE — Telephone Encounter (Signed)
Patient seen in the office today.

## 2020-10-21 ENCOUNTER — Other Ambulatory Visit: Payer: Medicare Other | Admitting: *Deleted

## 2020-10-21 ENCOUNTER — Ambulatory Visit (INDEPENDENT_AMBULATORY_CARE_PROVIDER_SITE_OTHER): Payer: Medicare Other | Admitting: Cardiology

## 2020-10-21 ENCOUNTER — Telehealth: Payer: Self-pay

## 2020-10-21 ENCOUNTER — Telehealth: Payer: Self-pay | Admitting: Family Medicine

## 2020-10-21 ENCOUNTER — Other Ambulatory Visit (HOSPITAL_COMMUNITY)
Admission: RE | Admit: 2020-10-21 | Discharge: 2020-10-21 | Disposition: A | Discharge status: Home / Self Care | Payer: Medicare Other | Source: Ambulatory Visit | Attending: Internal Medicine | Admitting: Internal Medicine

## 2020-10-21 VITALS — BP 107/74 | HR 54 | Ht 70.0 in | Wt 174.8 lb

## 2020-10-21 DIAGNOSIS — Z01812 Encounter for preprocedural laboratory examination: Secondary | ICD-10-CM

## 2020-10-21 DIAGNOSIS — Z20822 Contact with and (suspected) exposure to covid-19: Secondary | ICD-10-CM | POA: Diagnosis not present

## 2020-10-21 DIAGNOSIS — I483 Typical atrial flutter: Secondary | ICD-10-CM

## 2020-10-21 LAB — BASIC METABOLIC PANEL
BUN/Creatinine Ratio: 21 (ref 10–24)
BUN: 29 mg/dL — ABNORMAL HIGH (ref 8–27)
CO2: 28 mmol/L (ref 20–29)
Calcium: 10 mg/dL (ref 8.6–10.2)
Chloride: 105 mmol/L (ref 96–106)
Creatinine, Ser: 1.4 mg/dL — ABNORMAL HIGH (ref 0.76–1.27)
GFR calc Af Amer: 58 mL/min/{1.73_m2} — ABNORMAL LOW (ref >59)
GFR calc non Af Amer: 50 mL/min/{1.73_m2} — ABNORMAL LOW (ref >59)
Glucose: 82 mg/dL (ref 65–99)
Potassium: 4.8 mmol/L (ref 3.5–5.2)
Sodium: 140 mmol/L (ref 134–144)

## 2020-10-21 LAB — CBC
Hematocrit: 44.8 % (ref 37.5–51.0)
Hemoglobin: 15.3 g/dL (ref 13.0–17.7)
MCH: 32.4 pg (ref 26.6–33.0)
MCHC: 34.2 g/dL (ref 31.5–35.7)
MCV: 95 fL (ref 79–97)
Platelets: 270 10*3/uL (ref 150–450)
RBC: 4.72 x10E6/uL (ref 4.14–5.80)
RDW: 13.7 % (ref 11.6–15.4)
WBC: 6.4 10*3/uL (ref 3.4–10.8)

## 2020-10-21 LAB — URINALYSIS W MICROSCOPIC + REFLEX CULTURE
Bacteria, UA: NONE SEEN /HPF
Bilirubin Urine: NEGATIVE
Glucose, UA: NEGATIVE
Hgb urine dipstick: NEGATIVE
Hyaline Cast: NONE SEEN /LPF
Ketones, ur: NEGATIVE
Leukocyte Esterase: NEGATIVE
Nitrites, Initial: NEGATIVE
Protein, ur: NEGATIVE
RBC / HPF: NONE SEEN /HPF (ref 0–2)
Specific Gravity, Urine: 1.017 (ref 1.001–1.03)
Squamous Epithelial / HPF: NONE SEEN /HPF (ref <=5)
WBC, UA: NONE SEEN /HPF (ref 0–5)
pH: <=5 (ref 5.0–8.0)

## 2020-10-21 LAB — SARS CORONAVIRUS 2 (TAT 6-24 HRS): SARS Coronavirus 2: NEGATIVE

## 2020-10-21 LAB — NO CULTURE INDICATED

## 2020-10-21 NOTE — Progress Notes (Signed)
Pt presents today for EKG as he is scheduled for an A-flutter Ablation 10/24/2020 with Dr Caryl Comes.    EKG to determine if pt remains in A-Flutter for management of Eliquis.  Pt reports he feels as if he is still in A-Flutter but denies any symptoms at this time.  Dr Quentin Ore reviewed  EKG and confirms pt remains in A-Flutter.  Will also have Dr Caryl Comes review EKG for medication instructions and will contact pt once Dr Caryl Comes has reviewed.  Pt verbalizes understanding.  Hard copy of Ablation letter provided to pt.

## 2020-10-21 NOTE — Telephone Encounter (Signed)
The patient was not 100% sure on what Ronnie Vazquez was telling him and would like for [name] to contact him to make sure he understands that he doesn't have an infection and what kind of infection was the doctor looking for.  Please advise

## 2020-10-21 NOTE — Patient Instructions (Signed)
Medication Instructions:  Your physician recommends that you continue on your current medications as directed. Please refer to the Current Medication list given to you today.  *If you need a refill on your cardiac medications before your next appointment, please call your pharmacy*   Lab Work: CBC, BMET and Covid Screening today as scheduled  If you have labs (blood work) drawn today and your tests are completely normal, you will receive your results only by: Marland Kitchen MyChart Message (if you have MyChart) OR . A paper copy in the mail If you have any lab test that is abnormal or we need to change your treatment, we will call you to review the results.   Testing/Procedures: EKG today   Follow-Up: At [facility], you and your health needs are our priority.  As part of our continuing mission to provide you with exceptional heart care, we have created designated Provider Care Teams.  These Care Teams include your primary Cardiologist (physician) and Advanced Practice Providers (APPs -  Physician Assistants and Nurse Practitioners) who all work together to provide you with the care you need, when you need it.  We recommend signing up for the patient portal called "MyChart".  Sign up information is provided on this After Visit Summary.  MyChart is used to connect with patients for Virtual Visits (Telemedicine).  Patients are able to view lab/test results, encounter notes, upcoming appointments, etc.  Non-urgent messages can be sent to your provider as well.   To learn more about what you can do with MyChart, go to [email or web address].    Your next appointment:   A-Flutter Ablation as scheduled for 10/24/2020

## 2020-10-21 NOTE — Telephone Encounter (Signed)
Pt returning phone call.  Pt advised Dr Caryl Comes has reviewed his EKG and advises pt to continue Eliquis as prescribed prior to Ablation scheduled for 10/24/2020.  Pt verbalizes understanding of nothing to eat or drink after midnight the night prior to his procedure.  Pt is to report to Short Stay at [facility] at 0530am on 10/24/2020.

## 2020-10-22 NOTE — Telephone Encounter (Signed)
Left message on machine for patient to return our call 

## 2020-10-22 NOTE — Telephone Encounter (Signed)
No infection in urine.   Please check with patient - did cardio lab do other orders? Doesn't look like psa or lyme are pending?

## 2020-10-22 NOTE — Telephone Encounter (Signed)
Spoke with patient and a lab appointment made.  He did not go to Cardiology and would like his labs done here.

## 2020-10-23 NOTE — Progress Notes (Signed)
Instructed patient on the following items: Arrival time 0530 Nothing to eat or drink after midnight No meds AM of procedure Responsible person to drive you home and stay with you for 24 hrs  Have you missed any doses of anti-coagulant On Eliquis- hasn't missed any doses.

## 2020-10-24 ENCOUNTER — Ambulatory Visit (HOSPITAL_COMMUNITY)
Admission: RE | Admit: 2020-10-24 | Discharge: 2020-10-24 | Disposition: A | Discharge status: Home / Self Care | Payer: Medicare Other | Attending: Internal Medicine | Admitting: Internal Medicine

## 2020-10-24 ENCOUNTER — Ambulatory Visit (HOSPITAL_COMMUNITY): Payer: Medicare Other | Admitting: Certified Registered Nurse Anesthetist

## 2020-10-24 ENCOUNTER — Encounter (HOSPITAL_COMMUNITY): Payer: Self-pay | Admitting: Internal Medicine

## 2020-10-24 ENCOUNTER — Ambulatory Visit (HOSPITAL_COMMUNITY)
Admission: RE | Disposition: A | Discharge status: Home / Self Care | Payer: Self-pay | Source: Home / Self Care | Attending: Internal Medicine

## 2020-10-24 ENCOUNTER — Other Ambulatory Visit: Payer: Self-pay

## 2020-10-24 DIAGNOSIS — G459 Transient cerebral ischemic attack, unspecified: Secondary | ICD-10-CM | POA: Diagnosis not present

## 2020-10-24 DIAGNOSIS — R1310 Dysphagia, unspecified: Secondary | ICD-10-CM | POA: Diagnosis not present

## 2020-10-24 DIAGNOSIS — Z8249 Family history of ischemic heart disease and other diseases of the circulatory system: Secondary | ICD-10-CM | POA: Diagnosis not present

## 2020-10-24 DIAGNOSIS — I483 Typical atrial flutter: Secondary | ICD-10-CM | POA: Diagnosis not present

## 2020-10-24 DIAGNOSIS — I2510 Atherosclerotic heart disease of native coronary artery without angina pectoris: Secondary | ICD-10-CM | POA: Insufficient documentation

## 2020-10-24 DIAGNOSIS — Z955 Presence of coronary angioplasty implant and graft: Secondary | ICD-10-CM | POA: Diagnosis not present

## 2020-10-24 DIAGNOSIS — E785 Hyperlipidemia, unspecified: Secondary | ICD-10-CM | POA: Diagnosis not present

## 2020-10-24 DIAGNOSIS — Z888 Allergy status to other drugs, medicaments and biological substances status: Secondary | ICD-10-CM | POA: Diagnosis not present

## 2020-10-24 DIAGNOSIS — M1990 Unspecified osteoarthritis, unspecified site: Secondary | ICD-10-CM | POA: Diagnosis not present

## 2020-10-24 DIAGNOSIS — I4892 Unspecified atrial flutter: Secondary | ICD-10-CM | POA: Diagnosis not present

## 2020-10-24 HISTORY — PX: A-FLUTTER ABLATION: EP1230

## 2020-10-24 SURGERY — A-FLUTTER ABLATION
Anesthesia: General

## 2020-10-24 MED ORDER — SUGAMMADEX SODIUM 200 MG/2ML IV SOLN
INTRAVENOUS | Status: DC | PRN
  Administered 2020-10-24: 200 mg via INTRAVENOUS

## 2020-10-24 MED ORDER — APIXABAN 5 MG PO TABS
5.0000 mg | ORAL_TABLET | ORAL | Status: AC
  Administered 2020-10-24: 5 mg via ORAL
  Filled 2020-10-24: qty 1

## 2020-10-24 MED ORDER — SODIUM CHLORIDE 0.9 % IV SOLN: 250.0000 mL | INTRAVENOUS | Status: DC | PRN

## 2020-10-24 MED ORDER — SODIUM CHLORIDE 0.9 % IV SOLN: INTRAVENOUS | Status: DC

## 2020-10-24 MED ORDER — LIDOCAINE HCL (PF) 1 % IJ SOLN
INTRAMUSCULAR | Status: AC
  Filled 2020-10-24: qty 60

## 2020-10-24 MED ORDER — HEPARIN (PORCINE) IN NACL 1000-0.9 UT/500ML-% IV SOLN
INTRAVENOUS | Status: DC | PRN
  Administered 2020-10-24 (×2): 500 mL

## 2020-10-24 MED ORDER — HEPARIN (PORCINE) IN NACL 1000-0.9 UT/500ML-% IV SOLN
INTRAVENOUS | Status: AC
  Filled 2020-10-24: qty 500

## 2020-10-24 MED ORDER — LIDOCAINE 2% (20 MG/ML) 5 ML SYRINGE
INTRAMUSCULAR | Status: DC | PRN
  Administered 2020-10-24: 50 mg via INTRAVENOUS

## 2020-10-24 MED ORDER — PHENYLEPHRINE 40 MCG/ML (10ML) SYRINGE FOR IV PUSH (FOR BLOOD PRESSURE SUPPORT)
PREFILLED_SYRINGE | INTRAVENOUS | Status: DC | PRN
  Administered 2020-10-24 (×2): 80 ug via INTRAVENOUS

## 2020-10-24 MED ORDER — MIDAZOLAM HCL 5 MG/5ML IJ SOLN
INTRAMUSCULAR | Status: DC | PRN
  Administered 2020-10-24: 2 mg via INTRAVENOUS

## 2020-10-24 MED ORDER — CALCIUM CARBONATE ANTACID 500 MG PO CHEW
2.0000 | CHEWABLE_TABLET | Freq: Once | ORAL | Status: AC
  Administered 2020-10-24: 400 mg via ORAL
  Filled 2020-10-24: qty 2

## 2020-10-24 MED ORDER — PROPOFOL 10 MG/ML IV BOLUS
INTRAVENOUS | Status: DC | PRN
  Administered 2020-10-24: 160 mg via INTRAVENOUS

## 2020-10-24 MED ORDER — BUPIVACAINE HCL (PF) 0.25 % IJ SOLN
INTRAMUSCULAR | Status: AC
  Filled 2020-10-24: qty 30

## 2020-10-24 MED ORDER — ROCURONIUM BROMIDE 10 MG/ML (PF) SYRINGE
PREFILLED_SYRINGE | INTRAVENOUS | Status: DC | PRN
  Administered 2020-10-24: 50 mg via INTRAVENOUS

## 2020-10-24 MED ORDER — PHENYLEPHRINE HCL-NACL 10-0.9 MG/250ML-% IV SOLN
INTRAVENOUS | Status: DC | PRN
  Administered 2020-10-24: 25 ug/min via INTRAVENOUS

## 2020-10-24 MED ORDER — ACETAMINOPHEN 325 MG PO TABS: 650.0000 mg | ORAL_TABLET | ORAL | Status: DC | PRN

## 2020-10-24 MED ORDER — ONDANSETRON HCL 4 MG/2ML IJ SOLN
INTRAMUSCULAR | Status: DC | PRN
  Administered 2020-10-24: 4 mg via INTRAVENOUS

## 2020-10-24 MED ORDER — SODIUM CHLORIDE 0.9% FLUSH: 3.0000 mL | Freq: Two times a day (BID) | INTRAVENOUS | Status: DC

## 2020-10-24 MED ORDER — FENTANYL CITRATE (PF) 100 MCG/2ML IJ SOLN
INTRAMUSCULAR | Status: DC | PRN
  Administered 2020-10-24 (×2): 50 ug via INTRAVENOUS

## 2020-10-24 MED ORDER — SODIUM CHLORIDE 0.9% FLUSH: 3.0000 mL | INTRAVENOUS | Status: DC | PRN

## 2020-10-24 MED ORDER — DEXAMETHASONE SODIUM PHOSPHATE 10 MG/ML IJ SOLN
INTRAMUSCULAR | Status: DC | PRN
  Administered 2020-10-24: 4 mg via INTRAVENOUS

## 2020-10-24 MED ORDER — ONDANSETRON HCL 4 MG/2ML IJ SOLN: 4.0000 mg | Freq: Four times a day (QID) | INTRAMUSCULAR | Status: DC | PRN

## 2020-10-24 SURGICAL SUPPLY — 13 items
BLANKET WARM UNDERBOD FULL ACC (MISCELLANEOUS) ×2 IMPLANT
CATH BLAZERPRIME XP LG CV 10MM (ABLATOR) ×2 IMPLANT
CATH DUODECA HALO/ISMUS 7FR (CATHETERS) ×2 IMPLANT
CATH OCTAPOLOR 6F 125CM 2-5-2 (CATHETERS) ×2 IMPLANT
CATH QUAD COURNAND 5FR REPROC (CATHETERS) ×2 IMPLANT
MAT PREVALON FULL STRYKER (MISCELLANEOUS) ×2 IMPLANT
PACK EP LATEX FREE (CUSTOM PROCEDURE TRAY) ×1
PACK EP LF (CUSTOM PROCEDURE TRAY) ×1 IMPLANT
PAD PRO RADIOLUCENT 2001M-C (PAD) ×2 IMPLANT
SHEATH ATRIAL FLUTTER SAFL 8F (SHEATH) ×2 IMPLANT
SHEATH PINNACLE 6F 10CM (SHEATH) ×2 IMPLANT
SHEATH PINNACLE 7F 10CM (SHEATH) ×2 IMPLANT
SHEATH PINNACLE 8F 10CM (SHEATH) ×4 IMPLANT

## 2020-10-24 NOTE — Anesthesia Preprocedure Evaluation (Addendum)
Anesthesia Evaluation  Patient identified by MRN, date of birth, ID band Patient awake    Reviewed: Allergy & Precautions, NPO status , Patient's Chart, lab work & pertinent test results  Airway Mallampati: I  TM Distance: >3 FB Neck ROM: Full    Dental  (+) Teeth Intact, Dental Advisory Given   Pulmonary neg pulmonary ROS,    breath sounds clear to auscultation       Cardiovascular + CAD and + Cardiac Stents  + dysrhythmias Atrial Fibrillation  Rhythm:Regular Rate:Normal     Neuro/Psych TIAnegative psych ROS   GI/Hepatic Neg liver ROS, GERD  Medicated,  Endo/Other  negative endocrine ROS  Renal/GU negative Renal ROS     Musculoskeletal  (+) Arthritis ,   Abdominal Normal abdominal exam  (+)   Peds  Hematology negative hematology ROS (+)   Anesthesia Other Findings   Reproductive/Obstetrics                            Anesthesia Physical Anesthesia Plan  ASA: III  Anesthesia Plan: General   Post-op Pain Management:    Induction: Intravenous  PONV Risk Score and Plan: 2 and Ondansetron and Dexamethasone  Airway Management Planned: Oral ETT  Additional Equipment: None  Intra-op Plan:   Post-operative Plan: Extubation in OR  Informed Consent: I have reviewed the patients History and Physical, chart, labs and discussed the procedure including the risks, benefits and alternatives for the proposed anesthesia with the patient or authorized representative who has indicated his/her understanding and acceptance.       Plan Discussed with: CRNA  Anesthesia Plan Comments:        Anesthesia Quick Evaluation

## 2020-10-24 NOTE — Progress Notes (Signed)
Site area: LEFT GROIN VENOUS SITE Site Prior to Removal:  Level 0 Pressure Applied For: 15 MINUTES Manual:   YES Patient Status During Pull:  STABLE Post Pull Site:  Level 0 Post Pull Instructions Given:  YES Post Pull Pulses Present: LEFT PT PALPABLE Dressing Applied:  GAUZE AND TEGADERM Bedrest begins @ 1010 Comments: IV SALINE LOCKED

## 2020-10-24 NOTE — Progress Notes (Signed)
Site area: RT GROIN VENOUS SITE Site Prior to Removal:  Level 0 Pressure Applied For: 15 MINUTES Manual:   YES Patient Status During Pull:  STABLE Post Pull Site:  Level 0 Post Pull Instructions Given:  YES Post Pull Pulses Present: RT PT PALPABLE Dressing Applied:  GAUZE AND TEGADERM Bedrest begins @  Comments:

## 2020-10-24 NOTE — H&P (Signed)
Patient Care Team: Caren Macadam, MD as PCP - General (Family Medicine) Sherren Mocha, MD as PCP - Cardiology (Cardiology)   HPI  Ronnie Vazquez is a 74 y.o. male admitted for flutter ablation   Mild breathlessness and without palpitations    Known CAD with prior stnting   Some dysphagia     DATE TEST EF   8/14 Echo   65 %   8/19 LHC  55-60* % LADp-90/70>>stent  8/20 Myoview  60% No ischemia   Date Cr K Hgb  2/21 1.34 4.7 15.3          Thromboembolic risk factors ( age -49, TIA/CVA-2 (no clear consensus, Vasc disease -1) for a CHADSVASc Score of >=2-4    Records and Results Reviewed   Past Medical History:  Diagnosis Date  . Arthritis    toe  . Atrial flutter ([place])    Echo 9/21: EF 60-65, no RWMA, normal RVSF, trivial MR, borderline dilation of aorta at sinus of Valsalva (37 mm), mild LAE, RVSP 26.8  . Chicken pox    hx  . Coronary artery disease    08/29/18 PCI/DES with bifurcation from LAD into 1st diag.   Marland Kitchen GERD (gastroesophageal reflux disease)    with certain foods/on OTC meds  . Hyperlipidemia    on meds  . Left shoulder pain 02/02/2013  . Microscopic hematuria 02/02/2013  . Vision loss of left eye     Past Surgical History:  Procedure Laterality Date  . COLONOSCOPY    . CORONARY BALLOON ANGIOPLASTY N/A 08/29/2018   Procedure: CORONARY BALLOON ANGIOPLASTY;  Surgeon: Sherren Mocha, MD;  Location: [place] [facility];  Service: Cardiovascular;  Laterality: N/A;  . CORONARY STENT INTERVENTION N/A 08/29/2018   Procedure: CORONARY STENT INTERVENTION;  Surgeon: Sherren Mocha, MD;  Location: [place] [facility];  Service: Cardiovascular;  Laterality: N/A;  . ESOPHAGEAL MANOMETRY N/A 10/08/2020   Procedure: ESOPHAGEAL MANOMETRY (EM);  Surgeon: Ladene Artist, MD;  Location: [facility];  Service: Endoscopy;  Laterality: N/A;  . LEFT HEART CATH AND CORONARY ANGIOGRAPHY N/A 08/08/2018   Procedure: LEFT HEART CATH AND CORONARY ANGIOGRAPHY;   Surgeon: Lorretta Harp, MD;  Location: [place] [facility];  Service: Cardiovascular;  Laterality: N/A;  . TONSILLECTOMY AND ADENOIDECTOMY  1959  . WISDOM TOOTH EXTRACTION  1975    Current Facility-Administered Medications  Medication Dose Route Frequency Provider Last Rate Last Admin  . 0.9 %  sodium chloride infusion   Intravenous Continuous Deboraha Sprang, MD 50 mL/hr at 10/24/20 0240 New Bag at 10/24/20 9735    Allergies  Allergen Reactions  . Repatha [Evolocumab] Other (See Comments)    Myalgias  . Bempedoic Acid Other (See Comments)    (Nexletol) muscle pain.  Marland Kitchen Crestor [Rosuvastatin Calcium] Other (See Comments)    Myalgias (muscle/joint pain)      Social History   Tobacco Use  . Smoking status: Never Smoker  . Smokeless tobacco: Never Used  Vaping Use  . Vaping Use: Never used  Substance Use Topics  . Alcohol use: Yes    Alcohol/week: 8.0 standard drinks    Types: 8 Glasses of wine per week  . Drug use: No     Family History  Problem Relation Age of Onset  . Prostate cancer Father   . Arthritis Mother   . Arthritis/Rheumatoid Brother   . Colon cancer Neg Hx   . Esophageal cancer Neg Hx   . Pancreatic  cancer Neg Hx   . Rectal cancer Neg Hx   . Stomach cancer Neg Hx   . Heart disease Neg Hx      Current Meds  Medication Sig  . apixaban (ELIQUIS) 5 MG TABS tablet Take 1 tablet (5 mg total) by mouth 2 (two) times daily.  Marland Kitchen aspirin EC 81 MG tablet Take 81 mg by mouth daily with lunch. Swallow whole.  . Cyanocobalamin (VITAMIN B-12 PO) Take 1 tablet by mouth daily.  . diclofenac Sodium (VOLTAREN) 1 % GEL Apply 1 application topically 4 (four) times daily as needed (arthritis pain.).  Marland Kitchen ezetimibe (ZETIA) 10 MG tablet TAKE 1 TABLET BY MOUTH EVERY DAY (Patient taking differently: Take 10 mg by mouth daily. )  . metoprolol succinate (TOPROL XL) 25 MG 24 hr tablet Take 0.5 tablets (12.5 mg total) by mouth daily. (Patient taking differently: Take 12.5 mg by  mouth daily with lunch. )  . nitroGLYCERIN (NITROSTAT) 0.4 MG SL tablet Place 1 tablet (0.4 mg total) under the tongue every 5 (five) minutes as needed for chest pain. (Patient taking differently: Place 0.4 mg under the tongue every 5 (five) minutes x 3 doses as needed for chest pain. )  . pantoprazole (PROTONIX) 40 MG tablet Take 40 mg twice a day for one month, then take 40 mg every day long term. (Patient taking differently: Take 40 mg by mouth daily before breakfast. )     Review of Systems negative except from HPI and PMH  Physical Exam BP 121/87   Pulse (!) 103   Temp 98.9 F (37.2 C) (Oral)   Resp 17   Ht 5\' 10"  (1.778 m)   Wt 75.3 kg   SpO2 96%   BMI 23.82 kg/m  Well developed and well nourished in no acute distress HENT normal E scleral and icterus clear Neck Supple JVP flat; carotids brisk and full Clear to ausculation  Regular rate and rhythm, no murmurs gallops or rub Soft with active bowel sounds No clubbing cyanosis  } Edema Alert and oriented, grossly normal motor and sensory function Skin Warm and Dry    Assessment and  Plan Atrial Flutter RVR  CAD with prior LAD PCI  TIA ?  Dysphagia    For flutter ablation  Risks and benefits reviewed  On Apixoban  And took dose last pm

## 2020-10-24 NOTE — Anesthesia Procedure Notes (Signed)
Procedure Name: Intubation Date/Time: 10/24/2020 8:00 AM Performed by: Colin Benton, CRNA Pre-anesthesia Checklist: Patient identified, Emergency Drugs available, Suction available and Patient being monitored Patient Re-evaluated:Patient Re-evaluated prior to induction Oxygen Delivery Method: Circle system utilized Preoxygenation: Pre-oxygenation with 100% oxygen Induction Type: IV induction Ventilation: Mask ventilation without difficulty Laryngoscope Size: Miller and 2 Grade View: Grade II Tube type: Oral Tube size: 7.5 mm Number of attempts: 1 Airway Equipment and Method: Stylet and Oral airway Placement Confirmation: ETT inserted through vocal cords under direct vision,  positive ETCO2 and breath sounds checked- equal and bilateral Secured at: 24 cm Tube secured with: Tape Dental Injury: Teeth and Oropharynx as per pre-operative assessment

## 2020-10-24 NOTE — Discharge Instructions (Signed)
Post procedure care instructions No driving for 4 days. No lifting over 5 lbs for 1 week. No vigorous or sexual activity for 1 week. You may return to work/your usual activities on 10/31/2020. Keep procedure site clean & dry. If you notice increased pain, swelling, bleeding or pus, call/return!  You may shower, but no soaking baths/hot tubs/pools for 1 week.      Cardiac Ablation, Care After  This sheet gives you information about how to care for yourself after your procedure. Your health care provider may also give you more specific instructions. If you have problems or questions, contact your health care provider. What can I expect after the procedure? After the procedure, it is common to have:  Bruising around your puncture site.  Tenderness around your puncture site.  Skipped heartbeats.  Tiredness (fatigue).  Follow these instructions at home: Puncture site care   Follow instructions from your health care provider about how to take care of your puncture site. Make sure you: ? If present, leave stitches (sutures), skin glue, or adhesive strips in place. These skin closures may need to stay in place for up to 2 weeks. If adhesive strip edges start to loosen and curl up, you may trim the loose edges. Do not remove adhesive strips completely unless your health care provider tells you to do that. ? If a large square bandage is present, this may be removed 24 hours after surgery.   Check your puncture site every day for signs of infection. Check for: ? Redness, swelling, or pain. ? Fluid or blood. If your puncture site starts to bleed, lie down on your back, apply firm pressure to the area, and contact your health care provider. ? Warmth. ? Pus or a bad smell. Driving  Do not drive for at least 4 days after your procedure or however long your health care provider recommends. (Do not resume driving if you have previously been instructed not to drive for other health reasons.)  Do not  drive or use heavy machinery while taking prescription pain medicine. Activity  Avoid activities that take a lot of effort for at least 7 days after your procedure.  Do not lift anything that is heavier than 5 lb (4.5 kg) for one week.   No sexual activity for 1 week.   Return to your normal activities as told by your health care provider. Ask your health care provider what activities are safe for you. General instructions  Take over-the-counter and prescription medicines only as told by your health care provider.  Do not use any products that contain nicotine or tobacco, such as cigarettes and e-cigarettes. If you need help quitting, ask your health care provider.  You may shower after 24 hours, but Do not take baths, swim, or use a hot tub for 1 week.   Do not drink alcohol for 24 hours after your procedure.  Keep all follow-up visits as told by your health care provider. This is important. Contact a health care provider if:  You have redness, mild swelling, or pain around your puncture site.  You have fluid or blood coming from your puncture site that stops after applying firm pressure to the area.  Your puncture site feels warm to the touch.  You have pus or a bad smell coming from your puncture site.  You have a fever.  You have chest pain or discomfort that spreads to your neck, jaw, or arm.  You are sweating a lot.  You feel nauseous.  You have a fast or irregular heartbeat.  You have shortness of breath.  You are dizzy or light-headed and feel the need to lie down.  You have pain or numbness in the arm or leg closest to your puncture site. Get help right away if:  Your puncture site suddenly swells.  Your puncture site is bleeding and the bleeding does not stop after applying firm pressure to the area. These symptoms may represent a serious problem that is an emergency. Do not wait to see if the symptoms will go away. Get medical help right away. Call your  local emergency services (911 in the [place] Do not drive yourself to the hospital. Summary  After the procedure, it is normal to have bruising and tenderness at the puncture site in your groin, neck, or forearm.  Check your puncture site every day for signs of infection.  Get help right away if your puncture site is bleeding and the bleeding does not stop after applying firm pressure to the area. This is a medical emergency. This information is not intended to replace advice given to you by your health care provider. Make sure you discuss any questions you have with your health care provider.

## 2020-10-24 NOTE — Transfer of Care (Signed)
Immediate Anesthesia Transfer of Care Note  Patient: Ronnie Vazquez  Procedure(s) Performed: A-FLUTTER ABLATION (N/A )  Patient Location: Cath Lab  Anesthesia Type:General  Level of Consciousness: drowsy and patient cooperative  Airway & Oxygen Therapy: Patient Spontanous Breathing and Patient connected to nasal cannula oxygen  Post-op Assessment: Report given to RN and Post -op Vital signs reviewed and stable  Post vital signs: Reviewed and stable  Last Vitals:  Vitals Value Taken Time  BP    Temp    Pulse 77 10/24/20 0935  Resp 16 10/24/20 0935  SpO2 97 % 10/24/20 0935  Vitals shown include unvalidated device data.  Last Pain:  Vitals:   10/24/20 0627  TempSrc:   PainSc: 0-No pain         Complications: No complications documented.

## 2020-10-27 ENCOUNTER — Other Ambulatory Visit: Payer: Self-pay

## 2020-10-27 ENCOUNTER — Telehealth: Payer: Self-pay | Admitting: Internal Medicine

## 2020-10-27 ENCOUNTER — Other Ambulatory Visit: Payer: Medicare Other

## 2020-10-27 ENCOUNTER — Encounter (HOSPITAL_COMMUNITY): Payer: Self-pay | Admitting: Internal Medicine

## 2020-10-27 DIAGNOSIS — W57XXXS Bitten or stung by nonvenomous insect and other nonvenomous arthropods, sequela: Secondary | ICD-10-CM

## 2020-10-27 DIAGNOSIS — R350 Frequency of micturition: Secondary | ICD-10-CM | POA: Diagnosis not present

## 2020-10-27 MED FILL — Lidocaine HCl Local Preservative Free (PF) Inj 1%: INTRAMUSCULAR | Qty: 30 | Status: AC

## 2020-10-27 MED FILL — Bupivacaine HCl Preservative Free (PF) Inj 0.25%: INTRAMUSCULAR | Qty: 30 | Status: AC

## 2020-10-27 NOTE — Telephone Encounter (Signed)
Pt calling with 2 different questions about his Eliquis.In regards to cost I will send this message to both the provider who prescribed Eliquis as well as to our assistance prgm nurse Jeani Hawking. In regards to his question about how he is supposed to take Eliquis I will send to the triage.

## 2020-10-27 NOTE — Telephone Encounter (Signed)
Patient calling the office for samples of medication:   1.  What medication and dosage are you requesting samples for?apixaban (ELIQUIS) 5 MG TABS tablet [[id number]]    2.  Are you currently out of this medication? He stated this med is going to cost him $400.00 .  He would like to know if there as any samples of this med he might beable to get ?  He also would like to speak to the nurse about this on this med , he just had surgery and sure about how or when to take this med  Best number [phone]

## 2020-10-27 NOTE — Anesthesia Postprocedure Evaluation (Signed)
Anesthesia Post Note  Patient: Ronnie Vazquez  Procedure(s) Performed: A-FLUTTER ABLATION (N/A )     Patient location during evaluation: PACU Anesthesia Type: General Level of consciousness: awake and alert Pain management: pain level controlled Vital Signs Assessment: post-procedure vital signs reviewed and stable Respiratory status: spontaneous breathing, nonlabored ventilation, respiratory function stable and patient connected to nasal cannula oxygen Cardiovascular status: blood pressure returned to baseline and stable Postop Assessment: no apparent nausea or vomiting Anesthetic complications: no   No complications documented.  Last Vitals:  Vitals:   10/24/20 1300 10/24/20 1400  BP: 119/81 102/79  Pulse: 92 83  Resp: 19 (!) 21  Temp:    SpO2: 95% 95%    Last Pain:  Vitals:   10/24/20 1032  TempSrc:   PainSc: 0-No pain                 Effie Berkshire

## 2020-10-27 NOTE — Telephone Encounter (Signed)
Called and spoke to patient. He states that he is almost out of Eliquis. He is asking if he should continue taking the eliquis. He just had an aflutter ablation. Made patient aware that he needs to continue taking eliquis until until he is instructed otherwise by Dr. Caryl Comes. He states that he cannot afford the Eliquis at this time. Made patient aware that I will leave 2 week supply of samples up front for him along with the number to call for patient assistance. He verbalized understanding and thanked me for the call.

## 2020-10-28 NOTE — Telephone Encounter (Signed)
I s/w Cathy C. CMA in our refills dept to see what the protocol is for Eliquis samples. I informed Cath of the message sent from [facility] to see if we can give enough samples to carry pt to his appt with Dr. Caryl Comes 11/25/20. I informed Tye [place] as well [place] RN s/w the pt yesterday and put 2 weeks of samples of Eliquis at the front desk for the pt to pick up. I also stated that I had sent this note from yesterday to Wonda Horner. LPN our medication prgm nurse to see if the pt may qualify for the asst. prgm for Eliquis. Per Tye Maryland, protocol is we can only give out 2 weeks at a time. I stated that I will let Richardson Dopp, PAC know. I will send to Trinidad Curet, [place] as well for further follow up if needed.

## 2020-10-28 NOTE — Telephone Encounter (Signed)
Agree.  He needs to remain on anticoagulation.   If we are able to provide him samples until his appt w/ Dr. Caryl Comes 11/30, that would be great. Richardson Dopp, PA-C    10/28/2020 7:31 AM

## 2020-10-28 NOTE — Telephone Encounter (Signed)
**Note De-Identified Dineen Conradt Obfuscation** The pt states that he has a unmet deductible and that is why the cost of his Eliquis is so expensive. Since he is not sure if he is going to remain on Eliquis since he is status post aflutter ablation and is in NSR currently. He does have an appointment scheduled on 11/30 with Dt Caryl Comes when this will be determined.  I advised him that we can only give him 2 bottles of Eliquis samples at time and that when he gets down to the last couple days of these samples to call us and if we can we will provide him more.  I also gave him [employer]'s phone number to call to ask questions concerning his eligibility to be approved for their program. He is aware that if it seems that he would be approved for asst with them to ask them to mail him an application to his home and that once he receives it to complete his part of the application, obtain required documents per [employer], and to bring all to Dr Aquilla Hacker office to drop off and that we will handle the provider page and we will fax all to [employer].  He verbalized understanding and thanked me for calling him to discuss.

## 2020-10-28 NOTE — Telephone Encounter (Signed)
Thank you! Richardson Dopp, PA-C    10/28/2020 9:01 PM

## 2020-10-29 ENCOUNTER — Telehealth: Payer: Self-pay | Admitting: Gastroenterology

## 2020-10-29 NOTE — Telephone Encounter (Signed)
I will send this to Trinidad Curet, [place] as well as to Dr. Olin Pia nurse as Juluis Rainier.

## 2020-10-29 NOTE — Telephone Encounter (Signed)
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 Full report will be scanned in. No evidence of achalasia. Please advise him to follow anti reflux measures and eat/drink slowly. Continue Protonix. No further work up is indicated at this point. Thanks

## 2020-10-29 NOTE — Telephone Encounter (Signed)
Dr. Fuller Plan and Silverio Decamp, have you seen the results of the manometry?

## 2020-10-30 DIAGNOSIS — Z23 Encounter for immunization: Secondary | ICD-10-CM | POA: Diagnosis not present

## 2020-10-30 NOTE — Telephone Encounter (Signed)
Patient notified of the results and recommendations 

## 2020-11-04 LAB — LYME DISEASE ANTIBODY (IGG), IMMUNOBLOT
B burgdorferi IgG Abs (IB): NEGATIVE
Lyme Disease 18 kD IgG: NONREACTIVE
Lyme Disease 23 kD IgG: NONREACTIVE
Lyme Disease 28 kD IgG: NONREACTIVE
Lyme Disease 30 kD IgG: NONREACTIVE
Lyme Disease 39 kD IgG: NONREACTIVE
Lyme Disease 41 kD IgG: REACTIVE — AB
Lyme Disease 45 kD IgG: NONREACTIVE
Lyme Disease 58 kD IgG: NONREACTIVE
Lyme Disease 66 kD IgG: NONREACTIVE
Lyme Disease 93 kD IgG: NONREACTIVE

## 2020-11-04 LAB — PSA: PSA: 1.49 ng/mL (ref <=4.0)

## 2020-11-05 ENCOUNTER — Encounter: Payer: Self-pay | Admitting: Family Medicine

## 2020-11-05 ENCOUNTER — Other Ambulatory Visit: Payer: Self-pay | Admitting: Family Medicine

## 2020-11-05 MED ORDER — DOXYCYCLINE HYCLATE 100 MG PO TABS: 100.0000 mg | ORAL_TABLET | Freq: Two times a day (BID) | ORAL | 0 refills | Status: AC

## 2020-11-14 DIAGNOSIS — R1319 Other dysphagia: Secondary | ICD-10-CM

## 2020-11-16 ENCOUNTER — Encounter: Payer: Self-pay | Admitting: Family Medicine

## 2020-11-25 ENCOUNTER — Other Ambulatory Visit: Payer: Self-pay

## 2020-11-25 ENCOUNTER — Ambulatory Visit (INDEPENDENT_AMBULATORY_CARE_PROVIDER_SITE_OTHER): Payer: Medicare Other | Admitting: Internal Medicine

## 2020-11-25 ENCOUNTER — Encounter: Payer: Self-pay | Admitting: Internal Medicine

## 2020-11-25 VITALS — BP 94/68 | HR 76 | Ht 70.0 in | Wt 177.0 lb

## 2020-11-25 DIAGNOSIS — I483 Typical atrial flutter: Secondary | ICD-10-CM

## 2020-11-25 DIAGNOSIS — I2510 Atherosclerotic heart disease of native coronary artery without angina pectoris: Secondary | ICD-10-CM | POA: Diagnosis not present

## 2020-11-25 NOTE — Progress Notes (Signed)
ek 

## 2020-11-25 NOTE — Patient Instructions (Signed)
Medication Instructions:  Your physician recommends that you continue on your current medications as directed. Please refer to the Current Medication list given to you today. *If you need a refill on your cardiac medications before your next appointment, please call your pharmacy*   Lab Work: None ordered.  If you have labs (blood work) drawn today and your tests are completely normal, you will receive your results only by: . MyChart Message (if you have MyChart) OR . A paper copy in the mail If you have any lab test that is abnormal or we need to change your treatment, we will call you to review the results.   Testing/Procedures: None ordered.    Follow-Up: At [facility], you and your health needs are our priority.  As part of our continuing mission to provide you with exceptional heart care, we have created designated Provider Care Teams.  These Care Teams include your primary Cardiologist (physician) and Advanced Practice Providers (APPs -  Physician Assistants and Nurse Practitioners) who all work together to provide you with the care you need, when you need it.  We recommend signing up for the patient portal called "MyChart".  Sign up information is provided on this After Visit Summary.  MyChart is used to connect with patients for Virtual Visits (Telemedicine).  Patients are able to view lab/test results, encounter notes, upcoming appointments, etc.  Non-urgent messages can be sent to your provider as well.   To learn more about what you can do with MyChart, go to [email or web address].    Your next appointment:   As needed with Dr Klein  

## 2020-11-25 NOTE — Progress Notes (Signed)
Patient Care Team: Caren Macadam, MD as PCP - General (Family Medicine) Sherren Mocha, MD as PCP - Cardiology (Cardiology)   HPI  Ronnie Vazquez is a 74 y.o. male Seen in follow-up for atrial flutter for which he underwent catheter ablation 10/21.  History of coronary disease with complex PCI 9/19.  DATE TEST EF   8/14 Echo   65 %   8/19 LHC  55-60* % LADp-90/70>>stent  8/20 Myoview  60% No ischemia   Date Cr K Hgb  2/21 1.34 4.7 15.3          Thromboembolic risk factors ( age -49, TIA/CVA-2 (no clear consensus, Vasc disease -1) for a CHADSVASc Score of >=2-4     Records and Results Reviewed   Past Medical History:  Diagnosis Date  . Arthritis    toe  . Atrial flutter ([place])    Echo 9/21: EF 60-65, no RWMA, normal RVSF, trivial MR, borderline dilation of aorta at sinus of Valsalva (37 mm), mild LAE, RVSP 26.8  . Chicken pox    hx  . Coronary artery disease    08/29/18 PCI/DES with bifurcation from LAD into 1st diag.   Marland Kitchen GERD (gastroesophageal reflux disease)    with certain foods/on OTC meds  . Hyperlipidemia    on meds  . Left shoulder pain 02/02/2013  . Microscopic hematuria 02/02/2013  . Vision loss of left eye     Past Surgical History:  Procedure Laterality Date  . A-FLUTTER ABLATION N/A 10/24/2020   Procedure: A-FLUTTER ABLATION;  Surgeon: Deboraha Sprang, MD;  Location: [place] [facility];  Service: Cardiovascular;  Laterality: N/A;  . COLONOSCOPY    . CORONARY BALLOON ANGIOPLASTY N/A 08/29/2018   Procedure: CORONARY BALLOON ANGIOPLASTY;  Surgeon: Sherren Mocha, MD;  Location: [place] [facility];  Service: Cardiovascular;  Laterality: N/A;  . CORONARY STENT INTERVENTION N/A 08/29/2018   Procedure: CORONARY STENT INTERVENTION;  Surgeon: Sherren Mocha, MD;  Location: [place] [facility];  Service: Cardiovascular;  Laterality: N/A;  . ESOPHAGEAL MANOMETRY N/A 10/08/2020   Procedure: ESOPHAGEAL MANOMETRY (EM);  Surgeon: Ladene Artist,  MD;  Location: [facility];  Service: Endoscopy;  Laterality: N/A;  . LEFT HEART CATH AND CORONARY ANGIOGRAPHY N/A 08/08/2018   Procedure: LEFT HEART CATH AND CORONARY ANGIOGRAPHY;  Surgeon: Lorretta Harp, MD;  Location: [place] [facility];  Service: Cardiovascular;  Laterality: N/A;  . TONSILLECTOMY AND ADENOIDECTOMY  1959  . WISDOM TOOTH EXTRACTION  1975    Current Meds  Medication Sig  . apixaban (ELIQUIS) 5 MG TABS tablet Take 1 tablet (5 mg total) by mouth 2 (two) times daily.  Marland Kitchen aspirin EC 81 MG tablet Take 81 mg by mouth. Only takes occasionally Swallow whole.  . Cyanocobalamin (VITAMIN B-12 PO) Take 1 tablet by mouth daily.  . diclofenac Sodium (VOLTAREN) 1 % GEL Apply 1 application topically 4 (four) times daily as needed (arthritis pain.).  Marland Kitchen ezetimibe (ZETIA) 10 MG tablet TAKE 1 TABLET BY MOUTH EVERY DAY  . metoprolol succinate (TOPROL XL) 25 MG 24 hr tablet Take 0.5 tablets (12.5 mg total) by mouth daily.  . naproxen (NAPROSYN) 500 MG tablet Take 1 tablet (500 mg total) by mouth 2 (two) times daily as needed.  . nitroGLYCERIN (NITROSTAT) 0.4 MG SL tablet Place 1 tablet (0.4 mg total) under the tongue every 5 (five) minutes as needed for chest pain.  . pantoprazole (PROTONIX) 40 MG tablet Take 40 mg by  mouth daily.    Allergies  Allergen Reactions  . Repatha [Evolocumab] Other (See Comments)    Myalgias  . Bempedoic Acid Other (See Comments)    (Nexletol) muscle pain.  Marland Kitchen Crestor [Rosuvastatin Calcium] Other (See Comments)    Myalgias (muscle/joint pain)      Review of Systems negative except from HPI and PMH  Physical Exam BP 94/68   Pulse 76   Ht 5\' 10"  (1.778 m)   Wt 177 lb (80.3 kg)   SpO2 97%   BMI 25.40 kg/m  Well developed and nourished in no acute distress HENT normal Neck supple with JVP-  flat   Clear Regular rate and rhythm, no murmurs or gallops Abd-soft with active BS No Clubbing cyanosis edema Skin-warm and dry A & Oriented  Grossly normal  sensory and motor function  ECG  Sinus @ 66 16/09/42    CrCl cannot be calculated (Patient's most recent lab result is older than the maximum 21 days allowed.).   Assessment and  Plan  Aflutter s/p ablation   CAD s/p Stenting   Will stop anticoagulation and metoprolol  Resume ASA  Followup prn  Discussed the moderate likelihood of developing atrial fib and that restriction in ETOH intake can have a beneficial impact on afib occurrences     Current medicines are reviewed at length with the patient today .  The patient does not  have concerns regarding medicines.

## 2020-12-01 DIAGNOSIS — R1312 Dysphagia, oropharyngeal phase: Secondary | ICD-10-CM | POA: Diagnosis not present

## 2020-12-01 DIAGNOSIS — R633 Feeding difficulties, unspecified: Secondary | ICD-10-CM | POA: Diagnosis not present

## 2020-12-03 DIAGNOSIS — Z888 Allergy status to other drugs, medicaments and biological substances status: Secondary | ICD-10-CM | POA: Diagnosis not present

## 2020-12-03 DIAGNOSIS — K224 Dyskinesia of esophagus: Secondary | ICD-10-CM | POA: Diagnosis not present

## 2020-12-03 DIAGNOSIS — K219 Gastro-esophageal reflux disease without esophagitis: Secondary | ICD-10-CM | POA: Diagnosis not present

## 2020-12-12 ENCOUNTER — Telehealth: Payer: Self-pay | Admitting: *Deleted

## 2020-12-12 DIAGNOSIS — K219 Gastro-esophageal reflux disease without esophagitis: Secondary | ICD-10-CM

## 2020-12-12 NOTE — Telephone Encounter (Signed)
See typed letter from the pt which was reviewed and detailed info included by PCP.

## 2020-12-24 NOTE — Telephone Encounter (Signed)
We can discuss at upcoming visit.

## 2021-01-08 ENCOUNTER — Other Ambulatory Visit: Payer: Self-pay

## 2021-01-08 ENCOUNTER — Telehealth: Payer: Self-pay | Admitting: Internal Medicine

## 2021-01-08 MED ORDER — NITROGLYCERIN 0.4 MG SL SUBL: 0.4000 mg | SUBLINGUAL_TABLET | SUBLINGUAL | 6 refills | Status: DC | PRN

## 2021-01-08 NOTE — Telephone Encounter (Signed)
P 

## 2021-01-08 NOTE — Telephone Encounter (Signed)
Pt's medication was sent to pt's pharmacy as requested. Confirmation received.  °

## 2021-01-15 ENCOUNTER — Ambulatory Visit: Payer: Medicare Other | Admitting: Cardiovascular Disease

## 2021-01-16 ENCOUNTER — Encounter: Payer: Self-pay | Admitting: Cardiovascular Disease

## 2021-01-16 ENCOUNTER — Other Ambulatory Visit: Payer: Self-pay

## 2021-01-16 ENCOUNTER — Ambulatory Visit (INDEPENDENT_AMBULATORY_CARE_PROVIDER_SITE_OTHER): Payer: Medicare Other | Admitting: Cardiovascular Disease

## 2021-01-16 VITALS — BP 108/70 | HR 65 | Ht 70.0 in | Wt 176.0 lb

## 2021-01-16 DIAGNOSIS — E782 Mixed hyperlipidemia: Secondary | ICD-10-CM | POA: Diagnosis not present

## 2021-01-16 DIAGNOSIS — I2510 Atherosclerotic heart disease of native coronary artery without angina pectoris: Secondary | ICD-10-CM

## 2021-01-16 MED ORDER — PRAVASTATIN SODIUM 10 MG PO TABS: 10.0000 mg | ORAL_TABLET | ORAL | 3 refills | Status: DC

## 2021-01-16 MED ORDER — CO Q 10 100 MG PO CAPS: 200.0000 mg | ORAL_CAPSULE | Freq: Every day | ORAL | Status: DC

## 2021-01-16 NOTE — Progress Notes (Signed)
Cardiology Office Note:    Date:  01/16/2021   ID:  Ronnie Vazquez, DOB [birth year], MRN [id number]  PCP:  Ronnie Macadam, MD  [facility] Cardiologist:  Ronnie Mocha, MD  [place] Electrophysiologist:  None   Referring MD: Ronnie Macadam, MD   Chief Complaint  Patient presents with  . Coronary Artery Disease    History of Present Illness:    Ronnie Vazquez is a 75 y.o. male with a hx of coronary artery disease with history of LAD PCI in 2019. At the time he described typical class II exertional angina. He has had no recurrence since then. The patient has been intolerant to multiple medications over time. He is able to tolerate low-dose aspirin and ezetimibe for lipid lowering.  He is here alone today. Doing well at present with no exertional chest pain or dyspnea. No palpitations, edema, orthopnea, PND, lightheadedness, or syncope. He does have intermittent chest discomfort and some associated dysphagia. None of these symptoms are related to physical activity.  Past Medical History:  Diagnosis Date  . Arthritis    toe  . Atrial flutter ([place])    Echo 9/21: EF 60-65, no RWMA, normal RVSF, trivial MR, borderline dilation of aorta at sinus of Valsalva (37 mm), mild LAE, RVSP 26.8  . Chicken pox    hx  . Coronary artery disease    08/29/18 PCI/DES with bifurcation from LAD into 1st diag.   Marland Kitchen GERD (gastroesophageal reflux disease)    with certain foods/on OTC meds  . Hyperlipidemia    on meds  . Left shoulder pain 02/02/2013  . Microscopic hematuria 02/02/2013  . Vision loss of left eye     Past Surgical History:  Procedure Laterality Date  . A-FLUTTER ABLATION N/A 10/24/2020   Procedure: A-FLUTTER ABLATION;  Surgeon: Ronnie Sprang, MD;  Location: [place] [facility];  Service: Cardiovascular;  Laterality: N/A;  . COLONOSCOPY    . CORONARY BALLOON ANGIOPLASTY N/A 08/29/2018   Procedure: CORONARY BALLOON ANGIOPLASTY;  Surgeon: Ronnie Mocha, MD;  Location: [place] [facility];  Service: Cardiovascular;  Laterality: N/A;  . CORONARY STENT INTERVENTION N/A 08/29/2018   Procedure: CORONARY STENT INTERVENTION;  Surgeon: Ronnie Mocha, MD;  Location: [place] [facility];  Service: Cardiovascular;  Laterality: N/A;  . ESOPHAGEAL MANOMETRY N/A 10/08/2020   Procedure: ESOPHAGEAL MANOMETRY (EM);  Surgeon: Ronnie Artist, MD;  [facility];  Service: Endoscopy;  Laterality: N/A;  . LEFT HEART CATH AND CORONARY ANGIOGRAPHY N/A 08/08/2018   Procedure: LEFT HEART CATH AND CORONARY ANGIOGRAPHY;  Surgeon: Ronnie Harp, MD;  Location: [place] [facility];  Service: Cardiovascular;  Laterality: N/A;  . TONSILLECTOMY AND ADENOIDECTOMY  1959  . WISDOM TOOTH EXTRACTION  1975    Current Medications: Current Meds  Medication Sig  . aspirin EC 81 MG tablet Take 81 mg by mouth. Only takes occasionally Swallow whole.  . Coenzyme Q10 (CO Q 10) 100 MG CAPS Take 200 mg by mouth daily.  . Cyanocobalamin (VITAMIN B-12 PO) Take 1 tablet by mouth daily.  Marland Kitchen ezetimibe (ZETIA) 10 MG tablet TAKE 1 TABLET BY MOUTH EVERY DAY  . nitroGLYCERIN (NITROSTAT) 0.4 MG SL tablet Place 1 tablet (0.4 mg total) under the tongue every 5 (five) minutes as needed for chest pain.  . pantoprazole (PROTONIX) 40 MG tablet Take 40 mg by mouth daily.  . pravastatin (PRAVACHOL) 10 MG tablet Take 1 tablet (10 mg total) by mouth 3 (three) times  a week.     Allergies:   Repatha [evolocumab], Bempedoic acid, and Crestor [rosuvastatin calcium]   Social History   Socioeconomic History  . Marital status: Married    Spouse name: Not on file  . Number of children: Not on file  . Years of education: Not on file  . Highest education level: Not on file  Occupational History  . Not on file  Tobacco Use  . Smoking status: Never Smoker  . Smokeless tobacco: Never Used  Vaping Use  . Vaping Use: Never used  Substance and Sexual Activity  . Alcohol use: Yes    Alcohol/week: 8.0 standard  drinks    Types: 8 Glasses of wine per week  . Drug use: No  . Sexual activity: Not on file  Other Topics Concern  . 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 There is no history of Colon cancer, Esophageal cancer, Pancreatic cancer, Rectal cancer, Stomach cancer, or Heart disease.  ROS:   Please see the history of present illness.    All other systems reviewed and are negative.  EKGs/Labs/Other Studies Reviewed:    The following studies were reviewed today: Echo 09/12/2020: IMPRESSIONS    1. Left ventricular ejection fraction, by estimation, is 60 to 65%. The  left ventricle has normal function. The left ventricle has no regional  wall motion abnormalities. Indeterminant diastolic function due to atrial  flutter.  2. Right ventricular systolic function is normal. The right ventricular  size is normal. There is normal pulmonary artery systolic pressure.  3. The mitral valve is normal in structure. Trivial mitral valve  regurgitation. No evidence of mitral stenosis.  4. The aortic valve is normal in structure. Aortic valve regurgitation is  not visualized. No aortic stenosis is present.  5. The inferior vena cava is normal in size with greater than 50%  respiratory variability, suggesting right atrial pressure of 3 mmHg.  6. Aortic dilatation noted. There is borderline dilatation at the level  of the sinuses  of Valsalva, measuring 37 mm.  7. Left atrial size was mildly dilated.   Myoview Scan 07-30-2019: Study Highlights   Nuclear stress EF: 60%. The left ventricular ejection fraction is normal (55-65%).  There was no ST segment deviation noted during stress.  This is a low risk study. No evidence of ischemia and no evidence of infarction  The study is normal.   EKG:  EKG is not ordered today.    Recent Labs: 02/20/2020: TSH 2.03 10/13/2020: ALT 20 10/21/2020: BUN 29; Creatinine, Ser 1.40; Hemoglobin 15.3; Platelets 270; Potassium 4.8; Sodium 140  Recent Lipid Panel    Component Value Date/Time   CHOL 172 10/13/2020 0854   CHOL 160 10/15/2019 0906   TRIG 134 10/13/2020 0854   HDL 41 10/13/2020 0854  HDL 49 10/15/2019 0906   CHOLHDL 4.2 10/13/2020 0854   VLDL 29.0 06/15/2019 0809   LDLCALC 106 (H) 10/13/2020 0854   LDLDIRECT 117.0 08/12/2017 0921     Risk Assessment/Calculations:      Physical Exam:    VS:  BP 108/70   Pulse 65   Ht 5\' 10"  (1.778 m)   Wt 176 lb (79.8 kg)   SpO2 99%   BMI 25.25 kg/m     Wt Readings from Last 3 Encounters:  01/16/21 176 lb (79.8 kg)  11/25/20 177 lb (80.3 kg)  10/24/20 166 lb (75.3 kg)     GEN:  Well nourished, well developed in no acute distress HEENT: Normal NECK: No JVD; No carotid bruits LYMPHATICS: No lymphadenopathy CARDIAC: RRR, no murmurs, rubs, gallops RESPIRATORY:  Clear to auscultation without rales, wheezing or rhonchi  ABDOMEN: Soft, non-tender, non-distended MUSCULOSKELETAL:  No edema; No deformity  SKIN: Warm and dry NEUROLOGIC:  Alert and oriented x 3 PSYCHIATRIC:  Normal affect   ASSESSMENT:    1. Coronary artery disease involving native coronary artery of native heart without angina pectoris   2. Mixed hyperlipidemia    PLAN:    In order of problems listed above:  1. No recurrence of exertional angina since PCI was performed in 2019. He remains on aspirin for antiplatelet therapy. See below for  discussion of lipids. He does have atypical chest pain associated with dysphagia that sounds clearly related to GI etiology. Might consider stress testing when I see him back next year. 2. Intolerant to multiple statin drugs as well as PCSK9 inhibitors. Discussed options at length today. Reviewed most recent lipid panel. Currently taking ezetimibe. He agrees to trial of coenzyme Q10 plus low-dose pravastatin 10 mg 3 days/week. Repeat lipids and LFTs in 3 months. He follows an excellent diet/lifestyle.   Medication Adjustments/Labs and Tests Ordered: Current medicines are reviewed at length with the patient today.  Concerns regarding medicines are outlined above.  Orders Placed This Encounter  Procedures  . Lipid panel  . Hepatic function panel   Meds ordered this encounter  Medications  . pravastatin (PRAVACHOL) 10 MG tablet    Sig: Take 1 tablet (10 mg total) by mouth 3 (three) times a week.    Dispense:  36 tablet    Refill:  3  . Coenzyme Q10 (CO Q 10) 100 MG CAPS    Sig: Take 200 mg by mouth daily.    Dispense:  30 capsule    Patient Instructions  Medication Instructions:  1) START CoQ-10 200 mg daily 2) After you have been on the CoQ-10 for 2 weeks, START PRAVASTATIN 10 mg three days a week *If you need a refill on your cardiac medications before your next appointment, please call your pharmacy*  Lab Work: Your provider recommends that you return for FASTING lab work in 3 months If you have labs (blood work) drawn today and your tests are completely normal, you will receive your results only by: Marland Kitchen MyChart Message (if you have MyChart) OR . A paper copy in the mail If you have any lab test that is abnormal or we need to change your treatment, we will call you to review the results.  Follow-Up: At [facility], you and your health needs are our priority.  As part of our continuing mission to provide you with exceptional heart care, we have created designated Provider Care  Teams.  These Care Teams include your primary Cardiologist (physician) and Advanced Practice  Providers (APPs -  Physician Assistants and Nurse Practitioners) who all work together to provide you with the care you need, when you need it. Your next appointment:   12 month(s) The format for your next appointment:   In Person Provider:   You may see Ronnie Mocha, MD or one of the following Advanced Practice Providers on your designated Care Team:    Richardson Dopp, PA-C  Robbie Lis, [place]      Signed, Ronnie Mocha, MD  01/16/2021 5:10 PM    [place]

## 2021-01-16 NOTE — Patient Instructions (Signed)
Medication Instructions:  1) START CoQ-10 200 mg daily 2) After you have been on the CoQ-10 for 2 weeks, START PRAVASTATIN 10 mg three days a week *If you need a refill on your cardiac medications before your next appointment, please call your pharmacy*  Lab Work: Your provider recommends that you return for FASTING lab work in 3 months If you have labs (blood work) drawn today and your tests are completely normal, you will receive your results only by:  [employer] (if you have MyChart) OR  A paper copy in the mail If you have any lab test that is abnormal or we need to change your treatment, we will call you to review the results.  Follow-Up: At [facility], you and your health needs are our priority.  As part of our continuing mission to provide you with exceptional heart care, we have created designated Provider Care Teams.  These Care Teams include your primary Cardiologist (physician) and Advanced Practice Providers (APPs -  Physician Assistants and Nurse Practitioners) who all work together to provide you with the care you need, when you need it. Your next appointment:   12 month(s) The format for your next appointment:   In Person Provider:   You may see Sherren Mocha, MD or one of the following Advanced Practice Providers on your designated Care Team:    Richardson Dopp, PA-C  [place], [place]

## 2021-01-19 ENCOUNTER — Encounter: Payer: Self-pay | Admitting: Family Medicine

## 2021-01-19 ENCOUNTER — Ambulatory Visit (INDEPENDENT_AMBULATORY_CARE_PROVIDER_SITE_OTHER): Payer: Medicare Other | Admitting: Family Medicine

## 2021-01-19 ENCOUNTER — Other Ambulatory Visit: Payer: Self-pay

## 2021-01-19 VITALS — BP 100/70 | HR 48 | Temp 97.6°F | Ht 70.0 in | Wt 176.3 lb

## 2021-01-19 DIAGNOSIS — K219 Gastro-esophageal reflux disease without esophagitis: Secondary | ICD-10-CM

## 2021-01-19 DIAGNOSIS — R1319 Other dysphagia: Secondary | ICD-10-CM | POA: Diagnosis not present

## 2021-01-19 DIAGNOSIS — B353 Tinea pedis: Secondary | ICD-10-CM

## 2021-01-19 DIAGNOSIS — E7800 Pure hypercholesterolemia, unspecified: Secondary | ICD-10-CM

## 2021-01-19 MED ORDER — CLOTRIMAZOLE 1 % EX CREA: 1.0000 "application " | TOPICAL_CREAM | Freq: Two times a day (BID) | CUTANEOUS | 0 refills | Status: DC

## 2021-01-19 MED ORDER — TRIAMCINOLONE ACETONIDE 0.1 % EX CREA: 1.0000 "application " | TOPICAL_CREAM | Freq: Two times a day (BID) | CUTANEOUS | 0 refills | Status: DC

## 2021-01-19 MED ORDER — CLOTRIMAZOLE 1 % EX CREA: 1.0000 "application " | TOPICAL_CREAM | Freq: Two times a day (BID) | CUTANEOUS | 2 refills | Status: DC

## 2021-01-19 NOTE — Patient Instructions (Addendum)
Ronnie Blanks, DO (Attending) [phone] (Work) [phone] [facility]) Ronnie Vazquez, [place] [address] Otolaryngology  *who second opinion would she recommend? Still having sensation of fullness in throat. Any other options for treatment/therapy? Let her know that you called the swallow association - and they recommended [facility]. Would this be good next step.   Then just let me know what she says. If you cannot get a hold of her I will call myself.   *use the clotrimazole twice daily x 2 weeks on the heel/left foot. At night; use the triamcinolone on opt of this and then coat with vasoline, wrap with syran wrap and then put on sock. Let me know if not better in 2 weeks.

## 2021-01-19 NOTE — Progress Notes (Signed)
Ronnie Vazquez DOB: October 16, 1946 Encounter date: 01/19/2021  This is a 75 y.o. male who presents with Chief Complaint  Patient presents with  . Results    Patient request to discuss endoscopy results from Dr. Fuller Plan    History of present illness: Ineffective esophageal motility (IEM) - what you have is defined as minor esophageal motility disorder, is also the most common esophageal motility disorder.  Had seen Dr. Rowe Clack last on 12/8.  She did offer referral for second opinion.   Recently cardiology started pravastatin for him. He had little sensation in chest - felt more like he was getting cold - some phlegm in throat.   Feels like swallowing issues are the same. Feels like something is stuck in throat a lot; usually after most meals. Worries about what this might mean or problems down the road.   Wants to know what he can do that will make this better. Tried to see nutrition but not covered. Not having any sx of heartburn. Never with any burning/burping. He was nervous about staying on the protonix. Would like to be off of this medication.   When he was getting scan done [facility] he states that they saw narrowing   Was trying to eat soft foods to push down "bolus" when feeling fullness in throat. Sensation in throat is there all the time. If he swallows he feels it. Not painful, just annoying. No choking, no vomiting. No abdominal pain. Bowels are doing well.    Allergies  Allergen Reactions  . Repatha [Evolocumab] Other (See Comments)    Myalgias  . Bempedoic Acid Other (See Comments)    (Nexletol) muscle pain.  Marland Kitchen Crestor [Rosuvastatin Calcium] Other (See Comments)    Myalgias (muscle/joint pain)   Current Meds  Medication Sig  . aspirin EC 81 MG tablet Take 81 mg by mouth. Only takes occasionally Swallow whole.  . Coenzyme Q10 (CO Q 10) 100 MG CAPS Take 200 mg by mouth daily.  . Cyanocobalamin (VITAMIN B-12 PO) Take 1 tablet by mouth daily.  Marland Kitchen ezetimibe (ZETIA) 10 MG tablet  TAKE 1 TABLET BY MOUTH EVERY DAY  . nitroGLYCERIN (NITROSTAT) 0.4 MG SL tablet Place 1 tablet (0.4 mg total) under the tongue every 5 (five) minutes as needed for chest pain.  . pantoprazole (PROTONIX) 40 MG tablet Take 40 mg by mouth daily.  . pravastatin (PRAVACHOL) 10 MG tablet Take 1 tablet (10 mg total) by mouth 3 (three) times a week.  . triamcinolone (KENALOG) 0.1 % Apply 1 application topically 2 (two) times daily.  . [DISCONTINUED] clotrimazole (ANTIFUNGAL, CLOTRIMAZOLE,) 1 % cream Apply 1 application topically 2 (two) times daily.    Review of Systems  Constitutional: Negative for chills, fatigue and fever.  HENT: Positive for trouble swallowing. Negative for sinus pressure, sinus pain and sore throat.   Respiratory: Negative for cough, chest tightness, shortness of breath and wheezing.   Cardiovascular: Negative for chest pain, palpitations and leg swelling.    Objective:  BP 100/70 (BP Location: Right Arm, Patient Position: Sitting, Cuff Size: Normal)   Pulse (!) 48   Temp 97.6 F (36.4 C) (Oral)   Ht 5\' 10"  (1.778 m)   Wt 176 lb 4.8 oz (80 kg)   SpO2 98%   BMI 25.30 kg/m   Weight: 176 lb 4.8 oz (80 kg)   BP Readings from Last 3 Encounters:  01/19/21 100/70  01/16/21 108/70  11/25/20 94/68   Wt Readings from Last 3 Encounters:  01/19/21  176 lb 4.8 oz (80 kg)  01/16/21 176 lb (79.8 kg)  11/25/20 177 lb (80.3 kg)    Physical Exam Constitutional:      General: He is not in acute distress.    Appearance: He is well-developed.  Cardiovascular:     Rate and Rhythm: Normal rate and regular rhythm.     Heart sounds: Normal heart sounds. No murmur heard. No friction rub.  Pulmonary:     Effort: Pulmonary effort is normal. No respiratory distress.     Breath sounds: Normal breath sounds. No wheezing or rales.  Musculoskeletal:     Right lower leg: No edema.     Left lower leg: No edema.  Feet:     Comments: Left foot scaling, dry, heel cracking. Neurological:      Mental Status: He is alert and oriented to person, place, and time.  Psychiatric:        Behavior: Behavior normal.     Assessment/Plan  1. Gastroesophageal reflux disease, unspecified whether esophagitis present Have discussed benefits at this point>risks with medication.  Continue Protonix 40 mg daily  2. Esophageal dysphagia I have encouraged him to reach out to a specialist that he saw over a week as she did offer a second opinion if desired.  He has on his own reached out to the [employer] and got a number from them for speech pathology over at [facility].  I encouraged him to follow-up on this because I do think some active therapy would be helpful for him.  Continued issue with swallowing is causing distress.  I have asked him to update me after contacting specialist to let me know if anything further is needed from my point to get him an appointment.  3. Pure hypercholesterolemia Has been well controlled with Zetia 10 mg daily and pravastatin 10 mg 3 days a week.  4. Tinea pedis of left foot Advised using clotrimazole in combination with Kenalog with petroleum based ointment covering this (at night) to hopefully he will crack and treat fungal infection.  Let me know if not improving after 2 to 3 weeks of treatment. - triamcinolone (KENALOG) 0.1 %; Apply 1 application topically 2 (two) times daily.  Dispense: 30 g; Refill: 0 - clotrimazole (ANTIFUNGAL, CLOTRIMAZOLE,) 1 % cream; Apply 1 application topically 2 (two) times daily.  Dispense: 30 g; Refill: 2    Return if symptoms worsen or fail to improve, for pending patient update/referral to speech path. 42 minutes spent in chart review, discussion with patient regarding referral/follow up options, review of previous studies/evaluation, exam, charting.    Micheline Rough, MD

## 2021-01-20 ENCOUNTER — Encounter: Payer: Self-pay | Admitting: Family Medicine

## 2021-01-20 MED ORDER — CLOTRIMAZOLE 1 % EX CREA: 1.0000 "application " | TOPICAL_CREAM | Freq: Two times a day (BID) | CUTANEOUS | 2 refills | Status: DC

## 2021-02-04 ENCOUNTER — Encounter: Payer: Self-pay | Admitting: Gastroenterology

## 2021-02-04 ENCOUNTER — Telehealth: Payer: Self-pay | Admitting: Family Medicine

## 2021-02-04 NOTE — Telephone Encounter (Signed)
Patient states Dr. Ethlyn Gallery told him on Feb 3rd on MyChart that she was sending his referral to [facility], however [facility] states they don't have the referral so he can't be seen.  Patient is requesting a call back.

## 2021-02-09 NOTE — Telephone Encounter (Signed)
Form refaxed to [phone].

## 2021-02-09 NOTE — Telephone Encounter (Signed)
Spoke with the pt and informed him of the message below.  Patient is aware the form was refaxed to [phone].

## 2021-02-09 NOTE — Telephone Encounter (Signed)
The patient called to verify the time that the fax was sent to let [facility] know that it has been re faxed.  The patient also verified the fax number that was given in previous note.

## 2021-02-09 NOTE — Telephone Encounter (Signed)
Form was faxed on 2/4 and has been scanned into the pts chart.  I left a message for the pt to return my call.

## 2021-02-10 NOTE — Telephone Encounter (Signed)
Per pts request the form was refaxed to [phone].

## 2021-03-02 ENCOUNTER — Other Ambulatory Visit: Payer: Self-pay

## 2021-03-02 ENCOUNTER — Ambulatory Visit (INDEPENDENT_AMBULATORY_CARE_PROVIDER_SITE_OTHER): Payer: Medicare Other | Admitting: Otolaryngology

## 2021-03-02 VITALS — Temp 97.5°F

## 2021-03-02 DIAGNOSIS — I2510 Atherosclerotic heart disease of native coronary artery without angina pectoris: Secondary | ICD-10-CM | POA: Diagnosis not present

## 2021-03-02 DIAGNOSIS — J310 Chronic rhinitis: Secondary | ICD-10-CM

## 2021-03-02 DIAGNOSIS — H903 Sensorineural hearing loss, bilateral: Secondary | ICD-10-CM | POA: Diagnosis not present

## 2021-03-02 NOTE — Progress Notes (Signed)
HPI: Ronnie Vazquez is a 75 y.o. male who returns today for evaluation of ears.  He has had some congestion in the ears that seems to improve when he bends over.  He was concerned that he may have fluid in the ears.  He has used eardrops for wax in his ears.  He wears bilateral hearing aids. He is getting ready to go on a flight this Thursday..  Past Medical History:  Diagnosis Date  . Arthritis    toe  . Atrial flutter ([place])    Echo 9/21: EF 60-65, no RWMA, normal RVSF, trivial MR, borderline dilation of aorta at sinus of Valsalva (37 mm), mild LAE, RVSP 26.8  . Chicken pox    hx  . Coronary artery disease    08/29/18 PCI/DES with bifurcation from LAD into 1st diag.   Marland Kitchen GERD (gastroesophageal reflux disease)    with certain foods/on OTC meds  . Hyperlipidemia    on meds  . Left shoulder pain 02/02/2013  . Microscopic hematuria 02/02/2013  . Vision loss of left eye    Past Surgical History:  Procedure Laterality Date  . A-FLUTTER ABLATION N/A 10/24/2020   Procedure: A-FLUTTER ABLATION;  Surgeon: Deboraha Sprang, MD;  Location: [facility];  Service: Cardiovascular;  Laterality: N/A;  . COLONOSCOPY    . CORONARY BALLOON ANGIOPLASTY N/A 08/29/2018   Procedure: CORONARY BALLOON ANGIOPLASTY;  Surgeon: Sherren Mocha, MD;  Location: [facility];  Service: Cardiovascular;  Laterality: N/A;  . CORONARY STENT INTERVENTION N/A 08/29/2018   Procedure: CORONARY STENT INTERVENTION;  Surgeon: Sherren Mocha, MD;  Location: [facility];  Service: Cardiovascular;  Laterality: N/A;  . ESOPHAGEAL MANOMETRY N/A 10/08/2020   Procedure: ESOPHAGEAL MANOMETRY (EM);  Surgeon: Ladene Artist, MD;  Location: [facility];  Service: Endoscopy;  Laterality: N/A;  . LEFT HEART CATH AND CORONARY ANGIOGRAPHY N/A 08/08/2018   Procedure: LEFT HEART CATH AND CORONARY ANGIOGRAPHY;  Surgeon: Lorretta Harp, MD;  Location: [facility];  Service: Cardiovascular;  Laterality: N/A;  .  TONSILLECTOMY AND ADENOIDECTOMY  1959  . [place] EXTRACTION  1975   Social History   Socioeconomic History  . Marital status: Married    Spouse name: Not on file  . Number of children: Not on file  . Years of education: Not on file  . Highest education level: Not on file  Occupational History  . Not on file  Tobacco Use  . Smoking status: Never Smoker  . Smokeless tobacco: Never Used  Vaping Use  . Vaping Use: Never used  Substance and Sexual Activity  . Alcohol use: Yes    Alcohol/week: 8.0 standard drinks    Types: 8 Glasses of wine per week  . Drug use: No  . Sexual activity: Not on file  Other Topics Concern  . 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 Prostate cancer Father   . Arthritis Mother   . Arthritis/Rheumatoid Brother   . Colon cancer Neg Hx   . Esophageal cancer Neg Hx   . Pancreatic cancer Neg Hx   . Rectal cancer Neg Hx   . Stomach cancer Neg Hx   . Heart disease Neg Hx    Allergies  Allergen Reactions  . Repatha [Evolocumab] Other (See Comments)    Myalgias  . Bempedoic Acid Other (See Comments)    (Nexletol) muscle pain.  Marland Kitchen Crestor [Rosuvastatin Calcium] Other (See Comments)    Myalgias (muscle/joint pain)   Prior to Admission medications   Medication Sig Start Date End Date Taking? Authorizing Provider  aspirin EC 81 MG tablet Take 81 mg by mouth. Only takes occasionally Swallow whole.    [provider]  clotrimazole (ANTIFUNGAL, CLOTRIMAZOLE,) 1 % cream Apply 1  application topically 2 (two) times daily. 01/20/21   Caren Macadam, MD  Coenzyme Q10 (CO Q 10) 100 MG CAPS Take 200 mg by mouth daily. 01/16/21   Sherren Mocha, MD  Cyanocobalamin (VITAMIN B-12 PO) Take 1 tablet by mouth daily.    [provider]  ezetimibe (ZETIA) 10 MG tablet TAKE 1 TABLET BY MOUTH EVERY DAY 05/06/20   Sherren Mocha, MD  nitroGLYCERIN (NITROSTAT) 0.4 MG SL tablet Place 1 tablet (0.4 mg total) under the tongue every 5 (five) minutes as needed for chest pain. 01/08/21 04/08/21  Sherren Mocha, MD  pantoprazole (PROTONIX) 40 MG tablet Take 40 mg by mouth daily.    [provider]  pravastatin (PRAVACHOL) 10 MG tablet Take 1 tablet (10 mg total) by mouth 3 (three) times a week. 01/16/21 01/11/22  Sherren Mocha, MD  triamcinolone (KENALOG) 0.1 % Apply 1 application topically 2 (two) times daily. 01/19/21   Caren Macadam, MD     Positive ROS: Otherwise negative  All other systems have been reviewed and were otherwise negative with the exception of those mentioned in the HPI and as above.  Physical Exam: Constitutional: Alert, well-appearing, no acute distress Ears: External ears without lesions or tenderness.  Ear canals are clear bilaterally.  He had a little bit of fluid in the left ear canal that was suctioned with #3 suction from him using earwax eardrops.  But the TMs are clear otherwise with no middle ear effusion noted.  On tuning fork testing Weber was midline and AC > BC bilaterally. Nasal: External nose without lesions. Septum mild deformity with moderate rhinitis and clear mucus discharge.  No clinical evidence of infection..  Oral: Lips and gums without lesions. Tongue and palate mucosa without lesions. Posterior oropharynx clear. Neck: No palpable adenopathy or masses Respiratory: Breathing comfortably  Skin: No facial/neck lesions or rash noted.  Procedures  Assessment: Mild rhinitis with normal ear and TM examination  bilaterally.  Plan: Recommended use of Flonase 2 sprays each nostril at night if he has much nasal sinus congestion.  He should be okay going on his flight as he has no middle ear abnormality noted on microscopic exam.   Radene Journey, MD

## 2021-04-01 DIAGNOSIS — R1312 Dysphagia, oropharyngeal phase: Secondary | ICD-10-CM | POA: Diagnosis not present

## 2021-04-09 IMAGING — RF DG ESOPHAGUS
9 of 10 series · 14 of 24 positions shown · non-contrast
Comparison: None.

CLINICAL DATA: Globus sensation for 2 months. Laryngo pharyngeal
reflux.

EXAM:
ESOPHOGRAM / BARIUM SWALLOW / BARIUM TABLET STUDY
TECHNIQUE: Combined double contrast and single contrast examination performed
using effervescent crystals, thick barium liquid, and thin barium
liquid. The patient was observed with fluoroscopy swallowing a 13 mm
barium sulphate tablet.
FLUOROSCOPY TIME:  Fluoroscopy Time:  2 minutes 24 seconds
Radiation Exposure Index (if provided by the fluoroscopic device):
179 mGy
Number of Acquired Spot Images: 7

[Series 1: sequence · 2 of 20 frames shown (1 of 6)]
[frame 4/20]
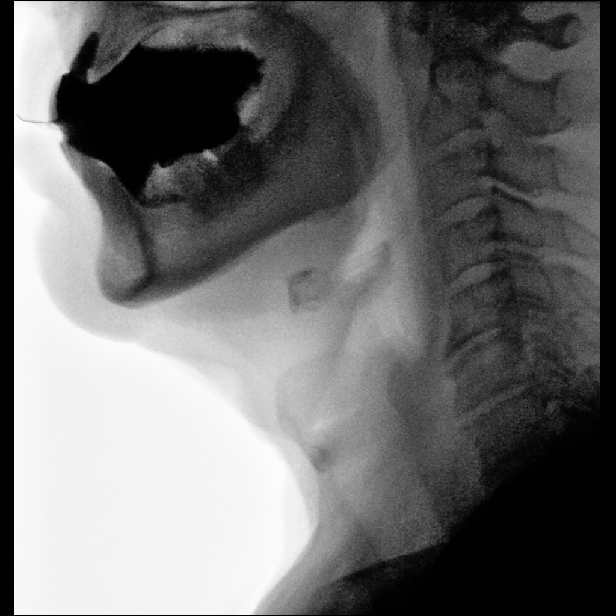
[frame 20/20]
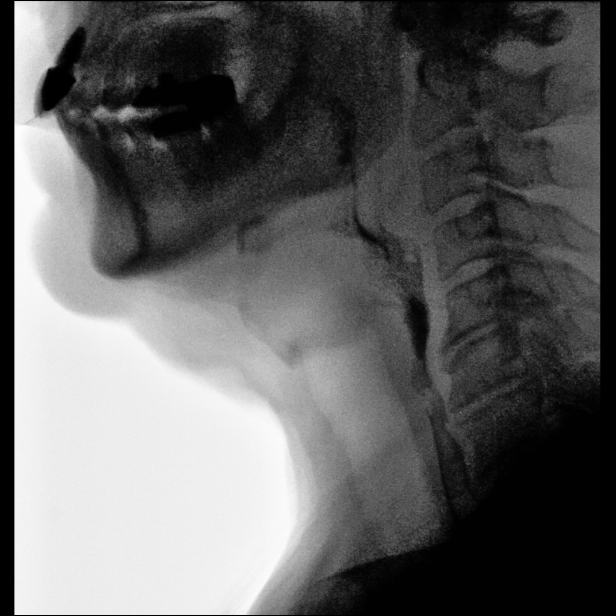

[Series 3: sequence · 2 of 14 frames shown (2 of 6)]
[frame 3/14]
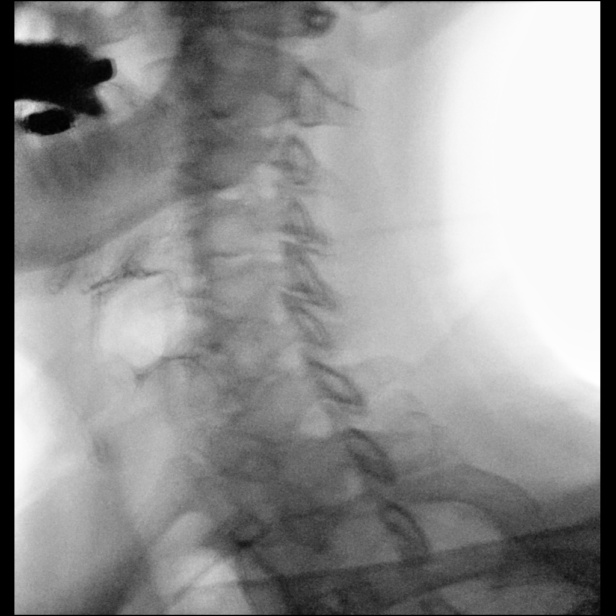
[frame 12/14]
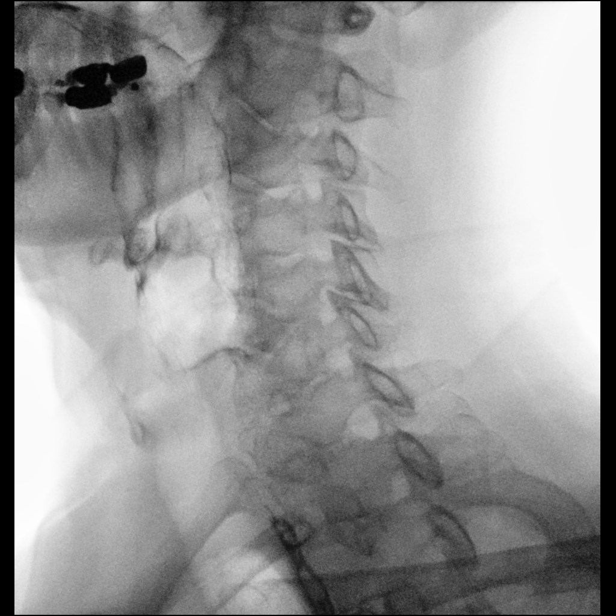

[Series 4: sequence · 2 of 12 frames shown (3 of 6)]
[frame 2/12]
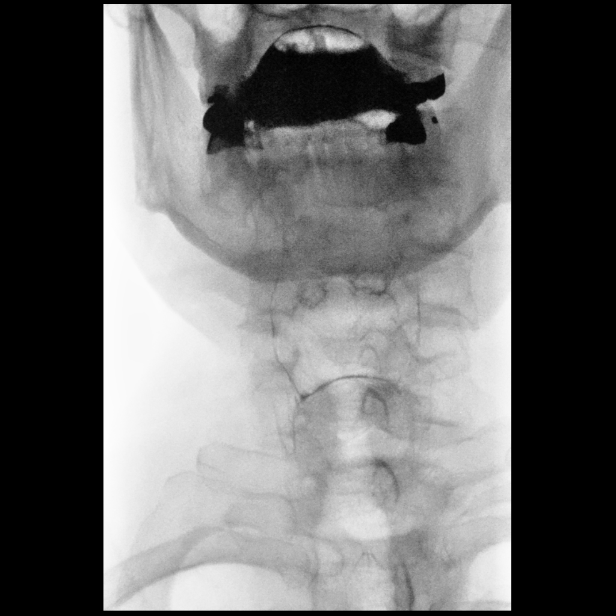
[frame 11/12]
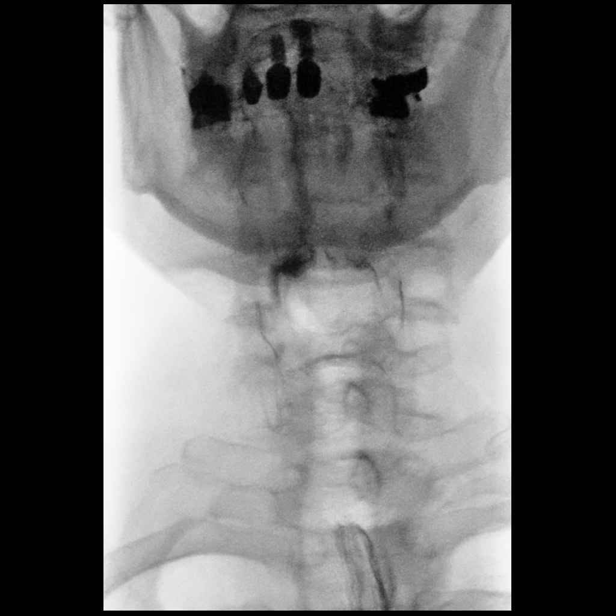

[Series 5: one shot · 0.15mm/px · 3 of 6 slices shown (1 of 3)]
[im 2/6]
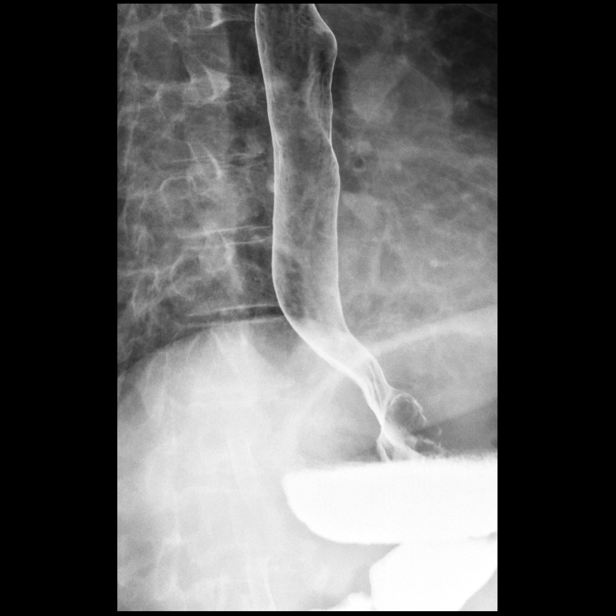
[im 4/6]
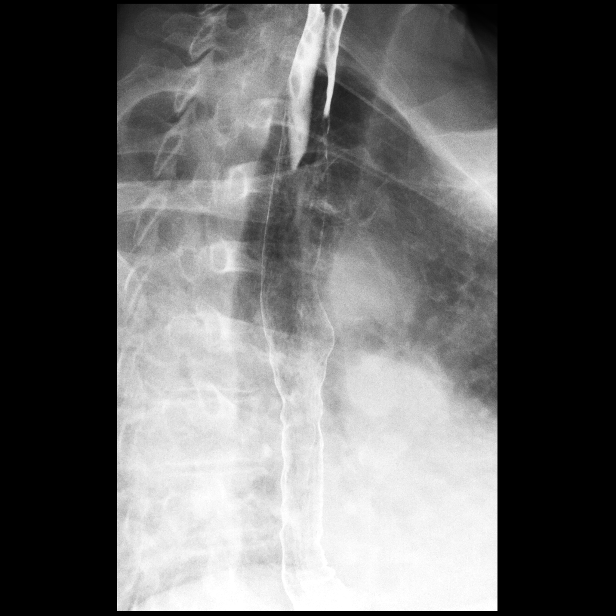
[im 6/6]
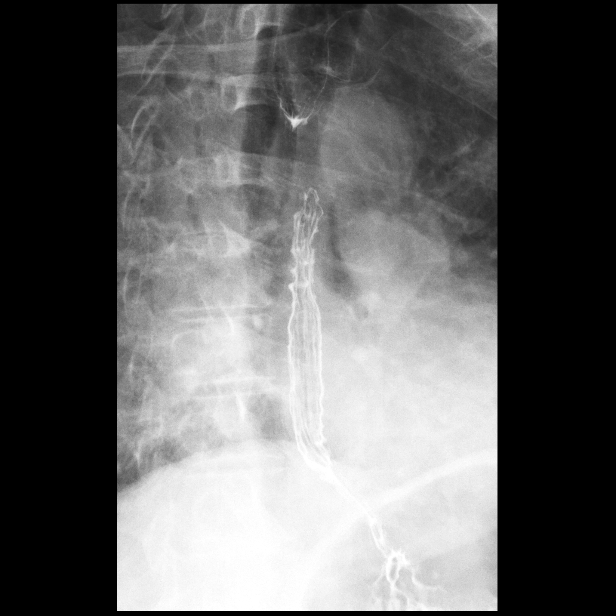

[Series 7: sequence · 1 of 97 frames shown (4 of 6)]
[frame 49/97]
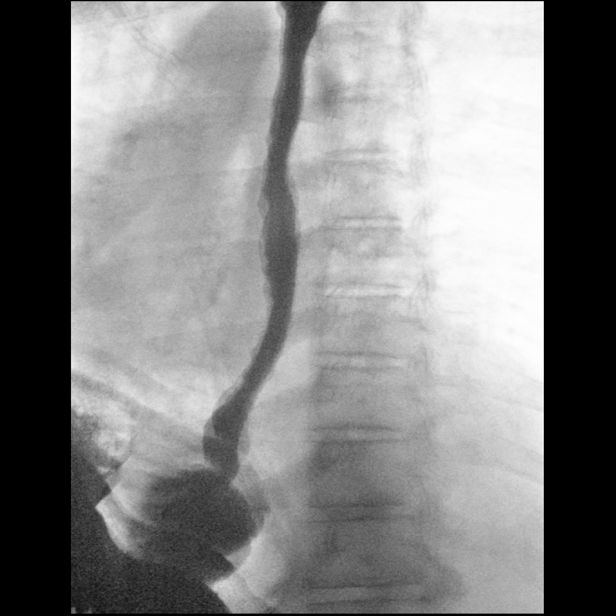

[Series 8: one shot · 1 of 1 slices shown (2 of 3)]
[im 1/1]
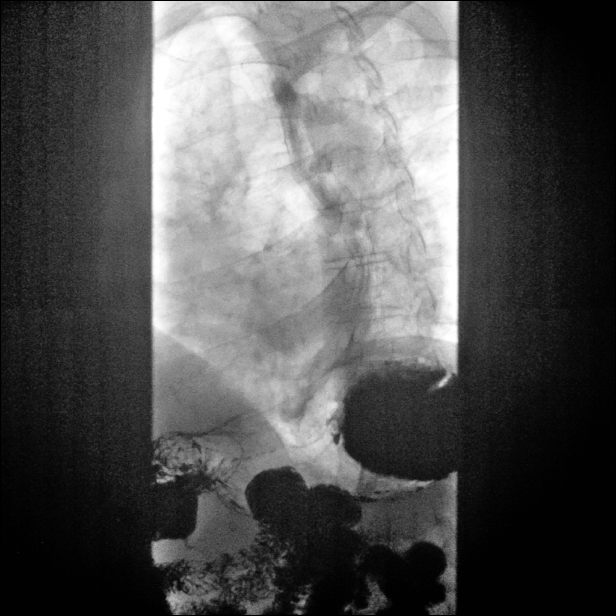

[Series 9: sequence · 1 of 2 frames shown (5 of 6)]
[frame 2/2]
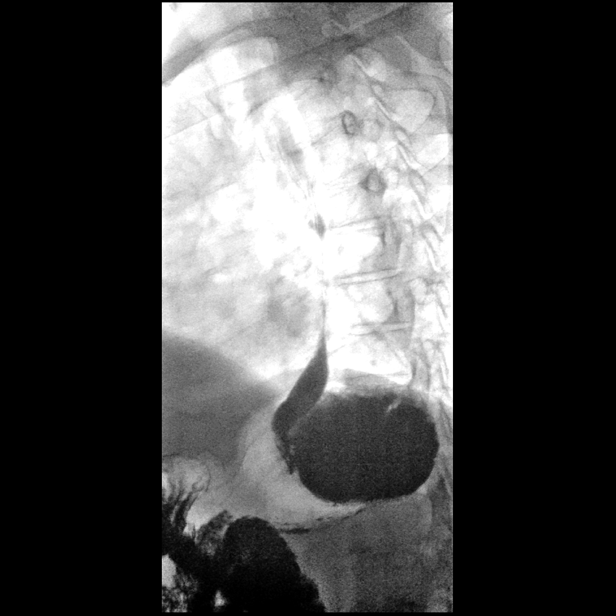

[Series 10: sequence · 1 of 27 frames shown (6 of 6)]
[frame 14/27]
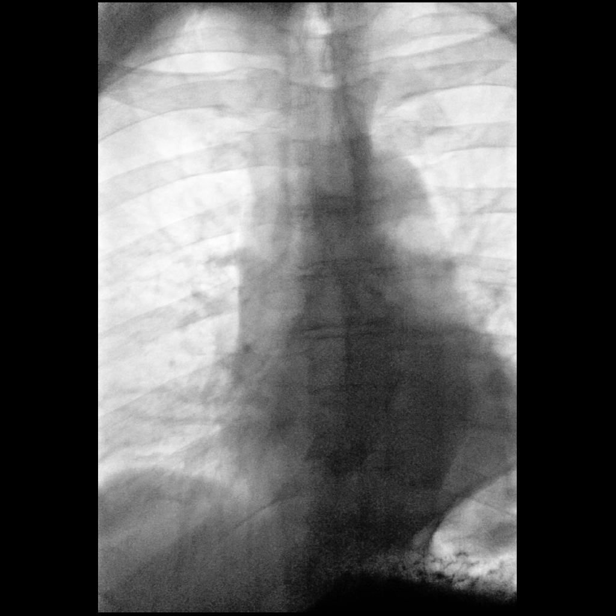

[Series 11: one shot · 1 of 1 slices shown (3 of 3)]
[im 1/1]
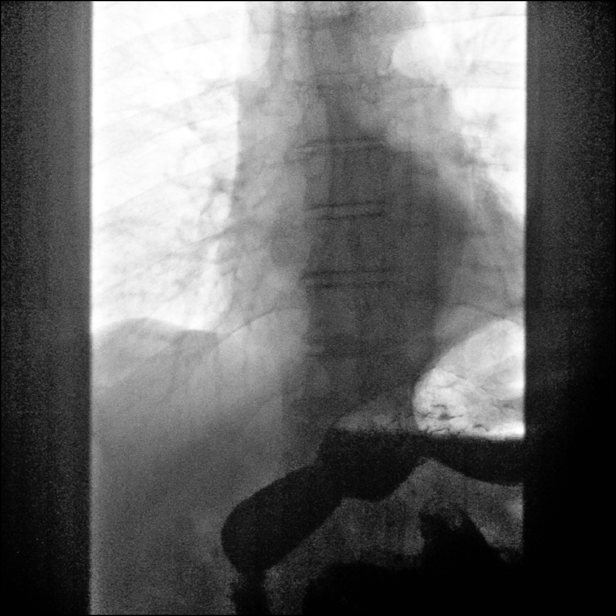

[14 of 24 positions shown; findings below may reference images not displayed]

FINDINGS: Normal oral and pharyngeal phase of swallowing, with no laryngeal
penetration or tracheobronchial aspiration. No significant barium
retention in the pharynx. No evidence of cricopharyngeus muscle
dysfunction. No evidence of pharyngeal mass, stricture or
diverticulum.

Moderate esophageal dysmotility, characterized by intermittent
weakening of primary peristalsis throughout the thoracic esophagus.
No hiatal hernia. Moderate gastroesophageal reflux elicited to the
level of midthoracic esophagus with water siphon test. Mild
granularity of the thoracic esophageal mucosa, suggesting mild
reflux esophagitis. Normal esophageal distensibility, with no
evidence of esophageal mass, stricture or ulcer. Barium tablet
traversed the esophagus into the stomach without delay.
IMPRESSION: 1. Moderate gastroesophageal reflux elicited. No hiatal hernia.
2. Moderate esophageal dysmotility, with a chronic reflux related
dysmotility pattern.
3. Suggestion of mild reflux esophagitis. No evidence of esophageal
mass, stricture or ulcer.

## 2021-04-13 DIAGNOSIS — H524 Presbyopia: Secondary | ICD-10-CM | POA: Diagnosis not present

## 2021-04-13 DIAGNOSIS — H2512 Age-related nuclear cataract, left eye: Secondary | ICD-10-CM | POA: Diagnosis not present

## 2021-04-16 ENCOUNTER — Other Ambulatory Visit: Payer: Self-pay

## 2021-04-16 ENCOUNTER — Ambulatory Visit (INDEPENDENT_AMBULATORY_CARE_PROVIDER_SITE_OTHER): Payer: Medicare Other

## 2021-04-16 VITALS — BP 110/62 | HR 61 | Temp 97.9°F | Ht 69.0 in | Wt 180.0 lb

## 2021-04-16 DIAGNOSIS — Z0000 Encounter for general adult medical examination without abnormal findings: Secondary | ICD-10-CM | POA: Diagnosis not present

## 2021-04-16 DIAGNOSIS — R1319 Other dysphagia: Secondary | ICD-10-CM | POA: Diagnosis not present

## 2021-04-16 NOTE — Progress Notes (Signed)
Subjective:   Ronnie Vazquez is a 75 y.o. male who presents for Medicare Annual/Subsequent preventive examination.  Review of Systems    n/a       Objective:    There were no vitals filed for this visit. There is no height or weight on file to calculate BMI.  Advanced Directives 10/24/2020 10/06/2018 08/29/2018 08/10/2018 08/08/2018 11/11/2017 10/28/2017  Does Patient Have a Medical Advance Directive? Yes No Yes Yes Yes Yes Yes  Type of Paramedic of [place];Living will - [place];Living will Living will Living will [place];Living will [place];Living will  Does patient want to make changes to medical advance directive? No - Patient declined - No - Patient declined - No - Patient declined - -  Copy of [place] in Chart? No - copy requested - No - copy requested No - copy requested - No - copy requested -    Current Medications (verified) Outpatient Encounter Medications as of 04/16/2021  Medication Sig  . aspirin EC 81 MG tablet Take 81 mg by mouth. Only takes occasionally Swallow whole.  . clotrimazole (ANTIFUNGAL, CLOTRIMAZOLE,) 1 % cream Apply 1 application topically 2 (two) times daily.  . Coenzyme Q10 (CO Q 10) 100 MG CAPS Take 200 mg by mouth daily.  . Cyanocobalamin (VITAMIN B-12 PO) Take 1 tablet by mouth daily.  Marland Kitchen ezetimibe (ZETIA) 10 MG tablet TAKE 1 TABLET BY MOUTH EVERY DAY  . nitroGLYCERIN (NITROSTAT) 0.4 MG SL tablet Place 1 tablet (0.4 mg total) under the tongue every 5 (five) minutes as needed for chest pain.  . pantoprazole (PROTONIX) 40 MG tablet Take 40 mg by mouth daily.  . pravastatin (PRAVACHOL) 10 MG tablet Take 1 tablet (10 mg total) by mouth 3 (three) times a week.  . triamcinolone (KENALOG) 0.1 % Apply 1 application topically 2 (two) times daily.   No facility-administered encounter medications on file as of 04/16/2021.    Allergies (verified) Repatha  [evolocumab], Bempedoic acid, and Crestor [rosuvastatin calcium]   History: Past Medical History:  Diagnosis Date  . Arthritis    toe  . Atrial flutter ([place])    Echo 9/21: EF 60-65, no RWMA, normal RVSF, trivial MR, borderline dilation of aorta at sinus of Valsalva (37 mm), mild LAE, RVSP 26.8  . Chicken pox    hx  . Coronary artery disease    08/29/18 PCI/DES with bifurcation from LAD into 1st diag.   Marland Kitchen GERD (gastroesophageal reflux disease)    with certain foods/on OTC meds  . Hyperlipidemia    on meds  . Left shoulder pain 02/02/2013  . Microscopic hematuria 02/02/2013  . Vision loss of left eye    Past Surgical History:  Procedure Laterality Date  . A-FLUTTER ABLATION N/A 10/24/2020   Procedure: A-FLUTTER ABLATION;  Surgeon: Deboraha Sprang, MD;  Location: [facility];  Service: Cardiovascular;  Laterality: N/A;  . COLONOSCOPY    . CORONARY BALLOON ANGIOPLASTY N/A 08/29/2018   Procedure: CORONARY BALLOON ANGIOPLASTY;  Surgeon: Sherren Mocha, MD;  Location: [facility];  Service: Cardiovascular;  Laterality: N/A;  . CORONARY STENT INTERVENTION N/A 08/29/2018   Procedure: CORONARY STENT INTERVENTION;  Surgeon: Sherren Mocha, MD;  Location: [facility];  Service: Cardiovascular;  Laterality: N/A;  . ESOPHAGEAL MANOMETRY N/A 10/08/2020   Procedure: ESOPHAGEAL MANOMETRY (EM);  Surgeon: Ladene Artist, MD;  Location: [facility];  Service: Endoscopy;  Laterality: N/A;  .  LEFT HEART CATH AND CORONARY ANGIOGRAPHY N/A 08/08/2018   Procedure: LEFT HEART CATH AND CORONARY ANGIOGRAPHY;  Surgeon: Lorretta Harp, MD;  Location: [facility];  Service: Cardiovascular;  Laterality: N/A;  . TONSILLECTOMY AND ADENOIDECTOMY  1959  . WISDOM TOOTH EXTRACTION  1975   Family History  Problem Relation Age of Onset  . Prostate cancer Father   . Arthritis Mother   . Arthritis/Rheumatoid Brother   . Colon cancer Neg Hx   . Esophageal cancer Neg Hx   . Pancreatic cancer  Neg Hx   . Rectal cancer Neg Hx   . Stomach cancer Neg Hx   . Heart disease Neg Hx    Social History   Socioeconomic History  . Marital status: Married    Spouse name: Not on file  . Number of children: Not on file  . Years of education: Not on file  . Highest education level: Not on file  Occupational History  . Not on file  Tobacco Use  . Smoking status: Never Smoker  . Smokeless tobacco: Never Used  Vaping Use  . Vaping Use: Never used  Substance and Sexual Activity  . Alcohol use: Yes    Alcohol/week: 8.0 standard drinks    Types: 8 Glasses of wine per week  . Drug use: No  . Sexual activity: Not on file  Other Topics Concern  . 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  Patient Care Team: Caren Macadam, MD as PCP - General (Family Medicine) Sherren Mocha, MD as PCP - Cardiology (Cardiology)  Indicate any recent Medical Services you may have received from other than [facility] providers in the past year (date may be approximate).     Assessment:   This is a routine wellness examination for [name].  Hearing/Vision screen No exam data present  Dietary issues and exercise activities discussed:    Goals   None    Depression Screen PHQ 2/9 Scores 04/04/2020 02/02/2019 11/28/2018 11/28/2018 10/23/2018 08/12/2017 12/01/2015  PHQ - 2 Score 0 0 0 0 0 0 0  PHQ- 9 Score - - 0  - - - -    Fall Risk Fall Risk  04/04/2020 11/21/2019 11/28/2018 10/17/2018 08/12/2017  Falls in the past year? 0 0 0 No No  Comment - Emmi Telephone Survey: data to providers prior to load - - -  Number falls in past yr: 0 - 0 - -  Injury with Fall? 0 - 0 - -  Risk for fall due to : - - - Impaired vision -  Risk for fall due to: Comment - - - Vision loss (left eye)  -  FALL RISK PREVENTION PERTAINING TO THE HOME:  Any stairs in or around the home? Yes  If so, are there any without handrails? Yes  Home free of loose throw rugs in walkways, pet beds, electrical cords, etc? Yes  Adequate lighting in your home to reduce risk of falls? Yes   ASSISTIVE DEVICES UTILIZED TO PREVENT FALLS:  Life alert? No  Use of a cane, walker or w/c? No  Grab bars in the bathroom? Yes  Shower chair or bench in shower? Yes  Elevated toilet seat or a handicapped toilet? Yes   TIMED UP AND GO:  Was the test performed? Yes .  Length of time to ambulate 10 feet: 6 sec.   Gait steady and fast without use of assistive device  Cognitive Function:     Normal cognitive status assessed by direct observation by this Nurse Health Advisor. No abnormalities found.      Immunizations Immunization History  Administered Date(s) Administered  . Influenza, High Dose Seasonal PF 12/01/2015, 10/27/2019  . PFIZER(Purple Top)SARS-COV-2 Vaccination 02/19/2020, 03/11/2020  . Pneumococcal Conjugate-13 12/01/2015  . Pneumococcal Polysaccharide-23 08/12/2017  . Tdap 05/27/2009  . Zoster 08/24/2013    TDAP status: Due, Education has been provided regarding the importance of this vaccine. Advised may receive this vaccine at local pharmacy or Health Dept. Aware to provide a copy of the vaccination record if obtained from local pharmacy or Health Dept. Verbalized acceptance and understanding.  Flu Vaccine status: Up to date  Pneumococcal vaccine status: Up to date  Covid-19 vaccine status: Completed  vaccines  Qualifies for Shingles Vaccine? Yes   Zostavax completed Yes   Shingrix Completed?: No.    Education has been provided regarding the importance of this vaccine. Patient has been advised to call insurance company to determine out of pocket expense if they have not yet received this vaccine. Advised may also receive vaccine at local pharmacy or Health Dept. Verbalized acceptance and understanding.  Screening Tests Health Maintenance  Topic Date Due  . COLONOSCOPY (Pts 45-54yrs Insurance coverage will need to be confirmed)  11/24/2020  . TETANUS/TDAP  01/19/2022 (Originally 05/28/2019)  . COVID-19 Vaccine  Completed  . Hepatitis C Screening  Completed  . PNA vac Low Risk Adult  Completed  . HPV VACCINES  Aged Out  . INFLUENZA VACCINE  Discontinued    Health Maintenance  Health Maintenance Due  Topic Date Due  . COLONOSCOPY (Pts 45-47yrs Insurance coverage will need to be confirmed)  11/24/2020    Colorectal cancer screening: Type of screening: Colonoscopy. Completed 11/24/20. Repeat every 3 years  Lung Cancer Screening: (Low Dose CT Chest recommended if Age 66-80 years, 30 pack-year currently smoking OR have quit w/in 15years.) does not qualify.   Lung Cancer Screening Referral: n/a   Additional Screening:  Hepatitis C Screening: does qualify; Completed 08/12/2017   Vision Screening: Recommended annual ophthalmology exams for early detection of glaucoma and other disorders of the eye. Is the patient up to date with their annual eye exam?  Yes  Who is the provider or what is the name of the office in which the patient attends annual eye exams? Dr.Miller If pt is not established with a provider, would they like to be referred to a provider to establish care? No .   Dental Screening ng: Recommended annual dental exams for proper oral hygiene  Community Resource Referral / Chronic Care Management: CRR required this visit?  No   CCM required this visit?  No  Plan:     I have personally reviewed and noted the following in the patient's chart:   . Medical and social history . Use of alcohol, tobacco or illicit drugs  . Current medications and supplements . Functional ability and status . Nutritional status . Physical activity . Advanced directives . List of other physicians . Hospitalizations, surgeries, and ER visits in previous 12 months . Vitals . Screenings to include cognitive, depression, and falls . Referrals and appointments  In addition, I have reviewed and discussed with patient certain preventive protocols, quality metrics, and best practice recommendations. A written personalized care plan for preventive services as well as general preventive health recommendations were provided to patient.     Randel Pigg, LPN   [id number]   Nurse Notes: none

## 2021-04-16 NOTE — Patient Instructions (Signed)
Ronnie Vazquez , Thank you for taking time to come for your Medicare Wellness Visit. I appreciate your ongoing commitment to your health goals. Please review the following plan we discussed and let me know if I can assist you in the future.   Screening recommendations/referrals: Colonoscopy: Due Has appointment 05/2021 Recommended yearly ophthalmology/optometry visit for glaucoma screening and checkup Recommended yearly dental visit for hygiene and checkup  Vaccinations: Influenza vaccine: Due fall 2022 Pneumococcal vaccine: Completed series  Tdap vaccine: Due upon injury  Shingles vaccine: local pharmacy     Advanced directives: will provide copies   Conditions/risks identified: none   Next appointment:none   Preventive Care 16 Years and Older, Male Preventive care refers to lifestyle choices and visits with your health care provider that can promote health and wellness. What does preventive care include?  A yearly physical exam. This is also called an annual well check.  Dental exams once or twice a year.  Routine eye exams. Ask your health care provider how often you should have your eyes checked.  Personal lifestyle choices, including:  Daily care of your teeth and gums.  Regular physical activity.  Eating a healthy diet.  Avoiding tobacco and drug use.  Limiting alcohol use.  Practicing safe sex.  Taking low doses of aspirin every day.  Taking vitamin and mineral supplements as recommended by your health care provider. What happens during an annual well check? The services and screenings done by your health care provider during your annual well check will depend on your age, overall health, lifestyle risk factors, and family history of disease. Counseling  Your health care provider may ask you questions about your:  Alcohol use.  Tobacco use.  Drug use.  Emotional well-being.  Home and relationship well-being.  Sexual activity.  Eating  habits.  History of falls.  Memory and ability to understand (cognition).  Work and work Statistician. Screening  You may have the following tests or measurements:  Height, weight, and BMI.  Blood pressure.  Lipid and cholesterol levels. These may be checked every 5 years, or more frequently if you are over 9 years old.  Skin check.  Lung cancer screening. You may have this screening every year starting at age 11 if you have a 30-pack-year history of smoking and currently smoke or have quit within the past 15 years.  Fecal occult blood test (FOBT) of the stool. You may have this test every year starting at age 76.  Flexible sigmoidoscopy or colonoscopy. You may have a sigmoidoscopy every 5 years or a colonoscopy every 10 years starting at age 62.  Prostate cancer screening. Recommendations will vary depending on your family history and other risks.  Hepatitis C blood test.  Hepatitis B blood test.  Sexually transmitted disease (STD) testing.  Diabetes screening. This is done by checking your blood sugar (glucose) after you have not eaten for a while (fasting). You may have this done every 1-3 years.  Abdominal aortic aneurysm (AAA) screening. You may need this if you are a current or former smoker.  Osteoporosis. You may be screened starting at age 36 if you are at high risk. Talk with your health care provider about your test results, treatment options, and if necessary, the need for more tests. Vaccines  Your health care provider may recommend certain vaccines, such as:  Influenza vaccine. This is recommended every year.  Tetanus, diphtheria, and acellular pertussis (Tdap, Td) vaccine. You may need a Td booster every 10 years.  Zoster  vaccine. You may need this after age 30.  Pneumococcal 13-valent conjugate (PCV13) vaccine. One dose is recommended after age 40.  Pneumococcal polysaccharide (PPSV23) vaccine. One dose is recommended after age 72. Talk to your health  care provider about which screenings and vaccines you need and how often you need them. This information is not intended to replace advice given to you by your health care provider. Make sure you discuss any questions you have with your health care provider. Document Released: 01/09/2016 Document Revised: 09/01/2016 Document Reviewed: 10/14/2015 Elsevier Interactive Patient Education  2017 [place] Prevention in the Home Falls can cause injuries. They can happen to people of all ages. There are many things you can do to make your home safe and to help prevent falls. What can I do on the outside of my home?  Regularly fix the edges of walkways and driveways and fix any cracks.  Remove anything that might make you trip as you walk through a door, such as a raised step or threshold.  Trim any bushes or trees on the path to your home.  Use bright outdoor lighting.  Clear any walking paths of anything that might make someone trip, such as rocks or tools.  Regularly check to see if handrails are loose or broken. Make sure that both sides of any steps have handrails.  Any raised decks and porches should have guardrails on the edges.  Have any leaves, snow, or ice cleared regularly.  Use sand or salt on walking paths during winter.  Clean up any spills in your garage right away. This includes oil or grease spills. What can I do in the bathroom?  Use night lights.  Install grab bars by the toilet and in the tub and shower. Do not use towel bars as grab bars.  Use non-skid mats or decals in the tub or shower.  If you need to sit down in the shower, use a plastic, non-slip stool.  Keep the floor dry. Clean up any water that spills on the floor as soon as it happens.  Remove soap buildup in the tub or shower regularly.  Attach bath mats securely with double-sided non-slip rug tape.  Do not have throw rugs and other things on the floor that can make you trip. What can I do  in the bedroom?  Use night lights.  Make sure that you have a light by your bed that is easy to reach.  Do not use any sheets or blankets that are too big for your bed. They should not hang down onto the floor.  Have a firm chair that has side arms. You can use this for support while you get dressed.  Do not have throw rugs and other things on the floor that can make you trip. What can I do in the kitchen?  Clean up any spills right away.  Avoid walking on wet floors.  Keep items that you use a lot in easy-to-reach places.  If you need to reach something above you, use a strong step stool that has a grab bar.  Keep electrical cords out of the way.  Do not use floor polish or wax that makes floors slippery. If you must use wax, use non-skid floor wax.  Do not have throw rugs and other things on the floor that can make you trip. What can I do with my stairs?  Do not leave any items on the stairs.  Make sure that there are handrails  on both sides of the stairs and use them. Fix handrails that are broken or loose. Make sure that handrails are as long as the stairways.  Check any carpeting to make sure that it is firmly attached to the stairs. Fix any carpet that is loose or worn.  Avoid having throw rugs at the top or bottom of the stairs. If you do have throw rugs, attach them to the floor with carpet tape.  Make sure that you have a light switch at the top of the stairs and the bottom of the stairs. If you do not have them, ask someone to add them for you. What else can I do to help prevent falls?  Wear shoes that:  Do not have high heels.  Have rubber bottoms.  Are comfortable and fit you well.  Are closed at the toe. Do not wear sandals.  If you use a stepladder:  Make sure that it is fully opened. Do not climb a closed stepladder.  Make sure that both sides of the stepladder are locked into place.  Ask someone to hold it for you, if possible.  Clearly mark  and make sure that you can see:  Any grab bars or handrails.  First and last steps.  Where the edge of each step is.  Use tools that help you move around (mobility aids) if they are needed. These include:  Canes.  Walkers.  Scooters.  Crutches.  Turn on the lights when you go into a dark area. Replace any light bulbs as soon as they burn out.  Set up your furniture so you have a clear path. Avoid moving your furniture around.  If any of your floors are uneven, fix them.  If there are any pets around you, be aware of where they are.  Review your medicines with your doctor. Some medicines can make you feel dizzy. This can increase your chance of falling. Ask your doctor what other things that you can do to help prevent falls. This information is not intended to replace advice given to you by your health care provider. Make sure you discuss any questions you have with your health care provider. Document Released: 10/09/2009 Document Revised: 05/20/2016 Document Reviewed: 01/17/2015 Elsevier Interactive Patient Education  2017 [employer].

## 2021-04-22 ENCOUNTER — Encounter: Payer: Self-pay | Admitting: Family Medicine

## 2021-04-27 ENCOUNTER — Other Ambulatory Visit: Payer: Medicare Other

## 2021-05-04 ENCOUNTER — Encounter: Payer: Self-pay | Admitting: Family Medicine

## 2021-05-04 ENCOUNTER — Other Ambulatory Visit: Payer: Self-pay | Admitting: Cardiovascular Disease

## 2021-05-04 DIAGNOSIS — E782 Mixed hyperlipidemia: Secondary | ICD-10-CM | POA: Diagnosis not present

## 2021-05-04 DIAGNOSIS — E7212 Methylenetetrahydrofolate reductase deficiency: Secondary | ICD-10-CM | POA: Diagnosis not present

## 2021-05-04 DIAGNOSIS — D519 Vitamin B12 deficiency anemia, unspecified: Secondary | ICD-10-CM | POA: Diagnosis not present

## 2021-05-04 DIAGNOSIS — K2100 Gastro-esophageal reflux disease with esophagitis, without bleeding: Secondary | ICD-10-CM | POA: Diagnosis not present

## 2021-05-04 DIAGNOSIS — E8881 Metabolic syndrome: Secondary | ICD-10-CM | POA: Diagnosis not present

## 2021-05-04 DIAGNOSIS — E559 Vitamin D deficiency, unspecified: Secondary | ICD-10-CM | POA: Diagnosis not present

## 2021-05-04 DIAGNOSIS — E063 Autoimmune thyroiditis: Secondary | ICD-10-CM | POA: Diagnosis not present

## 2021-05-04 DIAGNOSIS — I2510 Atherosclerotic heart disease of native coronary artery without angina pectoris: Secondary | ICD-10-CM | POA: Diagnosis not present

## 2021-05-04 DIAGNOSIS — J312 Chronic pharyngitis: Secondary | ICD-10-CM | POA: Diagnosis not present

## 2021-05-04 DIAGNOSIS — E039 Hypothyroidism, unspecified: Secondary | ICD-10-CM | POA: Diagnosis not present

## 2021-05-04 DIAGNOSIS — E8849 Other mitochondrial metabolism disorders: Secondary | ICD-10-CM | POA: Diagnosis not present

## 2021-05-04 DIAGNOSIS — R5383 Other fatigue: Secondary | ICD-10-CM | POA: Diagnosis not present

## 2021-05-04 DIAGNOSIS — R7982 Elevated C-reactive protein (CRP): Secondary | ICD-10-CM | POA: Diagnosis not present

## 2021-05-04 DIAGNOSIS — B9681 Helicobacter pylori [H. pylori] as the cause of diseases classified elsewhere: Secondary | ICD-10-CM | POA: Diagnosis not present

## 2021-05-05 LAB — HEPATIC FUNCTION PANEL
ALT: 18 IU/L (ref 0–44)
AST: 19 IU/L (ref 0–40)
Albumin: 4.4 g/dL (ref 3.7–4.7)
Alkaline Phosphatase: 53 IU/L (ref 44–121)
Bilirubin Total: 0.4 mg/dL (ref 0.0–1.2)
Bilirubin, Direct: 0.11 mg/dL (ref 0.00–0.40)
Total Protein: 6.4 g/dL (ref 6.0–8.5)

## 2021-05-05 LAB — LIPID PANEL
Chol/HDL Ratio: 3.6 ratio (ref 0.0–5.0)
Cholesterol, Total: 175 mg/dL (ref 100–199)
HDL: 48 mg/dL (ref >39)
LDL Chol Calc (NIH): 104 mg/dL — ABNORMAL HIGH (ref 0–99)
Triglycerides: 129 mg/dL (ref 0–149)
VLDL Cholesterol Cal: 23 mg/dL (ref 5–40)

## 2021-05-11 ENCOUNTER — Ambulatory Visit (AMBULATORY_SURGERY_CENTER): Payer: Self-pay

## 2021-05-11 ENCOUNTER — Other Ambulatory Visit: Payer: Self-pay

## 2021-05-11 VITALS — Ht 70.0 in | Wt 178.0 lb

## 2021-05-11 DIAGNOSIS — Z86010 Personal history of colonic polyps: Secondary | ICD-10-CM

## 2021-05-11 MED ORDER — NA SULFATE-K SULFATE-MG SULF 17.5-3.13-1.6 GM/177ML PO SOLN: 1.0000 | Freq: Once | ORAL | 0 refills | Status: AC

## 2021-05-11 NOTE — Progress Notes (Signed)
No egg or soy allergy known to patient  No issues with past sedation with any surgeries or procedures Patient denies ever being told they had issues or difficulty with intubation  No FH of Malignant Hyperthermia No diet pills per patient No home 02 use per patient  No blood thinners per patient  Pt denies issues with constipation  No A fib  AFLUTTER- no meds at this time  EMMI video via MyChart  COVID 19 guidelines implemented in PV today with Pt and RN  Pt is fully vaccinated for Covid  NO PA's for preps discussed with pt in PV today  Discussed with pt there will be an out-of-pocket cost for prep and that varies from $0 to 70 dollars  Due to the COVID-19 pandemic we are asking patients to follow certain guidelines.  Pt aware of COVID protocols and [facility] guidelines

## 2021-05-29 ENCOUNTER — Telehealth: Payer: Self-pay

## 2021-05-29 DIAGNOSIS — Z23 Encounter for immunization: Secondary | ICD-10-CM | POA: Diagnosis not present

## 2021-05-29 NOTE — Telephone Encounter (Signed)
The patient reports he took a few doses of pravastatin and stopped taking due to back and leg pain.  He is not against statin use, but does not want the pain. He has tried and failed several cholesterol-lowering agents in the past. Repatha, bempedoic acid, and statins are listed as allergies. He is able to tolerate and does take Zetia 10 mg daily.  He recently went to [facility] and had hair samples, blood work, and a full evaluation. He was told he has high levels of lactic acid in his body and that statins trigger the release of more, which is why he cannot tolerate the medications.  The provider there would like for him to [facility] for 3 months for further testing and troubleshooting and he would like Dr. Antionette Char input since his LDL is elevated.  Informed the patient that he should continue Zetia at this time and that records would be requested from [facility] for Dr. Burt Knack to review. The patient was grateful for assistance and awaits Dr. Antionette Char review/recommendations.   Spoke with [facility] (phone#[phone]). They will call the patient to sign release and fax records to [phone] attn: Valetta Fuller

## 2021-05-29 NOTE — Telephone Encounter (Signed)
-----   Message from Sherren Mocha, MD sent at 05/18/2021 12:52 PM EDT ----- Not much change in lab values.  We had discussed low-dose pravastatin in the past.  Did he try this and not tolerate?  I do not see it on his medicine list

## 2021-06-04 ENCOUNTER — Ambulatory Visit (INDEPENDENT_AMBULATORY_CARE_PROVIDER_SITE_OTHER): Payer: Medicare Other | Admitting: Gastroenterology

## 2021-06-04 ENCOUNTER — Encounter: Payer: Self-pay | Admitting: Gastroenterology

## 2021-06-04 VITALS — BP 120/70 | HR 68 | Ht 70.0 in | Wt 180.0 lb

## 2021-06-04 DIAGNOSIS — K2970 Gastritis, unspecified, without bleeding: Secondary | ICD-10-CM | POA: Diagnosis not present

## 2021-06-04 DIAGNOSIS — K648 Other hemorrhoids: Secondary | ICD-10-CM | POA: Diagnosis not present

## 2021-06-04 DIAGNOSIS — K649 Unspecified hemorrhoids: Secondary | ICD-10-CM | POA: Insufficient documentation

## 2021-06-04 DIAGNOSIS — I2510 Atherosclerotic heart disease of native coronary artery without angina pectoris: Secondary | ICD-10-CM | POA: Diagnosis not present

## 2021-06-04 MED ORDER — HYDROCORTISONE (PERIANAL) 2.5 % EX CREA: 1.0000 "application " | TOPICAL_CREAM | Freq: Two times a day (BID) | CUTANEOUS | 1 refills | Status: DC

## 2021-06-04 NOTE — Progress Notes (Signed)
Reviewed and agree with management plan.  Amauri Medellin T. Mylani Gentry, MD FACG [phone]  

## 2021-06-04 NOTE — Patient Instructions (Signed)
If you are age 73 or older, your body mass index should be between 23-30. Your Body mass index is 25.83 kg/m. If this is out of the aforementioned range listed, please consider follow up with your Primary Care Provider.  If you are age 23 or younger, your body mass index should be between 19-25. Your Body mass index is 25.83 kg/m. If this is out of the aformentioned range listed, please consider follow up with your Primary Care Provider.   We have sent the following medications to your pharmacy for you to pick up at your convenience: Hydrocortisone cream 2.5 % put on over the counter  preparation H suppository.  It is ok to discontinue Pantoprazole for a few months.    The [facility] providers would like to encourage you to use [facility] to communicate with providers for non-urgent requests or questions.  Due to long hold times on the telephone, sending your provider a message by [facility] may be a faster and more efficient way to get a response.  Please allow 48 business hours for a response.  Please remember that this is for non-urgent requests.   It was a pleasure to see you today!  Thank you for trusting me with your gastrointestinal care!    Alonza Bogus, PA-C

## 2021-06-04 NOTE — Progress Notes (Signed)
06/04/2021 Ronnie Vazquez [id number] 02-Jul-1946   HISTORY OF PRESENT ILLNESS: This is a 75 year old male is a patient of Dr. Lynne Leader.  He is here today to discuss discontinuing his PPI for a few months.  He saw what he calls a functional medicine doctor, looks like holistic type approach.  He said that the goal of this visit was to figure out why he has high lactic acid levels in his body.  He says he has not been able to take statins because they immediately cause muscle pain and he was found to have high lactic acid levels.  She formulated this whole plan for him and wanted him to taper off of his PPI and remain off of it for a few months.  He wanted to be sure that he had our blessing and there is no significant reason that he could not/should not discontinue it.  He denies any heartburn/reflux/indigestion.  He denies dysphagia.  He says that he only recalls ever having on significant episode of acid reflux in his life.  EGD in September 2021 as follows:  - Dilation in the upper third of the esophagus and in the middle third of the esophagus. - No endoscopic esophageal abnormality to explain patient's dysphagia. Esophagus dilated. Dilated. - Erosive gastropathy with no bleeding and no stigmata of recent bleeding. Biopsied. - Normal duodenal bulb and second portion of the duodenum.  Biopsies showed mild reactive gastropathy and mild chronic gastritis with no H. pylori.  While he was here he also wanted to be evaluated for hemorrhoids.  He has been noticing some discomfort in the anal area.  He says that he was having some constipation and straining a lot and thinks that is what set this off.  He denies any bleeding.  He is actually scheduled for colonoscopy with Dr. Fuller Plan in a couple of weeks.  Has been using preparation H ointment externally.  Past Medical History:  Diagnosis Date   Arthritis    bilateral feet   Atrial flutter ([place])    Echo 9/21: EF 60-65, no RWMA, normal RVSF, trivial  MR, borderline dilation of aorta at sinus of Valsalva (37 mm), mild LAE, RVSP 26.8   Chicken pox    hx   Coronary artery disease    08/29/18 PCI/DES with bifurcation from LAD into 1st diag.    GERD (gastroesophageal reflux disease)    with certain foods/on OTC meds   Hyperlipidemia    on meds   Left shoulder pain 02/02/2013   Microscopic hematuria 02/02/2013   Vision loss of left eye    Past Surgical History:  Procedure Laterality Date   A-FLUTTER ABLATION N/A 10/24/2020   Procedure: A-FLUTTER ABLATION;  Surgeon: Deboraha Sprang, MD;  Location: [facility];  Service: Cardiovascular;  Laterality: N/A;   COLONOSCOPY  10/2017   MS-suprep(good)-SSA/TICS/hems/TA's-3 yr recall   CORONARY BALLOON ANGIOPLASTY N/A 08/29/2018   Procedure: CORONARY BALLOON ANGIOPLASTY;  Surgeon: Sherren Mocha, MD;  Location: [facility];  Service: Cardiovascular;  Laterality: N/A;   CORONARY STENT INTERVENTION N/A 08/29/2018   Procedure: CORONARY STENT INTERVENTION;  Surgeon: Sherren Mocha, MD;  Location: [facility];  Service: Cardiovascular;  Laterality: N/A;   ESOPHAGEAL MANOMETRY N/A 10/08/2020   Procedure: ESOPHAGEAL MANOMETRY (EM);  Surgeon: Ladene Artist, MD;  Location: [facility];  Service: Endoscopy;  Laterality: N/A;   LEFT HEART CATH AND CORONARY ANGIOGRAPHY N/A 08/08/2018   Procedure: LEFT HEART CATH AND CORONARY ANGIOGRAPHY;  Surgeon: Lorretta Harp, MD;  Location: [facility];  Service: Cardiovascular;  Laterality: N/A;   POLYPECTOMY  10/2017   SSA/TA's   TONSILLECTOMY AND ADENOIDECTOMY  1959   WISDOM TOOTH EXTRACTION  1975    reports that he has never smoked. He has never used smokeless tobacco. He reports current alcohol use of about 1.0 standard drink of alcohol per week. He reports that he does not use drugs. family history includes Arthritis in his mother; Arthritis/Rheumatoid in his brother; Prostate cancer in his father. Allergies  Allergen Reactions    Repatha [Evolocumab] Other (See Comments)    Myalgias   Bempedoic Acid Other (See Comments)    (Nexletol) muscle pain.   Crestor [Rosuvastatin Calcium] Other (See Comments)    Myalgias (muscle/joint pain)   Statins     Other reaction(s): Other (See Comments), Other (See Comments) Myalgias (muscle/joint pain) Muscle aches Pt stated "goes to my muscles"       Outpatient Encounter Medications as of 06/04/2021  Medication Sig   aspirin EC 81 MG tablet Take 81 mg by mouth. Only takes occasionally Swallow whole.   clotrimazole (ANTIFUNGAL, CLOTRIMAZOLE,) 1 % cream Apply 1 application topically 2 (two) times daily.   Coenzyme Q10 (CO Q 10) 100 MG CAPS Take 200 mg by mouth daily.   Cyanocobalamin (VITAMIN B-12 PO) Take 1 tablet by mouth daily.   ezetimibe (ZETIA) 10 MG tablet TAKE 1 TABLET BY MOUTH EVERY DAY   pantoprazole (PROTONIX) 40 MG tablet Take 40 mg by mouth daily.   triamcinolone (KENALOG) 0.1 % Apply 1 application topically 2 (two) times daily. (Patient taking differently: Apply 1 application topically 2 (two) times daily as needed.)   nitroGLYCERIN (NITROSTAT) 0.4 MG SL tablet Place 1 tablet (0.4 mg total) under the tongue every 5 (five) minutes as needed for chest pain.   No facility-administered encounter medications on file as of 06/04/2021.     REVIEW OF SYSTEMS  : All other systems reviewed and negative except where noted in the History of Present Illness.   PHYSICAL EXAM: BP 120/70   Pulse 68   Ht 5\' 10"  (1.778 m)   Wt 180 lb (81.6 kg)   BMI 25.83 kg/m  General: Well developed white male in no acute distress Head: Normocephalic and atraumatic Eyes  Sclerae anicteric, conjunctiva pink. Ears: Normal auditory acuity Lungs: Clear throughout to auscultation; no W/R/R. Heart: Regular rate and rhythm; no M/R/G. Abdomen: Soft, non-distended.  BS present.  Non-tender. Rectal:  no external abnormalities noted.  DRE did not reveal any masses, etc. Musculoskeletal:  Symmetrical with no gross deformities  Skin: No lesions on visible extremities Extremities: No edema  Neurological: Alert oriented x 4, grossly non-focal Psychological:  Alert and cooperative. Normal mood and affect  ASSESSMENT AND PLAN: *Gastritis: Patient has been on pantoprazole 40 mg daily.  Only finding on EGD in September 2021 was chronic gastritis.  He recently saw what he calls a functional medicine doctor.  Looks like holistic type medicine.  She wanted him to try to taper off of his PPI for 3 months.  He wanted to come in to discuss to see if we thought he would be okay to do that.  I do not think that there is any reason that he could not come off of it for period of time.  He does not report any acid/reflux type symptoms.  Certainly if symptoms were to return then we would recommend resuming the medication.  Another option would  be trying Pepcid a couple of times per day if that is a possibility. *Anal discomfort: No external abnormalities noted and no pain or significant abnormalities felt on DRE.  Anoscopy not performed as he is scheduled for colonoscopy in a couple of weeks.  We will try hydrocortisone cream 2.5% placed on Preparation H suppository to be used at nighttime for the next several days to see if that helps.  Prescription sent to pharmacy.   CC:  Caren Macadam, MD

## 2021-06-11 NOTE — Telephone Encounter (Signed)
Reviewed records with Dr. Burt Knack. Dr. Orvan Seen requested the patient stop Zetia for 3 months and decrease ASA to qod. Dr. Burt Knack disagrees with these instructions and recommends the patient takes as directed.  Left message for patient to call back.

## 2021-06-12 NOTE — Telephone Encounter (Signed)
Left message to call back  

## 2021-06-15 NOTE — Telephone Encounter (Signed)
Pt aware of Dr Antionette Char recommendations and agrees Will continue meds as directed ./cy

## 2021-06-18 ENCOUNTER — Other Ambulatory Visit: Payer: Self-pay

## 2021-06-18 ENCOUNTER — Encounter: Payer: Self-pay | Admitting: Gastroenterology

## 2021-06-18 ENCOUNTER — Ambulatory Visit (AMBULATORY_SURGERY_CENTER): Payer: Medicare Other | Admitting: Gastroenterology

## 2021-06-18 VITALS — BP 117/79 | HR 64 | Temp 98.6°F | Resp 11 | Ht 70.0 in | Wt 178.0 lb

## 2021-06-18 DIAGNOSIS — Z860100 Personal history of colon polyps, unspecified: Secondary | ICD-10-CM

## 2021-06-18 DIAGNOSIS — D128 Benign neoplasm of rectum: Secondary | ICD-10-CM

## 2021-06-18 DIAGNOSIS — D124 Benign neoplasm of descending colon: Secondary | ICD-10-CM

## 2021-06-18 DIAGNOSIS — D122 Benign neoplasm of ascending colon: Secondary | ICD-10-CM | POA: Diagnosis not present

## 2021-06-18 MED ORDER — SODIUM CHLORIDE 0.9 % IV SOLN: 500.0000 mL | INTRAVENOUS | Status: DC

## 2021-06-18 NOTE — Progress Notes (Signed)
To PACU, VSS. Report to [email or web address] 

## 2021-06-18 NOTE — Op Note (Signed)
[facility] Patient Name: Ronnie Vazquez Procedure Date: 06/18/2021 7:52 AM MRN: [id number] Endoscopist: Ladene Artist , MD Age: 75 Referring MD:  Date of Birth: 10-03-1946 Gender: Male Account #: [id number] Procedure:                Colonoscopy Indications:              Surveillance: Personal history of adenomatous                            polyps on last colonoscopy > 3 years ago Medicines:                Monitored Anesthesia Care Procedure:                Pre-Anesthesia Assessment:                           - Prior to the procedure, a History and Physical                            was performed, and patient medications and                            allergies were reviewed. The patient's tolerance of                            previous anesthesia was also reviewed. The risks                            and benefits of the procedure and the sedation                            options and risks were discussed with the patient.                            All questions were answered, and informed consent                            was obtained. Prior Anticoagulants: The patient has                            taken no previous anticoagulant or antiplatelet                            agents. ASA Grade Assessment: III - A patient with                            severe systemic disease. After reviewing the risks                            and benefits, the patient was deemed in                            satisfactory condition to undergo the procedure.  After obtaining informed consent, the colonoscope                            was passed under direct vision. Throughout the                            procedure, the patient's blood pressure, pulse, and                            oxygen saturations were monitored continuously. The                            Olympus CF-HQ190L [id number]) Colonoscope was                            introduced through the anus  and advanced to the the                            terminal ileum, with identification of the                            appendiceal orifice and IC valve. The ileocecal                            valve, appendiceal orifice, and rectum were                            photographed. The quality of the bowel preparation                            was good. The colonoscopy was performed without                            difficulty. The patient tolerated the procedure                            well. Scope In: 8:02:51 AM Scope Out: 8:18:55 AM Scope Withdrawal Time: 0 hours 13 minutes 31 seconds  Total Procedure Duration: 0 hours 16 minutes 4 seconds  Findings:                 The perianal and digital rectal examinations were                            normal.                           Three sessile polyps were found in the rectum,                            descending colon and ascending colon. The polyps                            were 6 to 9 mm in size. These polyps were removed  with a cold snare. Resection and retrieval were                            complete.                           Scattered small-mouthed diverticula were found in                            the right colon. There was no evidence of                            diverticular bleeding.                           Multiple medium-mouthed diverticula were found in                            the left colon. There was no evidence of                            diverticular bleeding.                           External hemorrhoids were found during                            retroflexion. The hemorrhoids were moderate.                           The exam was otherwise without abnormality on                            direct and retroflexion views. Complications:            No immediate complications. Estimated blood loss:                            None. Estimated Blood Loss:     Estimated blood loss:  none. Impression:               - Three 6 to 9 mm polyps in the rectum, in the                            descending colon and in the ascending colon,                            removed with a cold snare. Resected and retrieved.                           - Mild diverticulosis in the right colon.                           - Mild diverticulosis in the left colon.                           -  External hemorrhoids.                           - The examination was otherwise normal on direct                            and retroflexion views. Recommendation:           - Repeat colonoscopy, likely 3 years, after studies                            are complete for surveillance based on pathology                            results.                           - Patient has a contact number available for                            emergencies. The signs and symptoms of potential                            delayed complications were discussed with the                            patient. Return to normal activities tomorrow.                            Written discharge instructions were provided to the                            patient.                           - High fiber diet.                           - Continue present medications.                           - Await pathology results. Ladene Artist, MD 06/18/2021 8:23:17 AM This report has been signed electronically.

## 2021-06-18 NOTE — Progress Notes (Signed)
Vitals-CW  Pt's states no medical or surgical changes since previsit or office visit.   Called to room to assist during endoscopic procedure.  Patient ID and intended procedure confirmed with present staff. Received instructions for my participation in the procedure from the performing physician.

## 2021-06-18 NOTE — Patient Instructions (Signed)
Handouts given:  diverticulosis, hemorrhoids, polyps Start high fiber diet Continue current medications Await pathology results  YOU HAD AN ENDOSCOPIC PROCEDURE TODAY AT [facility]:   Refer to the procedure report that was given to you for any specific questions about what was found during the examination.  If the procedure report does not answer your questions, please call your gastroenterologist to clarify.  If you requested that your care partner not be given the details of your procedure findings, then the procedure report has been included in a sealed envelope for you to review at your convenience later.  YOU SHOULD EXPECT: Some feelings of bloating in the abdomen. Passage of more gas than usual.  Walking can help get rid of the air that was put into your GI tract during the procedure and reduce the bloating. If you had a lower endoscopy (such as a colonoscopy or flexible sigmoidoscopy) you may notice spotting of blood in your stool or on the toilet paper. If you underwent a bowel prep for your procedure, you may not have a normal bowel movement for a few days.  Please Note:  You might notice some irritation and congestion in your nose or some drainage.  This is from the oxygen used during your procedure.  There is no need for concern and it should clear up in a day or so.  SYMPTOMS TO REPORT IMMEDIATELY:  Following lower endoscopy (colonoscopy or flexible sigmoidoscopy):  Excessive amounts of blood in the stool  Significant tenderness or worsening of abdominal pains  Swelling of the abdomen that is new, acute  Fever of 100F or higher  For urgent or emergent issues, a gastroenterologist can be reached at any hour by calling [phone]. Do not use MyChart messaging for urgent concerns.   DIET:  We do recommend a small meal at first, but then you may proceed to your regular diet.  Drink plenty of fluids but you should avoid alcoholic beverages for 24  hours.  ACTIVITY:  You should plan to take it easy for the rest of today and you should NOT DRIVE or use heavy machinery until tomorrow (because of the sedation medicines used during the test).    FOLLOW UP: Our staff will call the number listed on your records 48-72 hours following your procedure to check on you and address any questions or concerns that you may have regarding the information given to you following your procedure. If we do not reach you, we will leave a message.  We will attempt to reach you two times.  During this call, we will ask if you have developed any symptoms of COVID 19. If you develop any symptoms (ie: fever, flu-like symptoms, shortness of breath, cough etc.) before then, please call [phone].  If you test positive for Covid 19 in the 2 weeks post procedure, please call and report this information to [place].    If any biopsies were taken you will be contacted by phone or by letter within the next 1-3 weeks.  Please call us at [phone] if you have not heard about the biopsies in 3 weeks.   SIGNATURES/CONFIDENTIALITY: You and/or your care partner have signed paperwork which will be entered into your electronic medical record.  These signatures attest to the fact that that the information above on your After Visit Summary has been reviewed and is understood.  Full responsibility of the confidentiality of this discharge information lies with you and/or your care-partner.

## 2021-06-22 ENCOUNTER — Telehealth: Payer: Self-pay | Admitting: *Deleted

## 2021-06-22 NOTE — Telephone Encounter (Signed)
  Follow up Call-  Call back number 06/18/2021 09/09/2020  Post procedure Call Back phone  # [phone]  Permission to leave phone message Yes Yes  Some recent data might be hidden     Patient questions:  Do you have a fever, pain , or abdominal swelling? No. Pain Score  0 *  Have you tolerated food without any problems? Yes.    Have you been able to return to your normal activities? Yes.    Do you have any questions about your discharge instructions: Diet   No. Medications  No. Follow up visit  No.  Do you have questions or concerns about your Care? No.  Actions: * If pain score is 4 or above: No action needed, pain <4.  Have you developed a fever since your procedure? no  2.   Have you had an respiratory symptoms (SOB or cough) since your procedure? no  3.   Have you tested positive for COVID 19 since your procedure no  4.   Have you had any family members/close contacts diagnosed with the COVID 19 since your procedure?  no   If yes to any of these questions please route to Joylene Wilver, RN and Joella Prince, RN

## 2021-06-29 ENCOUNTER — Encounter: Payer: Self-pay | Admitting: Gastroenterology

## 2021-07-07 ENCOUNTER — Other Ambulatory Visit: Payer: Self-pay | Admitting: Cardiovascular Disease

## 2021-07-17 DIAGNOSIS — B353 Tinea pedis: Secondary | ICD-10-CM | POA: Diagnosis not present

## 2021-07-17 DIAGNOSIS — D485 Neoplasm of uncertain behavior of skin: Secondary | ICD-10-CM | POA: Diagnosis not present

## 2021-07-17 DIAGNOSIS — L081 Erythrasma: Secondary | ICD-10-CM | POA: Diagnosis not present

## 2021-07-17 DIAGNOSIS — L570 Actinic keratosis: Secondary | ICD-10-CM | POA: Diagnosis not present

## 2021-07-17 DIAGNOSIS — L821 Other seborrheic keratosis: Secondary | ICD-10-CM | POA: Diagnosis not present

## 2021-07-17 DIAGNOSIS — D225 Melanocytic nevi of trunk: Secondary | ICD-10-CM | POA: Diagnosis not present

## 2021-07-17 DIAGNOSIS — D1801 Hemangioma of skin and subcutaneous tissue: Secondary | ICD-10-CM | POA: Diagnosis not present

## 2021-07-17 DIAGNOSIS — L814 Other melanin hyperpigmentation: Secondary | ICD-10-CM | POA: Diagnosis not present

## 2021-08-11 DIAGNOSIS — D849 Immunodeficiency, unspecified: Secondary | ICD-10-CM | POA: Diagnosis not present

## 2021-08-11 DIAGNOSIS — K2100 Gastro-esophageal reflux disease with esophagitis, without bleeding: Secondary | ICD-10-CM | POA: Diagnosis not present

## 2021-08-11 DIAGNOSIS — K200 Eosinophilic esophagitis: Secondary | ICD-10-CM | POA: Diagnosis not present

## 2021-08-11 DIAGNOSIS — Z91018 Allergy to other foods: Secondary | ICD-10-CM | POA: Diagnosis not present

## 2021-08-11 DIAGNOSIS — E782 Mixed hyperlipidemia: Secondary | ICD-10-CM | POA: Diagnosis not present

## 2021-09-02 DIAGNOSIS — H11121 Conjunctival concretions, right eye: Secondary | ICD-10-CM | POA: Diagnosis not present

## 2021-09-02 DIAGNOSIS — H5211 Myopia, right eye: Secondary | ICD-10-CM | POA: Diagnosis not present

## 2021-09-02 DIAGNOSIS — H52221 Regular astigmatism, right eye: Secondary | ICD-10-CM | POA: Diagnosis not present

## 2021-09-14 ENCOUNTER — Telehealth: Payer: Self-pay | Admitting: Cardiovascular Disease

## 2021-09-14 NOTE — Telephone Encounter (Addendum)
Pt called to report that he has tested positive for COVID this morning and he has somewhat mild symptoms such as sore throat, congestion, but the body aches have been his biggest complaint. No fever.   He takes Advil but I have suggested that he take Tylenol but he says it doesn't always help but will talk with his PCP.   He says he randomly checked his Pulse OX and his O2 is 99% but his heartrate is in the low 90's... he denies fever, no palpitations, no chest pain or dizziness.   I had him check his radial pulse while on the phone with me while I timed a minute and he says it was 60... he says it felt regular.   He will hydrate, rest, and continue to monitor and if anything worsens will call our office back.

## 2021-09-14 NOTE — Telephone Encounter (Signed)
New Message:     Patient says he have Covid. This morning he checked his heart rate and it was high. He is concerned because he have Covid and wonder if this is making his heart rate high   STAT if HR is under 50 or over 120 (normal HR is 60-100 beats per minute)  What is your heart rate? 97,97 amd 85  Do you have a log of your heart rate readings (document readings)? no  Do you have any other symptoms? No-

## 2021-09-16 ENCOUNTER — Encounter: Payer: Self-pay | Admitting: Family Medicine

## 2021-11-04 ENCOUNTER — Telehealth: Payer: Self-pay | Admitting: Family Medicine

## 2021-11-04 NOTE — Telephone Encounter (Signed)
Left a message for the patient to return my call.  

## 2021-11-04 NOTE — Telephone Encounter (Signed)
Spoke with the patient and advised he go to an urgent care for evaluation due to the symptoms below as none of our providers have any openings today.  Patient stated he is not feeling that bad, denies pain or other symptoms and I offered an appt tomorrow with Dr Volanda Napoleon.  Patient declined as he will be playing golf.  Appt scheduled for 11/11.

## 2021-11-04 NOTE — Telephone Encounter (Signed)
Patient came in asking if he could speak to Ronnie Vazquez about an issue he has been having. Patient says that he has been using the bathroom about 25 times a day and may need to be referred to a urologist. Patient has been scheduled for a visit on 12/5 but would like to speak to Ronnie Vazquez if possible beforehand.  Patient would like a call at [phone].  Please advise.

## 2021-11-06 ENCOUNTER — Ambulatory Visit (INDEPENDENT_AMBULATORY_CARE_PROVIDER_SITE_OTHER): Payer: Medicare Other | Admitting: Family Medicine

## 2021-11-06 VITALS — BP 112/78 | HR 105 | Temp 98.6°F | Wt 186.6 lb

## 2021-11-06 DIAGNOSIS — R000 Tachycardia, unspecified: Secondary | ICD-10-CM | POA: Diagnosis not present

## 2021-11-06 DIAGNOSIS — R350 Frequency of micturition: Secondary | ICD-10-CM | POA: Diagnosis not present

## 2021-11-06 DIAGNOSIS — I2510 Atherosclerotic heart disease of native coronary artery without angina pectoris: Secondary | ICD-10-CM | POA: Diagnosis not present

## 2021-11-06 DIAGNOSIS — N401 Enlarged prostate with lower urinary tract symptoms: Secondary | ICD-10-CM | POA: Diagnosis not present

## 2021-11-06 DIAGNOSIS — N3943 Post-void dribbling: Secondary | ICD-10-CM | POA: Diagnosis not present

## 2021-11-06 LAB — POCT URINALYSIS DIPSTICK
Bilirubin, UA: NEGATIVE
Blood, UA: NEGATIVE
Glucose, UA: NEGATIVE
Ketones, UA: NEGATIVE
Leukocytes, UA: NEGATIVE
Nitrite, UA: NEGATIVE
Protein, UA: NEGATIVE
Spec Grav, UA: 1.02 (ref 1.010–1.025)
Urobilinogen, UA: NEGATIVE E.U./dL — AB
pH, UA: 6 (ref 5.0–8.0)

## 2021-11-06 MED ORDER — TAMSULOSIN HCL 0.4 MG PO CAPS: 0.4000 mg | ORAL_CAPSULE | Freq: Every day | ORAL | 0 refills | Status: DC

## 2021-11-06 NOTE — Progress Notes (Signed)
Subjective:    Patient ID: Ronnie Vazquez, male    DOB: [birth year], 75 y.o.   MRN: [id number]  Chief Complaint  Patient presents with   Urinary Frequency    States he had a PSA about 6 months ago, and it has gotten worse since then and is going more frequently. Had a colonoscopy and he states he can feel colon getting full.    HPI Patient was seen today for ongoing concern.  Pt endorses urinary dribbling x 6 -7 months. Now having urinary frequency x 2 months.  Urinating during the day q 1-1.5 hrs and once at night.  Denies dysuria.  Having 1 cup of coffee per day and water.  Last PSA 6 months ago per pt.  Past Medical History:  Diagnosis Date   Arthritis    bilateral feet   Atrial flutter ([place])    Echo 9/21: EF 60-65, no RWMA, normal RVSF, trivial MR, borderline dilation of aorta at sinus of Valsalva (37 mm), mild LAE, RVSP 26.8   Chicken pox    hx   Coronary artery disease    08/29/18 PCI/DES with bifurcation from LAD into 1st diag.    GERD (gastroesophageal reflux disease)    with certain foods/on OTC meds   Hyperlipidemia    on meds   Left shoulder pain 02/02/2013   Microscopic hematuria 02/02/2013   Vision loss of left eye     Allergies  Allergen Reactions   Repatha [Evolocumab] Other (See Comments)    Myalgias   Bempedoic Acid Other (See Comments)    (Nexletol) muscle pain.   Crestor [Rosuvastatin Calcium] Other (See Comments)    Myalgias (muscle/joint pain)   Statins     Other reaction(s): Other (See Comments), Other (See Comments) Myalgias (muscle/joint pain) Muscle aches Pt stated "goes to my muscles"     ROS General: Denies fever, chills, night sweats, changes in weight, changes in appetite HEENT: Denies headaches, ear pain, changes in vision, rhinorrhea, sore throat CV: Denies CP, palpitations, SOB, orthopnea Pulm: Denies SOB, cough, wheezing GI: Denies abdominal pain, nausea, vomiting, diarrhea, constipation GU: Denies dysuria, hematuria  +urinary frequency and  dribbling Msk: Denies muscle cramps, joint pains Neuro: Denies weakness, numbness, tingling Skin: Denies rashes, bruising Psych: Denies depression, anxiety, hallucinations     Objective:    Blood pressure 112/78, pulse (!) 105, temperature 98.6 F (37 C), temperature source Oral, weight 186 lb 9.6 oz (84.6 kg), SpO2 95 %.  Gen. Pleasant, well-nourished, in no distress, normal affect   HEENT: [place]/AT, face symmetric, conjunctiva clear, no scleral icterus, PERRLA, EOMI, nares patent without drainage Lungs: no accessory muscle use, CTAB, no wheezes or rales Cardiovascular: Tachycardia, no m/r/g, no peripheral edema Musculoskeletal: No deformities, no cyanosis or clubbing, normal tone Neuro:  A&Ox3, CN II-XII intact, normal gait Skin:  Warm, no lesions/ rash   Wt Readings from Last 3 Encounters:  11/06/21 186 lb 9.6 oz (84.6 kg)  06/18/21 178 lb (80.7 kg)  06/04/21 180 lb (81.6 kg)    Lab Results  Component Value Date   WBC 6.4 10/21/2020   HGB 15.3 10/21/2020   HCT 44.8 10/21/2020   PLT 270 10/21/2020   GLUCOSE 82 10/21/2020   CHOL 175 05/04/2021   TRIG 129 05/04/2021   HDL 48 05/04/2021   LDLDIRECT 117.0 08/12/2017   LDLCALC 104 (H) 05/04/2021   ALT 18 05/04/2021   AST 19 05/04/2021   NA 140 10/21/2020   K 4.8 10/21/2020   CL 105  10/21/2020   CREATININE 1.40 (H) 10/21/2020   BUN 29 (H) 10/21/2020   CO2 28 10/21/2020   TSH 2.03 02/20/2020   PSA 1.49 10/27/2020   INR 1.05 08/10/2018   HGBA1C 5.4 08/10/2018    Assessment/Plan:  Urinary dribbling -discussed possibly causes  -UA negative -start flomax for BPH -given handout  - Plan: tamsulosin (FLOMAX) 0.4 MG CAPS capsule, POCT urinalysis dipstick  Benign prostatic hyperplasia with urinary frequency  -start flomax -f/u with pcp. Urology for continued symptoms - Plan: tamsulosin (FLOMAX) 0.4 MG CAPS capsule  Tachycardia -asymptomatic -h/o aflutter.  Also consider nervousness -continue to monitor  F/u with  pcp  Grier Mitts, MD

## 2021-11-22 ENCOUNTER — Encounter: Payer: Self-pay | Admitting: Family Medicine

## 2021-11-25 ENCOUNTER — Encounter: Payer: Self-pay | Admitting: Gastroenterology

## 2021-11-25 ENCOUNTER — Ambulatory Visit (INDEPENDENT_AMBULATORY_CARE_PROVIDER_SITE_OTHER): Payer: Medicare Other | Admitting: Gastroenterology

## 2021-11-25 VITALS — BP 120/70 | HR 78 | Ht 70.0 in | Wt 185.0 lb

## 2021-11-25 DIAGNOSIS — K649 Unspecified hemorrhoids: Secondary | ICD-10-CM

## 2021-11-25 DIAGNOSIS — I2510 Atherosclerotic heart disease of native coronary artery without angina pectoris: Secondary | ICD-10-CM

## 2021-11-25 NOTE — Progress Notes (Signed)
11/25/2021 THUNDER BRIDGEWATER [id number] 03-17-1946   HISTORY OF PRESENT ILLNESS: This is a 76 year old male who is a patient of Dr. Lynne Leader.  He had a colonoscopy back in the summer where he had diverticulosis, hemorrhoids, and a few small polyps that were removed, tubular adenomas and one hyperplastic polyp.  He says that he never received a letter in the mail with his pathology results.  He wanted to come in today mostly to review his results.  He had several things underlined in the actual procedure report.  He states that he has been using some Preparation H cream, but is not sure that he is actually inserting it far enough to treat any internal hemorrhoids.  He reports some pelvic discomfort over the past several months.  Michela Pitcher is not painful almost sounds like a pressure sensation.  He also reports an increase in urinary frequency and urgency recently.  He says that his PSA level was normal.  He has an appointment with his PCP next week.  Past Medical History:  Diagnosis Date   Arthritis    bilateral feet   Atrial flutter ([place])    Echo 9/21: EF 60-65, no RWMA, normal RVSF, trivial MR, borderline dilation of aorta at sinus of Valsalva (37 mm), mild LAE, RVSP 26.8   Chicken pox    hx   Coronary artery disease    08/29/18 PCI/DES with bifurcation from LAD into 1st diag.    GERD (gastroesophageal reflux disease)    with certain foods/on OTC meds   Hyperlipidemia    on meds   Left shoulder pain 02/02/2013   Microscopic hematuria 02/02/2013   Vision loss of left eye    Past Surgical History:  Procedure Laterality Date   A-FLUTTER ABLATION N/A 10/24/2020   Procedure: A-FLUTTER ABLATION;  Surgeon: Deboraha Sprang, MD;  Location: [facility];  Service: Cardiovascular;  Laterality: N/A;   COLONOSCOPY  10/2017   MS-suprep(good)-SSA/TICS/hems/TA's-3 yr recall   CORONARY BALLOON ANGIOPLASTY N/A 08/29/2018   Procedure: CORONARY BALLOON ANGIOPLASTY;  Surgeon: Sherren Mocha, MD;  Location:  [facility];  Service: Cardiovascular;  Laterality: N/A;   CORONARY STENT INTERVENTION N/A 08/29/2018   Procedure: CORONARY STENT INTERVENTION;  Surgeon: Sherren Mocha, MD;  Location: [facility];  Service: Cardiovascular;  Laterality: N/A;   ESOPHAGEAL MANOMETRY N/A 10/08/2020   Procedure: ESOPHAGEAL MANOMETRY (EM);  Surgeon: Ladene Artist, MD;  Location: [facility];  Service: Endoscopy;  Laterality: N/A;   LEFT HEART CATH AND CORONARY ANGIOGRAPHY N/A 08/08/2018   Procedure: LEFT HEART CATH AND CORONARY ANGIOGRAPHY;  Surgeon: Lorretta Harp, MD;  Location: [facility];  Service: Cardiovascular;  Laterality: N/A;   POLYPECTOMY  10/2017   SSA/TA's   TONSILLECTOMY AND ADENOIDECTOMY  1959   WISDOM TOOTH EXTRACTION  1975    reports that he has never smoked. He has never used smokeless tobacco. He reports current alcohol use of about 1.0 standard drink per week. He reports that he does not use drugs. family history includes Arthritis in his mother; Arthritis/Rheumatoid in his brother; Prostate cancer in his father. Allergies  Allergen Reactions   Repatha [Evolocumab] Other (See Comments)    Myalgias   Bempedoic Acid Other (See Comments)    (Nexletol) muscle pain.   Crestor [Rosuvastatin Calcium] Other (See Comments)    Myalgias (muscle/joint pain)   Statins     Other reaction(s): Other (See Comments), Other (See Comments) Myalgias (muscle/joint pain) Muscle aches Pt  stated "goes to my muscles"       Outpatient Encounter Medications as of 11/25/2021  Medication Sig   aspirin EC 81 MG tablet Take 81 mg by mouth. Only takes occasionally Swallow whole.   clotrimazole (ANTIFUNGAL, CLOTRIMAZOLE,) 1 % cream Apply 1 application topically 2 (two) times daily.   Coenzyme Q10 (CO Q 10) 100 MG CAPS Take 200 mg by mouth daily.   Cyanocobalamin (VITAMIN B-12 PO) Take 1 tablet by mouth daily.   ezetimibe (ZETIA) 10 MG tablet TAKE 1 TABLET BY MOUTH EVERY DAY    hydrocortisone (ANUSOL-HC) 2.5 % rectal cream Place 1 application rectally 2 (two) times daily.   nitroGLYCERIN (NITROSTAT) 0.4 MG SL tablet Place 1 tablet (0.4 mg total) under the tongue every 5 (five) minutes as needed for chest pain.   pantoprazole (PROTONIX) 40 MG tablet Take 40 mg by mouth daily.   tamsulosin (FLOMAX) 0.4 MG CAPS capsule Take 1 capsule (0.4 mg total) by mouth daily.   triamcinolone (KENALOG) 0.1 % Apply 1 application topically 2 (two) times daily. (Patient taking differently: Apply 1 application topically 2 (two) times daily as needed.)   No facility-administered encounter medications on file as of 11/25/2021.     REVIEW OF SYSTEMS  : All other systems reviewed and negative except where noted in the History of Present Illness.   PHYSICAL EXAM: BP 120/70   Pulse 78   Ht 5\' 10"  (1.778 m)   Wt 185 lb (83.9 kg)   BMI 26.54 kg/m  General: Well developed white male in no acute distress Head: Normocephalic and atraumatic Eyes:  Sclerae anicteric, conjunctiva pink. Ears: Normal auditory acuity Lungs: Clear throughout to auscultation; no W/R/R. Heart: Regular rate and rhythm; no M/R/G. Abdomen: Soft, non-distended.  BS present.  Non-tender. Musculoskeletal: Symmetrical with no gross deformities  Skin: No lesions on visible extremities Extremities: No edema  Neurological: Alert oriented x 4, grossly non-focal Psychological:  Alert and cooperative. Normal mood and affect  ASSESSMENT AND PLAN: *75 year old male with hemorrhoids and complaints of what sounds like a pelvic pressure/discomfort.  Main purpose of his visit today was that he wanted to discuss specific words/terminology/results of his colonoscopy and pathology.  We reviewed all of that.  We discussed using Preparation H suppositories rather than Preparation H cream to try to treat any internal hemorrhoid discomfort.  He also reports recent increase in urinary frequency and urgency.  He is going to discuss with  his PCP about prostate exam or seeing a urologist.  Reports his PSA was normal recently.  We will follow-up here as needed for now.   CC:  Caren Macadam, MD

## 2021-11-25 NOTE — Patient Instructions (Addendum)
Use Preparation H suppositories as needed  Follow as needed  If you are age 73 or older, your body mass index should be between 23-30. Your Body mass index is 26.54 kg/m. If this is out of the aforementioned range listed, please consider follow up with your Primary Care Provider.  If you are age 10 or younger, your body mass index should be between 19-25. Your Body mass index is 26.54 kg/m. If this is out of the aformentioned range listed, please consider follow up with your Primary Care Provider.   ________________________________________________________  The [facility] providers would like to encourage you to use [facility] to communicate with providers for non-urgent requests or questions.  Due to long hold times on the telephone, sending your provider a message by [facility] may be a faster and more efficient way to get a response.  Please allow 48 business hours for a response.  Please remember that this is for non-urgent requests.  _______________________________________________________   I appreciate the  opportunity to care for you  Thank You   Janett Billow Zehr,PA-C

## 2021-11-30 ENCOUNTER — Encounter: Payer: Self-pay | Admitting: Family Medicine

## 2021-11-30 ENCOUNTER — Ambulatory Visit (INDEPENDENT_AMBULATORY_CARE_PROVIDER_SITE_OTHER): Payer: Medicare Other | Admitting: Family Medicine

## 2021-11-30 VITALS — BP 98/68 | HR 71 | Temp 98.3°F | Ht 70.0 in | Wt 190.3 lb

## 2021-11-30 DIAGNOSIS — R350 Frequency of micturition: Secondary | ICD-10-CM

## 2021-11-30 NOTE — Progress Notes (Signed)
Ronnie Vazquez DOB: Feb 05, 1946 Encounter date: 11/30/2021  This is a 75 y.o. male who presents with Chief Complaint  Patient presents with   Urinary Frequency    Patient complains of urinary frequency for the past few months, seen by Dr Volanda Napoleon, no infection noted, given Rx for Flomax and discontinued after one dose due to dizziness     History of present illness:  Still urinating more often. 4 hours after taking the flomax ended up holding tree due to dizziness. Scared him enough he didn't want to take another pill.   Dribbling a little of urine sometimes before getting to the bathroom. Not hard to start urine stream. States that stream is good. Going every 1-2 hours during the day. Wakes just once at night. Drinking about 4 large glasses of water daily. Does feel like this is a little more than baseline; but started this after having swallowing issues as way to try and relieve swallowing symptoms.  Did go to [facility] for follow up with swallow. Situation overall is not much better. Feels that he can feel foot at end of digestive process going into colon. Hemorrhoid aches every once in awhile, but feels it is a little deeper. Saw GI recently for follow up because he states never got letter.    Allergies  Allergen Reactions   Repatha [Evolocumab] Other (See Comments)    Myalgias   Bempedoic Acid Other (See Comments)    (Nexletol) muscle pain.   Crestor [Rosuvastatin Calcium] Other (See Comments)    Myalgias (muscle/joint pain)   Statins     Other reaction(s): Other (See Comments), Other (See Comments) Myalgias (muscle/joint pain) Muscle aches Pt stated "goes to my muscles"    Current Meds  Medication Sig   aspirin EC 81 MG tablet Take 81 mg by mouth. Only takes occasionally Swallow whole.   clotrimazole (ANTIFUNGAL, CLOTRIMAZOLE,) 1 % cream Apply 1 application topically 2 (two) times daily.   ezetimibe (ZETIA) 10 MG tablet TAKE 1 TABLET BY MOUTH EVERY DAY   hydrocortisone (ANUSOL-HC)  2.5 % rectal cream Place 1 application rectally 2 (two) times daily.   triamcinolone (KENALOG) 0.1 % Apply 1 application topically 2 (two) times daily. (Patient taking differently: Apply 1 application topically 2 (two) times daily as needed.)    Review of Systems  Constitutional:  Negative for chills, fatigue and fever.  Respiratory:  Negative for cough, chest tightness, shortness of breath and wheezing.   Cardiovascular:  Negative for chest pain, palpitations and leg swelling.  Genitourinary:  Positive for difficulty urinating and frequency (during day). Negative for dysuria, flank pain, hematuria and penile discharge.   Objective:  BP 98/68 (BP Location: Left Arm, Patient Position: Sitting, Cuff Size: Large)   Pulse 71   Temp 98.3 F (36.8 C) (Oral)   Ht 5\' 10"  (1.778 m)   Wt 190 lb 4.8 oz (86.3 kg)   SpO2 96%   BMI 27.31 kg/m   Weight: 190 lb 4.8 oz (86.3 kg)   BP Readings from Last 3 Encounters:  11/30/21 98/68  11/25/21 120/70  11/06/21 112/78   Wt Readings from Last 3 Encounters:  11/30/21 190 lb 4.8 oz (86.3 kg)  11/25/21 185 lb (83.9 kg)  11/06/21 186 lb 9.6 oz (84.6 kg)    Physical Exam Constitutional:      General: He is not in acute distress.    Appearance: He is well-developed.  Cardiovascular:     Rate and Rhythm: Normal rate and regular rhythm.  Heart sounds: Normal heart sounds. No murmur heard.   No friction rub.  Pulmonary:     Effort: Pulmonary effort is normal. No respiratory distress.     Breath sounds: Normal breath sounds. No wheezing or rales.  Genitourinary:    Penis: Normal and circumcised.      Testes: Normal.     Prostate: Enlarged (4;).  Musculoskeletal:     Right lower leg: No edema.     Left lower leg: No edema.  Neurological:     Mental Status: He is alert and oriented to person, place, and time.  Psychiatric:        Behavior: Behavior normal.    Assessment/Plan  1. Urinary frequency We discussed trying to take flomax 30  minutes after eating to help decrease side effects. Discussed taking after dinner so that if bp does lower a little; he will not feel as symptomatic. He will try this. We also discussed that it is reasonable to have further evaluation with urology.  Typically with enlarged prostate, nighttime symptoms tend to be a little bit worse.  He is having very frequent daytime symptoms without significant fluid intake to warrant this frequency.  I am concerned that he may not be emptying completely. - Ambulatory referral to Urology   Return in about 3 months (around 02/28/2022) for Chronic condition visit. 35 minutes spent in chart review, discussion of symptoms, discussion of potential urology evaluation/procedures, charting, exam.    Micheline Rough, MD

## 2021-11-30 NOTE — Patient Instructions (Addendum)
Try taking the flomax 30 minutes after dinner and see if that helps with daytime frequency.   Consider trying metamucil on daily or every other day basis to help with moving food through bowels.

## 2021-12-09 DIAGNOSIS — Z23 Encounter for immunization: Secondary | ICD-10-CM | POA: Diagnosis not present

## 2022-01-12 DIAGNOSIS — M1711 Unilateral primary osteoarthritis, right knee: Secondary | ICD-10-CM | POA: Diagnosis not present

## 2022-01-13 DIAGNOSIS — R351 Nocturia: Secondary | ICD-10-CM | POA: Diagnosis not present

## 2022-01-13 DIAGNOSIS — R3915 Urgency of urination: Secondary | ICD-10-CM | POA: Diagnosis not present

## 2022-01-13 DIAGNOSIS — N401 Enlarged prostate with lower urinary tract symptoms: Secondary | ICD-10-CM | POA: Diagnosis not present

## 2022-01-13 DIAGNOSIS — R350 Frequency of micturition: Secondary | ICD-10-CM | POA: Diagnosis not present

## 2022-01-20 ENCOUNTER — Ambulatory Visit: Payer: Medicare Other

## 2022-01-21 DIAGNOSIS — M48061 Spinal stenosis, lumbar region without neurogenic claudication: Secondary | ICD-10-CM | POA: Diagnosis not present

## 2022-01-21 NOTE — Progress Notes (Signed)
Cardiology Office Note   Date:  01/25/2022   ID:  Ronnie Vazquez, DOB 04/22/1946, MRN [id number]  PCP:  Ronnie Macadam, MD  Cardiologist: Dr. Sherren Mocha, MD   Chief Complaint  Patient presents with   Follow-up    1 year follow up    History of Present Illness: Ronnie Vazquez is a 76 y.o. male who presents for 1 year follow up, seen for Ronnie Vazquez.   Ronnie Vazquez has a hx of CAD with history of LAD PCI in 2019, atrial flutter s/p ablation 10/24/20, GERD, and HLD. He was last seen by Dr Ronnie Vazquez 01/16/21 at which time he was doing very well with no recurrent anginal symptoms. He has a known intolerance to statins as well as PCSK9 inhibitors and has therefore been maintained on Zetia. He was trailed on coenzyme Q10 with repeat Lipid and LFTs after three months of therapy. This showed an LDL at 101 (was previously 91 from 09/2019).   Today he presents today for follow up and reports that he has been doing very well since last seen. He did stop Zetia briefly after seeing a transitional medicine doctor however restarted on his own after LDL went up. He has been tolerating this well. He continues to exercise regularly without anginal symptoms. Denies SOB, palpitations, LE edema, orthopnea, dizziness, or syncope.   Past Medical History:  Diagnosis Date   Arthritis    bilateral feet   Atrial flutter ([place])    Echo 9/21: EF 60-65, no RWMA, normal RVSF, trivial MR, borderline dilation of aorta at sinus of Valsalva (37 mm), mild LAE, RVSP 26.8   Chicken pox    hx   Coronary artery disease    08/29/18 PCI/DES with bifurcation from LAD into 1st diag.    GERD (gastroesophageal reflux disease)    with certain foods/on OTC meds   Hyperlipidemia    on meds   Left shoulder pain 02/02/2013   Microscopic hematuria 02/02/2013   Vision loss of left eye     Past Surgical History:  Procedure Laterality Date   A-FLUTTER ABLATION N/A 10/24/2020   Procedure: A-FLUTTER ABLATION;  Surgeon: Ronnie Sprang, MD;  Location: [facility];  Service: Cardiovascular;  Laterality: N/A;   COLONOSCOPY  10/2017   MS-suprep(good)-SSA/TICS/hems/TA's-3 yr recall   CORONARY BALLOON ANGIOPLASTY N/A 08/29/2018   Procedure: CORONARY BALLOON ANGIOPLASTY;  Surgeon: Sherren Mocha, MD;  Location: [facility];  Service: Cardiovascular;  Laterality: N/A;   CORONARY STENT INTERVENTION N/A 08/29/2018   Procedure: CORONARY STENT INTERVENTION;  Surgeon: Sherren Mocha, MD;  Location: [facility];  Service: Cardiovascular;  Laterality: N/A;   ESOPHAGEAL MANOMETRY N/A 10/08/2020   Procedure: ESOPHAGEAL MANOMETRY (EM);  Surgeon: Ladene Artist, MD;  Location: [facility];  Service: Endoscopy;  Laterality: N/A;   LEFT HEART CATH AND CORONARY ANGIOGRAPHY N/A 08/08/2018   Procedure: LEFT HEART CATH AND CORONARY ANGIOGRAPHY;  Surgeon: Lorretta Harp, MD;  Location: [facility];  Service: Cardiovascular;  Laterality: N/A;   POLYPECTOMY  10/2017   SSA/TA's   TONSILLECTOMY AND ADENOIDECTOMY  1959   WISDOM TOOTH EXTRACTION  1975     Current Outpatient Medications  Medication Sig Dispense Refill   aspirin EC 81 MG tablet Take 81 mg by mouth. Only takes occasionally Swallow whole.     clotrimazole (ANTIFUNGAL, CLOTRIMAZOLE,) 1 % cream Apply 1 application topically 2 (two) times daily. 30 g 2   ezetimibe (ZETIA)  10 MG tablet TAKE 1 TABLET BY MOUTH EVERY DAY 90 tablet 2   hydrocortisone (ANUSOL-HC) 2.5 % rectal cream Place 1 application rectally 2 (two) times daily. 30 g 1   nitroGLYCERIN (NITROSTAT) 0.4 MG SL tablet Place 1 tablet (0.4 mg total) under the tongue every 5 (five) minutes as needed for chest pain. 25 tablet 6   triamcinolone (KENALOG) 0.1 % Apply 1 application topically 2 (two) times daily. 30 g 0   No current facility-administered medications for this visit.    Allergies:   Repatha [evolocumab], Bempedoic acid, Crestor [rosuvastatin calcium], and Statins    Social History:   The patient  reports that he has never smoked. He has never used smokeless tobacco. He reports current alcohol use of about 1.0 standard drink per week. He reports that he does not use drugs.   Family History:  The patient's family history includes Arthritis in his mother; Arthritis/Rheumatoid in his brother; Prostate cancer in his father.    ROS:  Please see the history of present illness.   Otherwise, review of systems are positive for none.   All other systems are reviewed and negative.    PHYSICAL EXAM: VS:  BP 110/80 (BP Location: Left Arm, Patient Position: Sitting, Cuff Size: Normal)    Pulse 68    Ht 5\' 10"  (1.778 m)    Wt 191 lb (86.6 kg)    BMI 27.41 kg/m  , BMI Body mass index is 27.41 kg/m.  General: Well developed, well nourished, NAD Neck: Negative for carotid bruits. No JVD Lungs:Clear to ausculation bilaterally. No wheezes, rales, or rhonchi. Breathing is unlabored. Cardiovascular: RRR with S1 S2. No murmurs Extremities: No edema.  Neuro: Alert and oriented. No focal deficits. No facial asymmetry. MAE spontaneously. Psych: Responds to questions appropriately with normal affect.    EKG:  EKG is ordered today. The ekg ordered today demonstrates NSR, HR 68bpm  Recent Labs: 05/04/2021: ALT 18   Lipid Panel    Component Value Date/Time   CHOL 175 05/04/2021 0933   TRIG 129 05/04/2021 0933   HDL 48 05/04/2021 0933   CHOLHDL 3.6 05/04/2021 0933   CHOLHDL 4.2 10/13/2020 0854   VLDL 29.0 06/15/2019 0809   LDLCALC 104 (H) 05/04/2021 0933   LDLCALC 106 (H) 10/13/2020 0854   LDLDIRECT 117.0 08/12/2017 0921    Wt Readings from Last 3 Encounters:  01/25/22 191 lb (86.6 kg)  11/30/21 190 lb 4.8 oz (86.3 kg)  11/25/21 185 lb (83.9 kg)     Other studies Reviewed: Additional studies/ records that were reviewed today include: . Review of the above records demonstrates:    Echocardiogram 09/12/20:  1. Left ventricular ejection fraction, by estimation, is 60 to 65%. The  left ventricle has normal function. The left ventricle has no regional wall motion abnormalities. Indeterminant diastolic function due to atrial flutter. 2. Right ventricular systolic function is normal. The right ventricular size is normal. There is normal pulmonary artery systolic pressure. 3. The mitral valve is normal in structure. Trivial mitral valve regurgitation. No evidence of mitral stenosis. 4. The aortic valve is normal in structure. Aortic valve regurgitation is not visualized. No aortic stenosis is present. 5. The inferior vena cava is normal in size with greater than 50% respiratory variability, suggesting right atrial pressure of 3 mmHg. 6. Aortic dilatation noted. There is borderline dilatation at the level of the sinuses of Valsalva, measuring 37 mm. 7. Left atrial size was mildly dilated.  Echo 09/12/2020: IMPRESSIONS  1. Left ventricular ejection fraction, by estimation, is 60 to 65%. The  left ventricle has normal function. The left ventricle has no regional  wall motion abnormalities. Indeterminant diastolic function due to atrial  flutter.   2. Right ventricular systolic function is normal. The right ventricular  size is normal. There is normal pulmonary artery systolic pressure.   3. The mitral valve is normal in structure. Trivial mitral valve  regurgitation. No evidence of mitral stenosis.   4. The aortic valve is normal in structure. Aortic valve regurgitation is  not visualized. No aortic stenosis is present.   5. The inferior vena cava is normal in size with greater than 50%  respiratory variability, suggesting right atrial pressure of 3 mmHg.   6. Aortic dilatation noted. There is borderline dilatation at the level  of the sinuses of Valsalva, measuring 37 mm.   7. Left atrial size was mildly dilated.    Myoview Scan 07-30-2019: Study Highlights   Nuclear stress EF: 60%. The left ventricular ejection fraction is normal (55-65%). There was no ST  segment deviation noted during stress. This is a low risk study. No evidence of ischemia and no evidence of infarction The study is normal.    ASSESSMENT AND PLAN:  CAD: s/p PCI of the LAD 2019. Denies anginal symptoms. Continmues to exercise regularly. Continue ASA, Zetia. Intolerant to statin therapies. See below   HLD: Last LDL noted to be 104 from 05/04/21. Briefly stopped Zetia after seeing a transitional medicine doctor however restarted on his own. Obtain Lipid panel this week.   Atrial fibrillation s/p AF ablation: Seen after ablation with Dr. Caryl Comes 10/2020>>AC and beta blocker stopped at that time   Current medicines are reviewed at length with the patient today.  The patient does not have concerns regarding medicines.  The following changes have been made:  no change  Labs/ tests ordered today include: Lipid panel, CBC, BMET   Orders Placed This Encounter  Procedures   CBC   Basic metabolic panel   Lipid panel   EKG 12-Lead    Disposition:   FU with Ronnie Vazquez in 1 year  Signed, Kathyrn Drown, NP  01/25/2022 [address], [place], [place]  [address] Phone: [phone]; Fax: [phone]

## 2022-01-25 ENCOUNTER — Encounter: Payer: Self-pay | Admitting: Cardiology

## 2022-01-25 ENCOUNTER — Ambulatory Visit (INDEPENDENT_AMBULATORY_CARE_PROVIDER_SITE_OTHER): Payer: Medicare Other | Admitting: Cardiology

## 2022-01-25 ENCOUNTER — Other Ambulatory Visit: Payer: Self-pay

## 2022-01-25 VITALS — BP 110/80 | HR 68 | Ht 70.0 in | Wt 191.0 lb

## 2022-01-25 DIAGNOSIS — I483 Typical atrial flutter: Secondary | ICD-10-CM | POA: Diagnosis not present

## 2022-01-25 DIAGNOSIS — E782 Mixed hyperlipidemia: Secondary | ICD-10-CM

## 2022-01-25 DIAGNOSIS — I2510 Atherosclerotic heart disease of native coronary artery without angina pectoris: Secondary | ICD-10-CM

## 2022-01-25 NOTE — Patient Instructions (Signed)
Medication Instructions:  Your physician recommends that you continue on your current medications as directed. Please refer to the Current Medication list given to you today.  *If you need a refill on your cardiac medications before your next appointment, please call your pharmacy*   Lab Work: TO BE DONE IN 1 WEEK: FASTING LIPIDS, BMET, CBC If you have labs (blood work) drawn today and your tests are completely normal, you will receive your results only by: [place] (if you have MyChart) OR A paper copy in the mail If you have any lab test that is abnormal or we need to change your treatment, we will call you to review the results.   Testing/Procedures: NONE   Follow-Up: At [facility], you and your health needs are our priority.  As part of our continuing mission to provide you with exceptional heart care, we have created designated Provider Care Teams.  These Care Teams include your primary Cardiologist (physician) and Advanced Practice Providers (APPs -  Physician Assistants and Nurse Practitioners) who all work together to provide you with the care you need, when you need it.  We recommend signing up for the patient portal called "MyChart".  Sign up information is provided on this After Visit Summary.  MyChart is used to connect with patients for Virtual Visits (Telemedicine).  Patients are able to view lab/test results, encounter notes, upcoming appointments, etc.  Non-urgent messages can be sent to your provider as well.   To learn more about what you can do with MyChart, go to [email or web address].    Your next appointment:   1 year(s)  The format for your next appointment:   In Person  Provider:   Sherren Mocha, MD

## 2022-01-26 DIAGNOSIS — M48061 Spinal stenosis, lumbar region without neurogenic claudication: Secondary | ICD-10-CM | POA: Diagnosis not present

## 2022-01-28 ENCOUNTER — Telehealth: Payer: Self-pay | Admitting: Cardiology

## 2022-01-28 ENCOUNTER — Other Ambulatory Visit: Payer: Self-pay | Admitting: Cardiology

## 2022-01-28 DIAGNOSIS — N400 Enlarged prostate without lower urinary tract symptoms: Secondary | ICD-10-CM

## 2022-01-28 DIAGNOSIS — M48061 Spinal stenosis, lumbar region without neurogenic claudication: Secondary | ICD-10-CM | POA: Diagnosis not present

## 2022-01-28 NOTE — Progress Notes (Signed)
PSA ordered

## 2022-01-28 NOTE — Telephone Encounter (Signed)
Will send to Kathyrn Drown, NP about adding PSA test to scheduled labs next week. Patient verbalized understanding.

## 2022-01-28 NOTE — Telephone Encounter (Signed)
Patient is having labs done next week, he was wondering if a PSA test can be order as well as his urologist wants one ordered. This way he only has to be stuck once.

## 2022-02-01 ENCOUNTER — Other Ambulatory Visit: Payer: Self-pay

## 2022-02-01 ENCOUNTER — Other Ambulatory Visit: Payer: Medicare Other | Admitting: *Deleted

## 2022-02-01 DIAGNOSIS — N400 Enlarged prostate without lower urinary tract symptoms: Secondary | ICD-10-CM

## 2022-02-01 DIAGNOSIS — E782 Mixed hyperlipidemia: Secondary | ICD-10-CM

## 2022-02-01 DIAGNOSIS — I2510 Atherosclerotic heart disease of native coronary artery without angina pectoris: Secondary | ICD-10-CM | POA: Diagnosis not present

## 2022-02-01 LAB — CBC
Hematocrit: 42.8 % (ref 37.5–51.0)
Hemoglobin: 14.5 g/dL (ref 13.0–17.7)
MCH: 32.6 pg (ref 26.6–33.0)
MCHC: 33.9 g/dL (ref 31.5–35.7)
MCV: 96 fL (ref 79–97)
Platelets: 188 10*3/uL (ref 150–450)
RBC: 4.45 x10E6/uL (ref 4.14–5.80)
RDW: 12.5 % (ref 11.6–15.4)
WBC: 5.9 10*3/uL (ref 3.4–10.8)

## 2022-02-01 LAB — BASIC METABOLIC PANEL
BUN/Creatinine Ratio: 20 (ref 10–24)
BUN: 29 mg/dL — ABNORMAL HIGH (ref 8–27)
CO2: 25 mmol/L (ref 20–29)
Calcium: 9.4 mg/dL (ref 8.6–10.2)
Chloride: 104 mmol/L (ref 96–106)
Creatinine, Ser: 1.48 mg/dL — ABNORMAL HIGH (ref 0.76–1.27)
Glucose: 84 mg/dL (ref 70–99)
Potassium: 4.8 mmol/L (ref 3.5–5.2)
Sodium: 139 mmol/L (ref 134–144)
eGFR: 50 mL/min/{1.73_m2} — ABNORMAL LOW (ref >59)

## 2022-02-01 LAB — PSA: Prostate Specific Ag, Serum: 1.8 ng/mL (ref 0.0–4.0)

## 2022-02-01 LAB — LIPID PANEL
Chol/HDL Ratio: 4.6 ratio (ref 0.0–5.0)
Cholesterol, Total: 187 mg/dL (ref 100–199)
HDL: 41 mg/dL (ref >39)
LDL Chol Calc (NIH): 117 mg/dL — ABNORMAL HIGH (ref 0–99)
Triglycerides: 163 mg/dL — ABNORMAL HIGH (ref 0–149)
VLDL Cholesterol Cal: 29 mg/dL (ref 5–40)

## 2022-02-02 ENCOUNTER — Telehealth: Payer: Self-pay

## 2022-02-02 DIAGNOSIS — E782 Mixed hyperlipidemia: Secondary | ICD-10-CM

## 2022-02-02 DIAGNOSIS — M48061 Spinal stenosis, lumbar region without neurogenic claudication: Secondary | ICD-10-CM | POA: Diagnosis not present

## 2022-02-02 NOTE — Telephone Encounter (Signed)
-----   Message from Tommie Raymond, NP sent at 02/02/2022  4:24 PM EST ----- Please let the patient know that his cholesterol is a little higher than last year. His LDL is 117 and was 104 last year. I know we have discussed alternate therapies and if he would like to see the Lipid clinic, I am happy to place that referral for him. His creatinine is stable at his baseline.

## 2022-02-02 NOTE — Telephone Encounter (Signed)
Patient is agreeable to getting referral to lipid clinic. Will place order.

## 2022-02-04 DIAGNOSIS — M48061 Spinal stenosis, lumbar region without neurogenic claudication: Secondary | ICD-10-CM | POA: Diagnosis not present

## 2022-02-09 DIAGNOSIS — M48061 Spinal stenosis, lumbar region without neurogenic claudication: Secondary | ICD-10-CM | POA: Diagnosis not present

## 2022-02-12 DIAGNOSIS — M48061 Spinal stenosis, lumbar region without neurogenic claudication: Secondary | ICD-10-CM | POA: Diagnosis not present

## 2022-02-13 ENCOUNTER — Other Ambulatory Visit: Payer: Self-pay | Admitting: Gastroenterology

## 2022-02-16 DIAGNOSIS — M48061 Spinal stenosis, lumbar region without neurogenic claudication: Secondary | ICD-10-CM | POA: Diagnosis not present

## 2022-02-18 DIAGNOSIS — M48061 Spinal stenosis, lumbar region without neurogenic claudication: Secondary | ICD-10-CM | POA: Diagnosis not present

## 2022-02-23 ENCOUNTER — Other Ambulatory Visit: Payer: Self-pay

## 2022-02-23 ENCOUNTER — Ambulatory Visit (INDEPENDENT_AMBULATORY_CARE_PROVIDER_SITE_OTHER): Payer: Medicare Other | Admitting: Pharmacist

## 2022-02-23 DIAGNOSIS — E7800 Pure hypercholesterolemia, unspecified: Secondary | ICD-10-CM

## 2022-02-23 DIAGNOSIS — I2510 Atherosclerotic heart disease of native coronary artery without angina pectoris: Secondary | ICD-10-CM

## 2022-02-23 DIAGNOSIS — I25118 Atherosclerotic heart disease of native coronary artery with other forms of angina pectoris: Secondary | ICD-10-CM

## 2022-02-23 DIAGNOSIS — M5441 Lumbago with sciatica, right side: Secondary | ICD-10-CM | POA: Diagnosis not present

## 2022-02-23 NOTE — Progress Notes (Signed)
Patient ID: Ronnie Vazquez                 DOB: [birth year]                    MRN: [id number]     HPI: Ronnie Vazquez is a 76 y.o. male patient of Dr. Antionette Char referred to lipid clinic by Kathyrn Drown, NP. PMH is significant for CAD with history of LAD PCI in 2019, atrial flutter s/p ablation 10/24/20, GERD, and HLD. He has a known intolerance to statins as well as PCSK9 inhibitors and has therefore been maintained on Zetia. He did stop Zetia briefly after seeing a functional medicine doctor however restarted on his own after LDL went up.  Patient presents today to clinic. He shows me an advanced lipid panel drawn by the functional medicine doctor. One lab done on zetia the other off. He tells me that the functional medicine doctor told him he wasn't at risk of heart attack because his LDL size was small. He is no longer seeing this provider. He is scared to try injections because he is concerned the side effects will last a long time. Although the chart says he stopped Praluent because of cost, patient states money isn't an issue, so he must have had muscle pains with it.  Current Medications:  Intolerances: pravastatin 10mg  3x per week, nexletol, Repatha, rosuvastatin 5mg  MWF, rosuvastatin 5mg  twice a week, rosuvastatin 10mg  daily, atorvastatin 10mg  daily (muscle pains) Risk Factors: CAD LDL goal: <70  Diet:  Breakfast- cheerios, raisin brain mixed with nuts and seeds, berries or eggs  Lunch: ham sandwich or soup, rotisserie chicken Dinner: fish (tuna), chicken, pork loin, asparagus, green beans, salad (no dressing), potato (mashed or baked) Drink: water, coffee (2% milk)  Exercise: 30 min on treadmill, 15-20 cross trainer, weight machines- every other day. The other days he walks 1hr 15 min  Family History: The patient's family history includes Arthritis in his mother; Arthritis/Rheumatoid in his brother; Prostate cancer in his father.   Social History: The patient  reports that he has  never smoked. He has never used smokeless tobacco. He reports current alcohol use of about 1.0 standard drink per week. He reports that he does not use drugs.   Labs: 02/01/22 TC 187, TG 163, HDL 41, LDL-C 117 (zetia 10mg  daily) 08/11/21 LDL-P 1878, LDL-C 151, TG 176 (no medication) 05/04/21 LDL-P 1312, LDL-C114, TG 132 (zetia 10mg  daily)  Past Medical History:  Diagnosis Date   Arthritis    bilateral feet   Atrial flutter ([place])    Echo 9/21: EF 60-65, no RWMA, normal RVSF, trivial MR, borderline dilation of aorta at sinus of Valsalva (37 mm), mild LAE, RVSP 26.8   Chicken pox    hx   Coronary artery disease    08/29/18 PCI/DES with bifurcation from LAD into 1st diag.    GERD (gastroesophageal reflux disease)    with certain foods/on OTC meds   Hyperlipidemia    on meds   Left shoulder pain 02/02/2013   Microscopic hematuria 02/02/2013   Vision loss of left eye     Current Outpatient Medications on File Prior to Visit  Medication Sig Dispense Refill   aspirin EC 81 MG tablet Take 81 mg by mouth. Only takes occasionally Swallow whole.     clotrimazole (ANTIFUNGAL, CLOTRIMAZOLE,) 1 % cream Apply 1 application topically 2 (two) times daily. 30 g 2   ezetimibe (ZETIA) 10 MG tablet TAKE 1 TABLET  BY MOUTH EVERY DAY 90 tablet 2   hydrocortisone (ANUSOL-HC) 2.5 % rectal cream Place 1 application rectally 2 (two) times daily. 30 g 1   nitroGLYCERIN (NITROSTAT) 0.4 MG SL tablet Place 1 tablet (0.4 mg total) under the tongue every 5 (five) minutes as needed for chest pain. 25 tablet 6   pantoprazole (PROTONIX) 40 MG tablet TAKE 1 TABLET BY MOUTH DAILY 90 tablet 1   triamcinolone (KENALOG) 0.1 % Apply 1 application topically 2 (two) times daily. 30 g 0   No current facility-administered medications on file prior to visit.    Allergies  Allergen Reactions   Repatha [Evolocumab] Other (See Comments)    Myalgias   Bempedoic Acid Other (See Comments)    (Nexletol) muscle pain.   Crestor  [Rosuvastatin Calcium] Other (See Comments)    Myalgias (muscle/joint pain)   Statins     Other reaction(s): Other (See Comments), Other (See Comments) Myalgias (muscle/joint pain) Muscle aches Pt stated "goes to my muscles"     Assessment/Plan:  1. Hyperlipidemia - LDL-C is above goal of <70. We did discuss Leqvio, however patient is very hesitant due to its long dosing interval. He wishes to stick with zetia. We talked about the 5 modifiable risk factors for ASCVD. Discussed diet in detail. Warned against sugar- watch for added sugars and limit cereal. Patient pretty active with both aerobic exercise and weight training. Does not smoke and blood pressure is well controlled. Continue ezetimibe 10mg  daily. Patient would like to check labs again in 3 months. Advised he call office and we will schedule.   Thank you,   Ramond Dial, Pharm.D, BCPS, CPP [place]  [address], [place],  [address]  Phone: [phone]; Fax: [phone]

## 2022-02-23 NOTE — Patient Instructions (Signed)
Continue Zetia 10mg  daily Continue with you exercise

## 2022-03-01 ENCOUNTER — Ambulatory Visit (INDEPENDENT_AMBULATORY_CARE_PROVIDER_SITE_OTHER): Payer: Medicare Other | Admitting: Family Medicine

## 2022-03-01 ENCOUNTER — Encounter: Payer: Self-pay | Admitting: Family Medicine

## 2022-03-01 ENCOUNTER — Telehealth: Payer: Self-pay

## 2022-03-01 VITALS — BP 92/60 | HR 69 | Temp 97.8°F | Ht 70.0 in | Wt 188.3 lb

## 2022-03-01 DIAGNOSIS — Z789 Other specified health status: Secondary | ICD-10-CM

## 2022-03-01 DIAGNOSIS — M5441 Lumbago with sciatica, right side: Secondary | ICD-10-CM

## 2022-03-01 DIAGNOSIS — G8929 Other chronic pain: Secondary | ICD-10-CM | POA: Diagnosis not present

## 2022-03-01 DIAGNOSIS — F419 Anxiety disorder, unspecified: Secondary | ICD-10-CM

## 2022-03-01 MED ORDER — DIAZEPAM 5 MG PO TABS: ORAL_TABLET | ORAL | 0 refills | Status: DC

## 2022-03-01 NOTE — Telephone Encounter (Signed)
STENT RESOLUTE ONYX 3.0X30 ?Verified on [employer] website that the Onyx resolute stent is compatible with MRI. Patient made aware. ?

## 2022-03-01 NOTE — Progress Notes (Signed)
?Ronnie Vazquez ?DOB: 1946-05-27 ?Encounter date: 03/01/2022 ? ?This is a 76 y.o. male who presents with ?Chief Complaint  ?Patient presents with  ? Follow-up  ? ? ?History of present illness: ? ?Last visit with me was in December we discussed urinary frequency as well as ongoing swallowing difficulties. Saw urology and had exam. In the middle of eval starts getting numbness in leg from back. Talked with ortho neighbor, started doing some PT at home. When he did pelvic tilt at home, numbness would stop.PT helped with having to go to the bathroom. Has more control and less urgency. Has MRI tomorrow. He will follow up with neuro Dr. Ronnald Ramp - to review. Gets numbness more with standing up. Doesn't have it in bed.doesn't bother him once he lays down. Actually sleeping better in new bed. Taking tylenol and advil. Numbness is not painful, just numb. At first just felt like he had static electricity left lower leg. Then started to feel around knee, calf. Exercising regularly. Walks at least hour, hour and a half. Has gotten worse since it started.  ? ?Hyperlipidemia: Zetia 10 mg daily; has been statin intolerant ? ?Swallowing hasn't changed. Still feels like food getting stuck. Saw functional med (after seeing ENT, swallow specialists, etc). Functional med doc took him off of all meds - then recheck of cholesterol and lipids were very high, so he restarted his meds. Stopped the pantoprazole, felt like he felt food differently in his gut, but has restarted this.  ? ? ? ?Allergies  ?Allergen Reactions  ? Repatha [Evolocumab] Other (See Comments)  ?  Myalgias  ? Bempedoic Acid Other (See Comments)  ?  (Nexletol) muscle pain.  ? Crestor [Rosuvastatin Calcium] Other (See Comments)  ?  Myalgias (muscle/joint pain)  ? Statins   ?  Other reaction(s): Other (See Comments), Other (See Comments) ?Myalgias (muscle/joint pain) ?Muscle aches ?Pt stated "goes to my muscles" ?  ? ?Current Meds  ?Medication Sig  ? aspirin EC 81 MG tablet Take  81 mg by mouth. Only takes occasionally Swallow whole.  ? clotrimazole (ANTIFUNGAL, CLOTRIMAZOLE,) 1 % cream Apply 1 application topically 2 (two) times daily.  ? ezetimibe (ZETIA) 10 MG tablet TAKE 1 TABLET BY MOUTH EVERY DAY  ? hydrocortisone (ANUSOL-HC) 2.5 % rectal cream Place 1 application rectally 2 (two) times daily.  ? pantoprazole (PROTONIX) 40 MG tablet TAKE 1 TABLET BY MOUTH DAILY  ? triamcinolone (KENALOG) 0.1 % Apply 1 application topically 2 (two) times daily.  ? ? ?Review of Systems  ?Constitutional:  Negative for chills, fatigue and fever.  ?Respiratory:  Negative for cough, chest tightness, shortness of breath and wheezing.   ?Cardiovascular:  Negative for chest pain, palpitations and leg swelling.  ?Musculoskeletal:  Positive for back pain.  ? ?Objective: ? ?BP 92/60 (BP Location: Left Arm, Patient Position: Sitting, Cuff Size: Large)   Pulse 69   Temp 97.8 ?F (36.6 ?C) (Oral)   Ht '5\' 10"'$  (1.778 m)   Wt 188 lb 4.8 oz (85.4 kg)   SpO2 98%   BMI 27.02 kg/m?   Weight: 188 lb 4.8 oz (85.4 kg)  ? ?BP Readings from Last 3 Encounters:  ?03/01/22 92/60  ?01/25/22 110/80  ?11/30/21 98/68  ? ?Wt Readings from Last 3 Encounters:  ?03/01/22 188 lb 4.8 oz (85.4 kg)  ?01/25/22 191 lb (86.6 kg)  ?11/30/21 190 lb 4.8 oz (86.3 kg)  ? ? ?Physical Exam ?Constitutional:   ?   General: He is not in acute distress. ?  Appearance: He is well-developed.  ?Cardiovascular:  ?   Rate and Rhythm: Normal rate and regular rhythm.  ?   Heart sounds: Normal heart sounds. No murmur heard. ?  No friction rub.  ?Pulmonary:  ?   Effort: Pulmonary effort is normal. No respiratory distress.  ?   Breath sounds: Normal breath sounds. No wheezing or rales.  ?Musculoskeletal:  ?   Right lower leg: No edema.  ?   Left lower leg: No edema.  ?Neurological:  ?   Mental Status: He is alert and oriented to person, place, and time.  ?Psychiatric:     ?   Behavior: Behavior normal.  ? ? ?Assessment/Plan ? ?1. Statin intolerance ?He does ok  with the zetia. Continue with this. He follows with cardiology. ? ?2. Anxiety ?Small amount medication for anxiety during MRI.  ?- diazepam (VALIUM) 5 MG tablet; Take 1 tablet 1 hour prior to procedure, repeat dose in 30 minutes if needed.  Dispense: 4 tablet; Refill: 0 ? ?3. Chronic right-sided low back pain with right-sided sciatica ?Has MRI tomorrow. Will follow up with Dr. Ronnald Ramp.  ? ?Return in about 6 months (around 09/01/2022). ? ? ? ? ? ?Micheline Rough, MD ?

## 2022-03-01 NOTE — Telephone Encounter (Signed)
-----   Message from Nuala Alpha, LPN sent at 08/02/7671 11:47 AM EST ----- ?Regarding: FW: Wondering about stint (MRI tomorrow 03/07) ? ?----- Message ----- ?From: Judie Grieve ?Sent: 03/01/2022  11:44 AM EST ?To: [facility] Triage ?Subject: Wondering about stint (MRI tomorrow 03/07)    ? ?Patient is having MRI done and was wondering if his stint was metallic and going to interact with the MRI in anyway in anyway.  Confirmed his mobile number is the best way to reach him. ?Thank you! ? ? ?

## 2022-03-02 DIAGNOSIS — M545 Low back pain, unspecified: Secondary | ICD-10-CM | POA: Diagnosis not present

## 2022-03-04 DIAGNOSIS — M5441 Lumbago with sciatica, right side: Secondary | ICD-10-CM | POA: Diagnosis not present

## 2022-03-17 DIAGNOSIS — M48061 Spinal stenosis, lumbar region without neurogenic claudication: Secondary | ICD-10-CM | POA: Diagnosis not present

## 2022-03-19 DIAGNOSIS — M5416 Radiculopathy, lumbar region: Secondary | ICD-10-CM | POA: Diagnosis not present

## 2022-04-16 DIAGNOSIS — M5416 Radiculopathy, lumbar region: Secondary | ICD-10-CM | POA: Diagnosis not present

## 2022-04-19 ENCOUNTER — Ambulatory Visit (INDEPENDENT_AMBULATORY_CARE_PROVIDER_SITE_OTHER): Payer: Medicare Other

## 2022-04-19 VITALS — BP 122/60 | Temp 97.1°F | Ht 70.0 in | Wt 188.7 lb

## 2022-04-19 DIAGNOSIS — Z0000 Encounter for general adult medical examination without abnormal findings: Secondary | ICD-10-CM | POA: Diagnosis not present

## 2022-04-19 NOTE — Patient Instructions (Addendum)
?[name] , ?Thank you for taking time to come for your Medicare Wellness Visit. I appreciate your ongoing commitment to your health goals. Please review the following plan we discussed and let me know if I can assist you in the future.  ? ?These are the goals we discussed: ? Goals   ? ?   Exercise 3x per week (30 min per time)   ?   No goals at present (pt-stated)   ? ?  ?  ?This is a list of the screening recommended for you and due dates:  ?Health Maintenance  ?Topic Date Due  ? Tetanus Vaccine  06/01/2022*  ? Zoster (Shingles) Vaccine (1 of 2) 06/01/2022*  ? Colon Cancer Screening  06/18/2024  ? Pneumonia Vaccine  Completed  ? COVID-19 Vaccine  Completed  ? Hepatitis C Screening: USPSTF Recommendation to screen - Ages 53-79 yo.  Completed  ? HPV Vaccine  Aged Out  ? Flu Shot  Discontinued  ?*Topic was postponed. The date shown is not the original due date.  ? ?Advanced directives: Yes Patient will bring copy ? ?Conditions/risks identified: None ? ?Next appointment: Follow up in one year for your annual wellness visit.  ? ?Preventive Care 55 Years and Older, Male ?Preventive care refers to lifestyle choices and visits with your health care provider that can promote health and wellness. ?What does preventive care include? ?A yearly physical exam. This is also called an annual well check. ?Dental exams once or twice a year. ?Routine eye exams. Ask your health care provider how often you should have your eyes checked. ?Personal lifestyle choices, including: ?Daily care of your teeth and gums. ?Regular physical activity. ?Eating a healthy diet. ?Avoiding tobacco and drug use. ?Limiting alcohol use. ?Practicing safe sex. ?Taking low doses of aspirin every day. ?Taking vitamin and mineral supplements as recommended by your health care provider. ?What happens during an annual well check? ?The services and screenings done by your health care provider during your annual well check will depend on your age, overall  health, lifestyle risk factors, and family history of disease. ?Counseling  ?Your health care provider may ask you questions about your: ?Alcohol use. ?Tobacco use. ?Drug use. ?Emotional well-being. ?Home and relationship well-being. ?Sexual activity. ?Eating habits. ?History of falls. ?Memory and ability to understand (cognition). ?Work and work Statistician. ?Screening  ?You may have the following tests or measurements: ?Height, weight, and BMI. ?Blood pressure. ?Lipid and cholesterol levels. These may be checked every 5 years, or more frequently if you are over 86 years old. ?Skin check. ?Lung cancer screening. You may have this screening every year starting at age 49 if you have a 30-pack-year history of smoking and currently smoke or have quit within the past 15 years. ?Fecal occult blood test (FOBT) of the stool. You may have this test every year starting at age 53. ?Flexible sigmoidoscopy or colonoscopy. You may have a sigmoidoscopy every 5 years or a colonoscopy every 10 years starting at age 8. ?Prostate cancer screening. Recommendations will vary depending on your family history and other risks. ?Hepatitis C blood test. ?Hepatitis B blood test. ?Sexually transmitted disease (STD) testing. ?Diabetes screening. This is done by checking your blood sugar (glucose) after you have not eaten for a while (fasting). You may have this done every 1-3 years. ?Abdominal aortic aneurysm (AAA) screening. You may need this if you are a current or former smoker. ?Osteoporosis. You may be screened starting at age 74 if you are at high risk. ?  Talk with your health care provider about your test results, treatment options, and if necessary, the need for more tests. ?Vaccines  ?Your health care provider may recommend certain vaccines, such as: ?Influenza vaccine. This is recommended every year. ?Tetanus, diphtheria, and acellular pertussis (Tdap, Td) vaccine. You may need a Td booster every 10 years. ?Zoster vaccine. You may  need this after age 69. ?Pneumococcal 13-valent conjugate (PCV13) vaccine. One dose is recommended after age 11. ?Pneumococcal polysaccharide (PPSV23) vaccine. One dose is recommended after age 43. ?Talk to your health care provider about which screenings and vaccines you need and how often you need them. ?This information is not intended to replace advice given to you by your health care provider. Make sure you discuss any questions you have with your health care provider. ?Document Released: 01/09/2016 Document Revised: 09/01/2016 Document Reviewed: 10/14/2015 ?Elsevier Interactive Patient Education ? 2017 [place]. ? ?Fall Prevention in the Home ?Falls can cause injuries. They can happen to people of all ages. There are many things you can do to make your home safe and to help prevent falls. ?What can I do on the outside of my home? ?Regularly fix the edges of walkways and driveways and fix any cracks. ?Remove anything that might make you trip as you walk through a door, such as a raised step or threshold. ?Trim any bushes or trees on the path to your home. ?Use bright outdoor lighting. ?Clear any walking paths of anything that might make someone trip, such as rocks or tools. ?Regularly check to see if handrails are loose or broken. Make sure that both sides of any steps have handrails. ?Any raised decks and porches should have guardrails on the edges. ?Have any leaves, snow, or ice cleared regularly. ?Use sand or salt on walking paths during winter. ?Clean up any spills in your garage right away. This includes oil or grease spills. ?What can I do in the bathroom? ?Use night lights. ?Install grab bars by the toilet and in the tub and shower. Do not use towel bars as grab bars. ?Use non-skid mats or decals in the tub or shower. ?If you need to sit down in the shower, use a plastic, non-slip stool. ?Keep the floor dry. Clean up any water that spills on the floor as soon as it happens. ?Remove soap buildup in  the tub or shower regularly. ?Attach bath mats securely with double-sided non-slip rug tape. ?Do not have throw rugs and other things on the floor that can make you trip. ?What can I do in the bedroom? ?Use night lights. ?Make sure that you have a light by your bed that is easy to reach. ?Do not use any sheets or blankets that are too big for your bed. They should not hang down onto the floor. ?Have a firm chair that has side arms. You can use this for support while you get dressed. ?Do not have throw rugs and other things on the floor that can make you trip. ?What can I do in the kitchen? ?Clean up any spills right away. ?Avoid walking on wet floors. ?Keep items that you use a lot in easy-to-reach places. ?If you need to reach something above you, use a strong step stool that has a grab bar. ?Keep electrical cords out of the way. ?Do not use floor polish or wax that makes floors slippery. If you must use wax, use non-skid floor wax. ?Do not have throw rugs and other things on the floor that can make  you trip. ?What can I do with my stairs? ?Do not leave any items on the stairs. ?Make sure that there are handrails on both sides of the stairs and use them. Fix handrails that are broken or loose. Make sure that handrails are as long as the stairways. ?Check any carpeting to make sure that it is firmly attached to the stairs. Fix any carpet that is loose or worn. ?Avoid having throw rugs at the top or bottom of the stairs. If you do have throw rugs, attach them to the floor with carpet tape. ?Make sure that you have a light switch at the top of the stairs and the bottom of the stairs. If you do not have them, ask someone to add them for you. ?What else can I do to help prevent falls? ?Wear shoes that: ?Do not have high heels. ?Have rubber bottoms. ?Are comfortable and fit you well. ?Are closed at the toe. Do not wear sandals. ?If you use a stepladder: ?Make sure that it is fully opened. Do not climb a closed  stepladder. ?Make sure that both sides of the stepladder are locked into place. ?Ask someone to hold it for you, if possible. ?Clearly mark and make sure that you can see: ?Any grab bars or handrails. ?First and last st

## 2022-04-19 NOTE — Progress Notes (Signed)
? ?Subjective:  ? Ronnie Vazquez is a 76 y.o. male who presents for Medicare Annual/Subsequent preventive examination. ? ?Review of Systems    ? ? ?   ?Objective:  ?  ?Today's Vitals  ? 04/19/22 1040  ?BP: 122/60  ?Temp: (!) 97.1 ?F (36.2 ?C)  ?TempSrc: Oral  ?Weight: 188 lb 11.2 oz (85.6 kg)  ?Height: '5\' 10"'$  (1.778 m)  ? ?Body mass index is 27.08 kg/m?. ? ? ?  04/19/2022  ? 10:55 AM 04/16/2021  ? 12:52 PM 10/24/2020  ?  6:27 AM 10/06/2018  ? 12:12 AM 08/29/2018  ?  6:28 AM 08/10/2018  ?  8:59 AM 08/08/2018  ?  6:22 AM  ?Advanced Directives  ?Does Patient Have a Medical Advance Directive? Yes Yes Yes No Yes Yes Yes  ?Type of Paramedic of [place];Living will [place];Living will [place];Living will  [place];Living will Living will Living will  ?Does patient want to make changes to medical advance directive? No - Patient declined No - Patient declined No - Patient declined  No - Patient declined  No - Patient declined  ?Copy of [place] in Chart? No - copy requested No - copy requested No - copy requested  No - copy requested No - copy requested   ? ? ?Current Medications (verified) ?Outpatient Encounter Medications as of 04/19/2022  ?Medication Sig  ? aspirin EC 81 MG tablet Take 81 mg by mouth. Only takes occasionally Swallow whole.  ? clotrimazole (ANTIFUNGAL, CLOTRIMAZOLE,) 1 % cream Apply 1 application topically 2 (two) times daily.  ? diazepam (VALIUM) 5 MG tablet Take 1 tablet 1 hour prior to procedure, repeat dose in 30 minutes if needed.  ? ezetimibe (ZETIA) 10 MG tablet TAKE 1 TABLET BY MOUTH EVERY DAY  ? hydrocortisone (ANUSOL-HC) 2.5 % rectal cream Place 1 application rectally 2 (two) times daily.  ? nitroGLYCERIN (NITROSTAT) 0.4 MG SL tablet Place 1 tablet (0.4 mg total) under the tongue every 5 (five) minutes as needed for chest pain.  ? pantoprazole (PROTONIX) 40 MG tablet TAKE 1 TABLET BY MOUTH DAILY  ?  triamcinolone (KENALOG) 0.1 % Apply 1 application topically 2 (two) times daily.  ? ?No facility-administered encounter medications on file as of 04/19/2022.  ? ? ?Allergies (verified) ?Repatha [evolocumab], Bempedoic acid, Crestor [rosuvastatin calcium], and Statins  ? ?History: ?Past Medical History:  ?Diagnosis Date  ? Arthritis   ? bilateral feet  ? Atrial flutter ([place])   ? Echo 9/21: EF 60-65, no RWMA, normal RVSF, trivial MR, borderline dilation of aorta at sinus of Valsalva (37 mm), mild LAE, RVSP 26.8  ? Chicken pox   ? hx  ? Coronary artery disease   ? 08/29/18 PCI/DES with bifurcation from LAD into 1st diag.   ? GERD (gastroesophageal reflux disease)   ? with certain foods/on OTC meds  ? Hyperlipidemia   ? on meds  ? Left shoulder pain 02/02/2013  ? Microscopic hematuria 02/02/2013  ? Vision loss of left eye   ? ?Past Surgical History:  ?Procedure Laterality Date  ? A-FLUTTER ABLATION N/A 10/24/2020  ? Procedure: A-FLUTTER ABLATION;  Surgeon: Deboraha Sprang, MD;  Location: [facility];  Service: Cardiovascular;  Laterality: N/A;  ? COLONOSCOPY  10/2017  ? MS-suprep(good)-SSA/TICS/hems/TA's-3 yr recall  ? CORONARY BALLOON ANGIOPLASTY N/A 08/29/2018  ? Procedure: CORONARY BALLOON ANGIOPLASTY;  Surgeon: Sherren Mocha, MD;  Location: [facility];  Service:  Cardiovascular;  Laterality: N/A;  ? CORONARY STENT INTERVENTION N/A 08/29/2018  ? Procedure: CORONARY STENT INTERVENTION;  Surgeon: Sherren Mocha, MD;  Location: [facility];  Service: Cardiovascular;  Laterality: N/A;  ? ESOPHAGEAL MANOMETRY N/A 10/08/2020  ? Procedure: ESOPHAGEAL MANOMETRY (EM);  Surgeon: Ladene Artist, MD;  Location: [facility];  Service: Endoscopy;  Laterality: N/A;  ? LEFT HEART CATH AND CORONARY ANGIOGRAPHY N/A 08/08/2018  ? Procedure: LEFT HEART CATH AND CORONARY ANGIOGRAPHY;  Surgeon: Lorretta Harp, MD;  Location: [facility];  Service: Cardiovascular;  Laterality: N/A;  ? POLYPECTOMY  10/2017  ?  SSA/TA's  ? [place]  ? [place] EXTRACTION  1975  ? ?Family History  ?Problem Relation Age of Onset  ? Prostate cancer Father   ? Arthritis Mother   ? Arthritis/Rheumatoid Brother   ? Colon cancer Neg Hx   ? Esophageal cancer Neg Hx   ? Pancreatic cancer Neg Hx   ? Rectal cancer Neg Hx   ? Stomach cancer Neg Hx   ? Heart disease Neg Hx   ? Colon polyps Neg Hx   ? ?Social History  ? ?Socioeconomic History  ? Marital status: Married  ?  Spouse name: Not on file  ? Number of children: 2  ? Years of education: Not on file  ? Highest education level: Bachelor's degree (e.g., BA, AB, BS)  ?Occupational History  ? Occupation: Chief Financial Officer - retired  ?Tobacco Use  ? Smoking status: Never  ? Smokeless tobacco: Never  ?Vaping Use  ? Vaping Use: Never used  ?Substance and Sexual Activity  ? Alcohol use: Yes  ?  Alcohol/week: 1.0 standard drink  ?  Types: 1 Standard drinks or equivalent per week  ?  Comment: one glass or so a week  ? Drug use: No  ? Sexual activity: Not on file  ?Other Topics Concern  ? Not on file  ?Social History Narrative  ? 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   ? Home Situation: lives with wife  ?   ? Spiritual Beliefs: none  ?   ? Lifestyle: exercises 3 times per week  ?   ?   ?   ?   ? ?Social Determinants of Health  ? ?Financial Resource Strain: High Risk  ? Difficulty of Paying Living Expenses: Very hard  ?Food Insecurity: No Food Insecurity  ? Worried About Charity fundraiser in the Last Year: Never true  ? Ran Out of Food in the Last Year: Never true  ?Transportation Needs: No Transportation Needs  ? Lack of Transportation (Medical): No  ? Lack of Transportation (Non-Medical): No  ?Physical Activity: Sufficiently Active  ? Days of Exercise per Week: 7 days  ? Minutes of Exercise per Session: 60 min  ?Stress: No Stress Concern Present  ? Feeling of Stress : Not at all  ?Social Connections: Moderately Isolated  ? Frequency of Communication  with Friends and Family: More than three times a week  ? Frequency of Social Gatherings with Friends and Family: More than three times a week  ? Attends Religious Services: Never  ? Active Member of Clubs or Organizations: No  ? Attends Archivist Meetings: Never  ? Marital Status: Married  ? ? ? ?Clinical Intake: ? ? ?Diabetic? No ? ?Interpreter Needed?: No ? ?Activities of Daily Living ? ?  04/19/2022  ? 10:53 AM  ?In your present state  of health, do you have any difficulty performing the following activities:  ?Hearing? 0  ?Vision? 0  ?Difficulty concentrating or making decisions? 0  ?Walking or climbing stairs? 0  ?Dressing or bathing? 0  ?Doing errands, shopping? 0  ?Preparing Food and eating ? N  ?Using the Toilet? N  ?In the past six months, have you accidently leaked urine? N  ?Do you have problems with loss of bowel control? N  ?Managing your Medications? N  ?Managing your Finances? N  ?Housekeeping or managing your Housekeeping? N  ? ? ?Patient Care Team: ?Caren Macadam, MD as PCP - General (Family Medicine) ?Sherren Mocha, MD as PCP - Cardiology (Cardiology) ? ?Indicate any recent Medical Services you may have received from other than [facility] providers in the past year (date may be approximate). ? ?   ?Assessment:  ? This is a routine wellness examination for [place]. ? ?Hearing/Vision screen ?Hearing Screening - Comments:: Wears hearing aids ?Vision Screening - Comments:: Wears glasses. Followed by Dr Gilford Rile ? ?Dietary issues and exercise activities discussed: ?Exercise limited by: None identified ? ? Goals Addressed   ? ?  ?  ?  ?  ?  ? This Visit's Progress  ?   No goals at present (pt-stated)     ? ?  ? ?Depression Screen ? ?  04/19/2022  ? 10:51 AM 03/01/2022  ?  9:08 AM 11/30/2021  ? 12:47 PM 04/16/2021  ? 12:57 PM 04/16/2021  ? 12:49 PM 04/04/2020  ?  9:00 AM 02/02/2019  ?  9:04 AM  ?PHQ 2/9 Scores  ?PHQ - 2 Score 0 0 0 0 0 0 0  ?PHQ- 9 Score 0 0 0      ?  ?Fall Risk ? ?  04/19/2022  ? 10:54 AM  11/04/2021  ? 10:49 PM 04/16/2021  ? 12:57 PM 04/04/2020  ?  9:00 AM 11/21/2019  ?  9:43 AM  ?Fall Risk   ?Falls in the past year? 0 0 0 0 0  ?Comment     Emmi Telephone Survey: data to providers prior to load  ?Nu

## 2022-04-26 DIAGNOSIS — H00012 Hordeolum externum right lower eyelid: Secondary | ICD-10-CM | POA: Diagnosis not present

## 2022-04-26 DIAGNOSIS — H5211 Myopia, right eye: Secondary | ICD-10-CM | POA: Diagnosis not present

## 2022-04-26 DIAGNOSIS — H52221 Regular astigmatism, right eye: Secondary | ICD-10-CM | POA: Diagnosis not present

## 2022-04-30 DIAGNOSIS — M25562 Pain in left knee: Secondary | ICD-10-CM | POA: Diagnosis not present

## 2022-06-04 DIAGNOSIS — M5416 Radiculopathy, lumbar region: Secondary | ICD-10-CM | POA: Diagnosis not present

## 2022-07-02 DIAGNOSIS — H16141 Punctate keratitis, right eye: Secondary | ICD-10-CM | POA: Diagnosis not present

## 2022-07-02 DIAGNOSIS — H04203 Unspecified epiphora, bilateral lacrimal glands: Secondary | ICD-10-CM | POA: Diagnosis not present

## 2022-07-11 ENCOUNTER — Other Ambulatory Visit: Payer: Self-pay | Admitting: Cardiovascular Disease

## 2022-09-06 DIAGNOSIS — L821 Other seborrheic keratosis: Secondary | ICD-10-CM | POA: Diagnosis not present

## 2022-09-06 DIAGNOSIS — L814 Other melanin hyperpigmentation: Secondary | ICD-10-CM | POA: Diagnosis not present

## 2022-09-06 DIAGNOSIS — D225 Melanocytic nevi of trunk: Secondary | ICD-10-CM | POA: Diagnosis not present

## 2022-09-06 DIAGNOSIS — L570 Actinic keratosis: Secondary | ICD-10-CM | POA: Diagnosis not present

## 2022-11-03 DIAGNOSIS — Z23 Encounter for immunization: Secondary | ICD-10-CM | POA: Diagnosis not present

## 2022-11-09 ENCOUNTER — Ambulatory Visit (INDEPENDENT_AMBULATORY_CARE_PROVIDER_SITE_OTHER): Payer: Medicare Other | Admitting: Family Medicine

## 2022-11-09 ENCOUNTER — Encounter: Payer: Self-pay | Admitting: Family Medicine

## 2022-11-09 VITALS — BP 110/80 | HR 63 | Temp 97.6°F | Ht 70.0 in | Wt 189.2 lb

## 2022-11-09 DIAGNOSIS — E538 Deficiency of other specified B group vitamins: Secondary | ICD-10-CM | POA: Diagnosis not present

## 2022-11-09 DIAGNOSIS — K219 Gastro-esophageal reflux disease without esophagitis: Secondary | ICD-10-CM | POA: Diagnosis not present

## 2022-11-09 DIAGNOSIS — I25118 Atherosclerotic heart disease of native coronary artery with other forms of angina pectoris: Secondary | ICD-10-CM | POA: Diagnosis not present

## 2022-11-09 NOTE — Assessment & Plan Note (Addendum)
Patient is taking protonix 3 times a week. States that he does not have any acid reflux symptoms. I reviewed the results of his esophageal manometry. He will continue taking the protonix 3 times a week. Patient advised that if his symptoms worsened he should be re-evaluated.

## 2022-11-09 NOTE — Assessment & Plan Note (Signed)
Current chest pain or SOB, states that he would like to see if his plaques are getting worse. I advised that he should ask his cardiologist about further imaging for surveillance, especially given that he cannot tolerate statin medication. For now I advised that he continue the zetia daily as long as he tolerates the medication.

## 2022-11-09 NOTE — Progress Notes (Signed)
Established Patient Office Visit  Subjective   Patient ID: Ronnie Vazquez, male    DOB: October 06, 1946  Age: 76 y.o. MRN: [id number]  Chief Complaint  Patient presents with   Establish Care    Patient is here for transition of care visit.   CAD-- pt reports he had a blockage in his artery that was stented in 2019. States that he tried many different kinds of statin and the injectables which he could not tolerate. He is currently on zetia 10 mg daily for his cholesterol, see below.  States that since then he was having some ringing in the ears. Went to the audiologist and received hearing aids. States these are working for him.   States that he had an endoscopy for difficulty swallowing. States that he was told that his endoscopy was fine, no strictures. Was diagnosed with GERD- states that the protonix wasn't helping much, states they did a manometry test and was told that his esophagus was working at 50%. States that he went to [facility] and had a swallowing test.   Urinary frequency-- pt reports he has had increasing frequency and his PSA was 1.8 in February 2023. States that he wen to the urologist and he was given flomax which didn't help his symptoms. Getting up about once every night.   Patient is concerned about the zetia, states that he  was having right knee pain for a while. States that he was researching his medication and in the side effects joint pain is listed. Patient reports that he went off the zetia for about 14 days and his knee pain resolved. Patient states he started taking it again about 3 days ago to see if the knee pain will return. Patient is asking about the risk of staying off the zetia. States he has a follow up appointment in February 2024 with cardiology.    Current Outpatient Medications  Medication Instructions   aspirin EC 81 mg, Oral, Only takes occasionally Swallow whole.   clotrimazole (ANTIFUNGAL, CLOTRIMAZOLE,) 1 % cream 1 application , Topical, 2 times daily    ezetimibe (ZETIA) 10 mg, Oral, Daily   nitroGLYCERIN (NITROSTAT) 0.4 mg, Sublingual, Every 5 min PRN   pantoprazole (PROTONIX) 40 MG tablet TAKE 1 TABLET BY MOUTH DAILY    Patient Active Problem List   Diagnosis Date Noted   Hemorrhoids 06/04/2021   Gastritis without bleeding 06/04/2021   Esophageal dysphagia    Typical atrial flutter ([place]) 09/06/2020   B12 deficiency 11/14/2019   Gastroesophageal reflux disease 11/28/2018   Coronary artery disease with exertional angina ([place]) 08/15/2018   Carotid artery disease ([place]) 04/05/2016   TIA (transient ischemic attack) 12/01/2015   Hyperlipemia 02/02/2013   Microscopic hematuria, evaluated by urology 02/2013 02/02/2013      Review of Systems  All other systems reviewed and are negative.     Objective:     BP 110/80 (BP Location: Left Arm, Patient Position: Sitting, Cuff Size: Normal)   Pulse 63   Temp 97.6 F (36.4 C) (Oral)   Ht _0  (1.778 m)   Wt 189 lb 3.2 oz (85.8 kg)   SpO2 98%   BMI 27.15 kg/m    Physical Exam Vitals reviewed.  Constitutional:      Appearance: Normal appearance. He is well-groomed and normal weight.  Eyes:     Conjunctiva/sclera: Conjunctivae normal.  Cardiovascular:     Rate and Rhythm: Normal rate and regular rhythm.     Heart sounds: S1 normal and  S2 normal. No murmur heard. Pulmonary:     Effort: Pulmonary effort is normal.     Breath sounds: Normal breath sounds and air entry. No rales.  Abdominal:     General: Bowel sounds are normal.  Musculoskeletal:     Right lower leg: No edema.     Left lower leg: No edema.  Neurological:     General: No focal deficit present.     Mental Status: He is alert and oriented to person, place, and time.     Gait: Gait is intact.  Psychiatric:        Mood and Affect: Mood and affect normal.      No results found for any visits on 11/09/22.  Last metabolic panel Lab Results  Component Value Date   GLUCOSE 84 02/01/2022   NA 139 02/01/2022    K 4.8 02/01/2022   CL 104 02/01/2022   CO2 25 02/01/2022   BUN 29 (H) 02/01/2022   CREATININE 1.48 (H) 02/01/2022   EGFR 50 (L) 02/01/2022   CALCIUM 9.4 02/01/2022   PROT 6.4 05/04/2021   ALBUMIN 4.4 05/04/2021   LABGLOB 2.6 10/15/2019   AGRATIO 1.7 10/15/2019   BILITOT 0.4 05/04/2021   ALKPHOS 53 05/04/2021   AST 19 05/04/2021   ALT 18 05/04/2021   ANIONGAP 10 10/06/2018   Last lipids Lab Results  Component Value Date   CHOL 187 02/01/2022   HDL 41 02/01/2022   LDLCALC 117 (H) 02/01/2022   LDLDIRECT 117.0 08/12/2017   TRIG 163 (H) 02/01/2022   CHOLHDL 4.6 02/01/2022      The 10-year ASCVD risk score (Arnett DK, et al., 2019) is: 18.1%    Assessment & Plan:   Problem List Items Addressed This Visit       Cardiovascular and Mediastinum   Coronary artery disease with exertional angina ([place]) - Primary    Current chest pain or SOB, states that he would like to see if his plaques are getting worse. I advised that he should ask his cardiologist about further imaging for surveillance, especially given that he cannot tolerate statin medication. For now I advised that he continue the zetia daily as long as he tolerates the medication.         Digestive   Gastroesophageal reflux disease    Patient is taking protonix 3 times a week. States that he does not have any acid reflux symptoms. I reviewed the results of his esophageal manometry. He will continue taking the protonix 3 times a week. Patient advised that if his symptoms worsened he should be re-evaluated.        Other   B12 deficiency    History of in the past, he is taking 5000 mcg daily, I advised that he does need another B12 level, he wants to wait until he gets his labs done in February.       Return in about 1 year (around 11/10/2023) for Annual check up.    Farrel Conners, MD

## 2022-11-09 NOTE — Assessment & Plan Note (Signed)
History of in the past, he is taking 5000 mcg daily, I advised that he does need another B12 level, he wants to wait until he gets his labs done in February.

## 2022-11-09 NOTE — Patient Instructions (Signed)
Ask the cardiologist about ordering a CK with your annual labs to look for muscle breakdown.

## 2022-12-08 ENCOUNTER — Encounter: Payer: Self-pay | Admitting: Cardiovascular Disease

## 2022-12-08 ENCOUNTER — Ambulatory Visit: Payer: Medicare Other | Attending: Cardiovascular Disease | Admitting: Cardiovascular Disease

## 2022-12-08 VITALS — BP 134/86 | HR 67 | Ht 70.0 in | Wt 190.4 lb

## 2022-12-08 DIAGNOSIS — E782 Mixed hyperlipidemia: Secondary | ICD-10-CM | POA: Diagnosis not present

## 2022-12-08 DIAGNOSIS — I2510 Atherosclerotic heart disease of native coronary artery without angina pectoris: Secondary | ICD-10-CM | POA: Insufficient documentation

## 2022-12-08 NOTE — Patient Instructions (Signed)
Medication Instructions:  Your physician recommends that you continue on your current medications as directed. Please refer to the Current Medication list given to you today.  *If you need a refill on your cardiac medications before your next appointment, please call your pharmacy*  Lab Work: NONE If you have labs (blood work) drawn today and your tests are completely normal, you will receive your results only by: [place] (if you have MyChart) OR A paper copy in the mail If you have any lab test that is abnormal or we need to change your treatment, we will call you to review the results.   Testing/Procedures: Exercise Myoview Stress Test Your physician has requested that you have en exercise stress myoview. For further information please visit [email or web address]. Please follow instruction sheet, as given.   Follow-Up: At [facility], you and your health needs are our priority.  As part of our continuing mission to provide you with exceptional heart care, we have created designated Provider Care Teams.  These Care Teams include your primary Cardiologist (physician) and Advanced Practice Providers (APPs -  Physician Assistants and Nurse Practitioners) who all work together to provide you with the care you need, when you need it.  We recommend signing up for the patient portal called "MyChart".  Sign up information is provided on this After Visit Summary.  MyChart is used to connect with patients for Virtual Visits (Telemedicine).  Patients are able to view lab/test results, encounter notes, upcoming appointments, etc.  Non-urgent messages can be sent to your provider as well.   To learn more about what you can do with MyChart, go to [email or web address].    Your next appointment:   1 year(s)  The format for your next appointment:   In Person  Provider:   Sherren Mocha, MD      Other Instructions See instructions for stress test  Important Information About  [place]

## 2022-12-08 NOTE — Progress Notes (Signed)
Cardiology Office Note:    Date:  12/08/2022   ID:  [name], DOB 1946-09-30, MRN [id number]  PCP:  Farrel Conners, [place] [facility] Cardiologist:  Sherren Mocha, MD     Referring MD: Farrel Conners, MD   Chief Complaint  Patient presents with   Coronary Artery Disease    History of Present Illness:    NYCHOLAS Vazquez is a 76 y.o. male with a hx of coronary artery disease, presenting for follow-up evaluation.  The patient underwent PCI of the LAD/first diagonal bifurcation and 2019 when he underwent LAD stenting.  Since that time, he has been unable to tolerate any lipid-lowering therapies.  He has tried all statin and nonstatin drugs without success.  He has weakness and myalgias with lipid-lowering medications including Nexletol, PCSK9 inhibitors, and even the lowest doses of statin drugs.  He denies any chest pain, chest pressure, or shortness of breath at present.  He has had some problems with his knee that he attributes to the use of ezetimibe.  When he holds ezetimibe for 3 to 4 days his knee feels better.  He has not been exercising quite as much as he has in the past because of his knee issues.  Past Medical History:  Diagnosis Date   Arthritis    bilateral feet   Atrial flutter ([place])    Echo 9/21: EF 60-65, no RWMA, normal RVSF, trivial MR, borderline dilation of aorta at sinus of Valsalva (37 mm), mild LAE, RVSP 26.8   Chicken pox    hx   Coronary artery disease    08/29/18 PCI/DES with bifurcation from LAD into 1st diag.    GERD (gastroesophageal reflux disease)    with certain foods/on OTC meds   Hyperlipidemia    on meds   Left shoulder pain 02/02/2013   Microscopic hematuria 02/02/2013   Vision loss of left eye     Past Surgical History:  Procedure Laterality Date   A-FLUTTER ABLATION N/A 10/24/2020   Procedure: A-FLUTTER ABLATION;  Surgeon: Deboraha Sprang, MD;  Location: [facility];  Service: Cardiovascular;  Laterality:  N/A;   COLONOSCOPY  10/2017   MS-suprep(good)-SSA/TICS/hems/TA's-3 yr recall   CORONARY BALLOON ANGIOPLASTY N/A 08/29/2018   Procedure: CORONARY BALLOON ANGIOPLASTY;  Surgeon: Sherren Mocha, MD;  Location: [facility];  Service: Cardiovascular;  Laterality: N/A;   CORONARY STENT INTERVENTION N/A 08/29/2018   Procedure: CORONARY STENT INTERVENTION;  Surgeon: Sherren Mocha, MD;  Location: [facility];  Service: Cardiovascular;  Laterality: N/A;   ESOPHAGEAL MANOMETRY N/A 10/08/2020   Procedure: ESOPHAGEAL MANOMETRY (EM);  Surgeon: Ladene Artist, MD;  Location: [facility];  Service: Endoscopy;  Laterality: N/A;   LEFT HEART CATH AND CORONARY ANGIOGRAPHY N/A 08/08/2018   Procedure: LEFT HEART CATH AND CORONARY ANGIOGRAPHY;  Surgeon: Lorretta Harp, MD;  Location: [facility];  Service: Cardiovascular;  Laterality: N/A;   POLYPECTOMY  10/2017   SSA/TA's   TONSILLECTOMY AND ADENOIDECTOMY  1959   WISDOM TOOTH EXTRACTION  1975    Current Medications: Current Meds  Medication Sig   aspirin EC 81 MG tablet Take 81 mg by mouth. Only takes occasionally Swallow whole.   clotrimazole (ANTIFUNGAL, CLOTRIMAZOLE,) 1 % cream Apply 1 application topically 2 (two) times daily.   ezetimibe (ZETIA) 10 MG tablet TAKE ONE TABLET BY MOUTH DAILY   pantoprazole (PROTONIX) 40 MG tablet TAKE 1 TABLET BY MOUTH DAILY  Allergies:   Repatha [evolocumab], Bempedoic acid, Crestor [rosuvastatin calcium], and Statins   Social History   Socioeconomic History   Marital status: Married    Spouse name: Not on file   Number of children: 2   Years of education: Not on file   Highest education level: Bachelor's degree (e.g., BA, AB, BS)  Occupational History   Occupation: Chief Financial Officer - retired  Tobacco Use   Smoking status: Never   Smokeless tobacco: Never  Vaping Use   Vaping Use: Never used  Substance and Sexual Activity   Alcohol use: Yes    Alcohol/week: 1.0 standard drink of alcohol     Types: 1 Standard drinks or equivalent per week    Comment: one glass or so a week   Drug use: No   Sexual activity: Not on file  Other Topics Concern   Not on file  Social History Narrative   Work or School: Research scientist (life sciences), 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      Home Situation: lives with wife      Spiritual Beliefs: none      Lifestyle: exercises 3 times per week               Social Determinants of Health   Financial Resource Strain: High Risk (04/19/2022)   Overall Financial Resource Strain (CARDIA)    Difficulty of Paying Living Expenses: Very hard  Food Insecurity: No Food Insecurity (04/19/2022)   Hunger Vital Sign    Worried About Running Out of Food in the Last Year: Never true    Ran Out of Food in the Last Year: Never true  Transportation Needs: No Transportation Needs (04/19/2022)   PRAPARE - Hydrologist (Medical): No    Lack of Transportation (Non-Medical): No  Physical Activity: Sufficiently Active (04/19/2022)   Exercise Vital Sign    Days of Exercise per Week: 7 days    Minutes of Exercise per Session: 60 min  Stress: No Stress Concern Present (04/19/2022)   [place]    Feeling of Stress : Not at all  Social Connections: Moderately Isolated (04/19/2022)   Social Connection and Isolation Panel [NHANES]    Frequency of Communication with Friends and Family: More than three times a week    Frequency of Social Gatherings with Friends and Family: More than three times a week    Attends Religious Services: Never    Marine scientist or Organizations: No    Attends Music therapist: Never    Marital Status: Married     Family History: The patient's family history includes Arthritis in his mother; Arthritis/Rheumatoid in his brother; Prostate cancer in his father. There is no history of Colon cancer, Esophageal cancer, Pancreatic cancer, Rectal  cancer, Stomach cancer, Heart disease, or Colon polyps.  ROS:   Please see the history of present illness.    All other systems reviewed and are negative.  EKGs/Labs/Other Studies Reviewed:    EKG:  EKG is ordered today.  The ekg ordered today demonstrates sinus bradycardia 58 bpm, minimal voltage criteria for LVH may be normal variant.  Recent Labs: 02/01/2022: BUN 29; Creatinine, Ser 1.48; Hemoglobin 14.5; Platelets 188; Potassium 4.8; Sodium 139  Recent Lipid Panel    Component Value Date/Time   CHOL 187 02/01/2022 0950   TRIG 163 (H) 02/01/2022 0950   HDL 41 02/01/2022 0950   CHOLHDL 4.6 02/01/2022 0950   CHOLHDL 4.2  10/13/2020 0854   VLDL 29.0 06/15/2019 0809   LDLCALC 117 (H) 02/01/2022 0950   LDLCALC 106 (H) 10/13/2020 0854   LDLDIRECT 117.0 08/12/2017 0921     Risk Assessment/Calculations:                Physical Exam:    VS:  BP 134/86 (BP Location: Right Arm, Patient Position: Sitting, Cuff Size: Normal)   Pulse 67   Ht '5\' 10"'$  (1.778 m)   Wt 190 lb 6.4 oz (86.4 kg)   SpO2 96%   BMI 27.32 kg/m     Wt Readings from Last 3 Encounters:  12/08/22 190 lb 6.4 oz (86.4 kg)  11/09/22 189 lb 3.2 oz (85.8 kg)  04/19/22 188 lb 11.2 oz (85.6 kg)     GEN:  Well nourished, well developed in no acute distress HEENT: Normal NECK: No JVD; No carotid bruits LYMPHATICS: No lymphadenopathy CARDIAC: RRR, no murmurs, rubs, gallops RESPIRATORY:  Clear to auscultation without rales, wheezing or rhonchi  ABDOMEN: Soft, non-tender, non-distended MUSCULOSKELETAL:  No edema; No deformity  SKIN: Warm and dry NEUROLOGIC:  Alert and oriented x 3 PSYCHIATRIC:  Normal affect   ASSESSMENT:    1. Coronary artery disease involving native coronary artery of native heart without angina pectoris   2. Mixed hyperlipidemia    PLAN:    In order of problems listed above:  The patient appears clinically stable.  Unfortunately he is not able to tolerate any lipid-lowering therapy.   His only cardiac medication at present is aspirin 81 mg daily and Zetia when he is able to take it.  He is at higher than normal risk of recurrent ischemic events due to previous PCI of the LAD/first diagonal bifurcation.  Nuclear stress testing has been accurate at diagnosing myocardial ischemia as he had a high risk study prior to his PCI procedure and then had a normal Myoview study following the procedure.  It has been 4 years since his last nuclear scan and I have recommended a repeat study to evaluate for inducible ischemia.  He otherwise is doing quite well at this time. Reviewed potential treatment options which are quite limited at this point.  He has failed multiple classes of lipid-lowering therapies.  I discussed inclisiran with him but he is not interested in pursuing this.  Will follow-up after his stress test.           Medication Adjustments/Labs and Tests Ordered: Current medicines are reviewed at length with the patient today.  Concerns regarding medicines are outlined above.  Orders Placed This Encounter  Procedures   MYOCARDIAL PERFUSION IMAGING   EKG 12-Lead   No orders of the defined types were placed in this encounter.   Patient Instructions  Medication Instructions:  Your physician recommends that you continue on your current medications as directed. Please refer to the Current Medication list given to you today.  *If you need a refill on your cardiac medications before your next appointment, please call your pharmacy*  Lab Work: NONE If you have labs (blood work) drawn today and your tests are completely normal, you will receive your results only by: [place] (if you have MyChart) OR A paper copy in the mail If you have any lab test that is abnormal or we need to change your treatment, we will call you to review the results.   Testing/Procedures: Exercise Myoview Stress Test Your physician has requested that you have en exercise stress myoview. For  further information please visit  [email or web address]. Please follow instruction sheet, as given.   Follow-Up: At [facility], you and your health needs are our priority.  As part of our continuing mission to provide you with exceptional heart care, we have created designated Provider Care Teams.  These Care Teams include your primary Cardiologist (physician) and Advanced Practice Providers (APPs -  Physician Assistants and Nurse Practitioners) who all work together to provide you with the care you need, when you need it.  We recommend signing up for the patient portal called "MyChart".  Sign up information is provided on this After Visit Summary.  MyChart is used to connect with patients for Virtual Visits (Telemedicine).  Patients are able to view lab/test results, encounter notes, upcoming appointments, etc.  Non-urgent messages can be sent to your provider as well.   To learn more about what you can do with MyChart, go to [email or web address].    Your next appointment:   1 year(s)  The format for your next appointment:   In Person  Provider:   Sherren Mocha, MD      Other Instructions See instructions for stress test  Important Information About Sugar         Signed, Sherren Mocha, MD  12/08/2022 1:42 PM    [place]

## 2022-12-09 ENCOUNTER — Telehealth (HOSPITAL_COMMUNITY): Payer: Self-pay | Admitting: *Deleted

## 2022-12-09 NOTE — Telephone Encounter (Signed)
Patient given detailed instructions per Myocardial Perfusion Study Information Sheet for the test on 12/13/2022 at 7:45. Patient notified to arrive 15 minutes early and that it is imperative to arrive on time for appointment to keep from having the test rescheduled.  If you need to cancel or reschedule your appointment, please call the office within 24 hours of your appointment. . Patient verbalized understanding.Ronnie Vazquez

## 2022-12-13 ENCOUNTER — Ambulatory Visit (HOSPITAL_COMMUNITY): Payer: Medicare Other | Attending: Cardiovascular Disease

## 2022-12-13 DIAGNOSIS — I2510 Atherosclerotic heart disease of native coronary artery without angina pectoris: Secondary | ICD-10-CM

## 2022-12-13 LAB — MYOCARDIAL PERFUSION IMAGING
Angina Index: 0
Duke Treadmill Score: -2
Estimated workload: 9.6
Exercise duration (min): 8 min
Exercise duration (sec): 0 s
LV dias vol: 61 mL (ref 62–150)
LV sys vol: 24 mL
MPHR: 146 {beats}/min
Nuc Stress EF: 61 %
Peak HR: 125 {beats}/min
Percent HR: 85 %
Rest HR: 64 {beats}/min
Rest Nuclear Isotope Dose: 11 mCi
SDS: 2
SRS: 0
SSS: 2
ST Depression (mm): 2 mm
Stress Nuclear Isotope Dose: 32.8 mCi
TID: 0.9

## 2022-12-13 MED ORDER — TECHNETIUM TC 99M TETROFOSMIN IV KIT
11.0000 | PACK | Freq: Once | INTRAVENOUS | Status: AC | PRN
  Administered 2022-12-13: 11 via INTRAVENOUS

## 2022-12-13 MED ORDER — TECHNETIUM TC 99M TETROFOSMIN IV KIT
32.8000 | PACK | Freq: Once | INTRAVENOUS | Status: AC | PRN
  Administered 2022-12-13: 32.8 via INTRAVENOUS

## 2022-12-13 MED ORDER — REGADENOSON 0.4 MG/5ML IV SOLN: 0.4000 mg | Freq: Once | INTRAVENOUS | Status: DC

## 2023-01-05 DIAGNOSIS — M7061 Trochanteric bursitis, right hip: Secondary | ICD-10-CM | POA: Diagnosis not present

## 2023-01-10 DIAGNOSIS — M7061 Trochanteric bursitis, right hip: Secondary | ICD-10-CM | POA: Diagnosis not present

## 2023-01-14 DIAGNOSIS — M7061 Trochanteric bursitis, right hip: Secondary | ICD-10-CM | POA: Diagnosis not present

## 2023-01-17 DIAGNOSIS — M7061 Trochanteric bursitis, right hip: Secondary | ICD-10-CM | POA: Diagnosis not present

## 2023-01-19 DIAGNOSIS — M7061 Trochanteric bursitis, right hip: Secondary | ICD-10-CM | POA: Diagnosis not present

## 2023-01-25 DIAGNOSIS — M25551 Pain in right hip: Secondary | ICD-10-CM | POA: Diagnosis not present

## 2023-01-27 DIAGNOSIS — M25551 Pain in right hip: Secondary | ICD-10-CM | POA: Diagnosis not present

## 2023-02-04 DIAGNOSIS — M7061 Trochanteric bursitis, right hip: Secondary | ICD-10-CM | POA: Diagnosis not present

## 2023-02-04 DIAGNOSIS — M5416 Radiculopathy, lumbar region: Secondary | ICD-10-CM | POA: Diagnosis not present

## 2023-02-10 ENCOUNTER — Encounter: Payer: Self-pay | Admitting: Family Medicine

## 2023-02-10 ENCOUNTER — Ambulatory Visit (INDEPENDENT_AMBULATORY_CARE_PROVIDER_SITE_OTHER): Payer: Medicare Other | Admitting: Family Medicine

## 2023-02-10 VITALS — BP 102/70 | HR 75 | Temp 97.7°F | Ht 70.0 in | Wt 188.2 lb

## 2023-02-10 DIAGNOSIS — M25551 Pain in right hip: Secondary | ICD-10-CM | POA: Diagnosis not present

## 2023-02-10 MED ORDER — METHOCARBAMOL 500 MG PO TABS: 500.0000 mg | ORAL_TABLET | Freq: Four times a day (QID) | ORAL | 0 refills | Status: DC | PRN

## 2023-02-10 NOTE — Patient Instructions (Addendum)
Dry needling might help with the physical therapist -- ask them if that is something they can do for you.

## 2023-02-10 NOTE — Progress Notes (Signed)
   Acute Office Visit  Subjective:     Patient ID: Ronnie Vazquez, male    DOB: 09-Jan-1946, 77 y.o.   MRN: [id number]  Chief Complaint  Patient presents with   Fall    Patient complains of recurrent right femoral/hip pain as he states he was in the kitchen at home, standing with one leg on a chair, turned and fell onto the right hip, seen by orthopedist Dr Rhona Raider, no fracture noted and later MRI ordered 2 months ago and scheduled for an injection next week    Fall   Patient is in today for recurrent hip pain after he sustained a 3 foot fall. States that he landed on the lateral side of his right hip and has had pain ever since. States he has been engaging in PT, stretching, icing twice a day. I reviewed his MRI results of the hip with patient and we discussed the anatomy of the region where he is having pain. States that the pain is present when he walks more than 100 feet. States when he is biking he doesn't get the same pain.   Review of Systems  All other systems reviewed and are negative.       Objective:    BP 102/70 (BP Location: Left Arm, Patient Position: Sitting, Cuff Size: Normal)   Pulse 75   Temp 97.7 F (36.5 C) (Oral)   Ht '5\' 10"'$  (1.778 m)   Wt 188 lb 3.2 oz (85.4 kg)   SpO2 100%   BMI 27.00 kg/m    Physical Exam Vitals reviewed.  Constitutional:      Appearance: Normal appearance. He is normal weight.  Eyes:     Conjunctiva/sclera: Conjunctivae normal.  Cardiovascular:     Pulses: Normal pulses.  Musculoskeletal:        General: Tenderness (lateral right hip just posterior to the greater trochanter) present. No swelling or deformity. Normal range of motion.     Right lower leg: No edema.     Left lower leg: No edema.  Neurological:     Mental Status: He is alert.     No results found for any visits on 02/10/23.      Assessment & Plan:   Problem List Items Addressed This Visit   None Visit Diagnoses     Pain in right hip    -  Primary    Relevant Medications   methocarbamol (ROBAXIN) 500 MG tablet     Patient's pain is most likely muscular, as it only hurts when he is using that specific muscle. I reviewed his MRI hip with him, he currently has NO evidence of radiculopathy symptoms. He is going next week to the orthopedist/sport medicine specialist to discuss getting a second injection. Pt reports he was beginning to get improvement with the oral steroids they placed him on, but thought maybe it wasn't long enough course. I recommended trying a muscle relaxer to see if this would help. Pt is agreeable to try it. RTC PRN  Meds ordered this encounter  Medications   methocarbamol (ROBAXIN) 500 MG tablet    Sig: Take 1 tablet (500 mg total) by mouth every 6 (six) hours as needed for muscle spasms.    Dispense:  30 tablet    Refill:  0    No follow-ups on file.  Farrel Conners, MD

## 2023-02-11 ENCOUNTER — Telehealth: Payer: Self-pay | Admitting: Family Medicine

## 2023-02-11 DIAGNOSIS — M25551 Pain in right hip: Secondary | ICD-10-CM

## 2023-02-11 MED ORDER — PREDNISONE 20 MG PO TABS: ORAL_TABLET | ORAL | 0 refills | Status: AC

## 2023-02-11 NOTE — Telephone Encounter (Signed)
Pt was seen on 02/10/23.  Pt stated that MD told him she would send him an anti-inflammatory, if needed.   Pt states he would like this sent to:  [facility] [id number] - [place], [place] Phone: [phone]  Fax: [phone]

## 2023-02-11 NOTE — Telephone Encounter (Signed)
Patient dropped off document to be filled out by provider Handicap Placard. Patient requested to send it via Call Patient to pick up within 5-days. Document is located in providers tray at front office.   Please advise.

## 2023-02-14 DIAGNOSIS — M5441 Lumbago with sciatica, right side: Secondary | ICD-10-CM | POA: Diagnosis not present

## 2023-02-14 NOTE — Telephone Encounter (Signed)
Left a detailed message at the patient's cell number stating the placard application was left at the front desk for pick up.

## 2023-02-15 DIAGNOSIS — M7061 Trochanteric bursitis, right hip: Secondary | ICD-10-CM | POA: Diagnosis not present

## 2023-02-15 DIAGNOSIS — M5416 Radiculopathy, lumbar region: Secondary | ICD-10-CM | POA: Diagnosis not present

## 2023-02-18 DIAGNOSIS — M7061 Trochanteric bursitis, right hip: Secondary | ICD-10-CM | POA: Diagnosis not present

## 2023-02-18 DIAGNOSIS — M5416 Radiculopathy, lumbar region: Secondary | ICD-10-CM | POA: Diagnosis not present

## 2023-02-22 DIAGNOSIS — M5416 Radiculopathy, lumbar region: Secondary | ICD-10-CM | POA: Diagnosis not present

## 2023-02-22 DIAGNOSIS — M7061 Trochanteric bursitis, right hip: Secondary | ICD-10-CM | POA: Diagnosis not present

## 2023-03-01 DIAGNOSIS — M5416 Radiculopathy, lumbar region: Secondary | ICD-10-CM | POA: Diagnosis not present

## 2023-03-02 DIAGNOSIS — M5416 Radiculopathy, lumbar region: Secondary | ICD-10-CM | POA: Diagnosis not present

## 2023-03-21 DIAGNOSIS — M533 Sacrococcygeal disorders, not elsewhere classified: Secondary | ICD-10-CM | POA: Diagnosis not present

## 2023-03-22 DIAGNOSIS — M5416 Radiculopathy, lumbar region: Secondary | ICD-10-CM | POA: Diagnosis not present

## 2023-03-22 DIAGNOSIS — Z6827 Body mass index (BMI) 27.0-27.9, adult: Secondary | ICD-10-CM | POA: Diagnosis not present

## 2023-03-30 DIAGNOSIS — M533 Sacrococcygeal disorders, not elsewhere classified: Secondary | ICD-10-CM | POA: Diagnosis not present

## 2023-04-05 ENCOUNTER — Other Ambulatory Visit: Payer: Self-pay | Admitting: Student

## 2023-04-05 DIAGNOSIS — M5416 Radiculopathy, lumbar region: Secondary | ICD-10-CM

## 2023-04-12 ENCOUNTER — Telehealth: Payer: Self-pay | Admitting: Family Medicine

## 2023-04-12 ENCOUNTER — Ambulatory Visit
Admission: RE | Admit: 2023-04-12 | Discharge: 2023-04-12 | Disposition: A | Discharge status: Home / Self Care | Payer: Medicare Other | Source: Ambulatory Visit | Attending: Student | Admitting: Student

## 2023-04-12 DIAGNOSIS — M545 Low back pain, unspecified: Secondary | ICD-10-CM | POA: Diagnosis not present

## 2023-04-12 DIAGNOSIS — M47816 Spondylosis without myelopathy or radiculopathy, lumbar region: Secondary | ICD-10-CM | POA: Diagnosis not present

## 2023-04-12 DIAGNOSIS — M5136 Other intervertebral disc degeneration, lumbar region: Secondary | ICD-10-CM | POA: Diagnosis not present

## 2023-04-12 DIAGNOSIS — M48061 Spinal stenosis, lumbar region without neurogenic claudication: Secondary | ICD-10-CM | POA: Diagnosis not present

## 2023-04-12 DIAGNOSIS — M5416 Radiculopathy, lumbar region: Secondary | ICD-10-CM

## 2023-04-12 DIAGNOSIS — M4126 Other idiopathic scoliosis, lumbar region: Secondary | ICD-10-CM | POA: Diagnosis not present

## 2023-04-12 NOTE — Telephone Encounter (Signed)
Called patient to schedule Medicare Annual Wellness Visit (AWV). Left message for patient to call back and schedule Medicare Annual Wellness Visit (AWV).  Last date of AWV: 04/19/22  Due to schedule change 04/21/23 appt needs to r/s   If any questions, please contact me at [phone].  Thank you ,  Rudell Cobb AWV direct phone # [phone]

## 2023-04-13 ENCOUNTER — Telehealth: Payer: Self-pay | Admitting: Family Medicine

## 2023-04-13 NOTE — Telephone Encounter (Signed)
Contacted [facility] to schedule their annual wellness visit. Appointment made for 04/27/23.  Rudell Cobb AWV direct phone # [phone]   Spoke to spouse to r/s 4/25 to 5/1 due to schedule change  Spouse aware

## 2023-04-18 DIAGNOSIS — M5416 Radiculopathy, lumbar region: Secondary | ICD-10-CM | POA: Diagnosis not present

## 2023-04-25 ENCOUNTER — Telehealth: Payer: Self-pay | Admitting: Family Medicine

## 2023-04-25 NOTE — Telephone Encounter (Signed)
Contacted [facility] to schedule their annual wellness visit. Appointment made for 05/06/23.  Rudell Cobb AWV direct phone # [phone]  Due to schedule change 04/27/23 awv has been r/s to 5/10 Pt aware

## 2023-04-28 DIAGNOSIS — M5416 Radiculopathy, lumbar region: Secondary | ICD-10-CM | POA: Diagnosis not present

## 2023-05-03 ENCOUNTER — Other Ambulatory Visit: Payer: Self-pay | Admitting: Family Medicine

## 2023-05-03 DIAGNOSIS — M25551 Pain in right hip: Secondary | ICD-10-CM

## 2023-05-03 NOTE — Telephone Encounter (Signed)
Pt is going out of town in 2 hrs and he is aware refill  can take up to 3 business days

## 2023-05-06 ENCOUNTER — Ambulatory Visit (INDEPENDENT_AMBULATORY_CARE_PROVIDER_SITE_OTHER): Payer: Medicare Other

## 2023-05-06 VITALS — BP 110/60 | HR 82 | Temp 98.2°F | Ht 70.0 in | Wt 190.5 lb

## 2023-05-06 DIAGNOSIS — Z0000 Encounter for general adult medical examination without abnormal findings: Secondary | ICD-10-CM | POA: Diagnosis not present

## 2023-05-06 NOTE — Patient Instructions (Addendum)
Ronnie Vazquez , Thank you for taking time to come for your Medicare Wellness Visit. I appreciate your ongoing commitment to your health goals. Please review the following plan we discussed and let me know if I can assist you in the future.   These are the goals we discussed:  Goals       Exercise 3x per week (30 min per time)      No current goals (pt-stated)      No goals at present (pt-stated)        This is a list of the screening recommended for you and due dates:  Health Maintenance  Topic Date Due   DTaP/Tdap/Td vaccine (2 - Td or Tdap) 05/28/2019   COVID-19 Vaccine (7 - 2023-24 season) 05/22/2023*   Zoster (Shingles) Vaccine (1 of 2) 08/06/2023*   Medicare Annual Wellness Visit  05/05/2024   Colon Cancer Screening  06/18/2024   Pneumonia Vaccine  Completed   Hepatitis C Screening: USPSTF Recommendation to screen - Ages 18-79 yo.  Completed   HPV Vaccine  Aged Out   Flu Shot  Discontinued  *Topic was postponed. The date shown is not the original due date.    Advanced directives: Please bring a copy of your health care power of attorney and living will to the office to be added to your chart at your convenience.   Conditions/risks identified: None  Next appointment: Follow up in one year for your annual wellness visit.   Preventive Care 67 Years and Older, Male  Preventive care refers to lifestyle choices and visits with your health care provider that can promote health and wellness. What does preventive care include? A yearly physical exam. This is also called an annual well check. Dental exams once or twice a year. Routine eye exams. Ask your health care provider how often you should have your eyes checked. Personal lifestyle choices, including: Daily care of your teeth and gums. Regular physical activity. Eating a healthy diet. Avoiding tobacco and drug use. Limiting alcohol use. Practicing safe sex. Taking low doses of aspirin every day. Taking vitamin and  mineral supplements as recommended by your health care provider. What happens during an annual well check? The services and screenings done by your health care provider during your annual well check will depend on your age, overall health, lifestyle risk factors, and family history of disease. Counseling  Your health care provider may ask you questions about your: Alcohol use. Tobacco use. Drug use. Emotional well-being. Home and relationship well-being. Sexual activity. Eating habits. History of falls. Memory and ability to understand (cognition). Work and work Astronomer. Screening  You may have the following tests or measurements: Height, weight, and BMI. Blood pressure. Lipid and cholesterol levels. These may be checked every 5 years, or more frequently if you are over 62 years old. Skin check. Lung cancer screening. You may have this screening every year starting at age 57 if you have a 30-pack-year history of smoking and currently smoke or have quit within the past 15 years. Fecal occult blood test (FOBT) of the stool. You may have this test every year starting at age 32. Flexible sigmoidoscopy or colonoscopy. You may have a sigmoidoscopy every 5 years or a colonoscopy every 10 years starting at age 57. Prostate cancer screening. Recommendations will vary depending on your family history and other risks. Hepatitis C blood test. Hepatitis B blood test. Sexually transmitted disease (STD) testing. Diabetes screening. This is done by checking your blood sugar (glucose) after  you have not eaten for a while (fasting). You may have this done every 1-3 years. Abdominal aortic aneurysm (AAA) screening. You may need this if you are a current or former smoker. Osteoporosis. You may be screened starting at age 27 if you are at high risk. Talk with your health care provider about your test results, treatment options, and if necessary, the need for more tests. Vaccines  Your health care  provider may recommend certain vaccines, such as: Influenza vaccine. This is recommended every year. Tetanus, diphtheria, and acellular pertussis (Tdap, Td) vaccine. You may need a Td booster every 10 years. Zoster vaccine. You may need this after age 61. Pneumococcal 13-valent conjugate (PCV13) vaccine. One dose is recommended after age 87. Pneumococcal polysaccharide (PPSV23) vaccine. One dose is recommended after age 32. Talk to your health care provider about which screenings and vaccines you need and how often you need them. This information is not intended to replace advice given to you by your health care provider. Make sure you discuss any questions you have with your health care provider. Document Released: 01/09/2016 Document Revised: 09/01/2016 Document Reviewed: 10/14/2015 Elsevier Interactive Patient Education  2017 [place] Prevention in the Home Falls can cause injuries. They can happen to people of all ages. There are many things you can do to make your home safe and to help prevent falls. What can I do on the outside of my home? Regularly fix the edges of walkways and driveways and fix any cracks. Remove anything that might make you trip as you walk through a door, such as a raised step or threshold. Trim any bushes or trees on the path to your home. Use bright outdoor lighting. Clear any walking paths of anything that might make someone trip, such as rocks or tools. Regularly check to see if handrails are loose or broken. Make sure that both sides of any steps have handrails. Any raised decks and porches should have guardrails on the edges. Have any leaves, snow, or ice cleared regularly. Use sand or salt on walking paths during winter. Clean up any spills in your garage right away. This includes oil or grease spills. What can I do in the bathroom? Use night lights. Install grab bars by the toilet and in the tub and shower. Do not use towel bars as grab  bars. Use non-skid mats or decals in the tub or shower. If you need to sit down in the shower, use a plastic, non-slip stool. Keep the floor dry. Clean up any water that spills on the floor as soon as it happens. Remove soap buildup in the tub or shower regularly. Attach bath mats securely with double-sided non-slip rug tape. Do not have throw rugs and other things on the floor that can make you trip. What can I do in the bedroom? Use night lights. Make sure that you have a light by your bed that is easy to reach. Do not use any sheets or blankets that are too big for your bed. They should not hang down onto the floor. Have a firm chair that has side arms. You can use this for support while you get dressed. Do not have throw rugs and other things on the floor that can make you trip. What can I do in the kitchen? Clean up any spills right away. Avoid walking on wet floors. Keep items that you use a lot in easy-to-reach places. If you need to reach something above you, use a strong  step stool that has a grab bar. Keep electrical cords out of the way. Do not use floor polish or wax that makes floors slippery. If you must use wax, use non-skid floor wax. Do not have throw rugs and other things on the floor that can make you trip. What can I do with my stairs? Do not leave any items on the stairs. Make sure that there are handrails on both sides of the stairs and use them. Fix handrails that are broken or loose. Make sure that handrails are as long as the stairways. Check any carpeting to make sure that it is firmly attached to the stairs. Fix any carpet that is loose or worn. Avoid having throw rugs at the top or bottom of the stairs. If you do have throw rugs, attach them to the floor with carpet tape. Make sure that you have a light switch at the top of the stairs and the bottom of the stairs. If you do not have them, ask someone to add them for you. What else can I do to help prevent  falls? Wear shoes that: Do not have high heels. Have rubber bottoms. Are comfortable and fit you well. Are closed at the toe. Do not wear sandals. If you use a stepladder: Make sure that it is fully opened. Do not climb a closed stepladder. Make sure that both sides of the stepladder are locked into place. Ask someone to hold it for you, if possible. Clearly mark and make sure that you can see: Any grab bars or handrails. First and last steps. Where the edge of each step is. Use tools that help you move around (mobility aids) if they are needed. These include: Canes. Walkers. Scooters. Crutches. Turn on the lights when you go into a dark area. Replace any light bulbs as soon as they burn out. Set up your furniture so you have a clear path. Avoid moving your furniture around. If any of your floors are uneven, fix them. If there are any pets around you, be aware of where they are. Review your medicines with your doctor. Some medicines can make you feel dizzy. This can increase your chance of falling. Ask your doctor what other things that you can do to help prevent falls. This information is not intended to replace advice given to you by your health care provider. Make sure you discuss any questions you have with your health care provider. Document Released: 10/09/2009 Document Revised: 05/20/2016 Document Reviewed: 01/17/2015 Elsevier Interactive Patient Education  2017 [employer].

## 2023-05-06 NOTE — Progress Notes (Signed)
Subjective:   Ronnie Vazquez is a 77 y.o. male who presents for Medicare Annual/Subsequent preventive examination.  Review of Systems      Cardiac Risk Factors include: advanced age (>36men, >69 women)     Objective:    Today's Vitals   05/06/23 0956  BP: 110/60  Pulse: 82  Temp: 98.2 F (36.8 C)  TempSrc: Oral  SpO2: 97%  Weight: 190 lb 8 oz (86.4 kg)  Height: 5\' 10"  (1.778 m)   Body mass index is 27.33 kg/m.     05/06/2023   10:19 AM 04/19/2022   10:55 AM 04/16/2021   12:52 PM 10/24/2020    6:27 AM 10/06/2018   12:12 AM 08/29/2018    6:28 AM 08/10/2018    8:59 AM  Advanced Directives  Does Patient Have a Medical Advance Directive? Yes Yes Yes Yes No Yes Yes  Type of Estate agent of [place];Living will Healthcare Power of [place];Living will Healthcare Power of [place];Living will Healthcare Power of [place];Living will  Healthcare Power of [place];Living will Living will  Does patient want to make changes to medical advance directive?  No - Patient declined No - Patient declined No - Patient declined  No - Patient declined   Copy of Healthcare Power of Attorney in Chart? No - copy requested No - copy requested No - copy requested No - copy requested  No - copy requested No - copy requested    Current Medications (verified) Outpatient Encounter Medications as of 05/06/2023  Medication Sig   gabapentin (NEURONTIN) 300 MG capsule Take 300 mg by mouth daily.   aspirin EC 81 MG tablet Take 81 mg by mouth. Only takes occasionally Swallow whole.   clotrimazole (ANTIFUNGAL, CLOTRIMAZOLE,) 1 % cream Apply 1 application topically 2 (two) times daily.   ezetimibe (ZETIA) 10 MG tablet TAKE ONE TABLET BY MOUTH DAILY   methocarbamol (ROBAXIN) 500 MG tablet TAKE ONE TABLET BY MOUTH EVERY 6 HOURS AS NEEDED FOR MUSCLE SPASMS   nitroGLYCERIN (NITROSTAT) 0.4 MG SL tablet Place 1 tablet (0.4 mg total) under the tongue every 5 (five) minutes as needed for chest  pain.   pantoprazole (PROTONIX) 40 MG tablet TAKE 1 TABLET BY MOUTH DAILY   Facility-Administered Encounter Medications as of 05/06/2023  Medication   regadenoson (LEXISCAN) injection SOLN 0.4 mg    Allergies (verified) Repatha [evolocumab], Bempedoic acid, Crestor [rosuvastatin calcium], and Statins   History: Past Medical History:  Diagnosis Date   Arthritis    bilateral feet   Atrial flutter (HCC)    Echo 9/21: EF 60-65, no RWMA, normal RVSF, trivial MR, borderline dilation of aorta at sinus of Valsalva (37 mm), mild LAE, RVSP 26.8   Chicken pox    hx   Coronary artery disease    08/29/18 PCI/DES with bifurcation from LAD into 1st diag.    GERD (gastroesophageal reflux disease)    with certain foods/on OTC meds   Hyperlipidemia    on meds   Left shoulder pain 02/02/2013   Microscopic hematuria 02/02/2013   Vision loss of left eye    Past Surgical History:  Procedure Laterality Date   A-FLUTTER ABLATION N/A 10/24/2020   Procedure: A-FLUTTER ABLATION;  Surgeon: Duke Salvia, MD;  Location[facility] INVASIVE CV LAB;  Service: Cardiovascular;  Laterality: N/A;   COLONOSCOPY  10/2017   MS-suprep(good)-SSA/TICS/hems/TA's-3 yr recall   CORONARY BALLOON ANGIOPLASTY N/A 08/29/2018   Procedure: CORONARY BALLOON ANGIOPLASTY;  Surgeon: Tonny Bollman, MD;  Location: [facility] INVASIVE  CV LAB;  Service: Cardiovascular;  Laterality: N/A;   CORONARY STENT INTERVENTION N/A 08/29/2018   Procedure: CORONARY STENT INTERVENTION;  Surgeon: Tonny Bollman, MD;  Location: [facility] INVASIVE CV LAB;  Service: Cardiovascular;  Laterality: N/A;   ESOPHAGEAL MANOMETRY N/A 10/08/2020   Procedure: ESOPHAGEAL MANOMETRY (EM);  Surgeon: Meryl Dare, MD;  Location: [facility] ENDOSCOPY;  Service: Endoscopy;  Laterality: N/A;   LEFT HEART CATH AND CORONARY ANGIOGRAPHY N/A 08/08/2018   Procedure: LEFT HEART CATH AND CORONARY ANGIOGRAPHY;  Surgeon: Runell Gess, MD;  Location: [facility] INVASIVE CV LAB;  Service: Cardiovascular;   Laterality: N/A;   POLYPECTOMY  10/2017   SSA/TA's   TONSILLECTOMY AND ADENOIDECTOMY  1959   WISDOM TOOTH EXTRACTION  1975   Family History  Problem Relation Age of Onset   Prostate cancer Father    Arthritis Mother    Arthritis/Rheumatoid Brother    Colon cancer Neg Hx    Esophageal cancer Neg Hx    Pancreatic cancer Neg Hx    Rectal cancer Neg Hx    Stomach cancer Neg Hx    Heart disease Neg Hx    Colon polyps Neg Hx    Social History   Socioeconomic History   Marital status: Married    Spouse name: Not on file   Number of children: 2   Years of education: Not on file   Highest education level: Bachelor's degree (e.g., BA, AB, BS)  Occupational History   Occupation: Art gallery manager - retired  Tobacco Use   Smoking status: Never   Smokeless tobacco: Never  Vaping Use   Vaping Use: Never used  Substance and Sexual Activity   Alcohol use: Yes    Alcohol/week: 1.0 standard drink of alcohol    Types: 1 Standard drinks or equivalent per week    Comment: one glass or so a week   Drug use: No   Sexual activity: Not on file  Other Topics Concern   Not on file  Social History Narrative   Work or School: Biomedical engineer, 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      Home Situation: lives with wife      Spiritual Beliefs: none      Lifestyle: exercises 3 times per week               Social Determinants of Health   Financial Resource Strain: Low Risk  (05/06/2023)   Overall Financial Resource Strain (CARDIA)    Difficulty of Paying Living Expenses: Not hard at all  Food Insecurity: No Food Insecurity (05/06/2023)   Hunger Vital Sign    Worried About Running Out of Food in the Last Year: Never true    Ran Out of Food in the Last Year: Never true  Transportation Needs: No Transportation Needs (05/06/2023)   PRAPARE - Administrator, Civil Service (Medical): No    Lack of Transportation (Non-Medical): No  Physical Activity: Sufficiently Active (05/06/2023)   Exercise  Vital Sign    Days of Exercise per Week: 1 day    Minutes of Exercise per Session: 150+ min  Recent Concern: Physical Activity - Inactive (02/08/2023)   Exercise Vital Sign    Days of Exercise per Week: 0 days    Minutes of Exercise per Session: 60 min  Stress: No Stress Concern Present (05/06/2023)   [employer] of Occupational Health - Occupational Stress Questionnaire    Feeling of Stress : Not at all  Social Connections: Socially Integrated (05/06/2023)  Social Advertising account executive [NHANES]    Frequency of Communication with Friends and Family: More than three times a week    Frequency of Social Gatherings with Friends and Family: More than three times a week    Attends Religious Services: More than 4 times per year    Active Member of [employer] or Organizations: Yes    Attends Engineer, structural: More than 4 times per year    Marital Status: Married    Tobacco Counseling Counseling given: Not Answered   Clinical Intake:  Pre-visit preparation completed: Yes  Pain : No/denies pain     BMI - recorded: 27.33 Nutritional Status: BMI 25 -29 Overweight Nutritional Risks: None Diabetes: No  How often do you need to have someone help you when you read instructions, pamphlets, or other written materials from your doctor or pharmacy?: 1 - Never  Diabetic?  No  Interpreter Needed?: No  Information entered by :: Theresa Mulligan LPN   Activities of Daily Living    05/06/2023   10:16 AM 05/05/2023    3:54 PM  In your present state of health, do you have any difficulty performing the following activities:  Hearing? 0 0  Vision? 0 0  Difficulty concentrating or making decisions? 0 0  Walking or climbing stairs? 0 0  Dressing or bathing? 0 0  Doing errands, shopping? 0 0  Preparing Food and eating ? N N  Using the Toilet? N N  In the past six months, have you accidently leaked urine? N N  Do you have problems with loss of bowel control? N N  Managing  your Medications? N N  Managing your Finances? N N  Housekeeping or managing your Housekeeping? N N    Patient Care Team: Karie Georges, MD as PCP - General (Family Medicine) Tonny Bollman, MD as PCP - Cardiology (Cardiology)  Indicate any recent Medical Services you may have received from other than [facility] providers in the past year (date may be approximate).     Assessment:   This is a routine wellness examination for Konrad.  Hearing/Vision screen Hearing Screening - Comments:: Wears hearing aids Vision Screening - Comments:: Wears rx glasses - up to date with routine eye exams with  Dr Hyacinth Meeker  Dietary issues and exercise activities discussed: Current Exercise Habits: Home exercise routine, Type of exercise: walking, Time (Minutes): > 60, Frequency (Times/Week): 1, Weekly Exercise (Minutes/Week): 0, Intensity: Moderate, Exercise limited by: neurologic condition(s)   Goals Addressed               This Visit's Progress     No current goals (pt-stated)         Depression Screen    05/06/2023   10:01 AM 04/19/2022   10:51 AM 03/01/2022    9:08 AM 11/30/2021   12:47 PM 04/16/2021   12:57 PM 04/16/2021   12:49 PM 04/04/2020    9:00 AM  PHQ 2/9 Scores  PHQ - 2 Score 0 0 0 0 0 0 0  PHQ- 9 Score  0 0 0       Fall Risk    05/06/2023   10:17 AM 05/05/2023    3:54 PM 02/10/2023   11:34 AM 02/08/2023    5:02 PM 04/19/2022   10:54 AM  Fall Risk   Falls in the past year? 1 1 1 1  0  Number falls in past yr: 0 0 0 0 0  Injury with Fall? 1 1 1 1  0  Comment Rt hip injury. Followed by medical attention.      Risk for fall due to : No Fall Risks  No Fall Risks  No Fall Risks  Follow up Falls prevention discussed  Falls evaluation completed      FALL RISK PREVENTION PERTAINING TO THE HOME:  Any stairs in or around the home? Yes  If so, are there any without handrails? No  Home free of loose throw rugs in walkways, pet beds, electrical cords, etc? Yes  Adequate lighting in your  home to reduce risk of falls? Yes   ASSISTIVE DEVICES UTILIZED TO PREVENT FALLS:  Life alert? No  Use of a cane, walker or w/c? No  Grab bars in the bathroom? No  Shower chair or bench in shower? Yes Elevated toilet seat or a handicapped toilet? No   TIMED UP AND GO:  Was the test performed? Yes .  Length of time to ambulate 10 feet: 10 sec.   Gait steady and fast without use of assistive device  Cognitive Function:        05/06/2023   10:19 AM 04/19/2022   10:56 AM  6CIT Screen  What Year? 0 points 0 points  What month? 0 points 0 points  What time? 0 points 0 points  Count back from 20 0 points 0 points  Months in reverse 0 points 0 points  Repeat phrase 0 points 0 points  Total Score 0 points 0 points    Immunizations Immunization History  Administered Date(s) Administered   Influenza, High Dose Seasonal PF 12/01/2015, 10/27/2019   PFIZER(Purple Top)SARS-COV-2 Vaccination 02/19/2020, 03/11/2020   Pfizer Covid-19 Vaccine Bivalent Booster 67yrs & up 11/02/2022   Pneumococcal Conjugate-13 12/01/2015   Pneumococcal Polysaccharide-23 08/12/2017   Tdap 05/27/2009   Zoster, Live 08/24/2013    TDAP status: Due, Education has been provided regarding the importance of this vaccine. Advised may receive this vaccine at local pharmacy or Health Dept. Aware to provide a copy of the vaccination record if obtained from local pharmacy or Health Dept. Verbalized acceptance and understanding.    Pneumococcal vaccine status: Completed during today's visit.  Covid-19 vaccine status: Completed vaccines  Qualifies for Shingles Vaccine? Yes   Zostavax completed No   Shingrix Completed?: No.    Education has been provided regarding the importance of this vaccine. Patient has been advised to call insurance company to determine out of pocket expense if they have not yet received this vaccine. Advised may also receive vaccine at local pharmacy or Health Dept. Verbalized acceptance and  understanding.  Screening Tests Health Maintenance  Topic Date Due   DTaP/Tdap/Td (2 - Td or Tdap) 05/28/2019   COVID-19 Vaccine (7 - 2023-24 season) 05/22/2023 (Originally 12/28/2022)   Zoster Vaccines- Shingrix (1 of 2) 08/06/2023 (Originally 11/27/1998)   Medicare Annual Wellness (AWV)  05/05/2024   COLONOSCOPY (Pts 45-50yrs Insurance coverage will need to be confirmed)  06/18/2024   Pneumonia Vaccine 39+ Years old  Completed   Hepatitis C Screening  Completed   HPV VACCINES  Aged Out   INFLUENZA VACCINE  Discontinued    Health Maintenance  Health Maintenance Due  Topic Date Due   DTaP/Tdap/Td (2 - Td or Tdap) 05/28/2019    Colorectal cancer screening: Type of screening: Colonoscopy. Completed 06/18/21. Repeat every 3 years  Lung Cancer Screening: (Low Dose CT Chest recommended if Age 56-80 years, 30 pack-year currently smoking OR have quit w/in 15years.) does not qualify.     Additional Screening:  Hepatitis  C Screening: does qualify; Completed 08/12/17  Vision Screening: Recommended annual ophthalmology exams for early detection of glaucoma and other disorders of the eye. Is the patient up to date with their annual eye exam?  Yes  Who is the provider or what is the name of the office in which the patient attends annual eye exams? Dr Hyacinth Meeker If pt is not established with a provider, would they like to be referred to a provider to establish care? No .   Dental Screening: Recommended annual dental exams for proper oral hygiene  Community Resource Referral / Chronic Care Management:  CRR required this visit?  No   CCM required this visit?  No      Plan:     I have personally reviewed and noted the following in the patient's chart:   Medical and social history Use of alcohol, tobacco or illicit drugs  Current medications and supplements including opioid prescriptions. Patient is not currently taking opioid prescriptions. Functional ability and status Nutritional  status Physical activity Advanced directives List of other physicians Hospitalizations, surgeries, and ER visits in previous 12 months Vitals Screenings to include cognitive, depression, and falls Referrals and appointments  In addition, I have reviewed and discussed with patient certain preventive protocols, quality metrics, and best practice recommendations. A written personalized care plan for preventive services as well as general preventive health recommendations were provided to patient.     Tillie Rung, LPN   [id number]   Nurse Notes: None

## 2023-05-16 DIAGNOSIS — M5416 Radiculopathy, lumbar region: Secondary | ICD-10-CM | POA: Diagnosis not present

## 2023-05-19 ENCOUNTER — Telehealth: Payer: Self-pay | Admitting: Cardiovascular Disease

## 2023-05-19 NOTE — Telephone Encounter (Signed)
Pt c/o BP issue: STAT if pt c/o blurred vision, one-sided weakness or slurred speech  1. What are your last 5 BP readings?  142/92 (yesterday going in to dental visit) 150/93 (after dental visit) 158/93 (yesterday, 10:30 am)  2. Are you having any other symptoms (ex. Dizziness, headache, blurred vision, passed out)?   No  3. What is your BP issue?   Patient stated his BP has been reading high and he is concerned his afib may be coming back.  Patient wants to know if he is having afib can he go walking.

## 2023-05-19 NOTE — Telephone Encounter (Signed)
Spoke with the patient who reports that his blood pressure has been elevated when he has been to doctors appointments recently. He reports that his normal blood pressure is 110s/70s. He is not currently taking any blood pressure medications. He states that he does not keep up with his BP at home. He states that he had a fall about 5 months ago and injured his hip. He states that he has been getting steroid injections. He reports most recent injection on Monday. Patient has also been in pain due to this injury. Advised patient that elevated readings could be a combination of pain, steroids and anxiety. Advised him to keep up with his BP at home and call us with readings.  Patient also concerned that he might be back in atrial flutter and that is causing his BP to be elevated. Patient denies any palpitations. He reports heart rates in the 70s. He is not feeling similar to how he did prior to his ablation. Advised to continue to monitor.

## 2023-05-24 NOTE — Progress Notes (Unsigned)
Cardiology Office Note:    Date:  05/25/2023   ID:  Ronnie Vazquez, DOB December 07, 1946, MRN [id number]  PCP:  Karie Georges, MD [facility] Providers Cardiologist:  Tonny Bollman, MD {   Referring MD: Karie Georges, MD   Chief Complaint  Patient presents with   Irregular Heart Beat    History of Present Illness:    Ronnie Vazquez is a 77 y.o. male with a hx of typical atrial flutter s/p ablation, TIA, hemorrhoids, CAD, Carotid artery disease, GERD, esophageal dysphagia, b12 deficiency. Patient is followed by Dr. Excell Seltzer and presents today for evaluation of elevated BP.   Per chart review, patient was referred to [facility] in 2019 for evaluation of exertional dyspnea. Underwent nuclear stress testing on 08/04/18 that was a high risk study that was consistent with ischemia. Patient underwent LHC on 08/08/18 that showed 90% stenosis at the takeoff of a large first diagonal branch that was as large as the LAD. Treatment options were discussed among the interventionalists, and patient ultimately underwent successful bifurcation PCI with stenting of the proximal LAD. Nuclear stress test from 07/2019 was a normal, low risk study without evidence of ischemia or infarction. He has not been able to tolerate any lipid-lowering therapies since that time. He has had weakness and myalgias with nexletol, PCSK9 inhibitors, statins, and zetia.   At an appointment with his PCP in 07/2020, he was found to be in new onset atrial flutter. He was started on eliquis 5 mg BID and metoprolol succinate 12.5 mg daily. He underwent echocardiogram on 09/12/20 that showed EF 60-65%, no regional wall motion abnormalities, normal RV systolic function, borderline dilation at the level of the sinuses of Valsalva measuring 37 mm. He underwent atrial flutter ablation on 10/24/20. His BB and anticoagulation were discontinued.   Patient was last seen by cardiology on 12/08/22. At that time, he denied chest pain,  pressure, sob. He had an exercise stress test on 12/13/22 that was a normal, low risk study without evidence of ischemia.   Today, patient reports that he has been getting a lot of steroid injections for his hip pain. He is concerned that the steroids may be affecting his heart. He denies chest pain, pressure. Denies symptoms similar to what he felt before getting stent placed in the past. Denies tachycardia. He has been checking his BP regularly, and his BP has been higher than usual. His machine has alerted him that his HR is sometimes irregular. When the machine tells him that his HR is irregular, he can tell that his heart is not beating in rhythm. He has been more anxious that usual, and he plans on asking his PCP about anxiety medications. Denies syncope, near syncope, ankle edema.      Past Medical History:  Diagnosis Date   Arthritis    bilateral feet   Atrial flutter (HCC)    Echo 9/21: EF 60-65, no RWMA, normal RVSF, trivial MR, borderline dilation of aorta at sinus of Valsalva (37 mm), mild LAE, RVSP 26.8   Chicken pox    hx   Coronary artery disease    08/29/18 PCI/DES with bifurcation from LAD into 1st diag.    GERD (gastroesophageal reflux disease)    with certain foods/on OTC meds   Hyperlipidemia    on meds   Left shoulder pain 02/02/2013   Microscopic hematuria 02/02/2013   Vision loss of left eye     Past Surgical History:  Procedure Laterality Date  A-FLUTTER ABLATION N/A 10/24/2020   Procedure: A-FLUTTER ABLATION;  Surgeon: Duke Salvia, MD;  Location: [facility] INVASIVE CV LAB;  Service: Cardiovascular;  Laterality: N/A;   COLONOSCOPY  10/2017   MS-suprep(good)-SSA/TICS/hems/TA's-3 yr recall   CORONARY BALLOON ANGIOPLASTY N/A 08/29/2018   Procedure: CORONARY BALLOON ANGIOPLASTY;  Surgeon: Tonny Bollman, MD;  Location: [facility] INVASIVE CV LAB;  Service: Cardiovascular;  Laterality: N/A;   CORONARY STENT INTERVENTION N/A 08/29/2018   Procedure: CORONARY STENT INTERVENTION;   Surgeon: Tonny Bollman, MD;  Location: [facility] INVASIVE CV LAB;  Service: Cardiovascular;  Laterality: N/A;   ESOPHAGEAL MANOMETRY N/A 10/08/2020   Procedure: ESOPHAGEAL MANOMETRY (EM);  Surgeon: Meryl Dare, MD;  Location: [facility] ENDOSCOPY;  Service: Endoscopy;  Laterality: N/A;   LEFT HEART CATH AND CORONARY ANGIOGRAPHY N/A 08/08/2018   Procedure: LEFT HEART CATH AND CORONARY ANGIOGRAPHY;  Surgeon: Runell Gess, MD;  Location: [facility] INVASIVE CV LAB;  Service: Cardiovascular;  Laterality: N/A;   POLYPECTOMY  10/2017   SSA/TA's   TONSILLECTOMY AND ADENOIDECTOMY  1959   WISDOM TOOTH EXTRACTION  1975    Current Medications: Current Meds  Medication Sig   aspirin EC 81 MG tablet Take 81 mg by mouth. Only takes occasionally Swallow whole.   clotrimazole (ANTIFUNGAL, CLOTRIMAZOLE,) 1 % cream Apply 1 application topically 2 (two) times daily.   ezetimibe (ZETIA) 10 MG tablet TAKE ONE TABLET BY MOUTH DAILY   methocarbamol (ROBAXIN) 500 MG tablet TAKE ONE TABLET BY MOUTH EVERY 6 HOURS AS NEEDED FOR MUSCLE SPASMS   metoprolol succinate (TOPROL XL) 25 MG 24 hr tablet Take 0.5 tablets (12.5 mg total) by mouth daily.   pantoprazole (PROTONIX) 40 MG tablet TAKE 1 TABLET BY MOUTH DAILY     Allergies:   Repatha [evolocumab], Bempedoic acid, Crestor [rosuvastatin calcium], and Statins   Social History   Socioeconomic History   Marital status: Married    Spouse name: Not on file   Number of children: 2   Years of education: Not on file   Highest education level: Bachelor's degree (e.g., BA, AB, BS)  Occupational History   Occupation: Art gallery manager - retired  Tobacco Use   Smoking status: Never   Smokeless tobacco: Never  Vaping Use   Vaping Use: Never used  Substance and Sexual Activity   Alcohol use: Yes    Alcohol/week: 1.0 standard drink of alcohol    Types: 1 Standard drinks or equivalent per week    Comment: one glass or so a week   Drug use: No   Sexual activity: Not on file  Other  Topics Concern   Not on file  Social History Narrative   Work or School: Biomedical engineer, 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      Home Situation: lives with wife      Spiritual Beliefs: none      Lifestyle: exercises 3 times per week               Social Determinants of Health   Financial Resource Strain: Low Risk  (05/06/2023)   Overall Financial Resource Strain (CARDIA)    Difficulty of Paying Living Expenses: Not hard at all  Food Insecurity: No Food Insecurity (05/06/2023)   Hunger Vital Sign    Worried About Running Out of Food in the Last Year: Never true    Ran Out of Food in the Last Year: Never true  Transportation Needs: No Transportation Needs (05/06/2023)   PRAPARE - Administrator, Civil Service (Medical):  No    Lack of Transportation (Non-Medical): No  Physical Activity: Sufficiently Active (05/06/2023)   Exercise Vital Sign    Days of Exercise per Week: 1 day    Minutes of Exercise per Session: 150+ min  Recent Concern: Physical Activity - Inactive (02/08/2023)   Exercise Vital Sign    Days of Exercise per Week: 0 days    Minutes of Exercise per Session: 60 min  Stress: No Stress Concern Present (05/06/2023)   [employer] of Occupational Health - Occupational Stress Questionnaire    Feeling of Stress : Not at all  Social Connections: Socially Integrated (05/06/2023)   Social Connection and Isolation Panel [NHANES]    Frequency of Communication with Friends and Family: More than three times a week    Frequency of Social Gatherings with Friends and Family: More than three times a week    Attends Religious Services: More than 4 times per year    Active Member of [employer] or Organizations: Yes    Attends Engineer, structural: More than 4 times per year    Marital Status: Married     Family History: The patient's family history includes Arthritis in his mother; Arthritis/Rheumatoid in his brother; Prostate cancer in his father. There is no  history of Colon cancer, Esophageal cancer, Pancreatic cancer, Rectal cancer, Stomach cancer, Heart disease, or Colon polyps.  ROS:   Please see the history of present illness.     All other systems reviewed and are negative.  EKGs/Labs/Other Studies Reviewed:    The following studies were reviewed today: Cardiac Studies & Procedures   CARDIAC CATHETERIZATION  CARDIAC CATHETERIZATION 08/29/2018  Narrative Successful bifurcation PCI with stenting of the proximal LAD using a 3.0x30 mm Resolute Onyx DES and angioplasty of the first diagonal (through the LAD stent struts) with a 3.0 mm balloon  Recommend uninterrupted dual antiplatelet therapy with Aspirin 81mg  daily and Clopidogrel 75mg  daily for a minimum of 12 months with bifurcation lesion.  Findings Coronary Findings Diagnostic  Dominance: Right  Left Anterior Descending Prox LAD-1 lesion is 90% stenosed. Prox LAD-2 lesion is 70% stenosed.  First Diagonal Branch Ost 1st Diag lesion is 75% stenosed. ostial diagonal lesion  Intervention  Prox LAD-1 lesion Stent (Also treats lesions: Prox LAD-2) Lesion crossed with guidewire using a WIRE COUGAR XT STRL 190CM. Pre-stent angioplasty was performed using a BALLOON SAPPHIRE [place] 2.5X12. A drug-eluting stent was successfully placed using a STENT RESOLUTE ONYX 3.0X30. Post-stent angioplasty was performed using a BALLOON SAPPHIRE [place] 3.25X18. Maximum pressure:  14 atm. The LAD lesion is initially crossed with a cougar wire.  The LAD is predilated with a 2.5 mm balloon.  Because the lesion involves the LAD diagonal bifurcation, the diagonal was also wired and attempts are made to predilate the diagonal without success.  Attempts were then made to stent the LAD but a 3.0 x 30 mm resolute Onyx would not cross the mid segment of the vessel.  The LAD is then redilated with a 2.5 mm noncompliant balloon to 12 atm and this facilitated passage of the stent, now without difficulty.  The stent is carefully  positioned and deployed so that it crosses the diagonal origin and covers the entirety of the LAD segmental disease.  The stent is deployed at 14 atm.  The diagonal branch is then rewired and dilated through the stent struts of the LAD stent.  Finally, the LAD stent is postdilated with a 3.25 mm noncompliant balloon to 14 atm throughout the  stented segment.  At the completion of the procedure, there is TIMI-3 flow in both the LAD and diagonal and the patient is chest pain-free.  There is 0% residual stenosis at the LAD site and 20% residual stenosis at the diagonal site.  The Post-Intervention Lesion Assessment The intervention was successful. Pre-interventional TIMI flow is 3. Post-intervention TIMI flow is 3. No complications occurred at this lesion. There is a 0% residual stenosis post intervention.  Prox LAD-2 lesion Stent (Also treats lesions: Prox LAD-1) See details in Prox LAD-1 lesion. Post-Intervention Lesion Assessment The intervention was successful. Pre-interventional TIMI flow is 3. Post-intervention TIMI flow is 3. No complications occurred at this lesion. There is a 0% residual stenosis post intervention.  Ost 1st Diag lesion Angioplasty WIRE COUGAR XT STRL 190CM guidewire used to cross lesion. Balloon angioplasty was performed using a BALLOON SAPPHIRE 3.0X10. Maximum pressure: 8 atm. This lesion is treated after stenting of the LAD.  Initially, the diagonal branches wired with a cougar guidewire.  An attempt was made to predilate the branch but a 2.5 mm balloon would not cross the ostium.  I elected to treat the LAD first and this branches stented with a 3.0 x 30 mm resolute Onyx DES.  After stenting, the diagonal is rewired with a Administrator, arts.  The initial trapped wire is removed.  The diagonal ostium is then dilated with a 2.5 mm balloon to 8 atm.  There was significant residual stenosis and after the LAD is postdilated, the diagonal is redilated with a 3.0 mm balloon to 8 atm for 45  seconds.  At the completion of the procedure there is an acceptable result with only 10 to 20% residual stenosis in the diagonal and TIMI-3 flow. Post-Intervention Lesion Assessment The intervention was successful. Pre-interventional TIMI flow is 3. Post-intervention TIMI flow is 3. No complications occurred at this lesion. There is a 20% residual stenosis post intervention.   CARDIAC CATHETERIZATION  CARDIAC CATHETERIZATION 08/08/2018  Narrative Images from the original result were not included.   Prox LAD-1 lesion is 90% stenosed.  Prox LAD-2 lesion is 70% stenosed.  The left ventricular systolic function is normal.  LV end diastolic pressure is normal.  The left ventricular ejection fraction is 55-65% by visual estimate.   Ronnie Vazquez is a 77 y.o. male   [id number] LOCATION:  FACILITY: [facility] PHYSICIAN: Nanetta Batty, M.D. 05-19-1946   DATE OF PROCEDURE:  08/08/2018  DATE OF DISCHARGE:     CARDIAC CATHETERIZATION    History obtained from chart review.Anshuman Alamillo is a 77 y.o. mildly overweight married Caucasian male father of 2, grandfather of 5 grandchildren referred by Dr. Selena Batten for cardiovascular evaluation because of new onset exertional chest pain.  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.  His risk factors include untreated hyperlipidemia because of statin intolerance.  There is no family history.  He is never had a heart attack or stroke.  He noticed new onset exertional chest tightness 6 to 8 weeks ago when walking up an incline or on the treadmill.  This resolves with stopping the activity.  It does not radiate.  A Myoview stress test was performed that showed ischemia in the LAD territory.  Based on this, it was elected to proceed with outpatient diagnostic coronary angiography.  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  This is a nondominant vessel and at most 2 to 20 mm in diameter.  It does  not give off marginal branches.  His RCA is large dominant and free of disease.  His LAD has a 90% stenosis at the takeoff of a large first diagonal branch which is as big as the LAD.  There is a 77 to 75% stenosis just beyond this.  The issue has whether to stent across the diagonal branch with a long stent and provisionally intervene on the ostium of it of the diagonal branch is compromised, have an upfront to stent strategy or suggest a LIMA to the LAD and diagonal branch which would survive long-term patency of antiplatelet medications.  He does have normal LV function.  His angina is completely exertional.  I will review the options with my colleagues before making a final decision regarding street treatment strategy.  The sheath was removed and a TR band was placed on the right wrist to achieve patent hemostasis.  The patient left the lab in stable condition.  Nanetta Batty. MD, [facility] 08/08/2018 8:33 AM       Findings Coronary Findings Diagnostic  Dominance: Right  Left Anterior Descending Prox LAD-1 lesion is 90% stenosed. Prox LAD-2 lesion is 70% stenosed.  Intervention  No interventions have been documented.   STRESS TESTS  MYOCARDIAL PERFUSION IMAGING 12/13/2022  Narrative   The study is normal. The study is low risk.   A Bruce protocol stress test was performed. Exercise capacity was normal. Patient exercised for 8 min and 0 sec. Maximum HR of 125 bpm. MPHR 85.0 %. Peak METS 9.6 . The patient experienced no angina during the test. The test was stopped because the patient experienced fatigue and dyspnea. The patient reported dyspnea and fatigue during the stress test. Normal blood pressure and normal heart rate response noted during stress. Heart rate recovery was normal.   LV perfusion is normal. There is no evidence of ischemia. There is no evidence of infarction.   2.0 mm of horizontal ST depression (I, II, III, aVF, V5 and V6) was noted. ST depression began at minute 6:50.  ST deviation returned to baseline after 1-5 mins of recovery. Arrhythmias during stress: occasional PVCs. Arrhythmias during recovery: occasional PVCs. The ECG was positive for ischemia.   Left ventricular function is normal. Nuclear stress EF: 61 %. The left ventricular ejection fraction is normal (55-65%). End diastolic cavity size is normal. End systolic cavity size is normal.   Prior study available for comparison from 07/30/2019. No changes in perfusion compared to prior study.   ECHOCARDIOGRAM  ECHOCARDIOGRAM COMPLETE 09/12/2020  Narrative ECHOCARDIOGRAM REPORT    Patient Name:   Ronnie Vazquez Date of Exam: 09/12/2020 Medical Rec #:  [id number]     Height:       70.0 in Accession #:    [id number]    Weight:       179.0 lb Date of Birth:  1946-07-05     BSA:          1.991 m Patient Age:    73 years      BP:           102/80 mmHg Patient Gender: M             HR:           108 bpm. Exam Location:  [address]  Procedure: 2D Echo, 3D Echo, Cardiac Doppler and Color Doppler  Indications:    I48.3 Atrial Flutter  History:        Patient has prior history of Echocardiogram  examinations, most recent 08/13/2013. CAD, Arrythmias:Atrial Flutter; Risk Factors:Family History of Coronary Artery Disease.  Sonographer:    Farrel Conners RDCS Referring Phys: [id number] Ever[address] Bio WEAVER  IMPRESSIONS   1. Left ventricular ejection fraction, by estimation, is 60 to 65%. The left ventricle has normal function. The left ventricle has no regional wall motion abnormalities. Indeterminant diastolic function due to atrial flutter. 2. Right ventricular systolic function is normal. The right ventricular size is normal. There is normal pulmonary artery systolic pressure. 3. The mitral valve is normal in structure. Trivial mitral valve regurgitation. No evidence of mitral stenosis. 4. The aortic valve is normal in structure. Aortic valve regurgitation is not visualized. No aortic stenosis is present. 5. The  inferior vena cava is normal in size with greater than 50% respiratory variability, suggesting right atrial pressure of 3 mmHg. 6. Aortic dilatation noted. There is borderline dilatation at the level of the sinuses of Valsalva, measuring 37 mm. 7. Left atrial size was mildly dilated.  FINDINGS Left Ventricle: Left ventricular ejection fraction, by estimation, is 55 to 60%. The left ventricle has normal function. The left ventricle has no regional wall motion abnormalities. The left ventricular internal cavity size was normal in size. There is no left ventricular hypertrophy. Indeterminant due to atrial flutter. Normal left ventricular filling pressure.  Right Ventricle: The right ventricular size is normal. No increase in right ventricular wall thickness. Right ventricular systolic function is normal. There is normal pulmonary artery systolic pressure. The tricuspid regurgitant velocity is 2.44 m/s, and with an assumed right atrial pressure of 3 mmHg, the estimated right ventricular systolic pressure is 26.8 mmHg.  Left Atrium: Left atrial size was mildly dilated.  Right Atrium: Right atrial size was normal in size.  Pericardium: There is no evidence of pericardial effusion.  Mitral Valve: The mitral valve is normal in structure. Trivial mitral valve regurgitation. No evidence of mitral valve stenosis.  Tricuspid Valve: The tricuspid valve is normal in structure. Tricuspid valve regurgitation is trivial. No evidence of tricuspid stenosis.  Aortic Valve: The aortic valve is normal in structure. Aortic valve regurgitation is not visualized. No aortic stenosis is present.  Pulmonic Valve: The pulmonic valve was normal in structure. Pulmonic valve regurgitation is not visualized. No evidence of pulmonic stenosis.  Aorta: Aortic dilatation noted. There is borderline dilatation at the level of the sinuses of Valsalva, measuring 37 mm.  Venous: The inferior vena cava is normal in size with  greater than 50% respiratory variability, suggesting right atrial pressure of 3 mmHg.  IAS/Shunts: No atrial level shunt detected by color flow Doppler.   LEFT VENTRICLE PLAX 2D LVIDd:         4.30 cm  Diastology LVIDs:         3.10 cm  LV e' medial:    11.60 cm/s LV PW:         1.10 cm  LV E/e' medial:  7.4 LV IVS:        1.10 cm  LV e' lateral:   11.20 cm/s LVOT diam:     2.60 cm  LV E/e' lateral: 7.6 LV SV:         67 LV SV Index:   34 LVOT Area:     5.31 cm  3D Volume EF: 3D EF:        64 % LV EDV:       112 ml LV ESV:       40 ml LV SV:  72 ml  RIGHT VENTRICLE RV S prime:     12.70 cm/s TAPSE (M-mode): 1.1 cm  LEFT ATRIUM             Index       RIGHT ATRIUM           Index LA diam:        4.70 cm 2.36 cm/m  RA Area:     19.70 cm LA Vol (A2C):   60.5 ml 30.38 ml/m RA Volume:   59.80 ml  30.03 ml/m LA Vol (A4C):   74.8 ml 37.57 ml/m LA Biplane Vol: 68.7 ml 34.50 ml/m AORTIC VALVE LVOT Vmax:   76.80 cm/s LVOT Vmean:  50.233 cm/s LVOT VTI:    0.127 m  AORTA Ao Root diam: 3.70 cm Ao Asc diam:  3.40 cm  MITRAL VALVE               TRICUSPID VALVE MV Area (PHT): cm         TR Peak grad:   23.8 mmHg MV Decel Time: 114 msec    TR Vmax:        244.00 cm/s MV E velocity: 85.40 cm/s MV A velocity: 34.67 cm/s  SHUNTS MV E/A ratio:  2.46        Systemic VTI:  0.13 m Systemic Diam: 2.60 cm  Armanda Magic MD Electronically signed by Armanda Magic MD Signature Date/Time: 09/12/2020/11:20:13 AM    Final              EKG:  EKG is ordered today.  The ekg ordered today demonstrates sinus rhythm with PACs, HR 82 BPM   Recent Labs: No results found for requested labs within last 365 days.  Recent Lipid Panel    Component Value Date/Time   CHOL 187 02/01/2022 0950   TRIG 163 (H) 02/01/2022 0950   HDL 41 02/01/2022 0950   CHOLHDL 4.6 02/01/2022 0950   CHOLHDL 4.2 10/13/2020 0854   VLDL 29.0 06/15/2019 0809   LDLCALC 117 (H) 02/01/2022 0950   LDLCALC 106  (H) 10/13/2020 0854   LDLDIRECT 117.0 08/12/2017 0921     Risk Assessment/Calculations:    CHA2DS2-VASc Score = 2  This indicates a 2.2% annual risk of stroke. The patient's score is based upon: CHF History: 0 HTN History: 0 Diabetes History: 0 Stroke History: 0 Vascular Disease History: 1 Age Score: 1 Gender Score: 0        Physical Exam:    VS:  BP 118/74   Pulse 82   Ht 5\' 10"  (1.778 m)   Wt 188 lb 12.8 oz (85.6 kg)   SpO2 98%   BMI 27.09 kg/m     Wt Readings from Last 3 Encounters:  05/25/23 188 lb 12.8 oz (85.6 kg)  05/06/23 190 lb 8 oz (86.4 kg)  02/10/23 188 lb 3.2 oz (85.4 kg)     GEN:  Well nourished, well developed in no acute distress. Sitting comfortably on the side of the exam table  HEENT: Normal NECK: No JVD; No carotid bruits CARDIAC: RRR, no murmurs, rubs, gallops. Radial pulses 2+ bilaterally  RESPIRATORY:  Crackles in bilateral lung bases, otherwise clear to auscultation bilaterally  ABDOMEN: Soft, non-tender, non-distended MUSCULOSKELETAL:  No edema in BLE. Some varicose veins noted bilaterally ; No deformity  SKIN: Warm and dry NEUROLOGIC:  Alert and oriented x 3 PSYCHIATRIC:  Normal affect   ASSESSMENT:    1. Elevated blood pressure reading   2. Atrial flutter, unspecified  type (HCC)   3. CAD in native artery   4. Dilatation of aorta (HCC)   5. Mild carotid artery disease (HCC)   6. Other general symptoms and signs   7. Pure hypercholesterolemia   8. Irregular heart beat    PLAN:    In order of problems listed above:  Irregular Heart Beat  Typical Atrial Flutter s/p ablation in 09/2020 - Shortly after ablation, patient was taken off of his beta blocker and anticoagulation - Patient has received multiple steroid injections for hip pain in the past few months. He has noticed that his BP cuff reports his HR as "irregular" multiple times since then. He denies tachycardia, but he can feel when his HR is irregular. Symptoms sometimes  last for a few hours at a time  - EKG in office today showed sinus rhythm with PACs, HR 82 BPM  - Due to sustained periods of irregular HR and history of atrial flutter, ordered 2 week zio  - Ordered echocardiogram  - Ordered TSH, free T4, BMP, Mag level  - Start metoprolol succinate 12.5 mg daily   CAD - Patient underwent bifurcation PCI with stenting of the proximal LAD in 2019  - Most recent nuclear stress test from 11/2022 was a normal, low risk study without evidence of ischemia  - He denies chest pain, pressure, tightness. No symptoms similar to what he felt prior to his stent in 2019  - Patient has intolerances to multiple cholesterol lowering has tried nexletol, PCSK9 inhibitors, statins. Remains on zetia  - Continue ASA 81 mg daily  - Starting metoprolol succinate 12.5 mg daily   HLD  - treatment options are very limited due to patient's medication intolerances - Most recent lipid panel from 01/2022 showed LDL 117, HDL 41, triglycerides 163, total cholesterol 187  - Ordered fasting lipid panel this AM  - Consider treatment with inclisiran pending results   Aortic Dilation  - Echocardiogram from 08/2020 showed borderline dilation at the level of the sinuses of Valsalva measuring 37 mm  - Ordered repeat echocardiogram   Elevated BP reading  - Patient went to the dentist recently and was found to have BP 158/93 - Patient brought a blood pressure log, and SBP was predominantly between 125-135 - Start metoprolol succinate 12.5 mg daily as above   Medication Adjustments/Labs and Tests Ordered: Current medicines are reviewed at length with the patient today.  Concerns regarding medicines are outlined above.  Orders Placed This Encounter  Procedures   TSH   T4, free   Basic metabolic panel   Magnesium   Lipid panel   LONG TERM MONITOR (3-14 DAYS)   EKG 12-Lead   ECHOCARDIOGRAM COMPLETE   Meds ordered this encounter  Medications   metoprolol succinate (TOPROL XL) 25 MG 24  hr tablet    Sig: Take 0.5 tablets (12.5 mg total) by mouth daily.    Dispense:  45 tablet    Refill:  3    Patient Instructions  Medication Instructions:  Your physician has recommended you make the following change in your medication:   1) START metoprolol succinate (Toprol XL) 12.5mg  daily  *If you need a refill on your cardiac medications before your next appointment, please call your pharmacy*  Lab Work: TODAY: TSH, Free T4, BMP, magnesium, Lipid If you have labs (blood work) drawn today and your tests are completely normal, you will receive your results only by: MyChart Message (if you have MyChart) OR A paper copy in the mail  If you have any lab test that is abnormal or we need to change your treatment, we will call you to review the results.  Testing/Procedures: Your physician has requested that you wear a Zio heart monitor for 14 days. This will be mailed to your home with instructions on how to apply the monitor and how to return it when finished. Please allow 2 weeks after returning the heart monitor before our office calls you with the results.   Your physician has requested that you have an echocardiogram. Echocardiography is a painless test that uses sound waves to create images of your heart. It provides your doctor with information about the size and shape of your heart and how well your heart's chambers and valves are working. This procedure takes approximately one hour. There are no restrictions for this procedure. Please do NOT wear cologne, perfume, aftershave, or lotions (deodorant is allowed). Please arrive 15 minutes prior to your appointment time.  Follow-Up: At [facility], you and your health needs are our priority.  As part of our continuing mission to provide you with exceptional heart care, we have created designated Provider Care Teams.  These Care Teams include your primary Cardiologist (physician) and Advanced Practice Providers (APPs -  Physician  Assistants and Nurse Practitioners) who all work together to provide you with the care you need, when you need it.  Your next appointment:   7-8 month(s) (December 2024)  The format for your next appointment:   In Person  Provider:   Tonny Bollman, MD {  Other Instructions ZIO XT- Long Term Monitor Instructions     Your physician has requested you wear a ZIO patch monitor for 14 days.  This is a single patch monitor. [employer] supplies one patch monitor per enrollment. Additional  stickers are not available. Please do not apply patch if you will be having a Nuclear Stress Test,  Echocardiogram, Cardiac CT, MRI, or Chest Xray during the period you would be wearing the  monitor. The patch cannot be worn during these tests. You cannot remove and re-apply the  ZIO XT patch monitor.  Your ZIO patch monitor will be mailed 3 day USPS to your address on file. It may take 3-5 days  to receive your monitor after you have been enrolled.  Once you have received your monitor, please review the enclosed instructions. Your monitor  has already been registered assigning a specific monitor serial # to you.     Billing and Patient Assistance Program Information     We have supplied [employer] with any of your insurance information on file for billing purposes.  [employer] offers a sliding scale Patient Assistance Program for patients that do not have  insurance, or whose insurance does not completely cover the cost of the ZIO monitor.  You must apply for the Patient Assistance Program to qualify for this discounted rate.  To apply, please call [employer] at [phone], select option 4, select option 2, ask to apply for  Patient Assistance Program. Meredeth Ide will ask your household income, and how many people  are in your household. They will quote your out-of-pocket cost based on that information.  [employer] will also be able to set up a 68-month, interest-free payment plan if needed.     Applying the monitor      Shave hair from upper left chest.  Hold abrader disc by orange tab. Rub abrader in 40 strokes over the upper left chest as  indicated in your monitor instructions.  Clean  area with 4 enclosed alcohol pads. Let dry.  Apply patch as indicated in monitor instructions. Patch will be placed under collarbone on left  side of chest with arrow pointing upward.  Rub patch adhesive wings for 2 minutes. Remove white label marked "1". Remove the white  label marked "2". Rub patch adhesive wings for 2 additional minutes.  While looking in a mirror, press and release button in center of patch. A small green light will  flash 3-4 times. This will be your only indicator that the monitor has been turned on.  Do not shower for the first 24 hours. You may shower after the first 24 hours.  Press the button if you feel a symptom. You will hear a small click. Record Date, Time and  Symptom in the Patient Logbook.  When you are ready to remove the patch, follow instructions on the last 2 pages of Patient  Logbook. Stick patch monitor onto the last page of Patient Logbook.  Place Patient Logbook in the blue and white box. Use locking tab on box and tape box closed  securely. The blue and white box has prepaid postage on it. Please place it in the mailbox as  soon as possible. Your physician should have your test results approximately 7 days after the  monitor has been mailed back to [facility].  Call [facility] Customer Care at [phone] if you have questions regarding  your ZIO XT patch monitor. Call them immediately if you see an orange light blinking on your  monitor.  If your monitor falls off in less than 4 days, contact our Monitor department at [phone].  If your monitor becomes loose or falls off after 4 days call [employer] at [phone] for  suggestions on securing your monitor.     Signed, Jonita Albee, PA-C  05/25/2023 11:24 AM    [facility]

## 2023-05-24 NOTE — Progress Notes (Unsigned)
Cardiology Office Note    Date:  05/24/2023   ID:  Ronnie Vazquez, DOB [birth year], MRN [id number]  PCP:  Karie Georges, MD  Cardiologist:  Tonny Bollman, MD  Electrophysiologist:  None   Chief Complaint: ***  History of Present Illness:   Ronnie Vazquez is a 77 y.o. male with history of CAD, atrial flutter s/p ablation 2021 no longer on OAC, HLD, arthritis, CKD 3a by labs, microscopic hematuria, mild bilateral carotid artery disease 2019 seen for concerns for elevated BP. The patient underwent PCI of the LAD/first diagonal bifurcation and 2019 when he underwent LAD stenting.  Since that time, he has been unable to tolerate any lipid-lowering therapies.  He has tried all statin and nonstatin drugs without success.  He has weakness and myalgias with lipid-lowering medications including Nexletol, PCSK9 inhibitors, and even the lowest doses of statin drugs. Last echo 2021 showed normal EF, borderline dilation of aorta at level of sinuses of valsalva at 37mm. Last nuc 11/2022 showed abnormal ST changes but perfusion was normal so Dr. Excell Seltzer felt this represented excellent result.  ?carotid 2019 Echo, aorta  Elevated blood pressure Atrial flutter CAD Mild dilation of aorta Mild carotid artery disease   Labwork independently reviewed:  01/2022 LDL 117, trig 163, K 4.8, Cr 1.48, CBC wnl 2022 LFTs ok 2021 TSH wnl    Past History   Past Medical History:  Diagnosis Date   Arthritis    bilateral feet   Atrial flutter (HCC)    Echo 9/21: EF 60-65, no RWMA, normal RVSF, trivial MR, borderline dilation of aorta at sinus of Valsalva (37 mm), mild LAE, RVSP 26.8   Chicken pox    hx   Coronary artery disease    08/29/18 PCI/DES with bifurcation from LAD into 1st diag.    GERD (gastroesophageal reflux disease)    with certain foods/on OTC meds   Hyperlipidemia    on meds   Left shoulder pain 02/02/2013   Microscopic hematuria 02/02/2013   Vision loss of left eye     Past Surgical  History:  Procedure Laterality Date   A-FLUTTER ABLATION N/A 10/24/2020   Procedure: A-FLUTTER ABLATION;  Surgeon: Duke Salvia, MD;  Location: [facility] INVASIVE CV LAB;  Service: Cardiovascular;  Laterality: N/A;   COLONOSCOPY  10/2017   MS-suprep(good)-SSA/TICS/hems/TA's-3 yr recall   CORONARY BALLOON ANGIOPLASTY N/A 08/29/2018   Procedure: CORONARY BALLOON ANGIOPLASTY;  Surgeon: Tonny Bollman, MD;  Location: [facility] INVASIVE CV LAB;  Service: Cardiovascular;  Laterality: N/A;   CORONARY STENT INTERVENTION N/A 08/29/2018   Procedure: CORONARY STENT INTERVENTION;  Surgeon: Tonny Bollman, MD;  Location: [facility] INVAS[facility] CV LAB;  Service: Cardiovascular;  Laterality: N/A;   ESOPHAGEAL MANOMETRY N/A 10/08/2020   Procedure: ESOPHAGEAL MANOMETRY (EM);  Surgeon: Meryl Dare, MD;  Location: [facility] ENDOSCOPY;  Service: Endoscopy;  Laterality: N/A;   LEFT HEART CATH AND CORONARY ANGIOGRAPHY N/A 08/08/2018   Procedure: LEFT HEART CATH AND CORONARY ANGIOGRAPHY;  Surgeon: Runell Gess, MD;  Location: [facility] INVASIVE CV LAB;  Service: Cardiovascular;  Laterality: N/A;   POLYPECTOMY  10/2017   SSA/TA's   TONSILLECTOMY AND ADENOIDECTOMY  1959   WISDOM TOOTH EXTRACTION  1975    Current Medications: No outpatient medications have been marked as taking for the 05/25/23 encounter (Appointment) with Laurann Montana, PA-C.   ***   Allergies:   Repatha [evolocumab], Bempedoic acid, Crestor [rosuvastatin calcium], and Statins   Social History   Socioeconomic History   Marital  status: Married    Spouse name: Not on file   Number of children: 2   Years of education: Not on file   Highest education level: Bachelor's degree (e.g., BA, AB, BS)  Occupational History   Occupation: Art gallery manager - retired  Tobacco Use   Smoking status: Never   Smokeless tobacco: Never  Vaping Use   Vaping Use: Never used  Substance and Sexual Activity   Alcohol use: Yes    Alcohol/week: 1.0 standard drink of alcohol    Types: 1 Standard  drinks or equivalent per week    Comment: one glass or so a week   Drug use: No   Sexual activity: Not on file  Other Topics Concern   Not on file  Social History Narrative   Work or School: Biomedical engineer, 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      Home Situation: lives with wife      Spiritual Beliefs: none      Lifestyle: exercises 3 times per week               Social Determinants of Health   Financial Resource Strain: Low Risk  (05/06/2023)   Overall Financial Resource Strain (CARDIA)    Difficulty of Paying Living Expenses: Not hard at all  Food Insecurity: No Food Insecurity (05/06/2023)   Hunger Vital Sign    Worried About Running Out of Food in the Last Year: Never true    Ran Out of Food in the Last Year: Never true  Transportation Needs: No Transportation Needs (05/06/2023)   PRAPARE - Administrator, Civil Service (Medical): No    Lack of Transportation (Non-Medical): No  Physical Activity: Sufficiently Active (05/06/2023)   Exercise Vital Sign    Days of Exercise per Week: 1 day    Minutes of Exercise per Session: 150+ min  Recent Concern: Physical Activity - Inactive (02/08/2023)   Exercise Vital Sign    Days of Exercise per Week: 0 days    Minutes of Exercise per Session: 60 min  Stress: No Stress Concern Present (05/06/2023)   [employer] of Occupational Health - Occupational Stress Questionnaire    Feeling of Stress : Not at all  Social Connections: Socially Integrated (05/06/2023)   Social Connection and Isolation Panel [NHANES]    Frequency of Communication with Friends and Family: More than three times a week    Frequency of Social Gatherings with Friends and Family: More than three times a week    Attends Religious Services: More than 4 times per year    Active Member of [employer] or Organizations: Yes    Attends Engineer, structural: More than 4 times per year    Marital Status: Married     Family History:  The patient's ***family  history includes Arthritis in his mother; Arthritis/Rheumatoid in his brother; Prostate cancer in his father. There is no history of Colon cancer, Esophageal cancer, Pancreatic cancer, Rectal cancer, Stomach cancer, Heart disease, or Colon polyps.  ROS:   Please see the history of present illness. Otherwise, review of systems is positive for ***.  All other systems are reviewed and otherwise negative.    EKG(s)/Additional Testing   EKG:  EKG is ordered today, personally reviewed, demonstrating ***  CV Studies: Cardiac studies reviewed are outlined and summarized above. Otherwise please see EMR for full report.  Recent Labs: No results found for requested labs within last 365 days.  Recent Lipid Panel    Component Value  Date/Time   CHOL 187 02/01/2022 0950   TRIG 163 (H) 02/01/2022 0950   HDL 41 02/01/2022 0950   CHOLHDL 4.6 02/01/2022 0950   CHOLHDL 4.2 10/13/2020 0854   VLDL 29.0 06/15/2019 0809   LDLCALC 117 (H) 02/01/2022 0950   LDLCALC 106 (H) 10/13/2020 0854   LDLDIRECT 117.0 08/12/2017 0921    PHYSICAL EXAM:    VS:  There were no vitals taken for this visit.  BMI: There is no height or weight on file to calculate BMI.  GEN: Well nourished, well developed male in no acute distress HEENT: normocephalic, atraumatic Neck: no JVD, carotid bruits, or masses Cardiac: ***RRR; no murmurs, rubs, or gallops, no edema  Respiratory:  clear to auscultation bilaterally, normal work of breathing GI: soft, nontender, nondistended, + BS MS: no deformity or atrophy Skin: warm and dry, no rash Neuro:  Alert and Oriented x 3, Strength and sensation are intact, follows commands Psych: euthymic mood, full affect  Wt Readings from Last 3 Encounters:  05/06/23 190 lb 8 oz (86.4 kg)  02/10/23 188 lb 3.2 oz (85.4 kg)  12/13/22 190 lb (86.2 kg)     ASSESSMENT & PLAN:   ***     Disposition: F/u with ***   Medication Adjustments/Labs and Tests Ordered: Current medicines are  reviewed at length with the patient today.  Concerns regarding medicines are outlined above. Medication changes, Labs and Tests ordered today are summarized above and listed in the Patient Instructions accessible in Encounters.   Signed, Laurann Montana, PA-C  05/24/2023 7:12 PM    [facility] Phone: [phone]; Fax: [phone]

## 2023-05-25 ENCOUNTER — Encounter: Payer: Self-pay | Admitting: Cardiology

## 2023-05-25 ENCOUNTER — Ambulatory Visit: Payer: Medicare Other | Attending: Physician Assistant | Admitting: Cardiology

## 2023-05-25 ENCOUNTER — Ambulatory Visit (INDEPENDENT_AMBULATORY_CARE_PROVIDER_SITE_OTHER): Payer: Medicare Other

## 2023-05-25 VITALS — BP 118/74 | HR 82 | Ht 70.0 in | Wt 188.8 lb

## 2023-05-25 DIAGNOSIS — I4892 Unspecified atrial flutter: Secondary | ICD-10-CM

## 2023-05-25 DIAGNOSIS — I2510 Atherosclerotic heart disease of native coronary artery without angina pectoris: Secondary | ICD-10-CM | POA: Diagnosis not present

## 2023-05-25 DIAGNOSIS — I77819 Aortic ectasia, unspecified site: Secondary | ICD-10-CM | POA: Insufficient documentation

## 2023-05-25 DIAGNOSIS — I499 Cardiac arrhythmia, unspecified: Secondary | ICD-10-CM | POA: Insufficient documentation

## 2023-05-25 DIAGNOSIS — R6889 Other general symptoms and signs: Secondary | ICD-10-CM | POA: Insufficient documentation

## 2023-05-25 DIAGNOSIS — R030 Elevated blood-pressure reading, without diagnosis of hypertension: Secondary | ICD-10-CM | POA: Diagnosis not present

## 2023-05-25 DIAGNOSIS — I779 Disorder of arteries and arterioles, unspecified: Secondary | ICD-10-CM | POA: Diagnosis not present

## 2023-05-25 DIAGNOSIS — E7800 Pure hypercholesterolemia, unspecified: Secondary | ICD-10-CM | POA: Insufficient documentation

## 2023-05-25 MED ORDER — METOPROLOL SUCCINATE ER 25 MG PO TB24: 12.5000 mg | ORAL_TABLET | Freq: Every day | ORAL | 3 refills | Status: DC

## 2023-05-25 NOTE — Patient Instructions (Addendum)
Medication Instructions:  Your physician has recommended you make the following change in your medication:   1) START metoprolol succinate (Toprol XL) 12.5mg  daily  *If you need a refill on your cardiac medications before your next appointment, please call your pharmacy*  Lab Work: TODAY: TSH, Free T4, BMP, magnesium, Lipid If you have labs (blood work) drawn today and your tests are completely normal, you will receive your results only by: MyChart Message (if you have MyChart) OR A paper copy in the mail If you have any lab test that is abnormal or we need to change your treatment, we will call you to review the results.  Testing/Procedures: Your physician has requested that you wear a Zio heart monitor for 14 days. This will be mailed to your home with instructions on how to apply the monitor and how to return it when finished. Please allow 2 weeks after returning the heart monitor before our office calls you with the results.   Your physician has requested that you have an echocardiogram. Echocardiography is a painless test that uses sound waves to create images of your heart. It provides your doctor with information about the size and shape of your heart and how well your heart's chambers and valves are working. This procedure takes approximately one hour. There are no restrictions for this procedure. Please do NOT wear cologne, perfume, aftershave, or lotions (deodorant is allowed). Please arrive 15 minutes prior to your appointment time.  Follow-Up: At [facility], you and your health needs are our priority.  As part of our continuing mission to provide you with exceptional heart care, we have created designated Provider Care Teams.  These Care Teams include your primary Cardiologist (physician) and Advanced Practice Providers (APPs -  Physician Assistants and Nurse Practitioners) who all work together to provide you with the care you need, when you need it.  Your next appointment:    7-8 month(s) (December 2024)  The format for your next appointment:   In Person  Provider:   Tonny Bollman, MD {  Other Instructions ZIO XT- Long Term Monitor Instructions     Your physician has requested you wear a ZIO patch monitor for 14 days.  This is a single patch monitor. [employer] supplies one patch monitor per enrollment. Additional  stickers are not available. Please do not apply patch if you will be having a Nuclear Stress Test,  Echocardiogram, Cardiac CT, MRI, or Chest Xray during the period you would be wearing the  monitor. The patch cannot be worn during these tests. You cannot remove and re-apply the  ZIO XT patch monitor.  Your ZIO patch monitor will be mailed 3 day USPS to your address on file. It may take 3-5 days  to receive your monitor after you have been enrolled.  Once you have received your monitor, please review the enclosed instructions. Your monitor  has already been registered assigning a specific monitor serial # to you.     Billing and Patient Assistance Program Information     We have supplied [employer] with any of your insurance information on file for billing purposes.  [employer] offers a sliding scale Patient Assistance Program for patients that do not have  insurance, or whose insurance does not completely cover the cost of the ZIO monitor.  You must apply for the Patient Assistance Program to qualify for this discounted rate.  To apply, please call [employer] at [phone], select option 4, select option 2, ask to apply for  Patient  Assistance Program. Meredeth Ide will ask your household income, and how many people  are in your household. They will quote your out-of-pocket cost based on that information.  [employer] will also be able to set up a 26-month, interest-free payment plan if needed.     Applying the monitor     Shave hair from upper left chest.  Hold abrader disc by orange tab. Rub abrader in 40 strokes over the upper left chest as  indicated  in your monitor instructions.  Clean area with 4 enclosed alcohol pads. Let dry.  Apply patch as indicated in monitor instructions. Patch will be placed under collarbone on left  side of chest with arrow pointing upward.  Rub patch adhesive wings for 2 minutes. Remove white label marked "1". Remove the white  label marked "2". Rub patch adhesive wings for 2 additional minutes.  While looking in a mirror, press and release button in center of patch. A small green light will  flash 3-4 times. This will be your only indicator that the monitor has been turned on.  Do not shower for the first 24 hours. You may shower after the first 24 hours.  Press the button if you feel a symptom. You will hear a small click. Record Date, Time and  Symptom in the Patient Logbook.  When you are ready to remove the patch, follow instructions on the last 2 pages of Patient  Logbook. Stick patch monitor onto the last page of Patient Logbook.  Place Patient Logbook in the blue and white box. Use locking tab on box and tape box closed  securely. The blue and white box has prepaid postage on it. Please place it in the mailbox as  soon as possible. Your physician should have your test results approximately 7 days after the  monitor has been mailed back to [facility].  Call [facility] Customer Care at [phone] if you have questions regarding  your ZIO XT patch monitor. Call them immediately if you see an orange light blinking on your  monitor.  If your monitor falls off in less than 4 days, contact our Monitor department at [phone].  If your monitor becomes loose or falls off after 4 days call [employer] at [phone] for  suggestions on securing your monitor.

## 2023-05-25 NOTE — Progress Notes (Unsigned)
Enrolled for [employer] to mail a ZIO XT long term holter monitor to the patients address on file.   Dr. Cooper to read. 

## 2023-05-26 LAB — BASIC METABOLIC PANEL
BUN/Creatinine Ratio: 16 (ref 10–24)
BUN: 22 mg/dL (ref 8–27)
CO2: 24 mmol/L (ref 20–29)
Calcium: 9.8 mg/dL (ref 8.6–10.2)
Chloride: 103 mmol/L (ref 96–106)
Creatinine, Ser: 1.41 mg/dL — ABNORMAL HIGH (ref 0.76–1.27)
Glucose: 97 mg/dL (ref 70–99)
Potassium: 4.5 mmol/L (ref 3.5–5.2)
Sodium: 140 mmol/L (ref 134–144)
eGFR: 52 mL/min/{1.73_m2} — ABNORMAL LOW (ref >59)

## 2023-05-26 LAB — LIPID PANEL
Chol/HDL Ratio: 4.1 ratio (ref 0.0–5.0)
Cholesterol, Total: 196 mg/dL (ref 100–199)
HDL: 48 mg/dL (ref >39)
LDL Chol Calc (NIH): 122 mg/dL — ABNORMAL HIGH (ref 0–99)
Triglycerides: 147 mg/dL (ref 0–149)
VLDL Cholesterol Cal: 26 mg/dL (ref 5–40)

## 2023-05-26 LAB — MAGNESIUM: Magnesium: 2.3 mg/dL (ref 1.6–2.3)

## 2023-05-26 LAB — T4, FREE: Free T4: 1.24 ng/dL (ref 0.82–1.77)

## 2023-05-26 LAB — TSH: TSH: 1.98 u[IU]/mL (ref 0.450–4.500)

## 2023-05-27 DIAGNOSIS — I4892 Unspecified atrial flutter: Secondary | ICD-10-CM

## 2023-05-30 ENCOUNTER — Ambulatory Visit: Payer: Medicare Other | Admitting: Physician Assistant

## 2023-06-03 ENCOUNTER — Ambulatory Visit: Payer: Medicare Other | Admitting: Family Medicine

## 2023-06-06 ENCOUNTER — Encounter: Payer: Self-pay | Admitting: Family Medicine

## 2023-06-06 ENCOUNTER — Ambulatory Visit (INDEPENDENT_AMBULATORY_CARE_PROVIDER_SITE_OTHER): Payer: Medicare Other | Admitting: Family Medicine

## 2023-06-06 VITALS — BP 120/78 | HR 73 | Temp 97.7°F | Ht 70.0 in | Wt 188.0 lb

## 2023-06-06 DIAGNOSIS — B353 Tinea pedis: Secondary | ICD-10-CM

## 2023-06-06 DIAGNOSIS — M19071 Primary osteoarthritis, right ankle and foot: Secondary | ICD-10-CM | POA: Diagnosis not present

## 2023-06-06 MED ORDER — KETOCONAZOLE 2 % EX CREA
1.0000 | TOPICAL_CREAM | Freq: Two times a day (BID) | CUTANEOUS | 0 refills | Status: AC
Start: 2023-06-06 — End: ?

## 2023-06-06 NOTE — Progress Notes (Signed)
Established Patient Office Visit  Subjective   Patient ID: Ronnie Vazquez, male    DOB: [birth year]  Age: 77 y.o. MRN: [id number]  Chief Complaint  Patient presents with   Anxiety    Patient complains of increased anxiety of not getting better from back problems for the past few months (due to fall from December 2023), states he has had a number of cortisone injections in his back with the last one (4th) providing relief   Toe Pain    Right great toe pain x6 days, seen by a specialist 3 years ago, diagnosed with arthritis    Patient is here to discuss changes. States that he has had multiple steroid injections for his lumbar radiculopathy, states that the shots didn't work until the fourth shot he had last month. Patient states that since he got the injection in May he has been experiencing hot flushing, palpitations and some anxiety. He has been checking his blood pressure regularly at home and they are in the normal range. He reports that he had a significant amount of anxiety surrounding his back pain and the amount of steroids he has received.   Patient is reporting a history of right toe pain that has been going on for years, states that he saw a podiatrist in the past and he was told it was arthritis, states that he wears an orthotic in his shoe and he takes advil and voltaren. He had a uric acid level checked in 2021 and it was normal. He had questions about taking NSAIDS with a GFR of 52. We discussed the risks/benefits of using NSAIDS in the setting of mild CKD  He saw the cardiologist and is having a cardiac work up for the palpitations, had a holter monitor and will be getting an ECHO at the end of the month. Had a normal stress test in December 2023.     Current Outpatient Medications  Medication Instructions   aspirin EC 81 mg, Oral, Only takes occasionally Swallow whole.   clotrimazole (ANTIFUNGAL, CLOTRIMAZOLE,) 1 % cream 1 application , Topical, 2 times daily   ezetimibe  (ZETIA) 10 mg, Oral, Daily   ketoconazole (NIZORAL) 2 % cream 1 Application, Topical, 2 times daily   methocarbamol (ROBAXIN) 500 MG tablet TAKE ONE TABLET BY MOUTH EVERY 6 HOURS AS NEEDED FOR MUSCLE SPASMS   metoprolol succinate (TOPROL XL) 12.5 mg, Oral, Daily   nitroGLYCERIN (NITROSTAT) 0.4 mg, Sublingual, Every 5 min PRN   pantoprazole (PROTONIX) 40 MG tablet TAKE 1 TABLET BY MOUTH DAILY    Patient Active Problem List   Diagnosis Date Noted   Osteoarthritis of toe joint, right 06/06/2023   Hemorrhoids 06/04/2021   Gastritis without bleeding 06/04/2021   Esophageal dysphagia    Typical atrial flutter (HCC) 09/06/2020   B12 deficiency 11/14/2019   Gastroesophageal reflux disease 11/28/2018   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     Objective:     BP 120/78 (BP Location: Left Arm, Patient Position: Sitting, Cuff Size: Normal)   Pulse 73   Temp 97.7 F (36.5 C) (Oral)   Ht 5\' 10"  (1.778 m)   Wt 188 lb (85.3 kg)   SpO2 98%   BMI 26.98 kg/m    Physical Exam Vitals  reviewed.  Constitutional:      Appearance: Normal appearance. He is well-groomed and normal weight.  Cardiovascular:     Rate and Rhythm: Normal rate and regular rhythm.     Pulses:          Dorsalis pedis pulses are 2+ on the right side and 2+ on the left side.     Heart sounds: S1 normal and S2 normal. No murmur heard. Pulmonary:     Effort: Pulmonary effort is normal.     Breath sounds: Normal breath sounds and air entry. No rales.  Musculoskeletal:     Right lower leg: No edema.     Left lower leg: No edema.  Feet:     Right foot:     Skin integrity: Fissure present.     Left foot:     Skin integrity: Fissure present.     Comments: There is  flakiness in between the 4th and 5th digits on both feet. Neurological:     General: No focal deficit present.     Mental Status: He is alert and oriented to person, place, and time.     Gait: Gait is intact.  Psychiatric:        Mood and Affect: Mood and affect normal.      No results found for any visits on 06/06/23.    The 10-year ASCVD risk score (Arnett DK, et al., 2019) is: 21.2%    Assessment & Plan:  Osteoarthritis of toe joint, right Uric acid in the past was negative, exam today does not show signs of gout, I encouraged the patient to continue the NSAIDS as needed and we will monitor his kidney function more often while he is taking these medications.   Tinea pedis of both feet -     Ketoconazole; Apply 1 Application topically 2 (two) times daily.  Dispense: 60 g; Refill: 0 Will treat the abnormal skin on his feet with antifungal cream-- if it does not improve then I would send back to podiatry.     Return in about 6 months (around 12/06/2023) for follow up-- recheck kidney function.    Karie Georges, MD

## 2023-06-07 ENCOUNTER — Other Ambulatory Visit: Payer: Self-pay

## 2023-06-07 MED ORDER — NITROGLYCERIN 0.4 MG SL SUBL: 0.4000 mg | SUBLINGUAL_TABLET | SUBLINGUAL | 6 refills | Status: AC | PRN

## 2023-06-08 DIAGNOSIS — I4892 Unspecified atrial flutter: Secondary | ICD-10-CM | POA: Diagnosis not present

## 2023-06-09 ENCOUNTER — Encounter: Payer: Self-pay | Admitting: Family Medicine

## 2023-06-09 DIAGNOSIS — M19071 Primary osteoarthritis, right ankle and foot: Secondary | ICD-10-CM

## 2023-06-09 DIAGNOSIS — Z6826 Body mass index (BMI) 26.0-26.9, adult: Secondary | ICD-10-CM | POA: Diagnosis not present

## 2023-06-09 DIAGNOSIS — M5416 Radiculopathy, lumbar region: Secondary | ICD-10-CM | POA: Diagnosis not present

## 2023-06-13 ENCOUNTER — Other Ambulatory Visit (INDEPENDENT_AMBULATORY_CARE_PROVIDER_SITE_OTHER): Payer: Medicare Other

## 2023-06-13 ENCOUNTER — Encounter: Payer: Self-pay | Admitting: Family Medicine

## 2023-06-13 DIAGNOSIS — M19071 Primary osteoarthritis, right ankle and foot: Secondary | ICD-10-CM

## 2023-06-13 LAB — URIC ACID: Uric Acid, Serum: 5.4 mg/dL (ref 4.0–7.8)

## 2023-06-15 ENCOUNTER — Encounter: Payer: Self-pay | Admitting: Family Medicine

## 2023-06-15 DIAGNOSIS — M25551 Pain in right hip: Secondary | ICD-10-CM

## 2023-06-15 DIAGNOSIS — M19071 Primary osteoarthritis, right ankle and foot: Secondary | ICD-10-CM

## 2023-06-17 ENCOUNTER — Telehealth: Payer: Self-pay | Admitting: Cardiovascular Disease

## 2023-06-17 NOTE — Telephone Encounter (Signed)
Patient states he is returning a call received yesterday from [place].

## 2023-06-17 NOTE — Telephone Encounter (Signed)
Returned call and spoke with patient. Informed him I cannot see any documentation of Ronnie Vazquez (or anyone else) attempting to contact him recently.   Patient has heart monitor results waiting to be reviewed by provider, but no notes from nurse attempting to contact him.  Patient states she called around 4:15pm yesterday and said she was Ronnie Vazquez from Dr. Hoyle Barr office and to  call back at our number.  Informed patient I am not sure what the call was pertaining to and Ronnie Vazquez is not in the office today. I will forward message to [facility] and if she needs to speak with patient she will follow-up when she returns.  Patient verbalized understanding and expressed appreciation for callback.

## 2023-06-20 DIAGNOSIS — M25871 Other specified joint disorders, right ankle and foot: Secondary | ICD-10-CM | POA: Diagnosis not present

## 2023-06-22 NOTE — Telephone Encounter (Signed)
I spoke with patient and we determined call was for his wife.

## 2023-06-24 ENCOUNTER — Ambulatory Visit (HOSPITAL_COMMUNITY): Payer: Medicare Other | Attending: Cardiology

## 2023-06-24 ENCOUNTER — Telehealth: Payer: Self-pay | Admitting: Gastroenterology

## 2023-06-24 DIAGNOSIS — I4892 Unspecified atrial flutter: Secondary | ICD-10-CM | POA: Diagnosis not present

## 2023-06-24 LAB — ECHOCARDIOGRAM COMPLETE
Area-P 1/2: 2.84 cm2
S' Lateral: 3 cm

## 2023-06-24 MED ORDER — PANTOPRAZOLE SODIUM 40 MG PO TBEC: DELAYED_RELEASE_TABLET | ORAL | 1 refills | Status: DC

## 2023-06-24 NOTE — Telephone Encounter (Signed)
Patient is calling requesting a refill for Pantoprazole.Ronnie KitchenHe is schedule to f/u 9/11.Please advise

## 2023-06-24 NOTE — Telephone Encounter (Signed)
Refill sent to pharmacy.   

## 2023-06-28 DIAGNOSIS — M5416 Radiculopathy, lumbar region: Secondary | ICD-10-CM | POA: Diagnosis not present

## 2023-06-28 DIAGNOSIS — Z6826 Body mass index (BMI) 26.0-26.9, adult: Secondary | ICD-10-CM | POA: Diagnosis not present

## 2023-07-05 ENCOUNTER — Telehealth: Payer: Self-pay | Admitting: Cardiovascular Disease

## 2023-07-05 NOTE — Telephone Encounter (Signed)
Follow Up::      Patient is returning a call from yesterday, concerning his Monitor results.

## 2023-07-08 NOTE — Telephone Encounter (Signed)
AIRIK GUILBERT, PA-C 07/03/2023 12:42 PM EDT     Please tell patient that his monitor showed normal sinus rhythm with an average HR of 69 BPM. No atrial fibrillation or atrial flutter detected, which is great news! There were no sustained arrhythmias. Overall, monitor was reassuring. He should continue his current medications and follow up as arranged Thanks KJ   Returned call to patient and provided above results. No further questions.

## 2023-07-20 DIAGNOSIS — H52221 Regular astigmatism, right eye: Secondary | ICD-10-CM | POA: Diagnosis not present

## 2023-07-20 DIAGNOSIS — H43811 Vitreous degeneration, right eye: Secondary | ICD-10-CM | POA: Diagnosis not present

## 2023-07-20 DIAGNOSIS — H5211 Myopia, right eye: Secondary | ICD-10-CM | POA: Diagnosis not present

## 2023-07-20 DIAGNOSIS — H43391 Other vitreous opacities, right eye: Secondary | ICD-10-CM | POA: Diagnosis not present

## 2023-08-23 DIAGNOSIS — M5416 Radiculopathy, lumbar region: Secondary | ICD-10-CM | POA: Diagnosis not present

## 2023-08-23 DIAGNOSIS — M48061 Spinal stenosis, lumbar region without neurogenic claudication: Secondary | ICD-10-CM | POA: Diagnosis not present

## 2023-09-01 ENCOUNTER — Other Ambulatory Visit: Payer: Self-pay | Admitting: Cardiovascular Disease

## 2023-09-02 DIAGNOSIS — M48061 Spinal stenosis, lumbar region without neurogenic claudication: Secondary | ICD-10-CM | POA: Diagnosis not present

## 2023-09-02 DIAGNOSIS — M5416 Radiculopathy, lumbar region: Secondary | ICD-10-CM | POA: Diagnosis not present

## 2023-09-07 ENCOUNTER — Ambulatory Visit: Payer: Medicare Other | Admitting: Gastroenterology

## 2023-09-07 DIAGNOSIS — M48061 Spinal stenosis, lumbar region without neurogenic claudication: Secondary | ICD-10-CM | POA: Diagnosis not present

## 2023-09-07 DIAGNOSIS — M5416 Radiculopathy, lumbar region: Secondary | ICD-10-CM | POA: Diagnosis not present

## 2023-09-09 DIAGNOSIS — M48061 Spinal stenosis, lumbar region without neurogenic claudication: Secondary | ICD-10-CM | POA: Diagnosis not present

## 2023-09-09 DIAGNOSIS — M5416 Radiculopathy, lumbar region: Secondary | ICD-10-CM | POA: Diagnosis not present

## 2023-09-13 DIAGNOSIS — M5416 Radiculopathy, lumbar region: Secondary | ICD-10-CM | POA: Diagnosis not present

## 2023-09-13 DIAGNOSIS — M48061 Spinal stenosis, lumbar region without neurogenic claudication: Secondary | ICD-10-CM | POA: Diagnosis not present

## 2023-09-21 DIAGNOSIS — M5416 Radiculopathy, lumbar region: Secondary | ICD-10-CM | POA: Diagnosis not present

## 2023-09-21 DIAGNOSIS — M48061 Spinal stenosis, lumbar region without neurogenic claudication: Secondary | ICD-10-CM | POA: Diagnosis not present

## 2023-09-22 DIAGNOSIS — H52221 Regular astigmatism, right eye: Secondary | ICD-10-CM | POA: Diagnosis not present

## 2023-09-22 DIAGNOSIS — H2513 Age-related nuclear cataract, bilateral: Secondary | ICD-10-CM | POA: Diagnosis not present

## 2023-09-22 DIAGNOSIS — H53143 Visual discomfort, bilateral: Secondary | ICD-10-CM | POA: Diagnosis not present

## 2023-09-22 DIAGNOSIS — H53002 Unspecified amblyopia, left eye: Secondary | ICD-10-CM | POA: Diagnosis not present

## 2023-09-22 DIAGNOSIS — H5211 Myopia, right eye: Secondary | ICD-10-CM | POA: Diagnosis not present

## 2023-09-27 ENCOUNTER — Telehealth: Payer: Self-pay | Admitting: Family Medicine

## 2023-09-27 NOTE — Telephone Encounter (Signed)
Spoke with the patient and informed him a visit is needed for evaluation and scheduled a visit with Kandee Keen on 10/2.  Patient stated he has had numbness bilateral lower extremities for the past 3-4 months, burning sensation for the past 2 weeks, has been evaluated by Dr Yetta Barre (neuro) who once told him back surgery may be needed for these symptoms, but not at the time of the visit.  Stated he has an upcoming appt on 10/10 with the neurologist, I advised and the patient agreed to call Dr Barnett Applebaum office back to check for cancellations instead.  Appt on 10/2 was cancelled per patient's request.

## 2023-09-27 NOTE — Telephone Encounter (Signed)
Pt called to report:  Numbness in feet & calf - Fatigue in Muscles  -  Should he have blood work done? Wants to know if he has gout? Will see Neurologist soon. Would like some advice before he goes. Pt was offered an OV, but MD does not have any availability until 10/21/23. Pt did not want to see another provider.  Please call Pt back to discuss.

## 2023-09-28 ENCOUNTER — Ambulatory Visit: Payer: Medicare Other | Admitting: Adult Health

## 2023-10-03 DIAGNOSIS — H9313 Tinnitus, bilateral: Secondary | ICD-10-CM | POA: Diagnosis not present

## 2023-10-05 DIAGNOSIS — L821 Other seborrheic keratosis: Secondary | ICD-10-CM | POA: Diagnosis not present

## 2023-10-05 DIAGNOSIS — L578 Other skin changes due to chronic exposure to nonionizing radiation: Secondary | ICD-10-CM | POA: Diagnosis not present

## 2023-10-05 DIAGNOSIS — L814 Other melanin hyperpigmentation: Secondary | ICD-10-CM | POA: Diagnosis not present

## 2023-10-05 DIAGNOSIS — D225 Melanocytic nevi of trunk: Secondary | ICD-10-CM | POA: Diagnosis not present

## 2023-10-05 DIAGNOSIS — L570 Actinic keratosis: Secondary | ICD-10-CM | POA: Diagnosis not present

## 2023-10-05 DIAGNOSIS — B353 Tinea pedis: Secondary | ICD-10-CM | POA: Diagnosis not present

## 2023-10-06 DIAGNOSIS — R200 Anesthesia of skin: Secondary | ICD-10-CM | POA: Diagnosis not present

## 2023-10-12 ENCOUNTER — Ambulatory Visit (INDEPENDENT_AMBULATORY_CARE_PROVIDER_SITE_OTHER): Payer: Medicare Other | Admitting: Family Medicine

## 2023-10-12 ENCOUNTER — Encounter: Payer: Self-pay | Admitting: Family Medicine

## 2023-10-12 VITALS — BP 100/70 | HR 75 | Temp 97.5°F | Ht 70.0 in | Wt 186.5 lb

## 2023-10-12 DIAGNOSIS — R202 Paresthesia of skin: Secondary | ICD-10-CM | POA: Diagnosis not present

## 2023-10-12 DIAGNOSIS — R739 Hyperglycemia, unspecified: Secondary | ICD-10-CM

## 2023-10-12 DIAGNOSIS — G629 Polyneuropathy, unspecified: Secondary | ICD-10-CM

## 2023-10-12 LAB — BASIC METABOLIC PANEL
BUN: 28 mg/dL — ABNORMAL HIGH (ref 6–23)
CO2: 27 meq/L (ref 19–32)
Calcium: 9.8 mg/dL (ref 8.4–10.5)
Chloride: 103 meq/L (ref 96–112)
Creatinine, Ser: 1.36 mg/dL (ref 0.40–1.50)
GFR: 51.13 mL/min — ABNORMAL LOW (ref >60.00)
Glucose, Bld: 85 mg/dL (ref 70–99)
Potassium: 4.5 meq/L (ref 3.5–5.1)
Sodium: 139 meq/L (ref 135–145)

## 2023-10-12 LAB — VITAMIN B12: Vitamin B-12: >1501 pg/mL — ABNORMAL HIGH (ref 211–911)

## 2023-10-12 LAB — HEMOGLOBIN A1C: Hgb A1c MFr Bld: 5.8 % (ref 4.6–6.5)

## 2023-10-12 NOTE — Progress Notes (Signed)
Established Patient Office Visit  Subjective   Patient ID: Ronnie Vazquez, male    DOB: January 09, 1946  Age: 77 y.o. MRN: [id number]  Chief Complaint  Patient presents with   Numbness    Patient complains of feet numbness, x3 months     HPI   [name] is seen today as a work in with some progressive bilateral foot numbness and paresthesias over the past few months.  He states last December he had a fall and developed some pain around the hip and back region.  Went to orthopedics.  They felt like his pain was originating more from his spine.  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  He had what sounds like multiple epidurals which eventually helped.  Now presents with symmetric bilateral foot numbness and burning dysesthesias which have progressed somewhat toward the calf.  No history of diabetes but has had some mildly elevated glucoses previously.  He apparently has been scheduled for nerve conduction velocities per neurosurgery still waiting on that.  He has past history of atrial fibrillation with previous ablation and he avoids alcohol pretty much all together with very rare minimal wine consumption.  Denies any recent appetite or weight changes.  No fevers, chills, or night sweats.  Denies any weakness.  He send thyroid function is normal.  Does use chronic PPI with pantoprazole 40 mg daily.  Previous B12 level years ago was low at 215.  Does take oral B12 daily.  Past Medical History:  Diagnosis Date   Arthritis    bilateral feet   Atrial flutter (HCC)    Echo 9/21: EF 60-65, no RWMA, normal RVSF, trivial MR, borderline dilation of aorta at sinus of Valsalva (37 mm), mild LAE, RVSP 26.8   Chicken pox    hx   Coronary artery disease    08/29/18 PCI/DES with bifurcation from LAD into 1st diag.    GERD (gastroesophageal reflux disease)    with certain foods/on OTC meds   Hyperlipidemia    on meds   Left shoulder pain  02/02/2013   Microscopic hematuria 02/02/2013   Vision loss of left eye    Past Surgical History:  Procedure Laterality Date   A-FLUTTER ABLATION N/A 10/24/2020   Procedure: A-FLUTTER ABLATION;  Surgeon: Duke Salvia, MD;  Location: [facility] INVASIVE CV LAB;  Service: Cardiovascular;  Laterality: N/A;   COLONOSCOPY  10/2017   MS-suprep(good)-SSA/TICS/hems/TA's-3 yr recall   CORONARY BALLOON ANGIOPLASTY N/A 08/29/2018   Procedure: CORONARY BALLOON ANGIOPLASTY;  Surgeon: Tonny Bollman, MD;  Location: [facility] INVASIVE CV LAB;  Service: Cardiovascular;  Laterality: N/A;   CORONARY STENT INTERVENTION N/A 08/29/2018   Procedure: CORONARY STENT INTERVENTION;  Surgeon: Tonny Bollman, MD;  Location: [facility] INVASIVE CV LAB;  Service: Cardiovascular;  Laterality: N/A;   ESOPHAGEAL MANOMETRY N/A 10/08/2020   Procedure: ESOPHAGEAL MANOMETRY (EM);  Surgeon: Meryl Dare, MD;  Location: [facility] ENDOSCOPY;  Service: Endoscopy;  Laterality: N/A;   LEFT HEART CATH AND CORONARY ANGIOGRAPHY N/A 08/08/2018   Procedure: LEFT HEART CATH AND CORONARY ANGIOGRAPHY;  Surgeon: Runell Gess, MD;  Location: [facility] INVASIVE CV LAB;  Service: Cardiovascular;  Laterality: N/A;   POLYPECTOMY  10/2017   SSA/TA's   TONSILLECTOMY AND ADENOIDECTOMY  1959   WISDOM TOOTH EXTRACTION  1975    reports that he has never smoked. He has never used smokeless tobacco. He reports current alcohol use of about 1.0 standard drink of alcohol per week.  He reports that he does not use drugs. family history includes Arthritis in his mother; Arthritis/Rheumatoid in his brother; Prostate cancer in his father. Allergies  Allergen Reactions   Repatha [Evolocumab] Other (See Comments)    Myalgias   Bempedoic Acid Other (See Comments)    (Nexletol) muscle pain.   Crestor [Rosuvastatin Calcium] Other (See Comments)    Myalgias (muscle/joint pain)   Statins     Other reaction(s): Other (See Comments), Other (See Comments) Myalgias (muscle/joint pain) Muscle  aches Pt stated "goes to my muscles"     Review of Systems  Constitutional:  Negative for malaise/fatigue.  Eyes:  Negative for blurred vision.  Respiratory:  Negative for shortness of breath.   Cardiovascular:  Negative for chest pain.  Gastrointestinal:  Negative for abdominal pain.  Neurological:  Positive for sensory change. Negative for dizziness, weakness and headaches.      Objective:     BP 100/70 (BP Location: Left Arm, Patient Position: Sitting, Cuff Size: Normal)   Pulse 75   Temp (!) 97.5 F (36.4 C) (Oral)   Ht 5\' 10"  (1.778 m)   Wt 186 lb 8 oz (84.6 kg)   SpO2 98%   BMI 26.76 kg/m  BP Readings from Last 3 Encounters:  10/12/23 100/70  06/06/23 120/78  05/25/23 118/74   Wt Readings from Last 3 Encounters:  10/12/23 186 lb 8 oz (84.6 kg)  06/06/23 188 lb (85.3 kg)  05/25/23 188 lb 12.8 oz (85.6 kg)      Physical Exam Vitals reviewed.  Constitutional:      General: He is not in acute distress.    Appearance: Normal appearance. He is not ill-appearing.  Cardiovascular:     Rate and Rhythm: Normal rate and regular rhythm.  Pulmonary:     Effort: Pulmonary effort is normal.     Breath sounds: Normal breath sounds.  Musculoskeletal:     Right lower leg: No edema.     Left lower leg: No edema.  Neurological:     Mental Status: He is alert.     Comments: He has full strength with plantarflexion, dorsiflexion, knee extension bilaterally.  Intact sensation with monofilament testing in both feet throughout.  Subjective reduction in sensation to touch.  Romberg is normal.  Deep tendon reflexes 2+ ankle and knee bilaterally      No results found for any visits on 10/12/23.  Last CBC Lab Results  Component Value Date   WBC 5.9 02/01/2022   HGB 14.5 02/01/2022   HCT 42.8 02/01/2022   MCV 96 02/01/2022   MCH 32.6 02/01/2022   RDW 12.5 02/01/2022   PLT 188 02/01/2022   Last metabolic panel Lab Results  Component Value Date   GLUCOSE 97  05/25/2023   NA 140 05/25/2023   K 4.5 05/25/2023   CL 103 05/25/2023   CO2 24 05/25/2023   BUN 22 05/25/2023   CREATININE 1.41 (H) 05/25/2023   EGFR 52 (L) 05/25/2023   CALCIUM 9.8 05/25/2023   PROT 6.4 05/04/2021   ALBUMIN 4.4 05/04/2021   LABGLOB 2.6 10/15/2019   AGRATIO 1.7 10/15/2019   BILITOT 0.4 05/04/2021   ALKPHOS 53 05/04/2021   AST 19 05/04/2021   ALT 18 05/04/2021   ANIONGAP 10 10/06/2018   Last thyroid functions Lab Results  Component Value Date   TSH 1.980 05/25/2023   Last vitamin B12 and Folate Lab Results  Component Value Date   VITAMINB12 462 10/13/2020   FOLATE 15.0 02/20/2020  The 10-year ASCVD risk score (Arnett DK, et al., 2019) is: 15.9%    Assessment & Plan:   Problem List Items Addressed This Visit   None Visit Diagnoses     Peripheral polyneuropathy    -  Primary   Relevant Orders   Hemoglobin A1c   Vitamin B12   Multiple Myeloma Panel (SPEP&IFE w/QIG)   Basic metabolic panel   Paresthesia       Relevant Orders   Hemoglobin A1c   Vitamin B12   Multiple Myeloma Panel (SPEP&IFE w/QIG)   Hyperglycemia       Relevant Orders   Hemoglobin A1c     Patient presents with 75-month history of some paresthesias and intermittent burning dysesthesia bilateral feet spreading toward the calf.  Rare alcohol use.  Doubt diabetes.  Previous blood sugars have been only minimally elevated.  Doubt related to his spinal disease.  He does have nerve conduction velocities pending.  We suggested the following  -Check hemoglobin A1c, basic metabolic panel, B12 level (chronic PPI use), and multiple myeloma panel -Follow through with nerve conduction testing which has already been scheduled -Consider neurology consultation if above all normal and symptoms persist  No follow-ups on file.    Evelena Peat, MD

## 2023-10-13 ENCOUNTER — Telehealth: Payer: Self-pay | Admitting: Family Medicine

## 2023-10-13 DIAGNOSIS — M5416 Radiculopathy, lumbar region: Secondary | ICD-10-CM | POA: Diagnosis not present

## 2023-10-13 NOTE — Telephone Encounter (Signed)
Pt has viewed his blood work results on [employer] and has questions

## 2023-10-14 NOTE — Telephone Encounter (Signed)
Patient had questions regarding B-12 and is question has been answered per message below

## 2023-10-14 NOTE — Telephone Encounter (Signed)
Left a message for the patient to return my call.  °

## 2023-10-14 NOTE — Telephone Encounter (Signed)
Pt returned call

## 2023-10-14 NOTE — Telephone Encounter (Signed)
Patient informed of the message below and voiced understanding  

## 2023-10-19 ENCOUNTER — Encounter: Payer: Self-pay | Admitting: Family Medicine

## 2023-10-19 LAB — MULTIPLE MYELOMA PANEL, SERUM
Albumin SerPl Elph-Mcnc: 3.9 g/dL (ref 2.9–4.4)
Albumin/Glob SerPl: 1.5 (ref 0.7–1.7)
Alpha 1: 0.2 g/dL (ref 0.0–0.4)
Alpha2 Glob SerPl Elph-Mcnc: 0.8 g/dL (ref 0.4–1.0)
B-Globulin SerPl Elph-Mcnc: 0.8 g/dL (ref 0.7–1.3)
Gamma Glob SerPl Elph-Mcnc: 0.9 g/dL (ref 0.4–1.8)
Globulin, Total: 2.7 g/dL (ref 2.2–3.9)
IgA/Immunoglobulin A, Serum: 97 mg/dL (ref 61–437)
IgG (Immunoglobin G), Serum: 864 mg/dL (ref 603–1613)
IgM (Immunoglobulin M), Srm: 141 mg/dL (ref 15–143)
Total Protein: 6.6 g/dL (ref 6.0–8.5)

## 2023-10-19 NOTE — Telephone Encounter (Signed)
Spoke with the patient to see what questions he has regarding recent lab testing.  Patient stated he was confused about the last test, was informed the multiple myeloma panel was negative and this was sent via Mychart message.  I reviewed all of the test results from 10/16 and the patient stated he will discuss further with PCP at upcoming appt.

## 2023-10-19 NOTE — Telephone Encounter (Signed)
Pt called with questions and concerns regarding recent blood work and would like a call back to discuss, at your earliest convenience.

## 2023-10-27 DIAGNOSIS — Z6827 Body mass index (BMI) 27.0-27.9, adult: Secondary | ICD-10-CM | POA: Diagnosis not present

## 2023-10-27 DIAGNOSIS — M5416 Radiculopathy, lumbar region: Secondary | ICD-10-CM | POA: Diagnosis not present

## 2023-11-16 NOTE — Progress Notes (Signed)
11/16/2023 Ronnie Vazquez [id number] 1946-12-18  Referring provider: Karie Georges, MD Primary GI doctor: Dr. Russella Dar  ASSESSMENT AND PLAN:   Gastroesophageal reflux disease EGD 2021 s/p dilation No dysphagia, no issues at this time.  Well controlled GERD on protonix 40 mg daily but he would like to cut back to 20 mg daily, can do trial of this with lifestyle changes Normal B12, magnesium.  Check Vitamin D  B12 deficiency Continue vitamin D  Bilateral carotid artery disease/CAD No chest pain/no SOB Continue exercise, continue medications   Patient Care Team: Karie Georges, MD as PCP - General (Family Medicine) Tonny Bollman, MD as PCP - Cardiology (Cardiology)  HISTORY OF PRESENT ILLNESS: 77 y.o. male with a past medical history of typical atrial flutter status post ablation, TIA, CAD status post PCI proximal LAD 2019, echo 05/2023 normal EF and no AS, carotid artery disease, GERD with history of dysphagia, B12 deficiency, personal history of adenomatous polyps and hemorrhoids and others listed below presents for evaluation of GERD and refill his pantoprazole.   08/05/2020 Barium swallow 1. Moderate gastroesophageal reflux elicited. No hiatal hernia. 2. Moderate esophageal dysmotility, with a chronic reflux related dysmotility pattern. 3. Suggestion of mild reflux esophagitis. No evidence of esophageal mass, stricture or ulcer. 08/2020 EGD for dysphagia- Dilation in the upper third of the esophagus and in the middle third of the esophagus. - No endoscopic esophageal abnormality to explain patient' s dysphagia. Esophagus dilated. Dilated. - Erosive gastropathy with no bleeding and no stigmata of recent bleeding. Biopsied. - Normal duodenal bulb and second portion of the duodenum. PATH benign 06/18/2021 colonoscopy Dr. Russella Dar for personal history of adenomatous polyps showed 3 polyps 6 to 9 mm rectum, descending, ascending colon, mild diverticulosis right and left  colon, external hemorrhoids, 2 polyps were adenomatous, recall 06/15/2024  He has been on pantoprazole daily since 2021 after hi EGD. He states his GERD is well controlled unless he has red wine or citrus foods. Denies dysphagia.  Will take advil once at night for the last year or will take tylenol PM, will take one or the other.  No AB pain, no melena.  He has been on prune juice for Bm's and doing well.  He is on a B12, his magnesium was normal.  He had a lot vitamin D.   He  reports that he has never smoked. He has never used smokeless tobacco. He reports current alcohol use of about 1.0 standard drink of alcohol per week. He reports that he does not use drugs.  RELEVANT LABS AND IMAGING:  CBC    Component Value Date/Time   WBC 5.9 02/01/2022 0950   WBC 5.6 10/13/2020 0854   RBC 4.45 02/01/2022 0950   RBC 4.76 10/13/2020 0854   HGB 14.5 02/01/2022 0950   HCT 42.8 02/01/2022 0950   PLT 188 02/01/2022 0950   MCV 96 02/01/2022 0950   MCH 32.6 02/01/2022 0950   MCH 33.0 10/13/2020 0854   MCHC 33.9 02/01/2022 0950   MCHC 34.1 10/13/2020 0854   RDW 12.5 02/01/2022 0950   LYMPHSABS 1,473 10/13/2020 0854   MONOABS 0.8 02/20/2020 0947   EOSABS 162 10/13/2020 0854   BASOSABS 62 10/13/2020 0854   No results for input(s): "HGB" in the last 8760 hours.  CMP     Component Value Date/Time   NA 139 10/12/2023 1333   NA 140 05/25/2023 0950   K 4.5 10/12/2023 1333   CL 103 10/12/2023 1333  CO2 27 10/12/2023 1333   GLUCOSE 85 10/12/2023 1333   BUN 28 (H) 10/12/2023 1333   BUN 22 05/25/2023 0950   CREATININE 1.36 10/12/2023 1333   CREATININE 1.43 (H) 10/13/2020 0854   CALCIUM 9.8 10/12/2023 1333   PROT 6.6 10/12/2023 1333   ALBUMIN 4.4 05/04/2021 0933   AST 19 05/04/2021 0933   ALT 18 05/04/2021 0933   ALKPHOS 53 05/04/2021 0933   BILITOT 0.4 05/04/2021 0933   GFRNONAA 50 (L) 10/21/2020 1100   GFRNONAA 60 12/01/2015 0954   GFRAA 58 (L) 10/21/2020 1100   GFRAA 70 12/01/2015  0954      Latest Ref Rng & Units 10/12/2023    1:33 PM 05/04/2021    9:33 AM 10/13/2020    8:54 AM  Hepatic Function  Total Protein 6.0 - 8.5 g/dL 6.6  6.4  6.8   Albumin 3.7 - 4.7 g/dL  4.4    AST 0 - 40 IU/L  19  25   ALT 0 - 44 IU/L  18  20   Alk Phosphatase 44 - 121 IU/L  53    Total Bilirubin 0.0 - 1.2 mg/dL  0.4  0.4   Bilirubin, Direct 0.00 - 0.40 mg/dL  6.44        Current Medications:     Current Outpatient Medications (Cardiovascular):    ezetimibe (ZETIA) 10 MG tablet, Take 1 tablet (10 mg total) by mouth daily. Please keep scheduled appointment for future refills. Thank you.   nitroGLYCERIN (NITROSTAT) 0.4 MG SL tablet, Place 1 tablet (0.4 mg total) under the tongue every 5 (five) minutes as needed for chest pain.     Current Outpatient Medications (Analgesics):    aspirin EC 81 MG tablet, Take 81 mg by mouth. Only takes occasionally Swallow whole.     Current Outpatient Medications (Other):    clotrimazole (ANTIFUNGAL, CLOTRIMAZOLE,) 1 % cream, Apply 1 application topically 2 (two) times daily.   ketoconazole (NIZORAL) 2 % cream, Apply 1 Application topically 2 (two) times daily.   methocarbamol (ROBAXIN) 500 MG tablet, TAKE ONE TABLET BY MOUTH EVERY 6 HOURS AS NEEDED FOR MUSCLE SPASMS   pantoprazole (PROTONIX) 40 MG tablet, TAKE 1 TABLET BY MOUTH DAILY   Facility-Administered Medications Ordered in Other Visits (Other):    regadenoson (LEXISCAN) injection SOLN 0.4 mg No current facility-administered medications for this visit.  Medical History:  Past Medical History:  Diagnosis Date   Arthritis    bilateral feet   Atrial flutter (HCC)    Echo 9/21: EF 60-65, no RWMA, normal RVSF, trivial MR, borderline dilation of aorta at sinus of Valsalva (37 mm), mild LAE, RVSP 26.8   Chicken pox    hx   Coronary artery disease    08/29/18 PCI/DES with bifurcation from LAD into 1st diag.    GERD (gastroesophageal reflux disease)    with certain foods/on OTC meds    Hyperlipidemia    on meds   Left shoulder pain 02/02/2013   Microscopic hematuria 02/02/2013   Vision loss of left eye    Allergies:  Allergies  Allergen Reactions   Repatha [Evolocumab] Other (See Comments)    Myalgias   Bempedoic Acid Other (See Comments)    (Nexletol) muscle pain.   Crestor [Rosuvastatin Calcium] Other (See Comments)    Myalgias (muscle/joint pain)   Statins     Other reaction(s): Other (See Comments), Other (See Comments) Myalgias (muscle/joint pain) Muscle aches Pt stated "goes to my muscles"  Surgical History:  He  has a past surgical history that includes Tonsillectomy and adenoidectomy (1959); LEFT HEART CATH AND CORONARY ANGIOGRAPHY (N/A, 08/08/2018); CORONARY STENT INTERVENTION (N/A, 08/29/2018); CORONARY BALLOON ANGIOPLASTY (N/A, 08/29/2018); Wisdom tooth extraction (1975); Esophageal manometry (N/A, 10/08/2020); A-FLUTTER ABLATION (N/A, 10/24/2020); Colonoscopy (10/2017); and Polypectomy (10/2017). Family History:  His family history includes Arthritis in his mother; Arthritis/Rheumatoid in his brother; Prostate cancer in his father.  REVIEW OF SYSTEMS  : All other systems reviewed and negative except where noted in the History of Present Illness.  PHYSICAL EXAM: There were no vitals taken for this visit. General Appearance: Well nourished, in no apparent distress. Head:   Normocephalic and atraumatic. Eyes:  sclerae anicteric,conjunctive pink  Respiratory: Respiratory effort normal, BS equal bilaterally without rales, rhonchi, wheezing. Cardio: RRR with no MRGs. Peripheral pulses intact.  Abdomen: Soft,  Obese ,active bowel sounds. No tenderness . Without guarding and Without rebound. No masses. Rectal: Not evaluated Musculoskeletal: Full ROM, Normal gait. Without edema. Skin:  Dry and intact without significant lesions or rashes Neuro: Alert and  oriented x4;  No focal deficits. Psych:  Cooperative. Normal mood and affect.    Doree Albee,  PA-C 9:17 AM

## 2023-11-18 ENCOUNTER — Other Ambulatory Visit (INDEPENDENT_AMBULATORY_CARE_PROVIDER_SITE_OTHER): Payer: Medicare Other

## 2023-11-18 ENCOUNTER — Ambulatory Visit (INDEPENDENT_AMBULATORY_CARE_PROVIDER_SITE_OTHER): Payer: Medicare Other | Admitting: Physician Assistant

## 2023-11-18 ENCOUNTER — Encounter: Payer: Self-pay | Admitting: Physician Assistant

## 2023-11-18 VITALS — BP 118/72 | HR 76 | Ht 70.0 in | Wt 192.0 lb

## 2023-11-18 DIAGNOSIS — E559 Vitamin D deficiency, unspecified: Secondary | ICD-10-CM

## 2023-11-18 DIAGNOSIS — I779 Disorder of arteries and arterioles, unspecified: Secondary | ICD-10-CM | POA: Diagnosis not present

## 2023-11-18 DIAGNOSIS — E538 Deficiency of other specified B group vitamins: Secondary | ICD-10-CM | POA: Diagnosis not present

## 2023-11-18 DIAGNOSIS — I25118 Atherosclerotic heart disease of native coronary artery with other forms of angina pectoris: Secondary | ICD-10-CM | POA: Diagnosis not present

## 2023-11-18 DIAGNOSIS — K219 Gastro-esophageal reflux disease without esophagitis: Secondary | ICD-10-CM | POA: Diagnosis not present

## 2023-11-18 DIAGNOSIS — K2970 Gastritis, unspecified, without bleeding: Secondary | ICD-10-CM

## 2023-11-18 LAB — VITAMIN D 25 HYDROXY (VIT D DEFICIENCY, FRACTURES): VITD: 25.74 ng/mL — ABNORMAL LOW (ref 30.00–100.00)

## 2023-11-18 MED ORDER — PANTOPRAZOLE SODIUM 20 MG PO TBEC: DELAYED_RELEASE_TABLET | ORAL | 3 refills | Status: DC

## 2023-11-18 NOTE — Patient Instructions (Addendum)
Your provider has requested that you go to the basement level for lab work before leaving today. Press "B" on the elevator. The lab is located at the first door on the left as you exit the elevator.  Please take your proton pump inhibitor medication, Protonix  daily Please take this medication 30 minutes to 1 hour before meals- this makes it more effective.  Avoid spicy and acidic foods Avoid fatty foods Limit your intake of coffee, tea, alcohol, and carbonated drinks Work to maintain a healthy weight Keep the head of the bed elevated at least 3 inches with blocks or a wedge pillow if you are having any nighttime symptoms Stay upright for 2 hours after eating Avoid meals and snacks three to four hours before bedtime  Follow up in 1 year  _______________________________________________________  If your blood pressure at your visit was 140/90 or greater, please contact your primary care physician to follow up on this.  _______________________________________________________  If you are age 38 or older, your body mass index should be between 23-30. Your Body mass index is 27.55 kg/m. If this is out of the aforementioned range listed, please consider follow up with your Primary Care Provider.  If you are age 34 or younger, your body mass index should be between 19-25. Your Body mass index is 27.55 kg/m. If this is out of the aformentioned range listed, please consider follow up with your Primary Care Provider.   ________________________________________________________  The [place] [facility] providers would like to encourage you to use [facility] to communicate with providers for non-urgent requests or questions.  Due to long hold times on the telephone, sending your provider a message by [facility] may be a faster and more efficient way to get a response.  Please allow 48 business hours for a response.  Please remember that this is for non-urgent requests.   _______________________________________________________   Thank you for entrusting me with your care and choosing [facility].  Quentin Mulling PA-C

## 2023-11-21 ENCOUNTER — Ambulatory Visit (INDEPENDENT_AMBULATORY_CARE_PROVIDER_SITE_OTHER): Payer: Medicare Other | Admitting: Family Medicine

## 2023-11-21 ENCOUNTER — Encounter: Payer: Self-pay | Admitting: Family Medicine

## 2023-11-21 VITALS — BP 98/70 | HR 85 | Temp 98.2°F | Ht 70.0 in | Wt 191.9 lb

## 2023-11-21 DIAGNOSIS — R350 Frequency of micturition: Secondary | ICD-10-CM | POA: Diagnosis not present

## 2023-11-21 NOTE — Progress Notes (Signed)
Established Patient Office Visit  Subjective   Patient ID: Ronnie Vazquez, male    DOB: 02/26/1946  Age: 77 y.o. MRN: [id number]  Chief Complaint  Patient presents with   Results    Patient requests to discuss elevated B12 level results from last visit with Dr Caryl Never    Pt is here to follow up from his visit with Dr. Caryl Never. States that he was seen for BL foot numbness -- he has a long history of lower back problems and he had many injections in his hip and spine to help with pain. States that his feet are getting numb and they start to burn, then it is going up his leg. He had a B12 level which was >1500, states that he had his EMG done (not in the epic record) states that he was told that his EMG was "negative for neuropathy" but that it was his back problem that was to blame for his foot numbness/burning. States they tried to give him gabapentin but he got a lot of side effects from the medication so he stopped it.   States he had an appointment with the "shot guy" to talk about getting another shot. States that he is urinating more now since having this back problem. States he is getting up 2 times each night to urinate. Was worried that it might be related to his nerve issues, states that he sometimes "loses control' of his bladder, but other times.       Current Outpatient Medications  Medication Instructions   aspirin EC 81 mg, Oral, Only takes occasionally Swallow whole.   clotrimazole (ANTIFUNGAL, CLOTRIMAZOLE,) 1 % cream 1 application , Topical, 2 times daily   ezetimibe (ZETIA) 10 mg, Oral, Daily, Please keep scheduled appointment for future refills. Thank you.   ketoconazole (NIZORAL) 2 % cream 1 Application, Topical, 2 times daily   methocarbamol (ROBAXIN) 500 MG tablet TAKE ONE TABLET BY MOUTH EVERY 6 HOURS AS NEEDED FOR MUSCLE SPASMS   nitroGLYCERIN (NITROSTAT) 0.4 mg, Sublingual, Every 5 min PRN   pantoprazole (PROTONIX) 20 MG tablet TAKE 1 TABLET BY MOUTH DAILY 30  mins before food    Patient Active Problem List   Diagnosis Date Noted   Osteoarthritis of toe joint, right 06/06/2023   Hemorrhoids 06/04/2021   Gastritis without bleeding 06/04/2021   Esophageal dysphagia    Typical atrial flutter (HCC) 09/06/2020   B12 deficiency 11/14/2019   Gastroesophageal reflux disease 11/28/2018   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     Objective:     BP 98/70 (BP Location: Left Arm, Patient Position: Sitting, Cuff Size: Normal)   Pulse 85   Temp 98.2 F (36.8 C) (Oral)   Ht 5\' 10"  (1.778 m)   Wt 191 lb 14.4 oz (87 kg)   SpO2 98%   BMI 27.53 kg/m    Physical Exam Vitals reviewed.  Constitutional:      Appearance: Normal appearance. He is well-groomed and normal weight.  Cardiovascular:     Rate and Rhythm: Normal rate and regular rhythm.     Pulses: Normal pulses.     Heart sounds: Normal heart sounds, S1 normal and S2 normal. No murmur heard. Pulmonary:     Effort: Pulmonary effort is  normal.     Breath sounds: Normal breath sounds and air entry. No rales.  Abdominal:     General: Bowel sounds are normal.  Musculoskeletal:     Right lower leg: No edema.     Left lower leg: No edema.  Neurological:     General: No focal deficit present.     Mental Status: He is alert and oriented to person, place, and time.     Gait: Gait is intact.  Psychiatric:        Mood and Affect: Mood and affect normal.    No results found for any visits on 11/21/23.    The 10-year ASCVD risk score (Arnett DK, et al., 2019) is: 15.4%    Assessment & Plan:  Urinary frequency -     Ambulatory referral to Urology   Normal physical exam findings. It is not clear what type of urinary frequency he is having,  could be nerve related from his spine or possible from enlarged prostate given his description, we discussed sending him to the urologist for a work up to determine what the cause is and he is agreeable. I spent 20 minutes with the paitent today reviewing his notes from [facility], reviewing labs and discussing possible etiologies for the burning in his feet.   Return in about 6 months (around 05/20/2024).    Karie Georges, MD

## 2023-11-23 DIAGNOSIS — M5416 Radiculopathy, lumbar region: Secondary | ICD-10-CM | POA: Diagnosis not present

## 2023-11-26 ENCOUNTER — Other Ambulatory Visit: Payer: Self-pay | Admitting: Cardiovascular Disease

## 2023-11-28 ENCOUNTER — Ambulatory Visit: Payer: Medicare Other | Attending: Cardiovascular Disease | Admitting: Cardiovascular Disease

## 2023-11-28 ENCOUNTER — Encounter: Payer: Self-pay | Admitting: Cardiovascular Disease

## 2023-11-28 VITALS — BP 138/88 | HR 77 | Ht 70.0 in | Wt 193.8 lb

## 2023-11-28 DIAGNOSIS — E782 Mixed hyperlipidemia: Secondary | ICD-10-CM | POA: Diagnosis not present

## 2023-11-28 DIAGNOSIS — I2510 Atherosclerotic heart disease of native coronary artery without angina pectoris: Secondary | ICD-10-CM | POA: Insufficient documentation

## 2023-11-28 DIAGNOSIS — I10 Essential (primary) hypertension: Secondary | ICD-10-CM | POA: Diagnosis not present

## 2023-11-28 MED ORDER — AMLODIPINE BESYLATE 5 MG PO TABS: 5.0000 mg | ORAL_TABLET | Freq: Every day | ORAL | 3 refills | Status: DC

## 2023-11-28 NOTE — Patient Instructions (Signed)
Medication Instructions:  START Amlodipine 5mg  daily *If you need a refill on your cardiac medications before your next appointment, please call your pharmacy*  Follow-Up: At [facility], you and your health needs are our priority.  As part of our continuing mission to provide you with exceptional heart care, we have created designated Provider Care Teams.  These Care Teams include your primary Cardiologist (physician) and Advanced Practice Providers (APPs -  Physician Assistants and Nurse Practitioners) who all work together to provide you with the care you need, when you need it.  Your next appointment:   6 month(s)  Provider:   Tonny Bollman, MD  or APP

## 2023-11-28 NOTE — Progress Notes (Signed)
Cardiology Office Note:    Date:  11/28/2023   ID:  Ronnie Vazquez, DOB [birth year], MRN [id number]  PCP:  Karie Georges, MD   [facility] Providers Cardiologist:  Tonny Bollman, MD     Referring MD: Karie Georges, MD   Chief Complaint  Patient presents with   Coronary Artery Disease    History of Present Illness:    Ronnie Vazquez is a 77 y.o. male with a hx of coronary artery disease, presenting for follow-up evaluation. The patient underwent PCI of the LAD/first diagonal bifurcation and 2019 when he underwent LAD stenting. Since that time, he has been unable to tolerate any lipid-lowering therapies. He has tried all statin and nonstatin drugs without success. He has weakness and myalgias with lipid-lowering medications including Nexletol, PCSK9 inhibitors, and even the lowest doses of statin drugs.   He is here alone today. He's been having a lot of problems with his back after a mechanical fall one year ago. He has had some problems with BP control after he receives steroid injections.  Recently he has developed problems with elevated blood pressure again.  His blood pressure has been somewhat erratic with an occasional systolic reading less than 100 mmHg.  However, most of his blood pressure readings have been greater than 140/90 mmHg over the last 1 to 2 weeks.  He has had no chest pain, chest pressure, or shortness of breath.  No heart palpitations or other complaints.   Current Medications: Current Meds  Medication Sig   amLODipine (NORVASC) 5 MG tablet Take 1 tablet (5 mg total) by mouth daily.   aspirin EC 81 MG tablet Take 81 mg by mouth. Only takes occasionally Swallow whole.   ezetimibe (ZETIA) 10 MG tablet Take 1 tablet (10 mg total) by mouth daily. Please keep scheduled appointment for future refills. Thank you.   ketoconazole (NIZORAL) 2 % cream Apply 1 Application topically 2 (two) times daily.   methocarbamol (ROBAXIN) 500 MG tablet TAKE ONE TABLET  BY MOUTH EVERY 6 HOURS AS NEEDED FOR MUSCLE SPASMS   nitroGLYCERIN (NITROSTAT) 0.4 MG SL tablet Place 1 tablet (0.4 mg total) under the tongue every 5 (five) minutes as needed for chest pain.   pantoprazole (PROTONIX) 20 MG tablet TAKE 1 TABLET BY MOUTH DAILY 30 mins before food     Allergies:   Repatha [evolocumab], Bempedoic acid, Crestor [rosuvastatin calcium], and Statins   ROS:   Please see the history of present illness.    All other systems reviewed and are negative.  EKGs/Labs/Other Studies Reviewed:    The following studies were reviewed today: Cardiac Studies & Procedures   CARDIAC CATHETERIZATION  CARDIAC CATHETERIZATION 08/29/2018  Narrative Successful bifurcation PCI with stenting of the proximal LAD using a 3.0x30 mm Resolute Onyx DES and angioplasty of the first diagonal (through the LAD stent struts) with a 3.0 mm balloon  Recommend uninterrupted dual antiplatelet therapy with Aspirin 81mg  daily and Clopidogrel 75mg  daily for a minimum of 12 months with bifurcation lesion.  Findings Coronary Findings Diagnostic  Dominance: Right  Left Anterior Descending Prox LAD-1 lesion is 90% stenosed. Prox LAD-2 lesion is 70% stenosed.  First Diagonal Branch Ost 1st Diag lesion is 75% stenosed. ostial diagonal lesion  Intervention  Prox LAD-1 lesion Stent (Also treats lesions: Prox LAD-2) Lesion crossed with guidewire using a WIRE COUGAR XT STRL 190CM. Pre-stent angioplasty was performed using a BALLOON SAPPHIRE [place] 2.5X12. A drug-eluting stent was successfully placed using a STENT  RESOLUTE ONYX 3.0X30. Post-stent angioplasty was performed using a BALLOON SAPPHIRE [place] 3.25X18. Maximum pressure:  14 atm. The LAD lesion is initially crossed with a cougar wire.  The LAD is predilated with a 2.5 mm balloon.  Because the lesion involves the LAD diagonal bifurcation, the diagonal was also wired and attempts are made to predilate the diagonal without success.  Attempts were then made  to stent the LAD but a 3.0 x 30 mm resolute Onyx would not cross the mid segment of the vessel.  The LAD is then redilated with a 2.5 mm noncompliant balloon to 12 atm and this facilitated passage of the stent, now without difficulty.  The stent is carefully positioned and deployed so that it crosses the diagonal origin and covers the entirety of the LAD segmental disease.  The stent is deployed at 14 atm.  The diagonal branch is then rewired and dilated through the stent struts of the LAD stent.  Finally, the LAD stent is postdilated with a 3.25 mm noncompliant balloon to 14 atm throughout the stented segment.  At the completion of the procedure, there is TIMI-3 flow in both the LAD and diagonal and the patient is chest pain-free.  There is 0% residual stenosis at the LAD site and 20% residual stenosis at the diagonal site.  The Post-Intervention Lesion Assessment The intervention was successful. Pre-interventional TIMI flow is 3. Post-intervention TIMI flow is 3. No complications occurred at this lesion. There is a 0% residual stenosis post intervention.  Prox LAD-2 lesion Stent (Also treats lesions: Prox LAD-1) See details in Prox LAD-1 lesion. Post-Intervention Lesion Assessment The intervention was successful. Pre-interventional TIMI flow is 3. Post-intervention TIMI flow is 3. No complications occurred at this lesion. There is a 0% residual stenosis post intervention.  Ost 1st Diag lesion Angioplasty WIRE COUGAR XT STRL 190CM guidewire used to cross lesion. Balloon angioplasty was performed using a BALLOON SAPPHIRE 3.0X10. Maximum pressure: 8 atm. This lesion is treated after stenting of the LAD.  Initially, the diagonal branches wired with a cougar guidewire.  An attempt was made to predilate the branch but a 2.5 mm balloon would not cross the ostium.  I elected to treat the LAD first and this branches stented with a 3.0 x 30 mm resolute Onyx DES.  After stenting, the diagonal is rewired with a  Administrator, arts.  The initial trapped wire is removed.  The diagonal ostium is then dilated with a 2.5 mm balloon to 8 atm.  There was significant residual stenosis and after the LAD is postdilated, the diagonal is redilated with a 3.0 mm balloon to 8 atm for 45 seconds.  At the completion of the procedure there is an acceptable result with only 10 to 20% residual stenosis in the diagonal and TIMI-3 flow. Post-Intervention Lesion Assessment The intervention was successful. Pre-interventional TIMI flow is 3. Post-intervention TIMI flow is 3. No complications occurred at this lesion. There is a 20% residual stenosis post intervention.   CARDIAC CATHETERIZATION  CARDIAC CATHETERIZATION 08/08/2018  Narrative Images from the original result were not included.   Prox LAD-1 lesion is 90% stenosed.  Prox LAD-2 lesion is 70% stenosed.  The left ventricular systolic function is normal.  LV end diastolic pressure is normal.  The left ventricular ejection fraction is 55-65% by visual estimate.   Ronnie Vazquez is a 77 y.o. male   [id number] LOCATION:  FACILITY: [facility] PHYSICIAN: Nanetta Batty, M.D. 1946-08-27   DATE OF PROCEDURE:  08/08/2018  DATE OF  DISCHARGE:     CARDIAC CATHETERIZATION    History obtained from chart review.Ronnie Vazquez is a 77 y.o. mildly overweight married Caucasian male father of 2, grandfather of 5 grandchildren referred by Dr. Selena Batten for cardiovascular evaluation because of new onset exertional chest pain.  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.  His risk factors include untreated hyperlipidemia because of statin intolerance.  There is no family history.  He is never had a heart attack or stroke.  He noticed new onset exertional chest tightness 6 to 8 weeks ago when walking up an incline or on the treadmill.  This resolves with stopping the activity.  It does not radiate.  A Myoview stress test was performed that showed ischemia in the LAD territory.  Based on this, it was  elected to proceed with outpatient diagnostic coronary angiography.  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  This is a nondominant vessel and at most 2 to 20 mm in diameter.  It does not give off marginal branches.  His RCA is large dominant and free of disease.  His LAD has a 90% stenosis at the takeoff of a large first diagonal branch which is as big as the LAD.  There is a 77 to 75% stenosis just beyond this.  The issue has whether to stent across the diagonal branch with a long stent and provisionally intervene on the ostium of it of the diagonal branch is compromised, have an upfront to stent strategy or suggest a LIMA to the LAD and diagonal branch which would survive long-term patency of antiplatelet medications.  He does have normal LV function.  His angina is completely exertional.  I will review the options with my colleagues before making a final decision regarding street treatment strategy.  The sheath was removed and a TR band was placed on the right wrist to achieve patent hemostasis.  The patient left the lab in stable condition.  Nanetta Batty. MD, [facility] 08/08/2018 8:33 AM       Findings Coronary Findings Diagnostic  Dominance: Right  Left Anterior Descending Prox LAD-1 lesion is 90% stenosed. Prox LAD-2 lesion is 70% stenosed.  Intervention  No interventions have been documented.   STRESS TESTS  MYOCARDIAL PERFUSION IMAGING 12/13/2022  Narrative   The study is normal. The study is low risk.   A Bruce protocol stress test was performed. Exercise capacity was normal. Patient exercised for 8 min and 0 sec. Maximum HR of 125 bpm. MPHR 85.0 %. Peak METS 9.6 . The patient experienced no angina during the test. The test was stopped because the patient experienced fatigue and dyspnea. The patient reported dyspnea and fatigue during the stress test. Normal blood pressure and normal heart rate response  noted during stress. Heart rate recovery was normal.   LV perfusion is normal. There is no evidence of ischemia. There is no evidence of infarction.   2.0 mm of horizontal ST depression (I, II, III, aVF, V5 and V6) was noted. ST depression began at minute 6:50. ST deviation returned to baseline after 1-5 mins of recovery. Arrhythmias during stress: occasional PVCs. Arrhythmias during recovery: occasional PVCs. The ECG was positive for ischemia.   Left ventricular function is normal. Nuclear stress EF: 61 %. The left ventricular ejection fraction is normal (55-65%). End diastolic cavity size is normal. End systolic cavity size is normal.   Prior study available for comparison from 07/30/2019. No  changes in perfusion compared to prior study.   ECHOCARDIOGRAM  ECHOCARDIOGRAM COMPLETE 06/24/2023  Narrative ECHOCARDIOGRAM REPORT    Patient Name:   Ronnie Vazquez Date of Exam: 06/24/2023 Medical Rec #:  [id number]     Height:       70.0 in Accession #:    [id number]    Weight:       188.0 lb Date of Birth:  [birth year]     BSA:          2.033 m Patient Age:    76 years      BP:           120/78 mmHg Patient Gender: M             HR:           68 bpm. Exam Location:  [address]  Procedure: 2D Echo, Cardiac Doppler and Color Doppler  Indications:    I48.91 Atrial Flutter  History:        Patient has prior history of Echocardiogram examinations, most recent 09/12/2020. CAD, Arrythmias:Atrial Flutter; Risk Factors:Dyslipidemia.  Sonographer:    Daphine Deutscher RDCS Referring Phys: [id number] Jonita Albee  IMPRESSIONS   1. Left ventricular ejection fraction, by estimation, is 55 to 60%. The left ventricle has normal function. The left ventricle has no regional wall motion abnormalities. There is mild concentric left ventricular hypertrophy. Left ventricular diastolic parameters are consistent with Grade I diastolic dysfunction (impaired relaxation). 2. Right ventricular systolic  function is normal. The right ventricular size is normal. 3. Left atrial size was moderately dilated. 4. Right atrial size was mildly dilated. 5. The mitral valve is normal in structure. Trivial mitral valve regurgitation. No evidence of mitral stenosis. 6. The aortic valve is tricuspid. There is mild calcification of the aortic valve. Aortic valve regurgitation is not visualized. Aortic valve sclerosis/calcification is present, without any evidence of aortic stenosis. 7. Aortic dilatation noted. There is borderline dilatation of the aortic root, measuring 38 mm. There is borderline dilatation of the ascending aorta, measuring 39 mm. 8. The inferior vena cava is normal in size with greater than 50% respiratory variability, suggesting right atrial pressure of 3 mmHg.  FINDINGS Left Ventricle: Left ventricular ejection fraction, by estimation, is 55 to 60%. The left ventricle has normal function. The left ventricle has no regional wall motion abnormalities. The left ventricular internal cavity size was normal in size. There is mild concentric left ventricular hypertrophy. Left ventricular diastolic parameters are consistent with Grade I diastolic dysfunction (impaired relaxation).  Right Ventricle: The right ventricular size is normal. No increase in right ventricular wall thickness. Right ventricular systolic function is normal.  Left Atrium: Left atrial size was moderately dilated.  Right Atrium: Right atrial size was mildly dilated.  Pericardium: There is no evidence of pericardial effusion.  Mitral Valve: The mitral valve is normal in structure. Trivial mitral valve regurgitation. No evidence of mitral valve stenosis.  Tricuspid Valve: The tricuspid valve is normal in structure. Tricuspid valve regurgitation is trivial. No evidence of tricuspid stenosis.  Aortic Valve: The aortic valve is tricuspid. There is mild calcification of the aortic valve. Aortic valve regurgitation is not  visualized. Aortic valve sclerosis/calcification is present, without any evidence of aortic stenosis.  Pulmonic Valve: The pulmonic valve was normal in structure. Pulmonic valve regurgitation is trivial. No evidence of pulmonic stenosis.  Aorta: Aortic dilatation noted. There is borderline dilatation of the aortic root, measuring 38 mm. There is borderline dilatation of the  ascending aorta, measuring 39 mm.  Venous: The inferior vena cava is normal in size with greater than 50% respiratory variability, suggesting right atrial pressure of 3 mmHg.  IAS/Shunts: No atrial level shunt detected by color flow Doppler.   LEFT VENTRICLE PLAX 2D LVIDd:         4.40 cm   Diastology LVIDs:         3.00 cm   LV e' medial:    6.04 cm/s LV PW:         1.20 cm   LV E/e' medial:  8.5 LV IVS:        1.20 cm   LV e' lateral:   8.65 cm/s LVOT diam:     2.10 cm   LV E/e' lateral: 5.9 LV SV:         64 LV SV Index:   32 LVOT Area:     3.46 cm   RIGHT VENTRICLE             IVC RV Basal diam:  3.00 cm     IVC diam: 1.30 cm RV S prime:     10.40 cm/s TAPSE (M-mode): 1.8 cm  LEFT ATRIUM             Index        RIGHT ATRIUM           Index LA diam:        4.50 cm 2.21 cm/m   RA Area:     13.40 cm LA Vol (A2C):   38.9 ml 19.13 ml/m  RA Volume:   38.20 ml  18.79 ml/m LA Vol (A4C):   38.9 ml 19.13 ml/m LA Biplane Vol: 41.5 ml 20.41 ml/m AORTIC VALVE LVOT Vmax:   85.63 cm/s LVOT Vmean:  55.900 cm/s LVOT VTI:    0.186 m  AORTA Ao Root diam: 3.80 cm Ao Asc diam:  3.90 cm  MITRAL VALVE MV Area (PHT): 2.84 cm    SHUNTS MV Decel Time: 268 msec    Systemic VTI:  0.19 m MV E velocity: 51.10 cm/s  Systemic Diam: 2.10 cm MV A velocity: 82.05 cm/s MV E/A ratio:  0.62  Arvilla Meres MD Electronically signed by Arvilla Meres MD Signature Date/Time: 06/24/2023/5:53:10 PM    Final    MONITORS  LONG TERM MONITOR (3-14 DAYS) 06/09/2023  Narrative Patch Wear Time:  6 days and 12 hours  (2024-05-31T23:11:28-0400 to 2024-06-07T12:00:57-0400)  Patient had a min HR of 51 bpm, max HR of 160 bpm, and avg HR of 69 bpm. Predominant underlying rhythm was Sinus Rhythm. 1 run of Ventricular Tachycardia occurred lasting 9 beats with a max rate of 160 bpm (avg 138 bpm). 4 Supraventricular Tachycardia runs occurred, the run with the fastest interval lasting 5 beats with a max rate of 118 bpm, the longest lasting 6 beats with an avg rate of 91 bpm. Some episodes of Supraventricular Tachycardia may be possible Atrial Tachycardia with variable block. Isolated SVEs were occasional (1.2%, 7361), SVE Couplets were rare (<1.0%, 110), and SVE Triplets were rare (<1.0%, 73). Isolated VEs were rare (<1.0%), VE Couplets were rare (<1.0%), and no VE Triplets were present. Ventricular Bigeminy and Trigeminy were present.  SUMMARY: The basic rhythm is normal sinus with an average HR of 69 bpm No atrial fibrillation or flutter No high-grade heart block or pathologic pauses There is a 9 beat ventricular run, slower than typical VT at 138 bpm. No sustained arrhythmias  EKG:        Recent Labs: 05/25/2023: Magnesium 2.3; TSH 1.980 10/12/2023: BUN 28; Creatinine, Ser 1.36; Potassium 4.5; Sodium 139  Recent Lipid Panel    Component Value Date/Time   CHOL 196 05/25/2023 0950   TRIG 147 05/25/2023 0950   HDL 48 05/25/2023 0950   CHOLHDL 4.1 05/25/2023 0950   CHOLHDL 4.2 10/13/2020 0854   VLDL 29.0 06/15/2019 0809   LDLCALC 122 (H) 05/25/2023 0950   LDLCALC 106 (H) 10/13/2020 0854   LDLDIRECT 117.0 08/12/2017 0921     Risk Assessment/Calculations:                Physical Exam:    VS:  BP 138/88   Pulse 77   Ht 5\' 10"  (1.778 m)   Wt 193 lb 12.8 oz (87.9 kg)   SpO2 95%   BMI 27.81 kg/m     Wt Readings from Last 3 Encounters:  11/28/23 193 lb 12.8 oz (87.9 kg)  11/21/23 191 lb 14.4 oz (87 kg)  11/18/23 192 lb (87.1 kg)     GEN:  Well nourished, well developed in no acute  distress HEENT: Normal NECK: No JVD; No carotid bruits LYMPHATICS: No lymphadenopathy CARDIAC: RRR, no murmurs, rubs, gallops RESPIRATORY:  Clear to auscultation without rales, wheezing or rhonchi  ABDOMEN: Soft, non-tender, non-distended MUSCULOSKELETAL:  No edema; No deformity  SKIN: Warm and dry NEUROLOGIC:  Alert and oriented x 3 PSYCHIATRIC:  Normal affect   Assessment & Plan Essential hypertension Blood pressure is elevated.  Recommend amlodipine 5 mg daily.  Prescription written.  Advised him to take this in the morning and monitor his blood pressure about 3 days/week.  We discussed proper blood pressure measurement technique today. Coronary artery disease involving native coronary artery of native heart without angina pectoris Stable without symptoms of angina.  Continue aspirin for antiplatelet therapy.  Last stress Myoview from 2023 reviewed with no ischemia. Mixed hyperlipidemia Statin intolerant.  Intolerant of Repatha.  Intolerant of Nexletol.  Continue Zetia.     Medication Adjustments/Labs and Tests Ordered: Current medicines are reviewed at length with the patient today.  Concerns regarding medicines are outlined above.  No orders of the defined types were placed in this encounter.  Meds ordered this encounter  Medications   amLODipine (NORVASC) 5 MG tablet    Sig: Take 1 tablet (5 mg total) by mouth daily.    Dispense:  90 tablet    Refill:  3    Patient Instructions  Medication Instructions:  START Amlodipine 5mg  daily *If you need a refill on your cardiac medications before your next appointment, please call your pharmacy*  Follow-Up: At [facility], you and your health needs are our priority.  As part of our continuing mission to provide you with exceptional heart care, we have created designated Provider Care Teams.  These Care Teams include your primary Cardiologist (physician) and Advanced Practice Providers (APPs -  Physician Assistants and  Nurse Practitioners) who all work together to provide you with the care you need, when you need it.  Your next appointment:   6 month(s)  Provider:   Tonny Bollman, MD  or APP      Signed, Tonny Bollman, MD  11/28/2023 11:04 AM    [facility]

## 2023-11-29 ENCOUNTER — Other Ambulatory Visit: Payer: Self-pay

## 2023-11-29 MED ORDER — EZETIMIBE 10 MG PO TABS: 10.0000 mg | ORAL_TABLET | Freq: Every day | ORAL | 3 refills | Status: DC

## 2023-12-01 ENCOUNTER — Telehealth: Payer: Self-pay | Admitting: Family Medicine

## 2023-12-01 NOTE — Telephone Encounter (Signed)
Patient called to talk with Dr. Cooper or nurse. Please call back 

## 2023-12-02 NOTE — Telephone Encounter (Signed)
Returned call to patient who seen [facility] on 11/28/23: Essential hypertension Blood pressure is elevated.  Recommend amlodipine 5 mg daily.  Prescription written.  Advised him to take this in the morning and monitor his blood pressure about 3 days/week.  We discussed proper blood pressure measurement technique today.  Patient is very anxious about taking his blood pressure. He took his home cuff to a neighbor/doctor friend and verified that his monitor is reading correctly. Asks how long the Amlodipine should take to work  because he hasn't noticed much improvement. Advised he give it the full two weeks, and try to remain calm when checking him. Reassured him that his numbers are stage 1-2 hypertension and he is asymptomatic and we have several effective options to treat his pressure if needed. States he feels reassured. Will call back with log when due.

## 2023-12-07 DIAGNOSIS — M5416 Radiculopathy, lumbar region: Secondary | ICD-10-CM | POA: Diagnosis not present

## 2023-12-08 ENCOUNTER — Encounter: Payer: Self-pay | Admitting: Cardiovascular Disease

## 2023-12-08 ENCOUNTER — Ambulatory Visit: Payer: Medicare Other | Admitting: Family Medicine

## 2023-12-09 DIAGNOSIS — R3915 Urgency of urination: Secondary | ICD-10-CM | POA: Diagnosis not present

## 2023-12-09 DIAGNOSIS — R350 Frequency of micturition: Secondary | ICD-10-CM | POA: Diagnosis not present

## 2023-12-09 DIAGNOSIS — N401 Enlarged prostate with lower urinary tract symptoms: Secondary | ICD-10-CM | POA: Diagnosis not present

## 2023-12-09 NOTE — Telephone Encounter (Signed)
Patient is calling to follow up. Patient gave recent BP readings.Ronnie Vazquez 124/84 This morning 108/74 Today at 2 PM 101/72  Patient stated he has felt dizzy and is concerned about how low his bp is. Please advise.

## 2024-01-04 DIAGNOSIS — L578 Other skin changes due to chronic exposure to nonionizing radiation: Secondary | ICD-10-CM | POA: Diagnosis not present

## 2024-01-04 DIAGNOSIS — D225 Melanocytic nevi of trunk: Secondary | ICD-10-CM | POA: Diagnosis not present

## 2024-01-04 DIAGNOSIS — R202 Paresthesia of skin: Secondary | ICD-10-CM | POA: Diagnosis not present

## 2024-01-11 DIAGNOSIS — N401 Enlarged prostate with lower urinary tract symptoms: Secondary | ICD-10-CM | POA: Diagnosis not present

## 2024-01-11 DIAGNOSIS — R350 Frequency of micturition: Secondary | ICD-10-CM | POA: Diagnosis not present

## 2024-01-11 DIAGNOSIS — N3941 Urge incontinence: Secondary | ICD-10-CM | POA: Diagnosis not present

## 2024-01-11 DIAGNOSIS — R351 Nocturia: Secondary | ICD-10-CM | POA: Diagnosis not present

## 2024-01-13 DIAGNOSIS — M5416 Radiculopathy, lumbar region: Secondary | ICD-10-CM | POA: Diagnosis not present

## 2024-01-13 DIAGNOSIS — R200 Anesthesia of skin: Secondary | ICD-10-CM | POA: Diagnosis not present

## 2024-01-18 ENCOUNTER — Other Ambulatory Visit: Payer: Self-pay | Admitting: Gastroenterology

## 2024-01-19 DIAGNOSIS — S00411A Abrasion of right ear, initial encounter: Secondary | ICD-10-CM | POA: Diagnosis not present

## 2024-01-19 DIAGNOSIS — R42 Dizziness and giddiness: Secondary | ICD-10-CM | POA: Diagnosis not present

## 2024-01-19 DIAGNOSIS — H6121 Impacted cerumen, right ear: Secondary | ICD-10-CM | POA: Diagnosis not present

## 2024-02-02 DIAGNOSIS — M5416 Radiculopathy, lumbar region: Secondary | ICD-10-CM | POA: Diagnosis not present

## 2024-02-06 ENCOUNTER — Other Ambulatory Visit: Payer: Self-pay | Admitting: Neurological Surgery

## 2024-02-06 DIAGNOSIS — M5416 Radiculopathy, lumbar region: Secondary | ICD-10-CM

## 2024-02-08 ENCOUNTER — Ambulatory Visit
Admission: RE | Admit: 2024-02-08 | Discharge: 2024-02-08 | Disposition: A | Discharge status: Home / Self Care | Payer: Medicare Other | Source: Ambulatory Visit | Attending: Neurological Surgery | Admitting: Neurological Surgery

## 2024-02-08 DIAGNOSIS — M5416 Radiculopathy, lumbar region: Secondary | ICD-10-CM

## 2024-02-15 DIAGNOSIS — R350 Frequency of micturition: Secondary | ICD-10-CM | POA: Diagnosis not present

## 2024-02-15 DIAGNOSIS — R3121 Asymptomatic microscopic hematuria: Secondary | ICD-10-CM | POA: Diagnosis not present

## 2024-02-15 DIAGNOSIS — N3941 Urge incontinence: Secondary | ICD-10-CM | POA: Diagnosis not present

## 2024-02-15 DIAGNOSIS — N401 Enlarged prostate with lower urinary tract symptoms: Secondary | ICD-10-CM | POA: Diagnosis not present

## 2024-02-17 DIAGNOSIS — M5416 Radiculopathy, lumbar region: Secondary | ICD-10-CM | POA: Diagnosis not present

## 2024-02-17 DIAGNOSIS — Z6827 Body mass index (BMI) 27.0-27.9, adult: Secondary | ICD-10-CM | POA: Diagnosis not present

## 2024-02-20 ENCOUNTER — Telehealth: Payer: Self-pay | Admitting: Cardiovascular Disease

## 2024-02-20 ENCOUNTER — Telehealth: Payer: Self-pay | Admitting: *Deleted

## 2024-02-20 NOTE — Telephone Encounter (Signed)
 S/w the pt and he has been added on 02/21/24 ok per Robin Searing, NP due to procedure date 02/24/24 and request just received today. Pt tells me that Dr. Yetta Barre said to hold ASA x 7 days, though they just scheduled procedure today and he took ASA this AM. I advised pt of our recommendations for ASA. I advised the pt to not take ASA tomorrow until he s/w preop APP tomorrow further about ASA. Pt is agreeable to plan of care.   I will update all parties involved.   Med rec and consent are done.

## 2024-02-20 NOTE — Telephone Encounter (Signed)
 S/w the pt and he has been added on 02/21/24 ok per Robin Searing, NP due to procedure date 02/24/24 and request just received today. Pt tells me that Dr. Yetta Barre said to hold ASA x 7 days, though they just scheduled procedure today and he took ASA this AM. I advised pt of our recommendations for ASA. I advised the pt to not take ASA tomorrow until he s/w preop APP tomorrow further about ASA. Pt is agreeable to plan of care.   I will update all parties involved.   Med rec and consent are done.      Patient Consent for Virtual Visit        Ronnie Vazquez has provided verbal consent on 02/20/2024 for a virtual visit (video or telephone).   CONSENT FOR VIRTUAL VISIT FOR:  Ronnie Vazquez  By participating in this virtual visit I agree to the following:  I hereby voluntarily request, consent and authorize [facility] and its employed or contracted physicians, physician assistants, nurse practitioners or other licensed health care professionals (the Practitioner), to provide me with telemedicine health care services (the "Services") as deemed necessary by the treating Practitioner. I acknowledge and consent to receive the Services by the Practitioner via telemedicine. I understand that the telemedicine visit will involve communicating with the Practitioner through live audiovisual communication technology and the disclosure of certain medical information by electronic transmission. I acknowledge that I have been given the opportunity to request an in-person assessment or other available alternative prior to the telemedicine visit and am voluntarily participating in the telemedicine visit.  I understand that I have the right to withhold or withdraw my consent to the use of telemedicine in the course of my care at any time, without affecting my right to future care or treatment, and that the Practitioner or I may terminate the telemedicine visit at any time. I understand that I have the right to  inspect all information obtained and/or recorded in the course of the telemedicine visit and may receive copies of available information for a reasonable fee.  I understand that some of the potential risks of receiving the Services via telemedicine include:  Delay or interruption in medical evaluation due to technological equipment failure or disruption; Information transmitted may not be sufficient (e.g. poor resolution of images) to allow for appropriate medical decision making by the Practitioner; and/or  In rare instances, security protocols could fail, causing a breach of personal health information.  Furthermore, I acknowledge that it is my responsibility to provide information about my medical history, conditions and care that is complete and accurate to the best of my ability. I acknowledge that Practitioner's advice, recommendations, and/or decision may be based on factors not within their control, such as incomplete or inaccurate data provided by me or distortions of diagnostic images or specimens that may result from electronic transmissions. I understand that the practice of medicine is not an exact science and that Practitioner makes no warranties or guarantees regarding treatment outcomes. I acknowledge that a copy of this consent can be made available to me via my patient portal [facility] MyChart), or I can request a printed copy by calling the office of [facility].    I understand that my insurance will be billed for this visit.   I have read or had this consent read to me. I understand the contents of this consent, which adequately explains the benefits and risks of the Services being provided via telemedicine.  I  have been provided ample opportunity to ask questions regarding this consent and the Services and have had my questions answered to my satisfaction. I give my informed consent for the services to be provided through the use of telemedicine in my medical  care

## 2024-02-20 NOTE — Telephone Encounter (Signed)
   Name: Ronnie Vazquez  DOB: 19-Apr-1946  MRN: [id number]  Primary Cardiologist: Tonny Bollman, MD   Preoperative team, please contact this patient and set up a phone call appointment for further preoperative risk assessment. Please obtain consent and complete medication review. Thank you for your help.  I confirm that guidance regarding antiplatelet and oral anticoagulation therapy has been completed and, if necessary, noted below.  Patient will continue ASA 81 mg without interruption.  I also confirmed the patient resides in the state of [place]. As per [facility] Medical Board telemedicine laws, the patient must reside in the state in which the provider is licensed.   Napoleon Form, Leodis Rains, NP 02/20/2024, 11:33 AM [facility]

## 2024-02-20 NOTE — Telephone Encounter (Signed)
   Pre-operative Risk Assessment    Patient Name: Ronnie Vazquez  DOB: Oct 25, 1946 MRN: [id number]   Date of last office visit: 11/28/2023  Date of next office visit: none   Request for Surgical Clearance    Procedure:   L4-5 Lunbar laminectomy  Date of Surgery:  Clearance 02/24/24                                Surgeon:  Dr. Tia Alert Surgeon's Group or Practice Name:  [facility] Phone number:  [phone] Fax number:  [phone]   Type of Clearance Requested:   - Medical  - Pharmacy:  Hold Aspirin Need instruction   Type of Anesthesia:  General    Additional requests/questions:    SignedRoyann Shivers   02/20/2024, 10:51 AM

## 2024-02-21 ENCOUNTER — Ambulatory Visit: Payer: Medicare Other | Attending: Cardiology

## 2024-02-21 DIAGNOSIS — Z01810 Encounter for preprocedural cardiovascular examination: Secondary | ICD-10-CM | POA: Insufficient documentation

## 2024-02-21 NOTE — Progress Notes (Signed)
 Virtual Visit via Telephone Note   Because of Ronnie Vazquez co-morbid illnesses, he is at least at moderate risk for complications without adequate follow up.  This format is felt to be most appropriate for this patient at this time.  Due to technical limitations with video connection (technology), today's appointment will be conducted as an audio only telehealth visit, and Ronnie Vazquez verbally agreed to proceed in this manner.   All issues noted in this document were discussed and addressed.  No physical exam could be performed with this format.  Evaluation Performed:  Preoperative cardiovascular risk assessment _____________   Date:  02/21/2024   Patient ID:  Ronnie Vazquez, DOB 1946/07/20, MRN [id number] Patient Location:  Home Provider location:   Office  Primary Care Provider:  Karie Georges, MD Primary Cardiologist:  Tonny Bollman, MD  Chief Complaint / Patient Profile   78 y.o. y/o male with a h/o CAD with history of LAD PCI in 2019, atrial flutter s/p ablation 10/24/20, GERD, and HLD  who is pending L4-5 Lumbar laminectomy  and presents today for telephonic preoperative cardiovascular risk assessment.  History of Present Illness    Ronnie Vazquez is a 78 y.o. male who presents via audio/video conferencing for a telehealth visit today.  Pt was last seen in cardiology clinic on 11/28/2023 by Dr. Excell Seltzer.  At that time Ronnie Vazquez was doing well from a cardiac standpoint but endorsed lots of back problems in the setting of a mechanical fall. He also noted somewhat erratic blood pressures and elevated blood pressures during his visit.  He was started on amlodipine 5 mg daily.  The patient is now pending procedure as outlined above. Since his last visit, he has been doing well with improvement to BP after completing oral steroids and steroid injections.  He reports that he is no longer taking amlodipine and blood pressures have been stable and 120s over 70s.  He denies chest  pain, shortness of breath, lower extremity edema, fatigue, palpitations, melena, hematuria, hemoptysis, diaphoresis, weakness, presyncope, syncope, orthopnea, and PND.   Past Medical History    Past Medical History:  Diagnosis Date   Arthritis    bilateral feet   Atrial flutter (HCC)    Echo 9/21: EF 60-65, no RWMA, normal RVSF, trivial MR, borderline dilation of aorta at sinus of Valsalva (37 mm), mild LAE, RVSP 26.8   Chicken pox    hx   Coronary artery disease    08/29/18 PCI/DES with bifurcation from LAD into 1st diag.    GERD (gastroesophageal reflux disease)    with certain foods/on OTC meds   Hyperlipidemia    on meds   Left shoulder pain 02/02/2013   Microscopic hematuria 02/02/2013   Vision loss of left eye    Past Surgical History:  Procedure Laterality Date   A-FLUTTER ABLATION N/A 10/24/2020   Procedure: A-FLUTTER ABLATION;  Surgeon: Duke Salvia, MD;  Location: [facility] INVASIVE CV LAB;  Service: Cardiovascular;  Laterality: N/A;   COLONOSCOPY  10/2017   MS-suprep(good)-SSA/TICS/hems/TA's-3 yr recall   CORONARY BALLOON ANGIOPLASTY N/A 08/29/2018   Procedure: CORONARY BALLOON ANGIOPLASTY;  Surgeon: Tonny Bollman, MD;  Location: [facility] INVASIVE CV LAB;  Service: Cardiovascular;  Laterality: N/A;   CORONARY STENT INTERVENTION N/A 08/29/2018   Procedure: CORONARY STENT INTERVENTION;  Surgeon: Tonny Bollman, MD;  Location: [facility] INVASIVE CV LAB;  Service: Cardiovascular;  Laterality: N/A;   ESOPHAGEAL MANOMETRY N/A 10/08/2020   Procedure: ESOPHAGEAL MANOMETRY (EM);  Surgeon:  Meryl Dare, MD;  Location: [facility]OSCOPY;  Service: Endoscopy;  Laterality: N/A;   LEFT HEART CATH AND CORONARY ANGIOGRAPHY N/A 08/08/2018   Procedure: LEFT HEART CATH AND CORONARY ANGIOGRAPHY;  Surgeon: Runell Gess, MD;  Location: [facility] INVASIVE CV LAB;  Service: Cardiovascular;  Laterality: N/A;   POLYPECTOMY  10/2017   SSA/TA's   TONSILLECTOMY AND ADENOIDECTOMY  1959   WISDOM TOOTH EXTRACTION  1975     Allergies  Allergies  Allergen Reactions   Repatha [Evolocumab] Other (See Comments)    Myalgias   Bempedoic Acid Other (See Comments)    (Nexletol) muscle pain.   Crestor [Rosuvastatin Calcium] Other (See Comments)    Myalgias (muscle/joint pain)   Statins     Other reaction(s): Other (See Comments), Other (See Comments) Myalgias (muscle/joint pain) Muscle aches Pt stated "goes to my muscles"     Home Medications    Prior to Admission medications   Medication Sig Start Date End Date Taking? Authorizing Provider  amLODipine (NORVASC) 5 MG tablet Take 1 tablet (5 mg total) by mouth daily. 11/28/23   Tonny Bollman, MD  aspirin EC 81 MG tablet Take 81 mg by mouth. Only takes occasionally Swallow whole.    [provider]  ezetimibe (ZETIA) 10 MG tablet Take 1 tablet (10 mg total) by mouth daily. 11/29/23   Tonny Bollman, MD  ketoconazole (NIZORAL) 2 % cream Apply 1 Application topically 2 (two) times daily. 06/06/23   Karie Georges, MD  methocarbamol (ROBAXIN) 500 MG tablet TAKE ONE TABLET BY MOUTH EVERY 6 HOURS AS NEEDED FOR MUSCLE SPASMS 05/03/23   Karie Georges, MD  nitroGLYCERIN (NITROSTAT) 0.4 MG SL tablet Place 1 tablet (0.4 mg total) under the tongue every 5 (five) minutes as needed for chest pain. 06/07/23 11/28/23  Jonita Albee, PA-C  pantoprazole (PROTONIX) 20 MG tablet TAKE 1 TABLET BY MOUTH DAILY 30 mins before food 11/18/23   Quentin Mulling R, PA-C  pantoprazole (PROTONIX) 40 MG tablet TAKE 1 TABLET BY MOUTH DAILY 01/19/24   Zehr, Princella Pellegrini, PA-C    Physical Exam    Vital Signs:  Ronnie Vazquez does not have vital signs available for review today.  Given telephonic nature of communication, physical exam is limited. AAOx3. NAD. Normal affect.  Speech and respirations are unlabored.  Accessory Clinical Findings    None  Assessment & Plan    1.  Preoperative Cardiovascular Risk Assessment: -Patients RCRI score is 6.6% The patient  affirms he has been doing well without any new cardiac symptoms. They are able to achieve 5 METS without cardiac limitations. Therefore, based on ACC/AHA guidelines, the patient would be at acceptable risk for the planned procedure without further cardiovascular testing. The patient was advised that if he develops new symptoms prior to surgery to contact our office to arrange for a follow-up visit, and he verbalized understanding.   The patient was advised that if he develops new symptoms prior to surgery to contact our office to arrange for a follow-up visit, and he verbalized understanding.  Patient can hold ASA 81 mg 7 days prior to procedure.  Time:   Today, I have spent 12 minutes with the patient with telehealth technology discussing medical history, symptoms, and management plan.     Napoleon Form, Leodis Rains, NP  02/21/2024, 7:16 AM

## 2024-02-24 DIAGNOSIS — M5416 Radiculopathy, lumbar region: Secondary | ICD-10-CM | POA: Diagnosis not present

## 2024-02-24 DIAGNOSIS — M48062 Spinal stenosis, lumbar region with neurogenic claudication: Secondary | ICD-10-CM | POA: Diagnosis not present

## 2024-02-24 DIAGNOSIS — M48061 Spinal stenosis, lumbar region without neurogenic claudication: Secondary | ICD-10-CM | POA: Diagnosis not present

## 2024-03-12 NOTE — Telephone Encounter (Signed)
 Ronnie Vazquez

## 2024-04-04 DIAGNOSIS — R3915 Urgency of urination: Secondary | ICD-10-CM | POA: Diagnosis not present

## 2024-04-04 DIAGNOSIS — R3121 Asymptomatic microscopic hematuria: Secondary | ICD-10-CM | POA: Diagnosis not present

## 2024-04-04 DIAGNOSIS — N401 Enlarged prostate with lower urinary tract symptoms: Secondary | ICD-10-CM | POA: Diagnosis not present

## 2024-04-04 DIAGNOSIS — R350 Frequency of micturition: Secondary | ICD-10-CM | POA: Diagnosis not present

## 2024-04-25 DIAGNOSIS — M5416 Radiculopathy, lumbar region: Secondary | ICD-10-CM | POA: Diagnosis not present

## 2024-05-01 DIAGNOSIS — M5416 Radiculopathy, lumbar region: Secondary | ICD-10-CM | POA: Diagnosis not present

## 2024-05-03 DIAGNOSIS — M5416 Radiculopathy, lumbar region: Secondary | ICD-10-CM | POA: Diagnosis not present

## 2024-05-08 DIAGNOSIS — M5416 Radiculopathy, lumbar region: Secondary | ICD-10-CM | POA: Diagnosis not present

## 2024-05-10 DIAGNOSIS — M5416 Radiculopathy, lumbar region: Secondary | ICD-10-CM | POA: Diagnosis not present

## 2024-05-14 ENCOUNTER — Ambulatory Visit: Payer: Medicare Other

## 2024-05-14 VITALS — BP 120/64 | HR 67 | Temp 98.1°F | Ht 70.0 in | Wt 185.6 lb

## 2024-05-14 DIAGNOSIS — Z0000 Encounter for general adult medical examination without abnormal findings: Secondary | ICD-10-CM

## 2024-05-14 NOTE — Patient Instructions (Addendum)
 Mr. Golphin , Thank you for taking time out of your busy schedule to complete your Annual Wellness Visit with me. I enjoyed our conversation and look forward to speaking with you again next year. I, as well as your care team,  appreciate your ongoing commitment to your health goals. Please review the following plan we discussed and let me know if I can assist you in the future. Your Game plan/ To Do List    Referrals: If you haven't heard from the office you've been referred to, please reach out to them at the phone provided.   Follow up Visits: Next Medicare AWV with our clinical staff: 05/22/25 @ 9:20a   Have you seen your provider in the last 6 months (3 months if uncontrolled diabetes)? Yes12/12/24 Next Office Visit with your provider:   Clinician Recommendations:  Aim for 30 minutes of exercise or brisk walking, 6-8 glasses of water, and 5 servings of fruits and vegetables each day.       This is a list of the screening recommended for you and due dates:  Health Maintenance  Topic Date Due   Zoster (Shingles) Vaccine (1 of 2) 11/27/1998   DTaP/Tdap/Td vaccine (2 - Td or Tdap) 05/28/2019   COVID-19 Vaccine (4 - 2024-25 season) 08/28/2023   Colon Cancer Screening  06/18/2024   Medicare Annual Wellness Visit  05/14/2025   Pneumonia Vaccine  Completed   Hepatitis C Screening  Completed   HPV Vaccine  Aged Out   Meningitis B Vaccine  Aged Out   Flu Shot  Discontinued    Advanced directives: (Copy Requested) Please bring a copy of your health care power of attorney and living will to the office to be added to your chart at your convenience. You can mail to [facility] [address] [place], [place] [address] or email to [email or web address] Advance Care Planning is important because it:  [x]  Makes sure you receive the medical care that is consistent with your values, goals, and preferences  [x]  It provides guidance to your family and loved ones and reduces  their decisional burden about whether or not they are making the right decisions based on your wishes.  Follow the link provided in your after visit summary or read over the paperwork we have mailed to you to help you started getting your Advance Directives in place. If you need assistance in completing these, please reach out to us  so that we can help you!  See attachments for Preventive Care and Fall Prevention Tips.

## 2024-05-14 NOTE — Progress Notes (Addendum)
 f  Subjective:   Ronnie Vazquez is a 78 y.o. who presents for a Medicare Wellness preventive visit.  As a reminder, Annual Wellness Visits don't include a physical exam, and some assessments may be limited, especially if this visit is performed virtually. We may recommend an in-person follow-up visit with your provider if needed.  Visit Complete: In person    Persons Participating in Visit: Patient.  AWV Questionnaire: No: Patient Medicare AWV questionnaire was not completed prior to this visit.  Cardiac Risk Factors include: advanced age (>60men, >46 women);male gender     Objective:     Today's Vitals   05/14/24 0956  BP: 120/64  Pulse: 67  Temp: 98.1 F (36.7 C)  TempSrc: Oral  SpO2: 95%  Weight: 185 lb 9.6 oz (84.2 kg)  Height: 5\' 10"  (1.778 m)   Body mass index is 26.63 kg/m.     05/14/2024   10:23 AM 05/06/2023   10:19 AM 04/19/2022   10:55 AM 04/16/2021   12:52 PM 10/24/2020    6:27 AM 10/06/2018   12:12 AM 08/29/2018    6:28 AM  Advanced Directives  Does Patient Have a Medical Advance Directive? Yes Yes Yes Yes Yes No Yes  Type of Estate agent of [place];Living will Healthcare Power of [place];Living will Healthcare Power of [place];Living will Healthcare Power of [place];Living will Healthcare Power of [place];Living will  Healthcare Power of [place];Living will  Does patient want to make changes to medical advance directive?   No - Patient declined No - Patient declined No - Patient declined  No - Patient declined  Copy of Healthcare Power of Attorney in Chart? No - copy requested No - copy requested No - copy requested No - copy requested No - copy requested  No - copy requested    Current Medications (verified) Outpatient Encounter Medications as of 05/14/2024  Medication Sig   amLODipine  (NORVASC ) 5 MG tablet Take 1 tablet (5 mg total) by mouth daily.   aspirin  EC 81 MG tablet Take 81 mg by mouth. Only takes occasionally Swallow  whole.   ezetimibe  (ZETIA ) 10 MG tablet Take 1 tablet (10 mg total) by mouth daily.   ketoconazole  (NIZORAL ) 2 % cream Apply 1 Application topically 2 (two) times daily.   methocarbamol  (ROBAXIN ) 500 MG tablet TAKE ONE TABLET BY MOUTH EVERY 6 HOURS AS NEEDED FOR MUSCLE SPASMS   nitroGLYCERIN  (NITROSTAT ) 0.4 MG SL tablet Place 1 tablet (0.4 mg total) under the tongue every 5 (five) minutes as needed for chest pain.   pantoprazole  (PROTONIX ) 20 MG tablet TAKE 1 TABLET BY MOUTH DAILY 30 mins before food   pantoprazole  (PROTONIX ) 40 MG tablet TAKE 1 TABLET BY MOUTH DAILY   Facility-Administered Encounter Medications as of 05/14/2024  Medication   regadenoson  (LEXISCAN ) injection SOLN 0.4 mg    Allergies (verified) Repatha  [evolocumab ], Bempedoic acid , Crestor  [rosuvastatin  calcium ], and Statins   History: Past Medical History:  Diagnosis Date   Arthritis    bilateral feet   Atrial flutter (HCC)    Echo 9/21: EF 60-65, no RWMA, normal RVSF, trivial MR, borderline dilation of aorta at sinus of Valsalva (37 mm), mild LAE, RVSP 26.8   Chicken pox    hx   Coronary artery disease    08/29/18 PCI/DES with bifurcation from LAD into 1st diag.    GERD (gastroesophageal reflux disease)    with certain foods/on OTC meds   Hyperlipidemia    on meds   Left shoulder pain  02/02/2013   Microscopic hematuria 02/02/2013   Vision loss of left eye    Past Surgical History:  Procedure Laterality Date   A-FLUTTER ABLATION N/A 10/24/2020   Procedure: A-FLUTTER ABLATION;  Surgeon: Verona Goodwill, MD;  Location: [facility] INVASIVE CV LAB;  Service: Cardiovascular;  Laterality: N/A;   COLONOSCOPY  10/2017   MS-suprep(good)-SSA/TICS/hems/TA's-3 yr recall   CORONARY BALLOON ANGIOPLASTY N/A 08/29/2018   Procedure: CORONARY BALLOON ANGIOPLASTY;  Surgeon: Arnoldo Lapping, MD;  Location: [facility] INVASIVE CV LAB;  Service: Cardiovascular;  Laterality: N/A;   CORONARY STENT INTERVENTION N/A 08/29/2018   Procedure: CORONARY STENT  INTERVENTION;  Surgeon: Arnoldo Lapping, MD;  Location: [facility] INVASIVE CV LAB;  Service: Cardiovascular;  Laterality: N/A;   ESOPHAGEAL MANOMETRY N/A 10/08/2020   Procedure: ESOPHAGEAL MANOMETRY (EM);  Surgeon: Asencion Blacksmith, MD;  Location: [facility] ENDOSCOPY;  Service: Endoscopy;  Laterality: N/A;   LEFT HEART CATH AND CORONARY ANGIOGRAPHY N/A 08/08/2018   Procedure: LEFT HEART CATH AND CORONARY ANGIOGRAPHY;  Surgeon: Avanell Leigh, MD;  Location: [facility] INVASIVE CV LAB;  Service: Cardiovascular;  Laterality: N/A;   POLYPECTOMY  10/2017   SSA/TA's   TONSILLECTOMY AND ADENOIDECTOMY  1959   WISDOM TOOTH EXTRACTION  1975   Family History  Problem Relation Age of Onset   Prostate cancer Father    Arthritis Mother    Arthritis/Rheumatoid Brother    Colon cancer Neg Hx    Esophageal cancer Neg Hx    Pancreatic cancer Neg Hx    Rectal cancer Neg Hx    Stomach cancer Neg Hx    Heart disease Neg Hx    Colon polyps Neg Hx    Social History   Socioeconomic History   Marital status: Married    Spouse name: Not on file   Number of children: 2   Years of education: Not on file   Highest education level: Bachelor's degree (e.g., BA, AB, BS)  Occupational History   Occupation: Art gallery manager - retired  Tobacco Use   Smoking status: Never   Smokeless tobacco: Never  Vaping Use   Vaping status: Never Used  Substance and Sexual Activity   Alcohol use: Yes    Alcohol/week: 1.0 standard drink of alcohol    Types: 1 Standard drinks or equivalent per week    Comment: one glass or so a week   Drug use: No   Sexual activity: Not on file  Other Topics Concern   Not on file  Social History Narrative   Work or School: Biomedical engineer, 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      Home Situation: lives with wife      Spiritual Beliefs: none      Lifestyle: exercises 3 times per week               Social Drivers of Health   Financial Resource Strain: Low Risk  (05/14/2024)   Overall Financial Resource  Strain (CARDIA)    Difficulty of Paying Living Expenses: Not hard at all  Food Insecurity: No Food Insecurity (05/14/2024)   Hunger Vital Sign    Worried About Running Out of Food in the Last Year: Never true    Ran Out of Food in the Last Year: Never true  Transportation Needs: No Transportation Needs (05/14/2024)   PRAPARE - Administrator, Civil Service (Medical): No    Lack of Transportation (Non-Medical): No  Physical Activity: Sufficiently Active (05/14/2024)   Exercise Vital Sign    Days of  Exercise per Week: 7 days    Minutes of Exercise per Session: 30 min  Stress: No Stress Concern Present (05/14/2024)   [employer] of Occupational Health - Occupational Stress Questionnaire    Feeling of Stress : Not at all  Social Connections: Moderately Integrated (05/14/2024)   Social Connection and Isolation Panel [NHANES]    Frequency of Communication with Friends and Family: More than three times a week    Frequency of Social Gatherings with Friends and Family: More than three times a week    Attends Religious Services: Never    Database administrator or Organizations: Yes    Attends Banker Meetings: Never    Marital Status: Married    Tobacco Counseling Counseling given: Not Answered    Clinical Intake:  Pre-visit preparation completed: Yes  Pain : No/denies pain     BMI - recorded: 26.63 Nutritional Status: BMI 25 -29 Overweight Nutritional Risks: None Diabetes: No  Lab Results  Component Value Date   HGBA1C 5.8 10/12/2023   HGBA1C 5.4 08/10/2018   HGBA1C 5.5 08/12/2017     How often do you need to have someone help you when you read instructions, pamphlets, or other written materials from your doctor or pharmacy?: 1 - Never  Interpreter Needed?: No  Information entered by :: Farris Hong LPN   Activities of Daily Living     05/14/2024   10:22 AM  In your present state of health, do you have any difficulty performing the  following activities:  Hearing? 1  Comment Wears Hering Aids  Vision? 0  Difficulty concentrating or making decisions? 0  Walking or climbing stairs? 0  Dressing or bathing? 0  Doing errands, shopping? 0  Preparing Food and eating ? N  Using the Toilet? N  In the past six months, have you accidently leaked urine? N  Do you have problems with loss of bowel control? N  Managing your Medications? N  Managing your Finances? N  Housekeeping or managing your Housekeeping? N    Patient Care Team: Aida House, MD as PCP - General (Family Medicine) Arnoldo Lapping, MD as PCP - Cardiology (Cardiology) Rochelle Chu Otelia Blew, MD as Consulting Physician (Neurosurgery)  Indicate any recent Medical Services you may have received from other than [facility] providers in the past year (date may be approximate).     Assessment:    This is a routine wellness examination for [place].  Hearing/Vision screen Hearing Screening - Comments:: Wears Hearing Aids Vision Screening - Comments:: - up to date with routine eye exams with Deferred    Goals Addressed               This Visit's Progress     Increase physical activity (pt-stated)        Get more active.       Depression Screen     05/14/2024    9:58 AM 06/06/2023    2:56 PM 05/06/2023   10:01 AM 04/19/2022   10:51 AM 03/01/2022    9:08 AM 11/30/2021   12:47 PM 04/16/2021   12:57 PM  PHQ 2/9 Scores  PHQ - 2 Score 0 0 0 0 0 0 0  PHQ- 9 Score    0 0 0     Fall Risk     05/14/2024   10:23 AM 05/06/2023   10:17 AM 05/05/2023    3:54 PM 02/10/2023   11:34 AM 02/08/2023    5:02 PM  Fall Risk  Falls in the past year? 0 1 1 1 1   Number falls in past yr: 0 0 0 0 0  Injury with Fall? 0 1 1 1 1   Comment  Rt hip injury. Followed by medical attention.     Risk for fall due to : No Fall Risks No Fall Risks  No Fall Risks   Follow up Falls prevention discussed;Falls evaluation completed Falls prevention discussed  Falls evaluation completed      MEDICARE RISK AT HOME:  Medicare Risk at Home Any stairs in or around the home?: Yes If so, are there any without handrails?: No Home free of loose throw rugs in walkways, pet beds, electrical cords, etc?: Yes Adequate lighting in your home to reduce risk of falls?: Yes Life alert?: No Use of a cane, walker or w/c?: No Grab bars in the bathroom?: No Shower chair or bench in shower?: Yes Elevated toilet seat or a handicapped toilet?: Yes  TIMED UP AND GO:  Was the test performed?  Yes  Gait steady fast without use of device.  Cognitive Function: 6CIT completed        05/14/2024   10:23 AM 05/06/2023   10:19 AM 04/19/2022   10:56 AM  6CIT Screen  What Year? 0 points 0 points 0 points  What month? 0 points 0 points 0 points  What time? 0 points 0 points 0 points  Count back from 20 0 points 0 points 0 points  Months in reverse 0 points 0 points 0 points  Repeat phrase 0 points 0 points 0 points  Total Score 0 points 0 points 0 points    Immunizations Immunization History  Administered Date(s) Administered   Influenza, High Dose Seasonal PF 12/01/2015, 10/27/2019   PFIZER(Purple Top)SARS-COV-2 Vaccination 02/19/2020, 03/11/2020   Pfizer Covid-19 Vaccine Bivalent Booster 21yrs & up 11/02/2022   Pneumococcal Conjugate-13 12/01/2015   Pneumococcal Polysaccharide-23 08/12/2017   Tdap 05/27/2009   Zoster, Live 08/24/2013    Screening Tests Health Maintenance  Topic Date Due   Zoster Vaccines- Shingrix (1 of 2) 11/27/1998   DTaP/Tdap/Td (2 - Td or Tdap) 05/28/2019   COVID-19 Vaccine (4 - 2024-25 season) 08/28/2023   Colonoscopy  06/18/2024   Medicare Annual Wellness (AWV)  05/14/2025   Pneumonia Vaccine 36+ Years old  Completed   Hepatitis C Screening  Completed   HPV VACCINES  Aged Out   Meningococcal B Vaccine  Aged Out   INFLUENZA VACCINE  Discontinued    Health Maintenance  Health Maintenance Due  Topic Date Due   Zoster Vaccines- Shingrix (1 of 2)  11/27/1998   DTaP/Tdap/Td (2 - Td or Tdap) 05/28/2019   COVID-19 Vaccine (4 - 2024-25 season) 08/28/2023   Colonoscopy  06/18/2024   Health Maintenance Items Addressed: Colonoscopy Deferred  Additional Screening:  Vision Screening: Recommended annual ophthalmology exams for early detection of glaucoma and other disorders of the eye.  Dental Screening: Recommended annual dental exams for proper oral hygiene  Community Resource Referral / Chronic Care Management: CRR required this visit?  No   CCM required this visit?  No   Plan:    I have personally reviewed and noted the following in the patient's chart:   Medical and social history Use of alcohol, tobacco or illicit drugs  Current medications and supplements including opioid prescriptions. Patient is not currently taking opioid prescriptions. Functional ability and status Nutritional status Physical activity Advanced directives List of other physicians Hospitalizations, surgeries, and ER visits in previous 12 months  Vitals Screenings to include cognitive, depression, and falls Referrals and appointments  In addition, I have reviewed and discussed with patient certain preventive protocols, quality metrics, and best practice recommendations. A written personalized care plan for preventive services as well as general preventive health recommendations were provided to patient.   Dewayne Ford, LPN   1/61/0960   After Visit Summary: (In Person-Printed) AVS printed and given to the patient  Notes: Nothing significant to report at this time.

## 2024-05-15 ENCOUNTER — Telehealth: Payer: Self-pay | Admitting: Diagnostic Neuroimaging

## 2024-05-15 DIAGNOSIS — M5416 Radiculopathy, lumbar region: Secondary | ICD-10-CM | POA: Diagnosis not present

## 2024-05-15 NOTE — Telephone Encounter (Signed)
 LVM and sent mychart msg informing pt of need to reschedule 05/22/24 appt - MD out

## 2024-05-17 DIAGNOSIS — M5416 Radiculopathy, lumbar region: Secondary | ICD-10-CM | POA: Diagnosis not present

## 2024-05-22 ENCOUNTER — Ambulatory Visit: Admitting: Diagnostic Neuroimaging

## 2024-05-22 DIAGNOSIS — M5416 Radiculopathy, lumbar region: Secondary | ICD-10-CM | POA: Diagnosis not present

## 2024-05-23 ENCOUNTER — Ambulatory Visit (INDEPENDENT_AMBULATORY_CARE_PROVIDER_SITE_OTHER): Admitting: Diagnostic Neuroimaging

## 2024-05-23 ENCOUNTER — Encounter: Payer: Self-pay | Admitting: Diagnostic Neuroimaging

## 2024-05-23 VITALS — BP 128/84 | HR 71 | Ht 70.0 in | Wt 187.6 lb

## 2024-05-23 DIAGNOSIS — R200 Anesthesia of skin: Secondary | ICD-10-CM

## 2024-05-23 NOTE — Patient Instructions (Signed)
  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

## 2024-05-23 NOTE — Progress Notes (Signed)
 [facility]  PATIENT: Ronnie Vazquez DOB: 16-Oct-1946  REFERRING CLINICIAN: Joaquin Mulberry, MD HISTORY FROM: patient  REASON FOR VISIT: new consult   HISTORICAL  CHIEF COMPLAINT:  Chief Complaint  Patient presents with   RM7/NUMBNESS    Pt is here Alone. Pt states that he had fallen a year ago and since then he has been having numbness in his feet. Pt states that he has some tingling in his feet. Pt states that he had a Nerve Conduction Study done and it showed that it wasn't Neuropathy. Pt states that he would like to know what treatment options are out there.     HISTORY OF PRESENT ILLNESS:   78 year old male here for evaluation of numbness and tingling.  December 2023 patient fell down injuring his right hip and leg.  Following this he had some numbness in the bilateral toes and feet and worsening pain in his right hip especially with walking a long distance.  He underwent conservative therapy with orthopedic and neurosurgery clinic at that time.  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  He underwent right L4-5 hemilaminectomy with improvement in pain symptoms in his right hip region.  However he has continued to have numbness and tingling in his toes and feet, which fluctuates and gets worse with increasing back pain.  Had EMG nerve conduction study which showed no signs of neuropathy but did confirm right L4 and L5 radiculopathies.  No numbness and tingling in the fingers or hands.  No neck pain.  No facial numbness or vision issues.  No speech or swallowing difficulties.   REVIEW OF SYSTEMS: Full 14 system review of systems performed and negative with exception of: as per HPI.  ALLERGIES: Allergies  Allergen Reactions   Repatha  [Evolocumab ] Other (See Comments)    Myalgias   Bempedoic Acid  Other (See Comments)    (Nexletol ) muscle pain.   Crestor  [Rosuvastatin  Calcium ] Other (See Comments)    Myalgias  (muscle/joint pain)   Statins     Other reaction(s): Other (See Comments), Other (See Comments) Myalgias (muscle/joint pain) Muscle aches Pt stated "goes to my muscles"     HOME MEDICATIONS: Outpatient Medications Prior to Visit  Medication Sig Dispense Refill   aspirin  EC 81 MG tablet Take 81 mg by mouth. Only takes occasionally Swallow whole.     Cholecalciferol (VITAMIN D3) 1.25 MG (50000 UT) CAPS Take by mouth.     ezetimibe  (ZETIA ) 10 MG tablet Take 1 tablet (10 mg total) by mouth daily. 90 tablet 3   ketoconazole  (NIZORAL ) 2 % cream Apply 1 Application topically 2 (two) times daily. 60 g 0   pantoprazole  (PROTONIX ) 40 MG tablet TAKE 1 TABLET BY MOUTH DAILY 90 tablet 1   amLODipine  (NORVASC ) 5 MG tablet Take 1 tablet (5 mg total) by mouth daily. (Patient not taking: Reported on 05/23/2024) 90 tablet 3   methocarbamol  (ROBAXIN ) 500 MG tablet TAKE ONE TABLET BY MOUTH EVERY 6 HOURS AS NEEDED FOR MUSCLE SPASMS (Patient not taking: Reported on 05/23/2024) 30 tablet 0   nitroGLYCERIN  (NITROSTAT ) 0.4 MG SL tablet Place 1 tablet (0.4 mg total) under the tongue every 5 (five) minutes as needed for chest pain. 25 tablet 6   pantoprazole  (PROTONIX ) 20 MG tablet TAKE 1 TABLET BY MOUTH DAILY 30 mins before food (Patient not taking: Reported on 05/23/2024) 90 tablet 3   Facility-Administered Medications Prior to Visit  Medication Dose Route Frequency Provider Last Rate  Last Admin   regadenoson  (LEXISCAN ) injection SOLN 0.4 mg  0.4 mg Intravenous Once Acharya, Gayatri A, MD        PAST MEDICAL HISTORY: Past Medical History:  Diagnosis Date   Arthritis    bilateral feet   Atrial flutter (HCC)    Echo 9/21: EF 60-65, no RWMA, normal RVSF, trivial MR, borderline dilation of aorta at sinus of Valsalva (37 mm), mild LAE, RVSP 26.8   Chicken pox    hx   Coronary artery disease    08/29/18 PCI/DES with bifurcation from LAD into 1st diag.    GERD (gastroesophageal reflux disease)    with certain  foods/on OTC meds   Hyperlipidemia    on meds   Left shoulder pain 02/02/2013   Microscopic hematuria 02/02/2013   Vision loss of left eye     PAST SURGICAL HISTORY: Past Surgical History:  Procedure Laterality Date   A-FLUTTER ABLATION N/A 10/24/2020   Procedure: A-FLUTTER ABLATION;  Surgeon: Verona Goodwill, MD;  Location: [facility] INVASIVE CV LAB;  Service: Cardiovascular;  Laterality: N/A;   COLONOSCOPY  10/2017   MS-suprep(good)-SSA/TICS/hems/TA's-3 yr recall   CORONARY BALLOON ANGIOPLASTY N/A 08/29/2018   Procedure: CORONARY BALLOON ANGIOPLASTY;  Surgeon: Arnoldo Lapping, MD;  Location: [facility] INVASIVE CV LAB;  Service: Cardiovascular;  Laterality: N/A;   CORONARY STENT INTERVENTION N/A 08/29/2018   Procedure: CORONARY STENT INTERVENTION;  Surgeon: Arnoldo Lapping, MD;  Location: [facility] INVASIVE CV LAB;  Service: Cardiovascular;  Laterality: N/A;   ESOPHAGEAL MANOMETRY N/A 10/08/2020   Procedure: ESOPHAGEAL MANOMETRY (EM);  Surgeon: Asencion Blacksmith, MD;  Location: [facility] ENDOSCOPY;  Service: Endoscopy;  Laterality: N/A;   LEFT HEART CATH AND CORONARY ANGIOGRAPHY N/A 08/08/2018   Procedure: LEFT HEART CATH AND CORONARY ANGIOGRAPHY;  Surgeon: Avanell Leigh, MD;  Location: [facility] INVASIVE CV LAB;  Service: Cardiovascular;  Laterality: N/A;   POLYPECTOMY  10/2017   SSA/TA's   TONSILLECTOMY AND ADENOIDECTOMY  1959   WISDOM TOOTH EXTRACTION  1975    FAMILY HISTORY: Family History  Problem Relation Age of Onset   Prostate cancer Father    Arthritis Mother    Arthritis/Rheumatoid Brother    Colon cancer Neg Hx    Esophageal cancer Neg Hx    Pancreatic cancer Neg Hx    Rectal cancer Neg Hx    Stomach cancer Neg Hx    Heart disease Neg Hx    Colon polyps Neg Hx     SOCIAL HISTORY: Social History   Socioeconomic History   Marital status: Married    Spouse name: Not on file   Number of children: 2   Years of education: Not on file   Highest education level: Bachelor's degree (e.g., BA, AB, BS)   Occupational History   Occupation: Art gallery manager - retired  Tobacco Use   Smoking status: Never   Smokeless tobacco: Never  Vaping Use   Vaping status: Never Used  Substance and Sexual Activity   Alcohol use: Yes    Alcohol/week: 1.0 standard drink of alcohol    Types: 1 Standard drinks or equivalent per week    Comment: one glass or so a week   Drug use: No   Sexual activity: Not on file  Other Topics Concern   Not on file  Social History Narrative   Work or School: Biomedical engineer, 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      Home Situation: lives with wife      Spiritual Beliefs: none  Lifestyle: exercises 3 times per week               Social Drivers of Health   Financial Resource Strain: Low Risk  (05/14/2024)   Overall Financial Resource Strain (CARDIA)    Difficulty of Paying Living Expenses: Not hard at all  Food Insecurity: No Food Insecurity (05/14/2024)   Hunger Vital Sign    Worried About Running Out of Food in the Last Year: Never true    Ran Out of Food in the Last Year: Never true  Transportation Needs: No Transportation Needs (05/14/2024)   PRAPARE - Administrator, Civil Service (Medical): No    Lack of Transportation (Non-Medical): No  Physical Activity: Sufficiently Active (05/14/2024)   Exercise Vital Sign    Days of Exercise per Week: 7 days    Minutes of Exercise per Session: 30 min  Stress: No Stress Concern Present (05/14/2024)   [employer] of Occupational Health - Occupational Stress Questionnaire    Feeling of Stress : Not at all  Social Connections: Moderately Integrated (05/14/2024)   Social Connection and Isolation Panel [NHANES]    Frequency of Communication with Friends and Family: More than three times a week    Frequency of Social Gatherings with Friends and Family: More than three times a week    Attends Religious Services: Never    Database administrator or Organizations: Yes    Attends Banker Meetings: Never     Marital Status: Married  Catering manager Violence: Not At Risk (05/14/2024)   Humiliation, Afraid, Rape, and Kick questionnaire    Fear of Current or Ex-Partner: No    Emotionally Abused: No    Physically Abused: No    Sexually Abused: No     PHYSICAL EXAM  GENERAL EXAM/CONSTITUTIONAL: Vitals:  Vitals:   05/23/24 1443  BP: 128/84  Pulse: 71  Weight: 187 lb 9.6 oz (85.1 kg)  Height: 5\' 10"  (1.778 m)   Body mass index is 26.92 kg/m. Wt Readings from Last 3 Encounters:  05/23/24 187 lb 9.6 oz (85.1 kg)  05/14/24 185 lb 9.6 oz (84.2 kg)  11/28/23 193 lb 12.8 oz (87.9 kg)   Patient is in no distress; well developed, nourished and groomed; neck is supple  CARDIOVASCULAR: Examination of carotid arteries is normal; no carotid bruits Regular rate and rhythm, no murmurs Examination of peripheral vascular system by observation and palpation is normal  EYES: Ophthalmoscopic exam of optic discs and posterior segments is normal; no papilledema or hemorrhages No results found.  MUSCULOSKELETAL: Gait, strength, tone, movements noted in Neurologic exam below  NEUROLOGIC: MENTAL STATUS:      No data to display         awake, alert, oriented to person, place and time recent and remote memory intact normal attention and concentration language fluent, comprehension intact, naming intact fund of knowledge appropriate  CRANIAL NERVE:  2nd - no papilledema on fundoscopic exam 2nd, 3rd, 4th, 6th - pupils RIGHT 3-->2; LEFT MIN REACTION; visual fields full to confrontation, extraocular muscles intact, no nystagmus 5th - facial sensation symmetric 7th - facial strength symmetric 8th - hearing intact 9th - palate elevates symmetrically, uvula midline 11th - shoulder shrug symmetric 12th - tongue protrusion midline  MOTOR:  normal bulk and tone, full strength in the BUE, BLE  SENSORY:  normal and symmetric to light touch, pinprick, temperature, vibration; MINIMAL DECR  PP IN RIGHT FOOT  COORDINATION:  finger-nose-finger, fine finger movements  normal  REFLEXES:  deep tendon reflexes PRESENT and symmetric; TRACE AT ANKLES  GAIT/STATION:  narrow based gait     DIAGNOSTIC DATA (LABS, IMAGING, TESTING) - I reviewed patient records, labs, notes, testing and imaging myself where available.  Lab Results  Component Value Date   WBC 5.9 02/01/2022   HGB 14.5 02/01/2022   HCT 42.8 02/01/2022   MCV 96 02/01/2022   PLT 188 02/01/2022      Component Value Date/Time   NA 139 10/12/2023 1333   NA 140 05/25/2023 0950   K 4.5 10/12/2023 1333   CL 103 10/12/2023 1333   CO2 27 10/12/2023 1333   GLUCOSE 85 10/12/2023 1333   BUN 28 (H) 10/12/2023 1333   BUN 22 05/25/2023 0950   CREATININE 1.36 10/12/2023 1333   CREATININE 1.43 (H) 10/13/2020 0854   CALCIUM  9.8 10/12/2023 1333   PROT 6.6 10/12/2023 1333   ALBUMIN 4.4 05/04/2021 0933   AST 19 05/04/2021 0933   ALT 18 05/04/2021 0933   ALKPHOS 53 05/04/2021 0933   BILITOT 0.4 05/04/2021 0933   GFRNONAA 50 (L) 10/21/2020 1100   GFRNONAA 60 12/01/2015 0954   GFRAA 58 (L) 10/21/2020 1100   GFRAA 70 12/01/2015 0954   Lab Results  Component Value Date   CHOL 196 05/25/2023   HDL 48 05/25/2023   LDLCALC 122 (H) 05/25/2023   LDLDIRECT 117.0 08/12/2017   TRIG 147 05/25/2023   CHOLHDL 4.1 05/25/2023   Lab Results  Component Value Date   HGBA1C 5.8 10/12/2023   Lab Results  Component Value Date   VITAMINB12 >1501 (H) 10/12/2023   Lab Results  Component Value Date   TSH 1.980 05/25/2023    10/13/2023 EMG/NCS - no neuropathy noted - right L5, right L5 radiculopathy  04/12/23 MRI lumbar spine [I reviewed images myself and agree with interpretation. -VRP]  1. Lumbar spondylosis and degenerative disc disease, causing moderate to severe impingement at L4-5; mild impingement at L2-3, L3-4, and L5-S1. Impingement at L2-3, L4-5, and L5-S1 is mildly worsened from prior. 2. Dextroconvex lumbar  scoliosis with rotary component. 3. 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  02/08/2024 MRI lumbar spine [I reviewed images myself and agree with interpretation. -VRP]  1. Degenerative changes of the lumbar spine as above, slightly increased since 2024. 2. Disc bulge at L2-3 with increased narrowing of the left lateral recess and impingement upon the traversing left L3 nerve root. 3. Disc bulge at L4-5 contributing to mild-to-moderate spinal canal stenosis and increased narrowing of the right lateral recess with possible impingement upon the traversing right L5 nerve root. New ligamentum flavum cyst at L4-5 contributes to spinal canal narrowing at this level. 4. Multilevel foraminal stenosis, greatest and severe on the right at L4-5, slightly increased from prior. 5. Redemonstrated impingement upon the extraforaminal left L2 and L3 nerve roots. 6. Multilevel degenerative endplate changes with associated edema.    ASSESSMENT AND PLAN  78 y.o. year old male here with:  Dx:  1. Numbness in feet     PLAN:  MILD NUMBNESS IN FEET (symptoms started post-traumatically after fall in Dec 2023; also with right L4-5, right L5-S1 foraminal stenosis; s/p right L4-5 hemilaminectomy) - no evidence of underlying large fiber or systemic / progressive neuropathy - monitor for now; gradually increase exercise as tolerated  Return for return to referring provider, pending if symptoms worsen or fail to improve.    Omega Bible, MD 05/23/2024, 3:41 PM Certified in Neurology,  Neurophysiology and Neuroimaging  [facility] [address] [place], [place] [address] [phone]

## 2024-05-24 DIAGNOSIS — M5416 Radiculopathy, lumbar region: Secondary | ICD-10-CM | POA: Diagnosis not present

## 2024-05-29 DIAGNOSIS — M5416 Radiculopathy, lumbar region: Secondary | ICD-10-CM | POA: Diagnosis not present

## 2024-05-31 DIAGNOSIS — H93293 Other abnormal auditory perceptions, bilateral: Secondary | ICD-10-CM | POA: Diagnosis not present

## 2024-05-31 DIAGNOSIS — H938X3 Other specified disorders of ear, bilateral: Secondary | ICD-10-CM | POA: Diagnosis not present

## 2024-05-31 DIAGNOSIS — M5416 Radiculopathy, lumbar region: Secondary | ICD-10-CM | POA: Diagnosis not present

## 2024-05-31 DIAGNOSIS — I10 Essential (primary) hypertension: Secondary | ICD-10-CM | POA: Insufficient documentation

## 2024-05-31 NOTE — Progress Notes (Unsigned)
 OFFICE NOTE Date:  06/01/2024  ID:  Virgin Grill, DOB 1946-10-10, MRN [id number] PCP: Aida House, MD  [facility] Providers Cardiologist:  Arnoldo Lapping, MD     Patient Profile:     Coronary artery disease S/p complex bifurcational PCI with 3 x 30 mm DES to the pLAD and POBA to the D1 (thru stent struts) 08/2018 Clopidogrel  ? to Ticagrelor  due to tinnitus (no resolution) On ASA alone after 6 mos MPI 07/30/2019:EF 60, no ischemia or scar, low risk  TTE 09/12/2020: EF 60-65, no RWMA, normal PASP, trivial MR, SoV 37 mm MPI 12/13/2022: No ischemia or infarction, EF 61, low risk TTE 06/16/2023: EF 55-60, no RWMA, mild concentric LVH, GR 1 DD, normal RVSF, moderate LAE, mild RAE, trivial MR, AV sclerosis, aortic root 38 mm, ascending aorta 39 mm Atrial Flutter CHA2DS2-VASc=2 (CAD, age x 1)  S/p CTI ablation 09/2020 (Dr. Rodolfo Clan) Monitor 05/2023: No A-fib/flutter Hyperlipidemia  Intol of statins, PCSK9i, Bempedoic Acid  On Ezetimibe   Tinnitus  ?? Hx of TIA in 2014 >> visual disturbance; eval by Neuro >> No TIA Carotid Artery Dz  US  8/19: Bilateral ICA 1-39       Discussed the use of AI scribe software for clinical note transcription with the patient, who gave verbal consent to proceed. History of Present Illness NASIF BOS is a 78 y.o. male who returns for follow-up of CAD, atrial flutter.  He was last seen by Dr. Arlester Ladd in December 2024.  Amlodipine  was added for elevated blood pressure.  He is here alone.  He stopped taking amlodipine  after couple of doses.  He notes his blood pressure was elevated at last visit after receiving epidural steroid injection for his back.  He has since had back surgery.  His blood pressure readings at home have been around 110/72.  He has not had chest symptoms such as pressure, heaviness, or tightness, and no shortness of breath or syncope.  He has not had palpitations.     ROS-See HPI    Studies Reviewed:  EKG  Interpretation Date/Time:  Friday June 01 2024 10:04:42 EDT Ventricular Rate:  71 PR Interval:  166 QRS Duration:  88 QT Interval:  398 QTC Calculation: 432 R Axis:   -6  Text Interpretation: Normal sinus rhythm Normal ECG Confirmed by Marlyse Single [phone]) on 06/01/2024 10:12:23 AM   Results LABS LDL: 122 (04/2023)  Risk Assessment/Calculations:          Physical Exam:  VS:  BP 110/72 (BP Location: Left Arm, Patient Position: Sitting, Cuff Size: Normal)   Pulse 71   Ht 5\' 10"  (1.778 m)   Wt 185 lb (83.9 kg)   BMI 26.54 kg/m    Wt Readings from Last 3 Encounters:  06/01/24 185 lb (83.9 kg)  05/23/24 187 lb 9.6 oz (85.1 kg)  05/14/24 185 lb 9.6 oz (84.2 kg)    Constitutional:      Appearance: Healthy appearance. Not in distress.  Neck:     Vascular: JVD normal.  Pulmonary:     Breath sounds: Normal breath sounds. No wheezing. No rales.  Cardiovascular:     Normal rate. Regular rhythm.     Murmurs: There is no murmur.  Edema:    Peripheral edema absent.  Abdominal:     Palpations: Abdomen is soft.        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 Recent nuclear stress test in December 2023 was negative for ischemia. Echocardiogram in June 2024 showed EF 55-60% with grade 1 diastolic dysfunction. Currently asymptomatic with no chest symptoms suggestive of angina. - Continue aspirin  81 mg daily - Continue Zetia  10 mg daily - Continue nitroglycerin  as needed Typical atrial flutter (HCC) Atrial flutter status post CTI ablation in October 2021. Currently maintaining sinus rhythm with no recurrence of symptoms.  Essential hypertension He briefly took amlodipine .  However, his blood pressure returned to normal after epidural steroid injections.  Blood pressure remains optimal without medical therapy. Mixed  hyperlipidemia Hyperlipidemia with intolerance to statins, PCSK9 inhibitors, and bempedoic acid . Recent LDL in May 2024 was 122, above the goal of less than 70. No other pharmacological options available due to intolerance. - Continue Zetia  10 mg daily - Continue dietary modifications      Dispo:  Return in about 1 year (around 06/01/2025) for Routine Follow Up, w/ Dr. Arlester Ladd, or Marlyse Single, PA-C. Signed, Marlyse Single, PA-C

## 2024-06-01 ENCOUNTER — Encounter: Payer: Self-pay | Admitting: Physician Assistant

## 2024-06-01 ENCOUNTER — Ambulatory Visit: Attending: Physician Assistant | Admitting: Physician Assistant

## 2024-06-01 VITALS — BP 110/72 | HR 71 | Ht 70.0 in | Wt 185.0 lb

## 2024-06-01 DIAGNOSIS — I483 Typical atrial flutter: Secondary | ICD-10-CM | POA: Insufficient documentation

## 2024-06-01 DIAGNOSIS — I25118 Atherosclerotic heart disease of native coronary artery with other forms of angina pectoris: Secondary | ICD-10-CM | POA: Diagnosis not present

## 2024-06-01 DIAGNOSIS — E782 Mixed hyperlipidemia: Secondary | ICD-10-CM | POA: Diagnosis not present

## 2024-06-01 DIAGNOSIS — I10 Essential (primary) hypertension: Secondary | ICD-10-CM | POA: Insufficient documentation

## 2024-06-01 NOTE — Assessment & Plan Note (Signed)
 Hyperlipidemia with intolerance to statins, PCSK9 inhibitors, and bempedoic acid . Recent LDL in May 2024 was 122, above the goal of less than 70. No other pharmacological options available due to intolerance. - Continue Zetia  10 mg daily - Continue dietary modifications

## 2024-06-01 NOTE — Assessment & Plan Note (Signed)
 Atrial flutter status post CTI ablation in October 2021. Currently maintaining sinus rhythm with no recurrence of symptoms.

## 2024-06-01 NOTE — Assessment & Plan Note (Addendum)
 History of complex bifurcational PCI with DES to the LAD and angioplasty to D1 in September 2019. Recent nuclear stress test in December 2023 was negative for ischemia. Echocardiogram in June 2024 showed EF 55-60% with grade 1 diastolic dysfunction. Currently asymptomatic with no chest symptoms suggestive of angina. - Continue aspirin  81 mg daily - Continue Zetia  10 mg daily - Continue nitroglycerin  as needed

## 2024-06-01 NOTE — Patient Instructions (Signed)
 Medication Instructions:  NO CHANGES   Lab Work: NONE   Testing/Procedures: NONE  Follow-Up: At [facility], you and your health needs are our priority.  As part of our continuing mission to provide you with exceptional heart care, our providers are all part of one team.  This team includes your primary Cardiologist (physician) and Advanced Practice Providers or APPs (Physician Assistants and Nurse Practitioners) who all work together to provide you with the care you need, when you need it.  Your next appointment:   1 YEAR  Provider:   Arnoldo Lapping, MD

## 2024-06-01 NOTE — Assessment & Plan Note (Signed)
 He briefly took amlodipine .  However, his blood pressure returned to normal after epidural steroid injections.  Blood pressure remains optimal without medical therapy.

## 2024-06-05 DIAGNOSIS — L821 Other seborrheic keratosis: Secondary | ICD-10-CM | POA: Diagnosis not present

## 2024-06-05 DIAGNOSIS — L570 Actinic keratosis: Secondary | ICD-10-CM | POA: Diagnosis not present

## 2024-06-05 DIAGNOSIS — L814 Other melanin hyperpigmentation: Secondary | ICD-10-CM | POA: Diagnosis not present

## 2024-06-07 DIAGNOSIS — M5416 Radiculopathy, lumbar region: Secondary | ICD-10-CM | POA: Diagnosis not present

## 2024-06-12 DIAGNOSIS — M5416 Radiculopathy, lumbar region: Secondary | ICD-10-CM | POA: Diagnosis not present

## 2024-06-14 DIAGNOSIS — M5416 Radiculopathy, lumbar region: Secondary | ICD-10-CM | POA: Diagnosis not present

## 2024-07-04 ENCOUNTER — Ambulatory Visit (INDEPENDENT_AMBULATORY_CARE_PROVIDER_SITE_OTHER): Admitting: Family Medicine

## 2024-07-04 ENCOUNTER — Encounter: Payer: Self-pay | Admitting: Family Medicine

## 2024-07-04 VITALS — BP 120/60 | HR 60 | Temp 97.5°F | Ht 70.0 in | Wt 186.1 lb

## 2024-07-04 DIAGNOSIS — N400 Enlarged prostate without lower urinary tract symptoms: Secondary | ICD-10-CM | POA: Diagnosis not present

## 2024-07-04 DIAGNOSIS — E559 Vitamin D deficiency, unspecified: Secondary | ICD-10-CM

## 2024-07-04 DIAGNOSIS — E538 Deficiency of other specified B group vitamins: Secondary | ICD-10-CM | POA: Diagnosis not present

## 2024-07-04 DIAGNOSIS — E782 Mixed hyperlipidemia: Secondary | ICD-10-CM | POA: Diagnosis not present

## 2024-07-04 MED ORDER — EZETIMIBE 10 MG PO TABS: 10.0000 mg | ORAL_TABLET | Freq: Every day | ORAL | Status: DC

## 2024-07-04 NOTE — Progress Notes (Signed)
 Established Patient Office Visit  Subjective   Patient ID: Ronnie Vazquez, male    DOB: [birth year]  Age: 78 y.o. MRN: [id number]  Chief Complaint  Patient presents with   Medication Problem    Patient states urologist gave Rx for Rapaflo and he discontinued due to dizziness and low BP, concerned due to side effects he often has with medications, tried taking the Rx at night with no relief    Pt is here to discuss medication that the urologist started on him. He reports that the rapaflo for his BPH caused dizziness and low blood pressure so he stopped taking it. States that he has been reactive to other medications in the past as well, including statins. States that he continues to see his spine specialist and he has gotten a total of 4 spinal injections which caused elevated blood pressure. States that he had back surgery about 4 months ago which he reports did help. He reports that he is somewhat worried about the possibility of prostate cancer, states that his BPH symptoms are not bad, states that he feels like he is able to empty his bladder relatively well.   Pt states he continues to have numbness in his feet, states that overall his symptoms have improved since his surgery, except for the foot numbness. States he is walking 2 miles per day and he feels relatively well.   I reviewed notes from the cardiologist.    Current Outpatient Medications  Medication Instructions   aspirin  EC 81 mg   Cholecalciferol (VITAMIN D3) 1.25 MG (50000 UT) CAPS Take by mouth.   ezetimibe  (ZETIA ) 10 mg, Oral, Daily   ketoconazole  (NIZORAL ) 2 % cream 1 Application, Topical, 2 times daily   nitroGLYCERIN  (NITROSTAT ) 0.4 mg, Sublingual, Every 5 min PRN   pantoprazole  (PROTONIX ) 40 mg, Daily    Patient Active Problem List   Diagnosis Date Noted   BPH without obstruction/lower urinary tract symptoms 07/04/2024   Essential hypertension 05/31/2024   Osteoarthritis of toe joint, right 06/06/2023    Hemorrhoids 06/04/2021   Gastritis without bleeding 06/04/2021   Esophageal dysphagia    Typical atrial flutter (HCC) 09/06/2020   B12 deficiency 11/14/2019   Gastroesophageal reflux disease 11/28/2018   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     Objective:     BP 120/60   Pulse 60   Temp (!) 97.5 F (36.4 C) (Oral)   Ht 5' 10 (1.778 m)   Wt 186 lb 1.6 oz (84.4 kg)   SpO2 98%   BMI 26.70 kg/m    Physical Exam Vitals reviewed.  Constitutional:      Appearance: Normal appearance. He is well-groomed and normal weight.  Cardiovascular:     Rate and Rhythm: Normal rate and regular rhythm.     Heart sounds: S1 normal and S2 normal. No murmur heard. Pulmonary:     Effort: Pulmonary effort is normal.     Breath sounds: Normal breath sounds and air entry. No rales.  Musculoskeletal:     Right lower leg: No edema.     Left lower leg: No edema.  Neurological:     General: No focal deficit present.     Mental Status: He is alert  and oriented to person, place, and time.     Gait: Gait is intact.  Psychiatric:        Mood and Affect: Mood and affect normal.      No results found for any visits on 07/04/24.    The 10-year ASCVD risk score (Arnett DK, et al., 2019) is: 25.9%    Assessment & Plan:  BPH without obstruction/lower urinary tract symptoms Assessment & Plan: Adverse reaction (dizziness and low BP) to rapaflo-- I counseled the patient on the risk of prostate cancer in th setting of BPH (does not increase the risk) and we discussed other medications that can be used (finasteride/dutasteride) and the risks/benefits associated with these medications.    Mixed hyperlipidemia -      Lipid panel; Future -     Apolipoprotein B; Future -     Apolipoprotein A-1; Future  B12 deficiency -     Vitamin B12; Future  Mild vitamin D  deficiency -     VITAMIN D  25 Hydroxy (Vit-D Deficiency, Fractures); Future  Other orders -     Ezetimibe ; Take 1 tablet (10 mg total) by mouth daily.   Pt needs his annual lipid panel and follow up on vitamin deficiencies. Reviewed notes from cardiology, he continues on zetia  10 mg daily, has had mulitple adverse reactions to other cholesterol lowering medications. Will check Apo A and Apo B to further assess risk. I spent 30 minutes with patient today reviewing specialists notes, labs, medications, and counseling patient on BPH and weighing the risks/benefits of the other medications.   Return in about 6 months (around 01/04/2025).    Heron CHRISTELLA Sharper, MD

## 2024-07-04 NOTE — Assessment & Plan Note (Signed)
 Adverse reaction (dizziness and low BP) to rapaflo-- I counseled the patient on the risk of prostate cancer in th setting of BPH (does not increase the risk) and we discussed other medications that can be used (finasteride/dutasteride) and the risks/benefits associated with these medications.

## 2024-07-15 ENCOUNTER — Encounter: Payer: Self-pay | Admitting: Family Medicine

## 2024-07-16 ENCOUNTER — Encounter: Payer: Self-pay | Admitting: Internal Medicine

## 2024-07-16 ENCOUNTER — Other Ambulatory Visit (INDEPENDENT_AMBULATORY_CARE_PROVIDER_SITE_OTHER)

## 2024-07-16 ENCOUNTER — Ambulatory Visit (INDEPENDENT_AMBULATORY_CARE_PROVIDER_SITE_OTHER): Admitting: Internal Medicine

## 2024-07-16 ENCOUNTER — Ambulatory Visit: Payer: Self-pay | Admitting: Family Medicine

## 2024-07-16 VITALS — BP 120/78 | HR 70 | Temp 98.0°F | Wt 186.4 lb

## 2024-07-16 DIAGNOSIS — E782 Mixed hyperlipidemia: Secondary | ICD-10-CM | POA: Diagnosis not present

## 2024-07-16 DIAGNOSIS — E559 Vitamin D deficiency, unspecified: Secondary | ICD-10-CM | POA: Diagnosis not present

## 2024-07-16 DIAGNOSIS — W19XXXA Unspecified fall, initial encounter: Secondary | ICD-10-CM | POA: Diagnosis not present

## 2024-07-16 DIAGNOSIS — E538 Deficiency of other specified B group vitamins: Secondary | ICD-10-CM | POA: Diagnosis not present

## 2024-07-16 DIAGNOSIS — M79604 Pain in right leg: Secondary | ICD-10-CM

## 2024-07-16 LAB — LIPID PANEL
Cholesterol: 193 mg/dL (ref 0–200)
HDL: 39.6 mg/dL (ref >39.00)
LDL Cholesterol: 120 mg/dL — ABNORMAL HIGH (ref 0–99)
NonHDL: 153.86
Total CHOL/HDL Ratio: 5
Triglycerides: 169 mg/dL — ABNORMAL HIGH (ref 0.0–149.0)
VLDL: 33.8 mg/dL (ref 0.0–40.0)

## 2024-07-16 LAB — VITAMIN D 25 HYDROXY (VIT D DEFICIENCY, FRACTURES): VITD: 28.67 ng/mL — ABNORMAL LOW (ref 30.00–100.00)

## 2024-07-16 LAB — VITAMIN B12: Vitamin B-12: 380 pg/mL (ref 211–911)

## 2024-07-16 NOTE — Telephone Encounter (Signed)
 Spoke with the patient and informed him a visit with a provider is needed for evaluation prior to x-rays being ordered. Appt scheduled with Dr Theophilus today at 4pm as this is the first available and PCP does not have any openings.  Patient is also coming in this AM for fasting lab appt.

## 2024-07-16 NOTE — Progress Notes (Signed)
 Established Patient Office Visit     CC/Reason for Visit: Mechanical fall, right leg pain  HPI: Ronnie Vazquez is a 78 y.o. male who is coming in today for the above mentioned reasons.  3 days ago he suffered a mechanical fall at the grocery store, fell on his right side.  Was able to stand up and walk without issue, no head injury.  No lacerations.  He developed a bruise on his lateral right thigh that is now starting to fade.  He has pain to that area.  He is able to bear weight without any issues.   Past Medical/Surgical History: Past Medical History:  Diagnosis Date   Arthritis    bilateral feet   Atrial flutter (HCC)    Echo 9/21: EF 60-65, no RWMA, normal RVSF, trivial MR, borderline dilation of aorta at sinus of Valsalva (37 mm), mild LAE, RVSP 26.8   Chicken pox    hx   Coronary artery disease    08/29/18 PCI/DES with bifurcation from LAD into 1st diag.    GERD (gastroesophageal reflux disease)    with certain foods/on OTC meds   Hyperlipidemia    on meds   Left shoulder pain 02/02/2013   Microscopic hematuria 02/02/2013   Vision loss of left eye     Past Surgical History:  Procedure Laterality Date   A-FLUTTER ABLATION N/A 10/24/2020   Procedure: A-FLUTTER ABLATION;  Surgeon: Fernande Elspeth BROCKS, MD;  Location: [facility] INVASIVE CV LAB;  Service: Cardiovascular;  Laterality: N/A;   COLONOSCOPY  10/2017   MS-suprep(good)-SSA/TICS/hems/TA's-3 yr recall   CORONARY BALLOON ANGIOPLASTY N/A 08/29/2018   Procedure: CORONARY BALLOON ANGIOPLASTY;  Surgeon: Wonda Sharper, MD;  Location: [facility] INVASIVE CV LAB;  Service: Cardiovascular;  Laterality: N/A;   CORONARY STENT INTERVENTION N/A 08/29/2018   Procedure: CORONARY STENT INTERVENTION;  Surgeon: Wonda Sharper, MD;  Location: [facility] INVASIVE CV LAB;  Service: Cardiovascular;  Laterality: N/A;   ESOPHAGEAL MANOMETRY N/A 10/08/2020   Procedure: ESOPHAGEAL MANOMETRY (EM);  Surgeon: Aneita Gwendlyn DASEN, MD;  Location: [facility] ENDOSCOPY;  Service:  Endoscopy;  Laterality: N/A;   LEFT HEART CATH AND CORONARY ANGIOGRAPHY N/A 08/08/2018   Procedure: LEFT HEART CATH AND CORONARY ANGIOGRAPHY;  Surgeon: Court Dorn PARAS, MD;  Location: [facility] INVASIVE CV LAB;  Service: Cardiovascular;  Laterality: N/A;   POLYPECTOMY  10/2017   SSA/TA's   TONSILLECTOMY AND ADENOIDECTOMY  1959   WISDOM TOOTH EXTRACTION  1975    Social History:  reports that he has never smoked. He has never used smokeless tobacco. He reports current alcohol use of about 1.0 standard drink of alcohol per week. He reports that he does not use drugs.  Allergies: Allergies  Allergen Reactions   Repatha  [Evolocumab ] Other (See Comments)    Myalgias   Bempedoic Acid  Other (See Comments)    (Nexletol ) muscle pain.   Crestor  [Rosuvastatin  Calcium ] Other (See Comments)    Myalgias (muscle/joint pain)   Statins     Other reaction(s): Other (See Comments), Other (See Comments) Myalgias (muscle/joint pain) Muscle aches Pt stated goes to my muscles     Family History:  Family History  Problem Relation Age of Onset   Prostate cancer Father    Arthritis Mother    Arthritis/Rheumatoid Brother    Colon cancer Neg Hx    Esophageal cancer Neg Hx    Pancreatic cancer Neg Hx    Rectal cancer Neg Hx    Stomach cancer Neg Hx    Heart  disease Neg Hx    Colon polyps Neg Hx      Current Outpatient Medications:    aspirin  EC 81 MG tablet, Take 81 mg by mouth. Only takes occasionally Swallow whole., Disp: , Rfl:    Cholecalciferol (VITAMIN D3) 1.25 MG (50000 UT) CAPS, Take by mouth., Disp: , Rfl:    ezetimibe  (ZETIA ) 10 MG tablet, Take 1 tablet (10 mg total) by mouth daily., Disp: , Rfl:    ketoconazole  (NIZORAL ) 2 % cream, Apply 1 Application topically 2 (two) times daily., Disp: 60 g, Rfl: 0   nitroGLYCERIN  (NITROSTAT ) 0.4 MG SL tablet, Place 1 tablet (0.4 mg total) under the tongue every 5 (five) minutes as needed for chest pain., Disp: 25 tablet, Rfl: 6   pantoprazole   (PROTONIX ) 40 MG tablet, Take 40 mg by mouth daily., Disp: , Rfl:   Review of Systems:  Negative unless indicated in HPI.   Physical Exam: Vitals:   07/16/24 1547  BP: 120/78  Pulse: 70  Temp: 98 F (36.7 C)  TempSrc: Oral  SpO2: 96%  Weight: 186 lb 6.4 oz (84.6 kg)    Body mass index is 26.75 kg/m.    Impression and Plan:  Right leg pain  Fall, initial encounter   - Fractures not suspected as he is able to bear weight and walk is normal.  No x-rays required.  Advised as needed ibuprofen and light to moderate physical activity.  Time spent:21 minutes reviewing chart, interviewing and examining patient and formulating plan of care.     Ronnie Theophilus Andrews, MD [facility]

## 2024-07-17 LAB — APOLIPOPROTEIN A-1: Apolipoprotein A-1: 127 mg/dL (ref 101–178)

## 2024-07-17 LAB — APOLIPOPROTEIN B: Apolipoprotein B: 89 mg/dL (ref <90)

## 2024-07-22 DIAGNOSIS — Z23 Encounter for immunization: Secondary | ICD-10-CM | POA: Diagnosis not present

## 2024-07-24 DIAGNOSIS — Z6826 Body mass index (BMI) 26.0-26.9, adult: Secondary | ICD-10-CM | POA: Diagnosis not present

## 2024-07-24 DIAGNOSIS — M5416 Radiculopathy, lumbar region: Secondary | ICD-10-CM | POA: Diagnosis not present

## 2024-08-02 ENCOUNTER — Telehealth: Admitting: Family Medicine

## 2024-08-02 DIAGNOSIS — N400 Enlarged prostate without lower urinary tract symptoms: Secondary | ICD-10-CM

## 2024-08-02 DIAGNOSIS — E538 Deficiency of other specified B group vitamins: Secondary | ICD-10-CM

## 2024-08-02 DIAGNOSIS — E782 Mixed hyperlipidemia: Secondary | ICD-10-CM

## 2024-08-02 NOTE — Progress Notes (Signed)
 Virtual Medical Office Visit  Patient:  Ronnie Vazquez      Age: 78 y.o.       Sex:  male  Date:   08/02/2024  PCP:    Ozell Heron HERO, MD   Today's Healthcare Provider: Heron HERO Ozell, MD    Assessment/Plan:   Summary assessment:  Mixed hyperlipidemia Overview: Hyperlipidemia  Assessment & Plan: I spent 20 minutes with patient today going over his labs, discussing possible need for further imaging, etc. Since he cannot tolerate any statins and even cannot tolerate injectable medications, will not proceed with further imaging at this time, continue zetia  10 mg daily.    BPH without obstruction/lower urinary tract symptoms Assessment & Plan: Pt wants to try the flomax  again since his dizziness did not improve with stopping the medication, states he already has it at home. Counseled patient on starting this trial and to let me know if his symptoms of frequent urination improve.    B12 deficiency Assessment & Plan: Reviewed labs with patient, he has already started back on his supplement, advised he take it three times per week since his levels were normal.       No follow-ups on file.   He was advised to call the office or go to ER if his condition worsens    Subjective:   Ronnie Vazquez is a 78 y.o. male with PMH significant for: Past Medical History:  Diagnosis Date   Arthritis    bilateral feet   Atrial flutter (HCC)    Echo 9/21: EF 60-65, no RWMA, normal RVSF, trivial MR, borderline dilation of aorta at sinus of Valsalva (37 mm), mild LAE, RVSP 26.8   Chicken pox    hx   Coronary artery disease    08/29/18 PCI/DES with bifurcation from LAD into 1st diag.    GERD (gastroesophageal reflux disease)    with certain foods/on OTC meds   Hyperlipidemia    on meds   Left shoulder pain 02/02/2013   Microscopic hematuria 02/02/2013   Vision loss of left eye      Presenting today with: No chief complaint on file.    He clarifies and reports that his  condition: Patient reports he has some questions about his lab results. He reports that it goes back about a year when he was having spinal injections that he had some blood work done and had a lot of confusion and wanted to go through them.  I spent 20 minutes going over the labs with the patient, explaining each one and counseling him on these.  Pt states that he restarted the B12 vitamins and this seems to be helping his dizziness. Is wondering if he should restart the tamsulosin , we discussed risks/benefits of the medication. Pt states that he has some at home and he will retry it.   He denies having any: Chest pain, dizziness, SOB or other problems.           Objective/Observations  Physical Exam:  Polite and friendly Gen: NAD, resting comfortably Pulm: Normal work of breathing Neuro: Grossly normal, moves all extremities Psych: Normal affect and thought content Problem specific physical exam findings:   No images are attached to the encounter or orders placed in the encounter.    Results: No results found for any visits on 08/02/24.   Recent Results (from the past 2160 hours)  VITAMIN D  25 Hydroxy (Vit-D Deficiency, Fractures)     Status: Abnormal   Collection Time: 07/16/24  8:39 AM  Result Value Ref Range   VITD 28.67 (L) 30.00 - 100.00 ng/mL  Vitamin B12     Status: None   Collection Time: 07/16/24  8:39 AM  Result Value Ref Range   Vitamin B-12 380 211 - 911 pg/mL  Apolipoprotein A-1     Status: None   Collection Time: 07/16/24  8:39 AM  Result Value Ref Range   Apolipoprotein A-1 127 101 - 178 mg/dL  Apolipoprotein B     Status: None   Collection Time: 07/16/24  8:39 AM  Result Value Ref Range   Apolipoprotein B 89 <90 mg/dL    Comment:                          Desirable               < 90                          Borderline High     90 -  99                          High               100 - 130                          Very High               >130       --------------------------------------------------           ASCVD RISK              THERAPEUTIC TARGET            CATEGORY                  APO B (mg/dL)         Very High Risk        <80 (if extreme risk <70)         High Risk             <90         Moderate Risk         <90   Lipid panel     Status: Abnormal   Collection Time: 07/16/24  8:39 AM  Result Value Ref Range   Cholesterol 193 0 - 200 mg/dL    Comment: ATP III Classification       Desirable:  < 200 mg/dL               Borderline High:  200 - 239 mg/dL          High:  > = 759 mg/dL   Triglycerides 830.9 (H) 0.0 - 149.0 mg/dL    Comment: Normal:  <849 mg/dLBorderline High:  150 - 199 mg/dL   HDL 60.39 >60.99 mg/dL   VLDL 66.1 0.0 - 59.9 mg/dL   LDL Cholesterol 879 (H) 0 - 99 mg/dL   Total CHOL/HDL Ratio 5     Comment:                Men          Women1/2 Average Risk     3.4          3.3Average Risk  5.0          4.42X Average Risk          9.6          7.13X Average Risk          15.0          11.0                       NonHDL 153.86     Comment: NOTE:  Non-HDL goal should be 30 mg/dL higher than patient's LDL goal (i.e. LDL goal of < 70 mg/dL, would have non-HDL goal of < 100 mg/dL)           Virtual Visit via Video   I connected with Ronnie Vazquez on 08/02/24 at 11:00 AM EDT by a video enabled telemedicine application and verified that I am speaking with the correct person using two identifiers. The limitations of evaluation and management by telemedicine and the availability of in person appointments were discussed. The patient expressed understanding and agreed to proceed.   Percentage of appointment time on video:  100% Patient location: Home Provider location: [facility] Persons participating in the virtual visit: Myself and Patient

## 2024-08-02 NOTE — Assessment & Plan Note (Signed)
 Reviewed labs with patient, he has already started back on his supplement, advised he take it three times per week since his levels were normal.

## 2024-08-02 NOTE — Assessment & Plan Note (Signed)
 I spent 20 minutes with patient today going over his labs, discussing possible need for further imaging, etc. Since he cannot tolerate any statins and even cannot tolerate injectable medications, will not proceed with further imaging at this time, continue zetia  10 mg daily.

## 2024-08-02 NOTE — Assessment & Plan Note (Signed)
 Pt wants to try the flomax  again since his dizziness did not improve with stopping the medication, states he already has it at home. Counseled patient on starting this trial and to let me know if his symptoms of frequent urination improve.

## 2024-08-07 ENCOUNTER — Telehealth: Payer: Self-pay | Admitting: Physician Assistant

## 2024-08-07 ENCOUNTER — Telehealth: Payer: Self-pay | Admitting: Family Medicine

## 2024-08-07 ENCOUNTER — Telehealth: Payer: Self-pay | Admitting: *Deleted

## 2024-08-07 MED ORDER — AMLODIPINE BESYLATE 5 MG PO TABS: 5.0000 mg | ORAL_TABLET | Freq: Every day | ORAL | 3 refills | Status: DC

## 2024-08-07 NOTE — Telephone Encounter (Signed)
 Spoke to patient . Aware to restart Amlodipine  5 mg  daily per  order. Patient states he still has Amlodipine   Patient voiced understanding. Patient had stated he would start if blood pressure is >150 . RN informed patient to start medication  especially since he is leaving on a trip next week.  Patient voiced understanding. Patient states he will call Monday.

## 2024-08-07 NOTE — Telephone Encounter (Signed)
 Copied from CRM [id number]. Topic: Clinical - Medication Question >> Aug 07, 2024 11:34 AM Gennette ORN wrote: Reason for CRM: Patient is calling to speak to dr instead of using my chart.He has medicine questions his call back number [phone].

## 2024-08-07 NOTE — Telephone Encounter (Signed)
 I would start back on Amlodipine  5 mg once daily. Metoprolol  succinate 12.5 mg won't do much. Amlodipine  is more likely to get it back to normal.   Glendia Ferrier, PA-C    08/07/2024 5:54 PM

## 2024-08-07 NOTE — Telephone Encounter (Signed)
 Called the patient for more information as PCP is seeing patients.  Patient stated during the last visit, Dr Ozell mentioned she had an exercise course or program for balance problems and he did not see this attached in the Mychart?  Forwarded to PCP.

## 2024-08-07 NOTE — Telephone Encounter (Signed)
Left message for patient to call back. Also sent MyChart message.

## 2024-08-07 NOTE — Telephone Encounter (Signed)
 See prior phone note.

## 2024-08-07 NOTE — Telephone Encounter (Signed)
 Spoke to patient.   Patient states he had  taken Metoprolol  succinate 12.5 mg  due to blood  pressure  increasing in 05/25/23.  During this time he ws receiving steroid injection for his back.  11/28/23  -  was seen by Dr Wonda- at that time  amlodipine  5 mg  daily.patient  was instructed  on 12/14/24 by mychart  -- Valerie, It's not uncommon to have BP and HR issues after those injections. Steroids really wreak havoc on your system. Continue to check your blood pressure, no more than once daily, and remain off the Amlodipine  if staying below 130/80. If you see at least 3-5 readings of being 140/90, start back on the Amlodipine . I wouldn't be overly concerned about the HR not showing on your monitor since arrhythmias have not been a concern for you. Sarah, RN    Since then patient has not taken the metoprolol   12.5 mg or Amlodipine  5 mg per patient.   Patient stating blood pressure now has increase 137-149/92 he wants to know if he can restart Metoprolol .    RN informed patient to send a blood pressure reading for next week.  To add the provider.I making a decision.    Patient states he want able to do this in one week because he is going an [place]  trip on 08/14/24 for 10 days.   Patient would like some instruction prior to him leaving on a trip.   Patient is aware will send  a message to Dr Wonda for review.

## 2024-08-07 NOTE — Telephone Encounter (Signed)
 Pt c/o medication issue:  1. Name of Medication: Metoprolol  succinate  2. How are you currently taking this medication (dosage and times per day)? Take 0.5 tablets (12.5 mg total) by mouth daily.   3. Are you having a reaction (difficulty breathing--STAT)? No  4. What is your medication issue? Pt is stating he is on above medication. Is he supposed to still be taking it? Pt is confused about this medication.

## 2024-08-07 NOTE — Telephone Encounter (Signed)
 Copied from CRM [id number]. Topic: Clinical - Medication Question >> Aug 07, 2024 11:34 AM Gennette ORN wrote: Reason for CRM: Patient is calling to speak to dr instead of using my chart.He has medicine questions his call back number [phone]. >> Aug 07, 2024 11:46 AM Burnard DEL wrote: Patient called in stating that provider was suppose to send him an exercise program to help with his balance. Patient would like to know if provider will still be sending

## 2024-08-08 NOTE — Telephone Encounter (Signed)
 I have printer it and will leave it for him

## 2024-08-08 NOTE — Telephone Encounter (Signed)
 Patient is aware the exercises were left at the front desk for pick up.

## 2024-08-10 NOTE — Telephone Encounter (Signed)
 Pt got the Amlodipine  5 mg and has only taken it for a few days.     BP log-  156/96- took Amlodipine  in evening  =137/82 next day- took amlodipine  in evening Next day BP=116/73- - so he did not take it that evening because he was afraid that it would make his BP go down too low.  145/86 this morning- took medication after checking it and has not rechecked BP since taking med  Instructed that if systolic bp is 110 or over, then he can take the medicine.   Informed him that I would send this information to his provider and see if he had any further advise.

## 2024-08-12 MED ORDER — AMLODIPINE BESYLATE 5 MG PO TABS: 2.5000 mg | ORAL_TABLET | Freq: Every day | ORAL | Status: DC

## 2024-08-12 NOTE — Addendum Note (Signed)
 Addended byBETHA FERRIER, GLENDIA T on: 08/12/2024 06:28 PM   Modules accepted: Orders

## 2024-08-13 MED ORDER — AMLODIPINE BESYLATE 2.5 MG PO TABS: 2.5000 mg | ORAL_TABLET | Freq: Every day | ORAL | 3 refills | Status: AC

## 2024-08-13 NOTE — Addendum Note (Signed)
 Addended by: ANDREZ PRAIRIE on: 08/13/2024 09:29 AM   Modules accepted: Orders

## 2024-09-04 DIAGNOSIS — H40033 Anatomical narrow angle, bilateral: Secondary | ICD-10-CM | POA: Diagnosis not present

## 2024-09-04 DIAGNOSIS — H40032 Anatomical narrow angle, left eye: Secondary | ICD-10-CM | POA: Diagnosis not present

## 2024-09-04 DIAGNOSIS — H18413 Arcus senilis, bilateral: Secondary | ICD-10-CM | POA: Diagnosis not present

## 2024-09-04 DIAGNOSIS — H53012 Deprivation amblyopia, left eye: Secondary | ICD-10-CM | POA: Diagnosis not present

## 2024-09-04 DIAGNOSIS — H2513 Age-related nuclear cataract, bilateral: Secondary | ICD-10-CM | POA: Diagnosis not present

## 2024-09-04 DIAGNOSIS — H2512 Age-related nuclear cataract, left eye: Secondary | ICD-10-CM | POA: Diagnosis not present

## 2024-09-06 DIAGNOSIS — R350 Frequency of micturition: Secondary | ICD-10-CM | POA: Diagnosis not present

## 2024-09-06 DIAGNOSIS — R3915 Urgency of urination: Secondary | ICD-10-CM | POA: Diagnosis not present

## 2024-09-06 DIAGNOSIS — R351 Nocturia: Secondary | ICD-10-CM | POA: Diagnosis not present

## 2024-09-11 DIAGNOSIS — H40031 Anatomical narrow angle, right eye: Secondary | ICD-10-CM | POA: Diagnosis not present

## 2024-09-25 DIAGNOSIS — M5416 Radiculopathy, lumbar region: Secondary | ICD-10-CM | POA: Diagnosis not present

## 2024-10-01 DIAGNOSIS — H903 Sensorineural hearing loss, bilateral: Secondary | ICD-10-CM | POA: Diagnosis not present

## 2024-10-02 DIAGNOSIS — H90A21 Sensorineural hearing loss, unilateral, right ear, with restricted hearing on the contralateral side: Secondary | ICD-10-CM | POA: Diagnosis not present

## 2024-10-02 DIAGNOSIS — R42 Dizziness and giddiness: Secondary | ICD-10-CM | POA: Diagnosis not present

## 2024-10-02 DIAGNOSIS — H9313 Tinnitus, bilateral: Secondary | ICD-10-CM | POA: Diagnosis not present

## 2024-10-02 DIAGNOSIS — Z974 Presence of external hearing-aid: Secondary | ICD-10-CM | POA: Diagnosis not present

## 2024-10-03 ENCOUNTER — Telehealth: Payer: Self-pay | Admitting: *Deleted

## 2024-10-03 ENCOUNTER — Encounter: Payer: Self-pay | Admitting: Family Medicine

## 2024-10-03 NOTE — Telephone Encounter (Signed)
Please see Mychart message from pt.

## 2024-10-03 NOTE — Telephone Encounter (Signed)
 Copied from CRM #[id number]. Topic: General - Other >> Oct 03, 2024 10:54 AM Larissa RAMAN wrote: Reason for CRM: Patient requesting a callback from PCP/nurse to discuss possible MRI for head/ear.  Callback # [phone]

## 2024-10-04 DIAGNOSIS — L814 Other melanin hyperpigmentation: Secondary | ICD-10-CM | POA: Diagnosis not present

## 2024-10-04 DIAGNOSIS — D1801 Hemangioma of skin and subcutaneous tissue: Secondary | ICD-10-CM | POA: Diagnosis not present

## 2024-10-04 DIAGNOSIS — L578 Other skin changes due to chronic exposure to nonionizing radiation: Secondary | ICD-10-CM | POA: Diagnosis not present

## 2024-10-04 DIAGNOSIS — L821 Other seborrheic keratosis: Secondary | ICD-10-CM | POA: Diagnosis not present

## 2024-10-04 DIAGNOSIS — L570 Actinic keratosis: Secondary | ICD-10-CM | POA: Diagnosis not present

## 2024-10-04 NOTE — Telephone Encounter (Signed)
 I reviewed the note from the ENT and I agree that MRI should be done. Please let patient know that he should have the MRI done to rule out the neuroma due to the hearing loss.

## 2024-10-04 NOTE — Telephone Encounter (Signed)
 See note PCP sent via Mychart message which was reviewed by the patient with information as below.

## 2024-10-16 ENCOUNTER — Other Ambulatory Visit: Payer: Self-pay | Admitting: Physical Medicine and Rehabilitation

## 2024-10-16 DIAGNOSIS — H90A21 Sensorineural hearing loss, unilateral, right ear, with restricted hearing on the contralateral side: Secondary | ICD-10-CM

## 2024-10-17 DIAGNOSIS — H1031 Unspecified acute conjunctivitis, right eye: Secondary | ICD-10-CM | POA: Diagnosis not present

## 2024-10-17 DIAGNOSIS — H53143 Visual discomfort, bilateral: Secondary | ICD-10-CM | POA: Diagnosis not present

## 2024-10-18 NOTE — Telephone Encounter (Signed)
 Called patient to verify that he has MRI IAC date for November 19, 2024 with [facility] patient has anxiety with claustrophobia patient was asking about getting antianxiety medicine one-time order for this procedure.  Patient was asking for double strength medication due to his anxiety I discussed with patient that I would not be able to provide double strength as for safety reasons need to have geriatric dosing and on one-time order only for Xanax or similar medication.  Also discussed with patient that if medication is prescribed he will need to have designated driver for his procedure patient and knowledge that his wife will be designated driver for the full day after receiving the medication.  Will place medication within 1 week of his scheduled procedure.

## 2024-10-22 ENCOUNTER — Other Ambulatory Visit: Payer: Self-pay | Admitting: Gastroenterology

## 2024-10-23 ENCOUNTER — Encounter: Payer: Self-pay | Admitting: Family Medicine

## 2024-10-23 ENCOUNTER — Ambulatory Visit (INDEPENDENT_AMBULATORY_CARE_PROVIDER_SITE_OTHER): Admitting: Family Medicine

## 2024-10-23 VITALS — BP 128/80 | HR 73 | Temp 97.8°F | Ht 70.0 in | Wt 185.2 lb

## 2024-10-23 DIAGNOSIS — J020 Streptococcal pharyngitis: Secondary | ICD-10-CM

## 2024-10-23 DIAGNOSIS — R599 Enlarged lymph nodes, unspecified: Secondary | ICD-10-CM | POA: Diagnosis not present

## 2024-10-23 LAB — POC COVID19 BINAXNOW: SARS Coronavirus 2 Ag: NEGATIVE

## 2024-10-23 LAB — POCT RAPID STREP A (OFFICE): Rapid Strep A Screen: POSITIVE — AB

## 2024-10-23 MED ORDER — CEFDINIR 300 MG PO CAPS: 300.0000 mg | ORAL_CAPSULE | Freq: Two times a day (BID) | ORAL | 0 refills | Status: AC

## 2024-10-23 NOTE — Telephone Encounter (Signed)
 Noted- ok to close.

## 2024-10-23 NOTE — Progress Notes (Signed)
 Established Patient Office Visit  Subjective   Patient ID: Ronnie Vazquez, male    DOB: 08/25/1946  Age: 78 y.o. MRN: [id number]  Chief Complaint  Patient presents with   painful gland noted along right side of neck x4 days    HPI Discussed the use of AI scribe software for clinical note transcription with the patient, who gave verbal consent to proceed.  History of Present Illness   Ronnie Vazquez is a 78 year old male who presents with a sore throat and swollen lymph nodes.  He began experiencing a cough on October 16th, three days after exposure to his coughing grandson. Over-the-counter medications, including a DM cough suppressant and Mucinex, have not alleviated his symptoms. Swelling and soreness in the lymph nodes, particularly on the right side, appeared approximately three to four days ago, described as a 'lump' that is sore when swallowing. This soreness has slightly improved in the last twelve hours. He denies fever, chills, ear pain, or difficulty breathing. Coughing sometimes feels like a pulled muscle in his chest, but there is no wheezing or shortness of breath.       Current Outpatient Medications  Medication Instructions   amLODipine  (NORVASC ) 2.5 mg, Oral, Daily   aspirin  EC 81 mg   cefdinir (OMNICEF) 300 mg, Oral, 2 times daily   Cholecalciferol (VITAMIN D3) 1.25 MG (50000 UT) CAPS Take by mouth.   ezetimibe  (ZETIA ) 10 mg, Oral, Daily   ketoconazole  (NIZORAL ) 2 % cream 1 Application, Topical, 2 times daily   loteprednol (LOTEMAX) 0.5 % ophthalmic suspension 1 drop, 4 times daily   nitroGLYCERIN  (NITROSTAT ) 0.4 mg, Sublingual, Every 5 min PRN   pantoprazole  (PROTONIX ) 40 mg, Oral, Daily    Patient Active Problem List   Diagnosis Date Noted   BPH without obstruction/lower urinary tract symptoms 07/04/2024   Essential hypertension 05/31/2024   Osteoarthritis of toe joint, right 06/06/2023   Hemorrhoids 06/04/2021   Gastritis without bleeding 06/04/2021    Esophageal dysphagia    Typical atrial flutter (HCC) 09/06/2020   B12 deficiency 11/14/2019   Gastroesophageal reflux disease 11/28/2018   Coronary artery disease with exertional angina 08/15/2018   Carotid artery disease 04/05/2016   TIA (transient ischemic attack) 12/01/2015   Mixed hyperlipidemia 02/02/2013   Microscopic hematuria, evaluated by urology 02/2013 02/02/2013     Review of Systems  All other systems reviewed and are negative.     Objective:     BP 128/80   Pulse 73   Temp 97.8 F (36.6 C) (Oral)   Ht 5' 10 (1.778 m)   Wt 185 lb 3.2 oz (84 kg)   SpO2 98%   BMI 26.57 kg/m    Physical Exam Vitals reviewed.  Constitutional:      Appearance: Normal appearance. He is normal weight.  HENT:     Right Ear: Tympanic membrane and ear canal normal.     Left Ear: Tympanic membrane and ear canal normal.     Nose: No congestion or rhinorrhea.     Mouth/Throat:     Mouth: Mucous membranes are moist.     Pharynx: Posterior oropharyngeal erythema present.  Eyes:     Conjunctiva/sclera: Conjunctivae normal.  Cardiovascular:     Rate and Rhythm: Normal rate and regular rhythm.     Heart sounds: Normal heart sounds.  Pulmonary:     Effort: Pulmonary effort is normal.     Breath sounds: Normal breath sounds.     Comments: Inspiratory crackles in  the BL lower lobes Lymphadenopathy:     Cervical: Cervical adenopathy present.  Neurological:     Mental Status: He is alert.      Results for orders placed or performed in visit on 10/23/24  POC Rapid Strep A  Result Value Ref Range   Rapid Strep A Screen Positive (A) Negative  POC COVID-19  Result Value Ref Range   SARS Coronavirus 2 Ag Negative Negative      The 10-year ASCVD risk score (Arnett DK, et al., 2019) is: 30.1%    Assessment & Plan:  Strep pharyngitis -     Cefdinir; Take 1 capsule (300 mg total) by mouth 2 (two) times daily for 10 days.  Dispense: 20 capsule; Refill: 0  Swollen gland -      POCT rapid strep A -     POC COVID-19 BinaxNow    Assessment and Plan    Streptococcal pharyngitis with right cervical lymphadenopathy Acute streptococcal pharyngitis confirmed by positive strep test. Symptoms began on October 11, 2024, with cough and right cervical lymphadenopathy. No fever, chills, or dyspnea. Lymphadenopathy attributed to immune response to infection. No known antibiotic allergies. No interference expected with upcoming eye surgery antibiotics. - Prescribe cefdinir 300 mg, one capsule twice daily for 10 days. - Advise good hand hygiene to prevent spread. - Instruct to start cefdinir today with two doses, one in the afternoon and one at night. - Confirm COVID-19 test is negative.  Right ankle pain Intermittent right ankle pain and swelling following an injury five months ago. Recent back surgery six months ago. Possible causes include residual effects from injury, numbness, or arthritis. Previous consultation with Doctor Theophilus for right leg pain. - Monitor symptoms and consider podiatry referral if pain and swelling persist. - Consider x-rays if symptoms do not improve.       No follow-ups on file.    Heron CHRISTELLA Sharper, MD

## 2024-10-30 ENCOUNTER — Ambulatory Visit: Admitting: Family Medicine

## 2024-10-30 ENCOUNTER — Ambulatory Visit: Payer: Self-pay

## 2024-10-30 VITALS — BP 110/70 | HR 44 | Temp 97.6°F | Ht 70.0 in | Wt 183.7 lb

## 2024-10-30 DIAGNOSIS — I499 Cardiac arrhythmia, unspecified: Secondary | ICD-10-CM | POA: Diagnosis not present

## 2024-10-30 DIAGNOSIS — I491 Atrial premature depolarization: Secondary | ICD-10-CM | POA: Diagnosis not present

## 2024-10-30 NOTE — Telephone Encounter (Signed)
 Noted- ok to close.

## 2024-10-30 NOTE — Telephone Encounter (Signed)
 FYI Only or Action Required?: FYI only for provider: appointment scheduled on 11/4.  Patient was last seen in primary care on 10/23/2024 by Ozell Heron HERO, MD.  Called Nurse Triage reporting Tinnitus.  Symptoms began several days ago.  Interventions attempted: Nothing.  Symptoms are: gradually worsening.  Triage Disposition: See PCP When Office is Open (Within 3 Days)  Patient/caregiver understands and will follow disposition?: Yes            Copied from CRM #[id number]. Topic: Clinical - Red Word Triage >> Oct 30, 2024  2:05 PM Robinson H wrote: [facility] that prompted transfer to Nurse Triage: Ringing in ear is getting louder and on 7th day on antibiotic Reason for Disposition  MODERATE-SEVERE tinnitus (i.e., interferes with work, school, or sleep)  Answer Assessment - Initial Assessment Questions 1. DESCRIPTION: Describe the sound you are hearing. (e.g., buzzing, hissing, humming, ringing)     BIL ears   2. LOCATION: Is the sound in one or both ears? If one, ask: Which ear?     Unsure   3. SEVERITY: How bad is it?      5/10  4. ONSET: When did this begin? Did it start suddenly or come on gradually?     Several days  5. PATTERN: Does this come and go, or has it been constant since it started?     Constant   6. HEARING LOSS: Is your hearing decreased? (e.g., normal, decreased)       No   7. OTHER SYMPTOMS: Do you have any other symptoms? (e.g., dizziness, earache)  Dizziness- mild, blurred vision    Patient seen in office on 10/28 for strep throat patient has completed the medication but states the ringing has gotten worse in his ears.  Protocols used: Tinnitus-A-AH

## 2024-10-30 NOTE — Telephone Encounter (Signed)
 Appt today at 4pm

## 2024-10-30 NOTE — Progress Notes (Signed)
 Acute Office Visit  Subjective:     Patient ID: Ronnie Vazquez, male    DOB: 01/20/1946, 78 y.o.   MRN: [id number]  Chief Complaint  Patient presents with   Tinnitus    Worsening x2-3 days   Dizziness    Worsening x1 day    Dizziness   Patient is in today for increasing tinnitus for the past few days, also dizziness. He reports that he has been taking the cefdinir twice a day as prescribed and his throat symptoms/ swollen neck glands have improved/ resolved, is on day 7 of the antibiotics, however he noticed worsening ringing in the ears and was wondering if the abx was causing this. I reviewed the side effect profile for cefdinir and did not see tinnitus listed, however it may be possible. Also during obtaining vitals the MA noted an irregular rhythm. Pt reports no syncope, no heart palpitations, no SOB. Pt reports a history of atrial fibrillation in the past. Had EKG in June 2025 which showed normal sinus rhythm. He denies any chest pain, no nausea or vomiting, no other symptoms except for the dizziness.   Review of Systems  Neurological:  Positive for dizziness.  All other systems reviewed and are negative.  EKG interpretation note:  Previous EKG's are available for comparison. Used EKG from June 2025 for comparison -- today he is in normal sinus rhythm with PAC. HR 73, no ST or T wave abnormalities.       Objective:    BP 110/70   Pulse (!) 44   Temp 97.6 F (36.4 C) (Oral)   Ht 5' 10 (1.778 m)   Wt 183 lb 11.2 oz (83.3 kg)   SpO2 96%   BMI 26.36 kg/m    Physical Exam Vitals reviewed.  Constitutional:      Appearance: Normal appearance. He is normal weight.  Cardiovascular:     Rate and Rhythm: Normal rate. Rhythm irregular.     Pulses: Normal pulses.     Heart sounds: Normal heart sounds. No murmur heard. Pulmonary:     Effort: Pulmonary effort is normal.     Breath sounds: Normal breath sounds. No wheezing.  Neurological:     Mental Status: He is alert  and oriented to person, place, and time. Mental status is at baseline.  Psychiatric:        Mood and Affect: Mood normal.        Behavior: Behavior normal.     No results found for any visits on 10/30/24.      Assessment & Plan:   Problem List Items Addressed This Visit   None Visit Diagnoses       Irregular heart rhythm    -  Primary   Relevant Orders   EKG 12-Lead (Completed)     PAC (premature atrial contraction)         Have patient stop the antibiotic in case this is the problem, has already had 7 days  Of cefdinir and there is no LAD on exam today. Ok to stop the abx. He is also having eye surgery on the 21st and the MRI of the brain on the 24th. The irregular rhythm is accounted for with the frequent PAC's. I do not think this is causing his dizziness. He already had holter monitor last year and this was reviewed with patient.  20 minutes spent with patient today performing physical exam and discussing his symptoms. No orders of the defined types were placed in this  encounter.   No follow-ups on file.  Heron CHRISTELLA Sharper, MD

## 2024-11-01 ENCOUNTER — Ambulatory Visit: Attending: Physician Assistant | Admitting: Emergency Medicine

## 2024-11-01 ENCOUNTER — Ambulatory Visit

## 2024-11-01 ENCOUNTER — Telehealth: Payer: Self-pay | Admitting: Cardiovascular Disease

## 2024-11-01 ENCOUNTER — Telehealth (HOSPITAL_BASED_OUTPATIENT_CLINIC_OR_DEPARTMENT_OTHER): Payer: Self-pay | Admitting: *Deleted

## 2024-11-01 ENCOUNTER — Encounter: Payer: Self-pay | Admitting: Physician Assistant

## 2024-11-01 VITALS — BP 124/80 | HR 72 | Ht 70.0 in | Wt 180.6 lb

## 2024-11-01 DIAGNOSIS — I2510 Atherosclerotic heart disease of native coronary artery without angina pectoris: Secondary | ICD-10-CM | POA: Insufficient documentation

## 2024-11-01 DIAGNOSIS — R001 Bradycardia, unspecified: Secondary | ICD-10-CM | POA: Insufficient documentation

## 2024-11-01 DIAGNOSIS — Z01810 Encounter for preprocedural cardiovascular examination: Secondary | ICD-10-CM | POA: Insufficient documentation

## 2024-11-01 DIAGNOSIS — Z8679 Personal history of other diseases of the circulatory system: Secondary | ICD-10-CM | POA: Diagnosis not present

## 2024-11-01 DIAGNOSIS — E785 Hyperlipidemia, unspecified: Secondary | ICD-10-CM | POA: Insufficient documentation

## 2024-11-01 NOTE — Telephone Encounter (Signed)
   Pre-operative Risk Assessment    Patient Name: Ronnie Vazquez  DOB: 01-Nov-1946 MRN: [id number]   Date of last office visit: 06/01/24 GLENDIA FERRIER, [facility]  Date of next office visit: 11/07/24 SCOTT WEAVER, PAC  PREOP APP SEE PHONE NOTE FROM TODAY   Request for Surgical Clearance    Procedure:  CATARACT EXTRACTION w/INTRAOCULAR LENS IMPLANT LEFT EYE TO BE DONE 11/02/24 ; FOLLOWED BY THE RIGHT EYE  Date of Surgery:  Clearance 11/02/24                                Surgeon:  DR. EVALENE RAW Surgeon's Group or Practice Name:  [facility] Phone number:  [phone] Fax number:  [phone]   Type of Clearance Requested:   - Medical ; PER FORM NO MEDS NEED TO BE HELD   Type of Anesthesia:  TOPICAL WITH IV MEDICATION   Additional requests/questions:    Bonney Niels Jest   11/01/2024, 1:15 PM

## 2024-11-01 NOTE — Progress Notes (Addendum)
 Cardiology Office Note:    Date:  11/01/2024   ID:  Ronnie Vazquez, DOB 1946-11-20, MRN [id number]  PCP:  Ozell Heron HERO, MD   [facility] Providers Cardiologist:  Ozell Fell, MD     Referring MD: Ozell Heron HERO, MD   Chief complaint: Bradycardia/preoperative clearance  History of Present Illness:    Ronnie Vazquez is a 78 y.o. male with a hx of CAD s/p complex bifurcational PCI with 3 x 30 mm DES to pLAD and POBA to the D1 through stent struts on 08/2018. Clopidogrel  was changed to ticagrelor  due to tinnitus without resolution.  He is now on aspirin  monotherapy.  He had a follow-up nuclear stress test 07/2019 that showed no ischemia or scar, read as low risk.  Last echocardiogram 05/2023 showed LVEF 55 to 60%, with moderate LAE, mild RAE, AV sclerosis, aortic root 38 mm, ascending aorta 39 mm.  He has a history of atrial flutter and is s/p CTI ablation 09/2020 with Dr. Fernande.  Follow-up heart monitor June 2024 showed no recurrence, he is not currently anticoagulated.  He has been intolerant to statins, PCSK9 inhibitor, bempedoic acid .  He is on ezetimibe .  He has a history of questionable TIA in 2014.  He was last seen by Glendia Ferrier, PA 06/01/2024.  He did not continue his amlodipine  for hypertension.  Patient called our office reporting seeing his PCP two days ago for follow up of an antibiotic prescribed for strep throat.  He stated his heart rate was 43 bpm on pulse oximeter. EKG showed sinus rhythm with ectopy and heart rate of 72. Patient went home, checked his finger on his home pulse oximeter and noticed his pulse was in the 40s again. He has pending cataract surgery but wanted to be seen by cardiology prior to this procedure.  He was placed on my schedule today.  Presents independently, doing well from a cardiac standpoint. He denies chest pain, palpitations, dyspnea, orthopnea, n, v,  dark/tarry/bloody stools, hematuria, syncope, edema, weight gain. Has recently  had some vision and hearing/otic issues he states have occasionally made him dizzy, with recent treatment changes with his eye doctor the dizziness has improved, and he's hoping his upcoming cataract surgery will continue to improve occasional dizziness. He tries to remain active, walks around a mile daily, able to perform activities of daily living independently, uses a push mower for his lawn to help exercise. Has previously had back surgery, so he tries to avoid heavy lifting, to prevent back pain from flaring. Denies nitroglycerin  use.    Past Medical History:  Diagnosis Date   Arthritis    bilateral feet   Atrial flutter (HCC)    Echo 9/21: EF 60-65, no RWMA, normal RVSF, trivial MR, borderline dilation of aorta at sinus of Valsalva (37 mm), mild LAE, RVSP 26.8   Chicken pox    hx   Coronary artery disease    08/29/18 PCI/DES with bifurcation from LAD into 1st diag.    GERD (gastroesophageal reflux disease)    with certain foods/on OTC meds   Hyperlipidemia    on meds   Left shoulder pain 02/02/2013   Microscopic hematuria 02/02/2013   Vision loss of left eye     Past Surgical History:  Procedure Laterality Date   A-FLUTTER ABLATION N/A 10/24/2020   Procedure: A-FLUTTER ABLATION;  Surgeon: Fernande Elspeth BROCKS, MD;  Location[facility] INVASIVE CV LAB;  Service: Cardiovascular;  Laterality: N/A;   COLONOSCOPY  10/2017  MS-suprep(good)-SSA/TICS/hems/TA's-3 yr recall   CORONARY BALLOON ANGIOPLASTY N/A 08/29/2018   Procedure: CORONARY BALLOON ANGIOPLASTY;  Surgeon: Wonda Sharper, MD;  Location: [facility] INVASIVE CV LAB;  Service: Cardiovascular;  Laterality: N/A;   CORONARY STENT INTERVENTION N/A 08/29/2018   Procedure: CORONARY STENT INTERVENTION;  Surgeon: Wonda Sharper, MD;  Location: [facility]VAS[facility] CV LAB;  Service: Cardiovascular;  Laterality: N/A;   ESOPHAGEAL MANOMETRY N/A 10/08/2020   Procedure: ESOPHAGEAL MANOMETRY (EM);  Surgeon: Aneita Gwendlyn DASEN, MD;  Location: [facility] ENDOSCOPY;  Service: Endoscopy;   Laterality: N/A;   LEFT HEART CATH AND CORONARY ANGIOGRAPHY N/A 08/08/2018   Procedure: LEFT HEART CATH AND CORONARY ANGIOGRAPHY;  Surgeon: Court Dorn PARAS, MD;  Location: [facility] INVASIVE CV LAB;  Service: Cardiovascular;  Laterality: N/A;   POLYPECTOMY  10/2017   SSA/TA's   TONSILLECTOMY AND ADENOIDECTOMY  1959   WISDOM TOOTH EXTRACTION  1975    Current Medications: Current Meds  Medication Sig   aspirin  EC 81 MG tablet Take 81 mg by mouth. Only takes occasionally Swallow whole.   cefdinir (OMNICEF) 300 MG capsule Take 1 capsule (300 mg total) by mouth 2 (two) times daily for 10 days.   Cholecalciferol (VITAMIN D3) 1.25 MG (50000 UT) CAPS Take by mouth.   ezetimibe  (ZETIA ) 10 MG tablet Take 1 tablet (10 mg total) by mouth daily.   ketoconazole  (NIZORAL ) 2 % cream Apply 1 Application topically 2 (two) times daily.   loteprednol (LOTEMAX) 0.5 % ophthalmic suspension 1 drop 4 (four) times daily.   nitroGLYCERIN  (NITROSTAT ) 0.4 MG SL tablet Place 1 tablet (0.4 mg total) under the tongue every 5 (five) minutes as needed for chest pain.   pantoprazole  (PROTONIX ) 40 MG tablet TAKE 1 TABLET BY MOUTH DAILY     Allergies:   Repatha  [evolocumab ], Bempedoic acid , Crestor  [rosuvastatin  calcium ], and Statins   Social History   Socioeconomic History   Marital status: Married    Spouse name: Not on file   Number of children: 2   Years of education: Not on file   Highest education level: Bachelor's degree (e.g., BA, AB, BS)  Occupational History   Occupation: art gallery manager - retired  Tobacco Use   Smoking status: Never   Smokeless tobacco: Never  Vaping Use   Vaping status: Never Used  Substance and Sexual Activity   Alcohol use: Yes    Alcohol/week: 1.0 standard drink of alcohol    Types: 1 Standard drinks or equivalent per week    Comment: one glass or so a week   Drug use: No   Sexual activity: Not on file  Other Topics Concern   Not on file  Social History Narrative   Work or School:  biomedical engineer, 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      Home Situation: lives with wife      Spiritual Beliefs: none      Lifestyle: exercises 3 times per week               Social Drivers of Health   Financial Resource Strain: Low Risk  (05/14/2024)   Overall Financial Resource Strain (CARDIA)    Difficulty of Paying Living Expenses: Not hard at all  Food Insecurity: No Food Insecurity (05/14/2024)   Hunger Vital Sign    Worried About Running Out of Food in the Last Year: Never true    Ran Out of Food in the Last Year: Never true  Transportation Needs: No Transportation Needs (05/14/2024)   PRAPARE - Transportation    Lack  of Transportation (Medical): No    Lack of Transportation (Non-Medical): No  Physical Activity: Sufficiently Active (05/14/2024)   Exercise Vital Sign    Days of Exercise per Week: 7 days    Minutes of Exercise per Session: 30 min  Stress: No Stress Concern Present (05/14/2024)   [employer] of Occupational Health - Occupational Stress Questionnaire    Feeling of Stress : Not at all  Social Connections: Moderately Integrated (05/14/2024)   Social Connection and Isolation Panel    Frequency of Communication with Friends and Family: More than three times a week    Frequency of Social Gatherings with Friends and Family: More than three times a week    Attends Religious Services: Never    Database Administrator or Organizations: Yes    Attends Banker Meetings: Never    Marital Status: Married     Family History: The patient's family history includes Arthritis in his mother; Arthritis/Rheumatoid in his brother; Prostate cancer in his father. There is no history of Colon cancer, Esophageal cancer, Pancreatic cancer, Rectal cancer, Stomach cancer, Heart disease, or Colon polyps.  ROS:   Please see the history of present illness.     All other systems reviewed and are negative.  EKGs/Labs/Other Studies Reviewed:    The following studies were  reviewed today:  EKG Interpretation Date/Time:  Thursday November 01 2024 14:44:33 EST Ventricular Rate:  72 PR Interval:  178 QRS Duration:  98 QT Interval:  398 QTC Calculation: 435 R Axis:   -9  Text Interpretation: Normal sinus rhythm Normal ECG When compared with ECG of 01-Jun-2024 10:04, No significant change was found Confirmed by Jennessy Sandridge [phone]) on 11/01/2024 2:50:35 PM    Recent Labs: No results found for requested labs within last 365 days.  Recent Lipid Panel    Component Value Date/Time   CHOL 193 07/16/2024 0839   CHOL 196 05/25/2023 0950   TRIG 169.0 (H) 07/16/2024 0839   HDL 39.60 07/16/2024 0839   HDL 48 05/25/2023 0950   CHOLHDL 5 07/16/2024 0839   VLDL 33.8 07/16/2024 0839   LDLCALC 120 (H) 07/16/2024 0839   LDLCALC 122 (H) 05/25/2023 0950   LDLCALC 106 (H) 10/13/2020 0854   LDLDIRECT 117.0 08/12/2017 0921     Risk Assessment/Calculations:    CHA2DS2-VASc Score = 3   This indicates a 3.2% annual risk of stroke. The patient's score is based upon: CHF History: 0 HTN History: 0 Diabetes History: 0 Stroke History: 0 Vascular Disease History: 1 Age Score: 2 Gender Score: 0               Physical Exam:    VS:  BP 124/80   Pulse 72   Ht 5' 10 (1.778 m)   Wt 180 lb 9.6 oz (81.9 kg)   SpO2 96%   BMI 25.91 kg/m     Wt Readings from Last 3 Encounters:  11/01/24 180 lb 9.6 oz (81.9 kg)  10/30/24 183 lb 11.2 oz (83.3 kg)  10/23/24 185 lb 3.2 oz (84 kg)     GEN:  Well nourished, well developed in no acute distress HEENT: Normal NECK:  No carotid bruits CARDIAC: RRR, no murmurs, rubs, gallops RESPIRATORY:  Clear to auscultation without rales, wheezing or rhonchi  MUSCULOSKELETAL:  No edema; No deformity, palpable DP pulses bilaterally SKIN: Warm and dry NEUROLOGIC:  Alert and oriented x 3 PSYCHIATRIC:  Normal affect   ASSESSMENT:    1. Bradycardia   2. History  of atrial flutter   3. Coronary artery disease involving native  coronary artery of native heart without angina pectoris   4. Hyperlipidemia, unspecified hyperlipidemia type   5. Preoperative cardiovascular examination    PLAN:    In order of problems listed above:  Bradycardia on pulse oximeter EKG two days ago at PCP office: NSR w/ PAC, 72 bpm EKG today shows: NSR, 72 bpm No ectopy, arrhythmia observed Will order two week zio monitoring to evaluate for arrhythmia given prior hx of atrial flutter w/ ablation  History of atrial flutter s/p CTI ablation 2021 Follow-up heart monitor 05/2023 showed no recurrence of A-fib/flutter He is currently not anticoagulated He has noted PACs  CAD s/p DES-LAD, POBA-D1 CAD s/p complex bifurcational PCI with 3 x 30 mm DES to pLAD and POBA to the D1 through stent struts on 08/2018 Denies CP, SOB, orthopnea, near syncope Continue aspirin  81 mg Continue Zetia  10 mg Continue nitroglycerin  0.4 mg SL tab PRN chest pain every 5 minutes  Hyperlipidemia with LDL goal less than 55 Denies history of prior stroke, TIA Would prefer a lower LDL goal given proximal LAD stent with 30 mm of DES Unfortunately he is intolerant to statins and PCSK9 inhibitors He is doing well on Zetia  No other pharmacological options available  Preoperative cardiovascular clearance According to the Revised Cardiac Risk Index (RCRI), his Perioperative Risk of Major Cardiac Event is (%): 0.9 His Functional Capacity in METs is: 5.62 according to the Duke Activity Status Index (DASI). Therefore, based on ACC/AHA guidelines, patient would be at acceptable risk for the cataract surgery without further cardiovascular testing. I will route this recommendation to the requesting party via Epic fax function.  Regarding ASA therapy, we recommend continuation of ASA throughout the perioperative period given this procedure is minimally invasive and he has a history of CAD with complex PCI/DES placement.       Follow up in one month with APP to discuss zio  results      Medication Adjustments/Labs and Tests Ordered: Current medicines are reviewed at length with the patient today.  Concerns regarding medicines are outlined above.  Orders Placed This Encounter  Procedures   LONG TERM MONITOR (3-14 DAYS)   EKG 12-Lead   No orders of the defined types were placed in this encounter.   Patient Instructions  Medication Instructions:  Your physician recommends that you continue on your current medications as directed. Please refer to the Current Medication list given to you today.  *If you need a refill on your cardiac medications before your next appointment, please call your pharmacy*  Lab Work: None  If you have labs (blood work) drawn today and your tests are completely normal, you will receive your results only by: MyChart Message (if you have MyChart) OR A paper copy in the mail If you have any lab test that is abnormal or we need to change your treatment, we will call you to review the results.  Testing/Procedures: GEOFFRY HEWS- Long Term Monitor Instructions  Your physician has requested you wear a ZIO patch monitor for 14 days.  This is a single patch monitor. [employer] supplies one patch monitor per enrollment. Additional stickers are not available. Please do not apply patch if you will be having a Nuclear Stress Test,  Echocardiogram, Cardiac CT, MRI, or Chest Xray during the period you would be wearing the  monitor. The patch cannot be worn during these tests. You cannot remove and re-apply the  ZIO XT patch monitor.  Billing and Patient Assistance Program Information  We have supplied [employer] with any of your insurance information on file for billing purposes. [employer] offers a sliding scale Patient Assistance Program for patients that do not have  insurance, or whose insurance does not completely cover the cost of the ZIO monitor.  You must apply for the Patient Assistance Program to qualify for this discounted rate.  To apply,  please call [employer] at [phone], select option 4, select option 2, ask to apply for  Patient Assistance Program. Meredeth will ask your household income, and how many people  are in your household. They will quote your out-of-pocket cost based on that information.  [employer] will also be able to set up a 63-month, interest-free payment plan if needed.  When you are ready to remove the patch, follow instructions on the last 2 pages of Patient  Logbook. Stick patch monitor onto the last page of Patient Logbook.  Place Patient Logbook in the blue and white box. Use locking tab on box and tape box closed  securely. The blue and white box has prepaid postage on it. Please place it in the mailbox as  soon as possible. Your physician should have your test results approximately 7 days after the  monitor has been mailed back to [facility].  Call [facility] Customer Care at [phone] if you have questions regarding  your ZIO XT patch monitor. Call them immediately if you see an orange light blinking on your  monitor.  If your monitor falls off in less than 4 days, contact our Monitor department at [phone].  If your monitor becomes loose or falls off after 4 days call [employer] at [phone] for  suggestions on securing your monitor   Follow-Up: At [facility], you and your health needs are our priority.  As part of our continuing mission to provide you with exceptional heart care, our providers are all part of one team.  This team includes your primary Cardiologist (physician) and Advanced Practice Providers or APPs (Physician Assistants and Nurse Practitioners) who all work together to provide you with the care you need, when you need it.  Your next appointment:   1 month(s)  Provider:   Glendia Ferrier PA    We recommend signing up for the patient portal called MyChart.  Sign up information is provided on this After Visit Summary.  MyChart is used to connect with  patients for Virtual Visits (Telemedicine).  Patients are able to view lab/test results, encounter notes, upcoming appointments, etc.  Non-urgent messages can be sent to your provider as well.   To learn more about what you can do with MyChart, go to [email or web address].   Other Instructions DO NOT STOP YOUR ASPIRIN  PRIOR TO YOUR PROCEDURE            Signed, Terrica Duecker E Khaza Blansett, NP  11/01/2024 4:31 PM    [facility]

## 2024-11-01 NOTE — Progress Notes (Unsigned)
 Applied a 14 day Zio XT monitor to patient in the office  Excell Seltzer to read

## 2024-11-01 NOTE — Telephone Encounter (Signed)
 Returned patient's call. Patient reports he was at his PCP office yesterday to discuss a poor reaction to an antibiotic he was on for strep throat. He noticed his HR was showing up on the pulse oximeter as 43. EKG was done and HR was SR with occasional PVC, HR of 72. Patient denies any symptoms of fatigue, syncope, palpitations, lightheadedness, chest pain or SOB. He states his concern is that he is scheduled for cataract surgery tomorrow and he is worried that either his aflutter is back and just didn't show up on the EKG or that his HR really is too low at times for any sedation or anesthesia. It appears there is no pre-op clearance in chart, patient states he is unaware of any pre-op clearance that would have been requested for this procedure. He is asking if he should be holding is aspirin .   Made patient an appt with Glendia Ferrier Pa-C for 11/07/24 to check HR and possibly clear patient for cataract procedure, patient states he will reschedule his eye procedure til he can be seen by Glendia.

## 2024-11-01 NOTE — Telephone Encounter (Signed)
   Patient Name: Ronnie Vazquez  DOB: May 21, 1946 MRN: [id number]  Primary Cardiologist: Ozell Fell, MD  Chart reviewed as part of pre-operative protocol coverage. Patient contacted our office regarding bradycardia at PCP visit. He scheduled a visit with APP for 11/07/24 and stated he would reschedule his cataract surgery until after his in office visit.   Will update requesting office and remove from pre-op pool.    Delon JAYSON Hoover, NP 11/01/2024, 1:25 PM

## 2024-11-01 NOTE — Patient Instructions (Addendum)
 Medication Instructions:  Your physician recommends that you continue on your current medications as directed. Please refer to the Current Medication list given to you today.  *If you need a refill on your cardiac medications before your next appointment, please call your pharmacy*  Lab Work: None  If you have labs (blood work) drawn today and your tests are completely normal, you will receive your results only by: MyChart Message (if you have MyChart) OR A paper copy in the mail If you have any lab test that is abnormal or we need to change your treatment, we will call you to review the results.  Testing/Procedures: GEOFFRY HEWS- Long Term Monitor Instructions  Your physician has requested you wear a ZIO patch monitor for 14 days.  This is a single patch monitor. [employer] supplies one patch monitor per enrollment. Additional stickers are not available. Please do not apply patch if you will be having a Nuclear Stress Test,  Echocardiogram, Cardiac CT, MRI, or Chest Xray during the period you would be wearing the  monitor. The patch cannot be worn during these tests. You cannot remove and re-apply the  ZIO XT patch monitor.   Billing and Patient Assistance Program Information  We have supplied [employer] with any of your insurance information on file for billing purposes. [employer] offers a sliding scale Patient Assistance Program for patients that do not have  insurance, or whose insurance does not completely cover the cost of the ZIO monitor.  You must apply for the Patient Assistance Program to qualify for this discounted rate.  To apply, please call [employer] at [phone], select option 4, select option 2, ask to apply for  Patient Assistance Program. Meredeth will ask your household income, and how many people  are in your household. They will quote your out-of-pocket cost based on that information.  [employer] will also be able to set up a 66-month, interest-free payment plan if needed.  When  you are ready to remove the patch, follow instructions on the last 2 pages of Patient  Logbook. Stick patch monitor onto the last page of Patient Logbook.  Place Patient Logbook in the blue and white box. Use locking tab on box and tape box closed  securely. The blue and white box has prepaid postage on it. Please place it in the mailbox as  soon as possible. Your physician should have your test results approximately 7 days after the  monitor has been mailed back to [facility].  Call [facility] Customer Care at [phone] if you have questions regarding  your ZIO XT patch monitor. Call them immediately if you see an orange light blinking on your  monitor.  If your monitor falls off in less than 4 days, contact our Monitor department at [phone].  If your monitor becomes loose or falls off after 4 days call [employer] at [phone] for  suggestions on securing your monitor   Follow-Up: At [facility], you and your health needs are our priority.  As part of our continuing mission to provide you with exceptional heart care, our providers are all part of one team.  This team includes your primary Cardiologist (physician) and Advanced Practice Providers or APPs (Physician Assistants and Nurse Practitioners) who all work together to provide you with the care you need, when you need it.  Your next appointment:   1 month(s)  Provider:   Glendia Ferrier PA    We recommend signing up for the patient portal called MyChart.  Sign up information  is provided on this After Visit Summary.  MyChart is used to connect with patients for Virtual Visits (Telemedicine).  Patients are able to view lab/test results, encounter notes, upcoming appointments, etc.  Non-urgent messages can be sent to your provider as well.   To learn more about what you can do with MyChart, go to [email or web address].   Other Instructions DO NOT STOP YOUR ASPIRIN  PRIOR TO YOUR PROCEDURE

## 2024-11-01 NOTE — Telephone Encounter (Signed)
 STAT if HR is under 50 or over 120 (normal HR is 60-100 beats per minute)  What is your heart rate? 78  Do you have a log of your heart rate readings (document readings)? No   Do you have any other symptoms? No    Pts hr jumping from low 60s to 90s with pulsometer. Patients pcp recommended she see cardiology. Please advise.

## 2024-11-02 DIAGNOSIS — H2511 Age-related nuclear cataract, right eye: Secondary | ICD-10-CM | POA: Diagnosis not present

## 2024-11-02 DIAGNOSIS — H2512 Age-related nuclear cataract, left eye: Secondary | ICD-10-CM | POA: Diagnosis not present

## 2024-11-02 DIAGNOSIS — H5371 Glare sensitivity: Secondary | ICD-10-CM | POA: Diagnosis not present

## 2024-11-06 NOTE — Telephone Encounter (Signed)
 Pt would like to know if he should be seen immediately by Glendia Ferrier or wait until his heart monitor results are back.

## 2024-11-06 NOTE — Telephone Encounter (Signed)
 It looks like he was originally scheduled with me for tomorrow. But was then scheduled with and seen by Miriam Shams, NP on 11/6. Monitor was placed and plan was to follow up in 1 month. Therefore the 11/12 appt was canceled. Kenzie cleared him to proceed with his surgery.   Has something changed since he saw Kenzie?  If no, would make sure he has follow up with me or Dr. Wonda in 4-6 weeks. Glendia Ferrier, PA-C    11/06/2024 2:24 PM

## 2024-11-07 ENCOUNTER — Ambulatory Visit: Admitting: Physician Assistant

## 2024-11-08 NOTE — Telephone Encounter (Signed)
 Called pt to relay Scott's message. No changes, pt will keep appointment.

## 2024-11-16 DIAGNOSIS — H2511 Age-related nuclear cataract, right eye: Secondary | ICD-10-CM | POA: Diagnosis not present

## 2024-11-16 DIAGNOSIS — H25041 Posterior subcapsular polar age-related cataract, right eye: Secondary | ICD-10-CM | POA: Diagnosis not present

## 2024-11-16 DIAGNOSIS — H25011 Cortical age-related cataract, right eye: Secondary | ICD-10-CM | POA: Diagnosis not present

## 2024-11-16 DIAGNOSIS — H5371 Glare sensitivity: Secondary | ICD-10-CM | POA: Diagnosis not present

## 2024-11-17 DIAGNOSIS — R001 Bradycardia, unspecified: Secondary | ICD-10-CM | POA: Diagnosis not present

## 2024-11-19 ENCOUNTER — Other Ambulatory Visit

## 2024-11-25 DIAGNOSIS — R001 Bradycardia, unspecified: Secondary | ICD-10-CM | POA: Diagnosis not present

## 2024-11-26 ENCOUNTER — Ambulatory Visit: Payer: Self-pay | Admitting: Emergency Medicine

## 2024-12-03 NOTE — Progress Notes (Unsigned)
 OFFICE NOTE:    Date:  12/04/2024  ID:  Ronnie Vazquez, DOB 1946-12-06, MRN [id number] PCP: Ozell Heron HERO, MD  [facility] Providers Cardiologist:  Ozell Fell, MD        Coronary artery disease S/p complex bifurcational PCI with 3 x 30 mm DES to the pLAD and POBA to the D1 (thru stent struts) 08/2018 Clopidogrel  ? to Ticagrelor  due to tinnitus (no resolution) On ASA alone after 6 mos MPI 07/30/2019:EF 60, no ischemia or scar, low risk  TTE 09/12/2020: EF 60-65, no RWMA, normal PASP, trivial MR, SoV 37 mm MPI 12/13/2022: No ischemia or infarction, EF 61, low risk TTE 06/16/2023: EF 55-60, no RWMA, mild concentric LVH, GR 1 DD, normal RVSF, moderate LAE, mild RAE, trivial MR, AV sclerosis, aortic root 38 mm, ascending aorta 39 mm Atrial Flutter CHA2DS2-VASc=2 (CAD, age x 1)  S/p CTI ablation 09/2020 (Dr. Fernande) Monitor 05/2023: No A-fib/flutter Monitor 10/2024: NSR, Avg HR 68, occ SV beats, rare PVCs; no heart block, AF or VT Hyperlipidemia  Intol of statins, PCSK9i, Bempedoic Acid  On Ezetimibe   Tinnitus  ?? Hx of TIA in 2014 >> visual disturbance; eval by Neuro >> No TIA Carotid Artery Dz  US  8/19: Bilateral ICA 1-39       Discussed the use of AI scribe software for clinical note transcription with the patient, who gave verbal consent to proceed. History of Present Illness Ronnie Vazquez is a 78 y.o. male for follow up of bradycardia. He was last seen in clinic by Miriam Shams, NP in 10/2024. Pt noted HR in the 40s on pulse ox. EKG with PCP showed HR 72. EKG in our office showed HR 72. Follow up monitor showed NSR with Avg HR 68.   He is here alone. He has been doing well since last seen. No symptoms of syncope or near syncope were reported. He experiences some balance issues since his recent cataract surgery, which he attributes to visual changes. He has a history of elevated blood pressure, which he manages by taking amlodipine  2.5 mg approximately once every two  months when his systolic blood pressure exceeds 140 mmHg. His blood pressure typically returns to normal within hours after taking the medication. No chest pain, shortness of breath, or passing out.    ROS-See HPI    Studies Reviewed:      LABS 07/16/24: Apo B 89, LDL 120, TC 193, HDL 39.60, Trig 169         Physical Exam:  VS:  BP 119/73 (BP Location: Left Arm, Patient Position: Sitting, Cuff Size: Normal)   Pulse 95   Resp 16   Ht 5' 10 (1.778 m)   Wt 183 lb (83 kg)   SpO2 95%   BMI 26.26 kg/m        Wt Readings from Last 3 Encounters:  12/04/24 183 lb (83 kg)  11/01/24 180 lb 9.6 oz (81.9 kg)  10/30/24 183 lb 11.2 oz (83.3 kg)    Constitutional:      Appearance: Healthy appearance. Not in distress.  Pulmonary:     Breath sounds: Normal breath sounds. No wheezing. No rales.  Cardiovascular:     Normal rate. Regular rhythm.     Murmurs: There is no murmur.  Edema:    Peripheral edema absent.       Assessment and Plan:    Assessment & Plan Coronary artery disease involving native coronary artery of native heart without angina pectoris History of  complex bifurcational PCI with DES to the LAD and angioplasty to D1 in September 2019. Nuclear stress test in December 2023 was negative for ischemia. Echocardiogram in June 2024 showed EF 55-60% with grade 1 diastolic dysfunction. No anginal symptoms reported. - Continue aspirin  81 mg daily. - Continue Zetia  10 mg daily. Typical atrial flutter (HCC) Atrial flutter status post CTI ablation in October 2021. No atrial fibrillation or atrial flutter detected on recent monitoring. - Continue current management. Essential hypertension Occasional elevations in blood pressure managed with amlodipine  2.5 mg as needed, approximately once every two months. Blood pressure generally well-controlled with readings around 119-125 mmHg systolic without meds. - Continue amlodipine  2.5 mg as needed for systolic blood pressure sustained above  150 mmHg. - If amlodipine  is needed more frequently, will consider daily dosing. Mixed hyperlipidemia Intolerant to statins and PCSK9 inhibitors. Continues on Zetia  10 mg daily. - Continue Zetia  10 mg daily. Bradycardia Slow HRs noted on pulse ox recently. Follow up EKG and event monitor showed normal HRs.  - No further testing for bradycardia needed at this time.        Dispo:  Return in about 6 months (around 06/04/2025) for Routine Follow Up, w/ Dr. Wonda.  Signed, Glendia Ferrier, PA-C

## 2024-12-03 NOTE — Assessment & Plan Note (Signed)
 Atrial flutter status post CTI ablation in October 2021. No atrial fibrillation or atrial flutter detected on recent monitoring. - Continue current management.

## 2024-12-04 ENCOUNTER — Ambulatory Visit: Attending: Physician Assistant | Admitting: Physician Assistant

## 2024-12-04 ENCOUNTER — Encounter: Payer: Self-pay | Admitting: Physician Assistant

## 2024-12-04 VITALS — BP 119/73 | HR 95 | Resp 16 | Ht 70.0 in | Wt 183.0 lb

## 2024-12-04 DIAGNOSIS — R001 Bradycardia, unspecified: Secondary | ICD-10-CM

## 2024-12-04 DIAGNOSIS — I483 Typical atrial flutter: Secondary | ICD-10-CM

## 2024-12-04 DIAGNOSIS — E782 Mixed hyperlipidemia: Secondary | ICD-10-CM

## 2024-12-04 DIAGNOSIS — I10 Essential (primary) hypertension: Secondary | ICD-10-CM

## 2024-12-04 DIAGNOSIS — I2510 Atherosclerotic heart disease of native coronary artery without angina pectoris: Secondary | ICD-10-CM | POA: Diagnosis not present

## 2024-12-04 NOTE — Assessment & Plan Note (Signed)
 Intolerant to statins and PCSK9 inhibitors. Continues on Zetia  10 mg daily. - Continue Zetia  10 mg daily.

## 2024-12-04 NOTE — Assessment & Plan Note (Signed)
 Occasional elevations in blood pressure managed with amlodipine  2.5 mg as needed, approximately once every two months. Blood pressure generally well-controlled with readings around 119-125 mmHg systolic without meds. - Continue amlodipine  2.5 mg as needed for systolic blood pressure sustained above 150 mmHg. - If amlodipine  is needed more frequently, will consider daily dosing.

## 2024-12-04 NOTE — Patient Instructions (Signed)
 Medication Instructions:  NO CHANGES *If you need a refill on your cardiac medications before your next appointment, please call your pharmacy*  Lab Work: NO LABS If you have labs (blood work) drawn today and your tests are completely normal, you will receive your results only by: MyChart Message (if you have MyChart) OR A paper copy in the mail If you have any lab test that is abnormal or we need to change your treatment, we will call you to review the results.  Testing/Procedures: NO TESTING  Follow-Up: At [facility], you and your health needs are our priority.  As part of our continuing mission to provide you with exceptional heart care, our providers are all part of one team.  This team includes your primary Cardiologist (physician) and Advanced Practice Providers or APPs (Physician Assistants and Nurse Practitioners) who all work together to provide you with the care you need, when you need it.  Your next appointment:   6 month(s)  Provider:   Ozell Fell, MD

## 2024-12-31 DIAGNOSIS — G8929 Other chronic pain: Secondary | ICD-10-CM

## 2025-01-01 ENCOUNTER — Telehealth: Payer: Self-pay

## 2025-01-01 NOTE — Telephone Encounter (Signed)
 Copied from CRM [id number]. Topic: Clinical - Request for Lab/Test Order >> Jan 01, 2025  3:39 PM Rea ORN wrote: Reason for CRM: Pt called to see when xray will ordered. (See yesterday's MyChart encounter).  Please call back to advise,   [phone] >> Jan 01, 2025  3:41 PM Rea ORN wrote: Routed to [employer] in error.

## 2025-01-02 NOTE — Telephone Encounter (Signed)
 See Mychart message in which PCP advised and patient is aware to call the office to schedule an appt.

## 2025-01-04 ENCOUNTER — Ambulatory Visit

## 2025-01-04 ENCOUNTER — Ambulatory Visit: Admitting: Family Medicine

## 2025-01-04 ENCOUNTER — Other Ambulatory Visit

## 2025-01-04 DIAGNOSIS — G8929 Other chronic pain: Secondary | ICD-10-CM

## 2025-01-04 DIAGNOSIS — M25571 Pain in right ankle and joints of right foot: Secondary | ICD-10-CM

## 2025-01-14 ENCOUNTER — Ambulatory Visit: Payer: Self-pay | Admitting: Family Medicine

## 2025-01-14 DIAGNOSIS — G8929 Other chronic pain: Secondary | ICD-10-CM

## 2025-01-18 ENCOUNTER — Ambulatory Visit (INDEPENDENT_AMBULATORY_CARE_PROVIDER_SITE_OTHER): Admitting: Podiatry

## 2025-01-18 DIAGNOSIS — M65971 Unspecified synovitis and tenosynovitis, right ankle and foot: Secondary | ICD-10-CM | POA: Diagnosis not present

## 2025-01-18 MED ORDER — PREDNISONE 5 MG PO TABS: ORAL_TABLET | ORAL | 1 refills | Status: AC

## 2025-01-18 NOTE — Progress Notes (Signed)
 Patient presents with implant along the medial ankle on the right foot.  Is been bothering him for about a year now.  Sometimes worse than others today is not feeling too bad just a little bit of soreness.  He played golf yesterday and was getting pain with any redness.  Says little swollen at times.  Does not recall any injury to it.  Has not noticed any ecchymosis or redness.   Physical exam:  General appearance: Pleasant, and in no acute distress. AOx3.  Vascular: Pedal pulses: DP 2/4 bilaterally, PT 2/4 bilaterally.  Mild edema lower legs bilaterally. Capillary fill time immediate bilaterally.  Neurological: Light touch intact feet bilaterally.  Normal Achilles reflex bilaterally.  No clonus or spasticity noted.   Dermatologic:   Skin normal temperature bilaterally.  Skin normal color, tone, and texture bilaterally.   Musculoskeletal: Tenderness along medial malleolus along posterior tibial tendon right from the malleolus to the navicular tuberosity.  Some tenderness with inversion against resistance.  Normal muscle strength lower extremity bilaterally.  No tenderness along the ankle joint proper.  No tenderness in the sinus tarsi right.  Radiographs: Review of radiographs ankle from January 04, 2025.  Mild anterior impingement.  No evidence of fractures or dislocations.  Diagnosis: 1.  Posterior tibial tenosynovitis right  Plan: -New patient office visit for evaluation and management level 3. --Discussed with him posterior tibial tenosynovitis.  He is already wearing good Brooks shoes with some insoles.  Continue the can also use diclofenac gel on the knees.  Recommended icing as an hour several times a day.  Avoid going barefoot or wearing flat soled shoes.  Will prescription for prednisone  taper.  - Rx prednisone  5 mg, 30 mg p.o. daily first day, then decrease by 5 mg every other day for 12 days.    Return 2 weeks follow-up PTT tendinitis

## 2025-01-23 ENCOUNTER — Other Ambulatory Visit: Payer: Self-pay | Admitting: Cardiovascular Disease

## 2025-05-22 ENCOUNTER — Ambulatory Visit
